# Patient Record
Sex: Male | Born: 1940 | Race: White | Hispanic: No | Marital: Married | Smoking: Never smoker
Health system: Southern US, Community
[De-identification: ages and names within clinical notes are randomized; demographics above are authoritative.]

## PROBLEM LIST (undated history)

## (undated) DIAGNOSIS — I10 Essential (primary) hypertension: Secondary | ICD-10-CM

## (undated) DIAGNOSIS — I4891 Unspecified atrial fibrillation: Secondary | ICD-10-CM

## (undated) DIAGNOSIS — G20C Parkinsonism, unspecified: Secondary | ICD-10-CM

## (undated) DIAGNOSIS — L130 Dermatitis herpetiformis: Secondary | ICD-10-CM

## (undated) DIAGNOSIS — R1310 Dysphagia, unspecified: Secondary | ICD-10-CM

## (undated) DIAGNOSIS — E538 Deficiency of other specified B group vitamins: Secondary | ICD-10-CM

## (undated) DIAGNOSIS — I2510 Atherosclerotic heart disease of native coronary artery without angina pectoris: Secondary | ICD-10-CM

## (undated) DIAGNOSIS — Z950 Presence of cardiac pacemaker: Secondary | ICD-10-CM

## (undated) DIAGNOSIS — G20 Parkinson's disease: Secondary | ICD-10-CM

## (undated) DIAGNOSIS — C61 Malignant neoplasm of prostate: Secondary | ICD-10-CM

## (undated) HISTORY — PX: PACEMAKER PLACEMENT: SHX43

## (undated) HISTORY — PX: CARDIAC CATHETERIZATION: SHX172

## (undated) HISTORY — PX: APPENDECTOMY: SHX54

## (undated) HISTORY — PX: COLONOSCOPY: SHX5424

---

## 2004-01-23 HISTORY — PX: PACEMAKER PLACEMENT: SHX43

## 2004-07-04 DIAGNOSIS — Z950 Presence of cardiac pacemaker: Secondary | ICD-10-CM | POA: Insufficient documentation

## 2004-09-12 ENCOUNTER — Ambulatory Visit: Payer: Self-pay | Admitting: Cardiology

## 2004-10-04 ENCOUNTER — Other Ambulatory Visit: Payer: Self-pay

## 2004-10-04 ENCOUNTER — Ambulatory Visit: Payer: Self-pay | Admitting: Cardiology

## 2006-04-25 ENCOUNTER — Ambulatory Visit: Payer: Self-pay | Admitting: Gastroenterology

## 2008-02-09 ENCOUNTER — Ambulatory Visit: Payer: Self-pay

## 2008-03-18 ENCOUNTER — Ambulatory Visit: Payer: Self-pay | Admitting: Unknown Physician Specialty

## 2008-04-02 ENCOUNTER — Ambulatory Visit: Payer: Self-pay | Admitting: Unknown Physician Specialty

## 2008-04-07 ENCOUNTER — Ambulatory Visit: Payer: Self-pay | Admitting: Unknown Physician Specialty

## 2010-01-03 ENCOUNTER — Ambulatory Visit: Payer: Self-pay | Admitting: General Surgery

## 2010-01-10 ENCOUNTER — Ambulatory Visit: Payer: Self-pay | Admitting: General Surgery

## 2011-08-08 ENCOUNTER — Ambulatory Visit: Payer: Self-pay | Admitting: Urology

## 2011-08-13 ENCOUNTER — Ambulatory Visit: Payer: Self-pay | Admitting: Urology

## 2012-01-23 DIAGNOSIS — C61 Malignant neoplasm of prostate: Secondary | ICD-10-CM

## 2012-01-23 HISTORY — DX: Malignant neoplasm of prostate: C61

## 2012-02-12 ENCOUNTER — Ambulatory Visit: Payer: Self-pay | Admitting: Gastroenterology

## 2012-02-12 LAB — PROTIME-INR: INR: 1

## 2012-02-14 ENCOUNTER — Ambulatory Visit: Payer: Self-pay | Admitting: Cardiology

## 2012-02-14 DIAGNOSIS — I4891 Unspecified atrial fibrillation: Secondary | ICD-10-CM

## 2012-02-14 LAB — CBC WITH DIFFERENTIAL/PLATELET
Basophil #: 0.1 10*3/uL (ref 0.0–0.1)
Basophil %: 0.9 %
Eosinophil #: 0.1 10*3/uL (ref 0.0–0.7)
Eosinophil %: 1.8 %
Lymphocyte #: 1.6 10*3/uL (ref 1.0–3.6)
Lymphocyte %: 22.6 %
MCH: 29.3 pg (ref 26.0–34.0)
MCHC: 33.9 g/dL (ref 32.0–36.0)
MCV: 86 fL (ref 80–100)
Monocyte #: 0.6 x10 3/mm (ref 0.2–1.0)
Neutrophil %: 65.9 %
RDW: 13.8 % (ref 11.5–14.5)
WBC: 6.9 10*3/uL (ref 3.8–10.6)

## 2012-02-14 LAB — APTT: Activated PTT: 31.1 secs (ref 23.6–35.9)

## 2012-02-14 LAB — BASIC METABOLIC PANEL
BUN: 16 mg/dL (ref 7–18)
Calcium, Total: 8.7 mg/dL (ref 8.5–10.1)
Chloride: 107 mmol/L (ref 98–107)
Glucose: 86 mg/dL (ref 65–99)
Osmolality: 280 (ref 275–301)
Sodium: 140 mmol/L (ref 136–145)

## 2012-02-14 LAB — PROTIME-INR
INR: 1.1
Prothrombin Time: 14.2 secs (ref 11.5–14.7)

## 2012-02-18 ENCOUNTER — Ambulatory Visit: Payer: Self-pay | Admitting: Cardiology

## 2012-09-10 ENCOUNTER — Encounter: Payer: Self-pay | Admitting: Neurology

## 2012-09-22 ENCOUNTER — Encounter: Payer: Self-pay | Admitting: Neurology

## 2012-10-22 ENCOUNTER — Encounter: Payer: Self-pay | Admitting: Neurology

## 2013-04-27 DIAGNOSIS — I4891 Unspecified atrial fibrillation: Secondary | ICD-10-CM | POA: Diagnosis present

## 2014-02-02 DIAGNOSIS — D400 Neoplasm of uncertain behavior of prostate: Secondary | ICD-10-CM | POA: Diagnosis not present

## 2014-02-02 DIAGNOSIS — C61 Malignant neoplasm of prostate: Secondary | ICD-10-CM | POA: Diagnosis not present

## 2014-02-03 DIAGNOSIS — C61 Malignant neoplasm of prostate: Secondary | ICD-10-CM | POA: Diagnosis not present

## 2014-02-10 DIAGNOSIS — E538 Deficiency of other specified B group vitamins: Secondary | ICD-10-CM | POA: Diagnosis not present

## 2014-03-15 DIAGNOSIS — E538 Deficiency of other specified B group vitamins: Secondary | ICD-10-CM | POA: Diagnosis not present

## 2014-04-06 DIAGNOSIS — I10 Essential (primary) hypertension: Secondary | ICD-10-CM | POA: Diagnosis not present

## 2014-04-06 DIAGNOSIS — H25813 Combined forms of age-related cataract, bilateral: Secondary | ICD-10-CM | POA: Diagnosis not present

## 2014-04-06 DIAGNOSIS — H4011X1 Primary open-angle glaucoma, mild stage: Secondary | ICD-10-CM | POA: Diagnosis not present

## 2014-04-13 DIAGNOSIS — E538 Deficiency of other specified B group vitamins: Secondary | ICD-10-CM | POA: Diagnosis not present

## 2014-05-03 DIAGNOSIS — Z0000 Encounter for general adult medical examination without abnormal findings: Secondary | ICD-10-CM | POA: Diagnosis not present

## 2014-05-03 DIAGNOSIS — E538 Deficiency of other specified B group vitamins: Secondary | ICD-10-CM | POA: Diagnosis not present

## 2014-05-03 DIAGNOSIS — I480 Paroxysmal atrial fibrillation: Secondary | ICD-10-CM | POA: Diagnosis not present

## 2014-05-03 DIAGNOSIS — I2510 Atherosclerotic heart disease of native coronary artery without angina pectoris: Secondary | ICD-10-CM | POA: Diagnosis not present

## 2014-05-04 DIAGNOSIS — I2510 Atherosclerotic heart disease of native coronary artery without angina pectoris: Secondary | ICD-10-CM | POA: Diagnosis not present

## 2014-05-04 DIAGNOSIS — I480 Paroxysmal atrial fibrillation: Secondary | ICD-10-CM | POA: Diagnosis not present

## 2014-05-04 DIAGNOSIS — E538 Deficiency of other specified B group vitamins: Secondary | ICD-10-CM | POA: Diagnosis not present

## 2014-05-04 DIAGNOSIS — Z0000 Encounter for general adult medical examination without abnormal findings: Secondary | ICD-10-CM | POA: Diagnosis not present

## 2014-05-11 NOTE — Op Note (Signed)
PATIENT NAME:  Daniel [name], Daniel [name] MR#:  [id number] DATE OF BIRTH:  09/30/1940  DATE OF PROCEDURE:  08/13/2011  PREOPERATIVE DIAGNOSIS: Benign prostatic hypertrophy with bladder outlet obstruction.   POSTOPERATIVE DIAGNOSIS: Benign prostatic hypertrophy with bladder outlet obstruction.   PROCEDURE PERFORMED: Photovaporization of the prostate with the GreenLight laser.   SURGEON: Otelia Limes. Yves Dill, MD  ANESTHETIST: [facility]  ANESTHESIA: General.   INDICATIONS: See the dictated history and physical. After informed consent patient requests above procedure.   OPERATIVE SUMMARY: After adequate general anesthesia had been obtained, patient was placed into dorsal lithotomy position and the perineum was prepped and draped in the usual fashion. The laser scope was coupled with the camera and then visually advanced into the bladder. Bladder was moderately trabeculated. No bladder tumors were identified. Patient was noted to have trilobar benign prostatic hypertrophy with visual obstruction. At this point the [facility] GreenLight laser fiber was introduced through the scope and vaporization was begun at a setting of 80 watts. The bladder neck tissue was fully vaporized. Power was then increased up to 120 watts and midportion of the prostatic tissue was vaporized to the level of the verumontanum. Finally the power was increased up to 180 watts and remaining obstructive tissue was vaporized. At this point scope was removed and a 20 [place] Foley catheter was inserted. Catheter was irrigated until clear. A B and O suppository was placed. Procedure was then terminated and the patient was transferred to the recovery room in stable condition.  ____________________________ Otelia Limes. Yves Dill, MD mrw:cms D: 08/13/2011 14:07:13 ET T: 08/13/2011 14:19:45 ET  JOB#: [id number] cc: Otelia Limes. Yves Dill, MD, <Dictator>  Royston Cowper MD ELECTRONICALLY SIGNED 08/13/2011 17:56

## 2014-05-11 NOTE — H&P (Signed)
PATIENT NAME:  Daniel [name], Daniel [name] MR#:  [id number] DATE OF BIRTH:  Apr 19, 1940  DATE OF ADMISSION:  08/13/2011  CHIEF COMPLAINT: Difficulty voiding.    HISTORY OF PRESENT ILLNESS: Daniel [name] is a 74 year old Caucasian male with a long history of benign prostatic hypertrophy and lower urinary tract symptoms which have worsened recently. AUA symptom score 16 with a quality of life score of 4. He was found to have an elevated PSA of 4.85 and subsequently underwent ultrasound-guided needle biopsy of the prostate June 20th and was found to have an 84 gram prostate with Gleason's grade 3 + 3 adenocarcinoma involving 2 out of 12 core biopsies. He comes in now for photovaporization of the prostate with the green light laser.   ALLERGIES: No drug allergies.   CURRENT MEDICATIONS:  1. Dapsone. 2. Metoprolol. 3. Aspirin.  4. Jalyn.   PAST SURGICAL HISTORY:  1. Appendectomy in 1961. 2. Repair of a deviated septum in 1977. 3. Pacemaker placement in 2006.  4. Left shoulder injury repair in 2009. 5. Bilateral inguinal herniorrhaphies in 2011.   SOCIAL HISTORY: He denied tobacco use. He consumes 1 to 4 alcoholic beverages per week.   FAMILY HISTORY: Noncontributory.   PAST AND CURRENT MEDICAL CONDITION.  1. Hypertension. 2. Dermatitis herpetiformis. 3. Atrial fibrillation status post pacemaker placement. 4. Coronary artery disease.   REVIEW OF SYSTEMS: The patient denies chest pain, shortness of breath, diabetes, or stroke.   PHYSICAL EXAMINATION:   GENERAL: Well nourished white male in no acute distress.   HEENT: Sclerae were clear. Pupils were equal, round, reactive to light and accommodation. Extraocular movements were intact.   NECK: Supple. No palpable adenopathy. No audible carotid bruits.   LUNGS: Clear to auscultation.   HEART: Regular rhythm and rate without audible murmurs.   ABDOMEN: Soft, nontender abdomen.   GENITOURINARY: Circumcised. Testes were atrophic.   RECTAL: Greater  than 75 gram smooth nontender prostate.   NEUROMUSCULAR: Nonfocal.   IMPRESSION:  1. Bladder outlet obstruction.  2. Focal prostate cancer.   PLAN: Photovaporization of prostate with green light laser.   ____________________________ Daniel [name]. Yves Dill, MD mrw:drc D: 08/08/2011 13:08:08 ET T: 08/08/2011 13:22:44 ET JOB#: [id number]  cc: Daniel [name]. Yves Dill, MD, <Dictator> Royston Cowper MD ELECTRONICALLY SIGNED 08/08/2011 16:58

## 2014-05-14 NOTE — Op Note (Signed)
PATIENT NAME:  TALIK, CASIQUE MR#:  [id number] DATE OF BIRTH:  Nov 03, 1940  DATE OF PROCEDURE:  02/18/2012  PRIMARY CARE PHYSICIAN: Maryland Pink, MD  PREPROCEDURE DIAGNOSES:  1. Sick sinus syndrome. 2. Elective replacement indication.  PROCEDURE: Dual-chamber pacemaker generator change-out.  POSTPROCEDURE DIAGNOSIS: Intermittent ventricular pacing.   INDICATION: The patient is a 74 year old gentleman status post dual-chamber pacemaker 10/04/2004 for sick sinus syndrome. Recent pacemaker interrogation showed pacemaker was elective replacement indication.   DESCRIPTION OF PROCEDURE: The risks, benefits and alternatives of pacemaker generator change-out were explained to the patient and informed written consent was obtained. He was brought to the operating room in a fasting state. The left pectoral region was prepped and draped in the usual sterile manner. Anesthesia was obtain with 1% Xylocaine locally. A 6 cm incision was performed over the left pectoral region. The old pacemaker generator was retrieved by electrocautery and blunt dissection. The leads were disconnected to the old pacemaker generator and interrogated. After proper thresholds were obtained, the leads were connected to a new dual-chamber rate-responsive pacemaker generator (Adapta [id number]). The pacemaker pocket was irrigated with gentamicin solution. The pacemaker pocket was closed with 2-0 and 4-0 Vicryl, respectively. Steri-Strips and pressure dressing were applied. ____________________________ Isaias Cowman, MD ap:sb D: 02/18/2012 12:58:06 ET T: 02/18/2012 14:00:05 ET JOB#: [id number]  cc: Isaias Cowman, MD, <Dictator> Isaias Cowman MD ELECTRONICALLY SIGNED 03/18/2012 14:57

## 2014-06-01 DIAGNOSIS — I471 Supraventricular tachycardia: Secondary | ICD-10-CM | POA: Diagnosis not present

## 2014-06-30 DIAGNOSIS — Z950 Presence of cardiac pacemaker: Secondary | ICD-10-CM | POA: Diagnosis not present

## 2014-06-30 DIAGNOSIS — I2510 Atherosclerotic heart disease of native coronary artery without angina pectoris: Secondary | ICD-10-CM | POA: Diagnosis not present

## 2014-06-30 DIAGNOSIS — Z45018 Encounter for adjustment and management of other part of cardiac pacemaker: Secondary | ICD-10-CM | POA: Diagnosis not present

## 2014-06-30 DIAGNOSIS — I480 Paroxysmal atrial fibrillation: Secondary | ICD-10-CM | POA: Diagnosis not present

## 2014-07-05 DIAGNOSIS — H25813 Combined forms of age-related cataract, bilateral: Secondary | ICD-10-CM | POA: Diagnosis not present

## 2014-07-05 DIAGNOSIS — H52223 Regular astigmatism, bilateral: Secondary | ICD-10-CM | POA: Diagnosis not present

## 2014-07-05 DIAGNOSIS — H4011X1 Primary open-angle glaucoma, mild stage: Secondary | ICD-10-CM | POA: Diagnosis not present

## 2014-07-05 DIAGNOSIS — H524 Presbyopia: Secondary | ICD-10-CM | POA: Diagnosis not present

## 2014-07-05 DIAGNOSIS — I10 Essential (primary) hypertension: Secondary | ICD-10-CM | POA: Diagnosis not present

## 2014-07-05 DIAGNOSIS — H5213 Myopia, bilateral: Secondary | ICD-10-CM | POA: Diagnosis not present

## 2014-08-10 DIAGNOSIS — R351 Nocturia: Secondary | ICD-10-CM | POA: Diagnosis not present

## 2014-08-10 DIAGNOSIS — C61 Malignant neoplasm of prostate: Secondary | ICD-10-CM | POA: Diagnosis not present

## 2014-08-10 DIAGNOSIS — D400 Neoplasm of uncertain behavior of prostate: Secondary | ICD-10-CM | POA: Diagnosis not present

## 2014-08-11 DIAGNOSIS — R17 Unspecified jaundice: Secondary | ICD-10-CM | POA: Diagnosis not present

## 2014-09-29 DIAGNOSIS — X32XXXA Exposure to sunlight, initial encounter: Secondary | ICD-10-CM | POA: Diagnosis not present

## 2014-09-29 DIAGNOSIS — R202 Paresthesia of skin: Secondary | ICD-10-CM | POA: Diagnosis not present

## 2014-09-29 DIAGNOSIS — D485 Neoplasm of uncertain behavior of skin: Secondary | ICD-10-CM | POA: Diagnosis not present

## 2014-09-29 DIAGNOSIS — L570 Actinic keratosis: Secondary | ICD-10-CM | POA: Diagnosis not present

## 2014-09-29 DIAGNOSIS — L130 Dermatitis herpetiformis: Secondary | ICD-10-CM | POA: Diagnosis not present

## 2014-09-29 DIAGNOSIS — C44319 Basal cell carcinoma of skin of other parts of face: Secondary | ICD-10-CM | POA: Diagnosis not present

## 2014-09-29 DIAGNOSIS — Z79899 Other long term (current) drug therapy: Secondary | ICD-10-CM | POA: Diagnosis not present

## 2014-10-05 DIAGNOSIS — H5213 Myopia, bilateral: Secondary | ICD-10-CM | POA: Diagnosis not present

## 2014-10-05 DIAGNOSIS — H52223 Regular astigmatism, bilateral: Secondary | ICD-10-CM | POA: Diagnosis not present

## 2014-10-05 DIAGNOSIS — H524 Presbyopia: Secondary | ICD-10-CM | POA: Diagnosis not present

## 2014-10-05 DIAGNOSIS — I10 Essential (primary) hypertension: Secondary | ICD-10-CM | POA: Diagnosis not present

## 2014-10-05 DIAGNOSIS — H4011X1 Primary open-angle glaucoma, mild stage: Secondary | ICD-10-CM | POA: Diagnosis not present

## 2014-10-05 DIAGNOSIS — H25813 Combined forms of age-related cataract, bilateral: Secondary | ICD-10-CM | POA: Diagnosis not present

## 2014-11-22 DIAGNOSIS — C44319 Basal cell carcinoma of skin of other parts of face: Secondary | ICD-10-CM | POA: Diagnosis not present

## 2014-11-30 DIAGNOSIS — I471 Supraventricular tachycardia: Secondary | ICD-10-CM | POA: Diagnosis not present

## 2014-12-29 DIAGNOSIS — Z9889 Other specified postprocedural states: Secondary | ICD-10-CM | POA: Diagnosis not present

## 2014-12-29 DIAGNOSIS — I480 Paroxysmal atrial fibrillation: Secondary | ICD-10-CM | POA: Diagnosis not present

## 2014-12-29 DIAGNOSIS — Z950 Presence of cardiac pacemaker: Secondary | ICD-10-CM | POA: Diagnosis not present

## 2014-12-29 DIAGNOSIS — Z45018 Encounter for adjustment and management of other part of cardiac pacemaker: Secondary | ICD-10-CM | POA: Diagnosis not present

## 2015-01-03 DIAGNOSIS — H52223 Regular astigmatism, bilateral: Secondary | ICD-10-CM | POA: Diagnosis not present

## 2015-01-03 DIAGNOSIS — I10 Essential (primary) hypertension: Secondary | ICD-10-CM | POA: Diagnosis not present

## 2015-01-03 DIAGNOSIS — H25813 Combined forms of age-related cataract, bilateral: Secondary | ICD-10-CM | POA: Diagnosis not present

## 2015-01-03 DIAGNOSIS — H401131 Primary open-angle glaucoma, bilateral, mild stage: Secondary | ICD-10-CM | POA: Diagnosis not present

## 2015-01-03 DIAGNOSIS — H5213 Myopia, bilateral: Secondary | ICD-10-CM | POA: Diagnosis not present

## 2015-01-03 DIAGNOSIS — H524 Presbyopia: Secondary | ICD-10-CM | POA: Diagnosis not present

## 2015-02-22 DIAGNOSIS — L300 Nummular dermatitis: Secondary | ICD-10-CM | POA: Diagnosis not present

## 2015-02-22 DIAGNOSIS — L130 Dermatitis herpetiformis: Secondary | ICD-10-CM | POA: Diagnosis not present

## 2015-02-22 DIAGNOSIS — L821 Other seborrheic keratosis: Secondary | ICD-10-CM | POA: Diagnosis not present

## 2015-04-04 DIAGNOSIS — H524 Presbyopia: Secondary | ICD-10-CM | POA: Diagnosis not present

## 2015-04-04 DIAGNOSIS — I10 Essential (primary) hypertension: Secondary | ICD-10-CM | POA: Diagnosis not present

## 2015-04-04 DIAGNOSIS — H5213 Myopia, bilateral: Secondary | ICD-10-CM | POA: Diagnosis not present

## 2015-04-04 DIAGNOSIS — H25813 Combined forms of age-related cataract, bilateral: Secondary | ICD-10-CM | POA: Diagnosis not present

## 2015-04-04 DIAGNOSIS — H401131 Primary open-angle glaucoma, bilateral, mild stage: Secondary | ICD-10-CM | POA: Diagnosis not present

## 2015-04-04 DIAGNOSIS — H52223 Regular astigmatism, bilateral: Secondary | ICD-10-CM | POA: Diagnosis not present

## 2015-05-31 DIAGNOSIS — I471 Supraventricular tachycardia: Secondary | ICD-10-CM | POA: Diagnosis not present

## 2015-06-14 ENCOUNTER — Other Ambulatory Visit: Payer: Self-pay | Admitting: Family Medicine

## 2015-06-14 DIAGNOSIS — E538 Deficiency of other specified B group vitamins: Secondary | ICD-10-CM | POA: Diagnosis not present

## 2015-06-14 DIAGNOSIS — Z0000 Encounter for general adult medical examination without abnormal findings: Secondary | ICD-10-CM | POA: Diagnosis not present

## 2015-06-14 DIAGNOSIS — R1310 Dysphagia, unspecified: Secondary | ICD-10-CM

## 2015-06-14 DIAGNOSIS — I4891 Unspecified atrial fibrillation: Secondary | ICD-10-CM | POA: Diagnosis not present

## 2015-06-15 DIAGNOSIS — Z0000 Encounter for general adult medical examination without abnormal findings: Secondary | ICD-10-CM | POA: Diagnosis not present

## 2015-06-15 DIAGNOSIS — E538 Deficiency of other specified B group vitamins: Secondary | ICD-10-CM | POA: Diagnosis not present

## 2015-06-15 DIAGNOSIS — I4891 Unspecified atrial fibrillation: Secondary | ICD-10-CM | POA: Diagnosis not present

## 2015-06-22 ENCOUNTER — Ambulatory Visit
Admission: RE | Admit: 2015-06-22 | Discharge: 2015-06-22 | Disposition: A | Discharge status: Home / Self Care | Payer: Commercial Managed Care - HMO | Source: Ambulatory Visit | Attending: Family Medicine | Admitting: Family Medicine

## 2015-06-22 DIAGNOSIS — R1310 Dysphagia, unspecified: Secondary | ICD-10-CM | POA: Insufficient documentation

## 2015-06-24 DIAGNOSIS — E538 Deficiency of other specified B group vitamins: Secondary | ICD-10-CM | POA: Diagnosis not present

## 2015-07-04 DIAGNOSIS — E538 Deficiency of other specified B group vitamins: Secondary | ICD-10-CM | POA: Diagnosis not present

## 2015-07-05 DIAGNOSIS — I480 Paroxysmal atrial fibrillation: Secondary | ICD-10-CM | POA: Diagnosis not present

## 2015-07-05 DIAGNOSIS — Z9889 Other specified postprocedural states: Secondary | ICD-10-CM | POA: Diagnosis not present

## 2015-07-05 DIAGNOSIS — Z950 Presence of cardiac pacemaker: Secondary | ICD-10-CM | POA: Diagnosis not present

## 2015-07-05 DIAGNOSIS — Z45018 Encounter for adjustment and management of other part of cardiac pacemaker: Secondary | ICD-10-CM | POA: Diagnosis not present

## 2015-07-06 DIAGNOSIS — R1310 Dysphagia, unspecified: Secondary | ICD-10-CM | POA: Diagnosis not present

## 2015-07-06 DIAGNOSIS — R490 Dysphonia: Secondary | ICD-10-CM | POA: Diagnosis not present

## 2015-07-11 DIAGNOSIS — I10 Essential (primary) hypertension: Secondary | ICD-10-CM | POA: Diagnosis not present

## 2015-07-11 DIAGNOSIS — E538 Deficiency of other specified B group vitamins: Secondary | ICD-10-CM | POA: Diagnosis not present

## 2015-07-11 DIAGNOSIS — H52223 Regular astigmatism, bilateral: Secondary | ICD-10-CM | POA: Diagnosis not present

## 2015-07-11 DIAGNOSIS — H524 Presbyopia: Secondary | ICD-10-CM | POA: Diagnosis not present

## 2015-07-11 DIAGNOSIS — H401131 Primary open-angle glaucoma, bilateral, mild stage: Secondary | ICD-10-CM | POA: Diagnosis not present

## 2015-07-11 DIAGNOSIS — H5213 Myopia, bilateral: Secondary | ICD-10-CM | POA: Diagnosis not present

## 2015-07-11 DIAGNOSIS — H25813 Combined forms of age-related cataract, bilateral: Secondary | ICD-10-CM | POA: Diagnosis not present

## 2015-07-12 ENCOUNTER — Other Ambulatory Visit: Payer: Self-pay | Admitting: Unknown Physician Specialty

## 2015-07-12 DIAGNOSIS — R1310 Dysphagia, unspecified: Secondary | ICD-10-CM

## 2015-07-12 DIAGNOSIS — R1313 Dysphagia, pharyngeal phase: Secondary | ICD-10-CM | POA: Diagnosis not present

## 2015-07-12 DIAGNOSIS — L130 Dermatitis herpetiformis: Secondary | ICD-10-CM | POA: Diagnosis not present

## 2015-07-18 DIAGNOSIS — E538 Deficiency of other specified B group vitamins: Secondary | ICD-10-CM | POA: Diagnosis not present

## 2015-07-22 ENCOUNTER — Ambulatory Visit
Admission: RE | Admit: 2015-07-22 | Discharge: 2015-07-22 | Disposition: A | Discharge status: Home / Self Care | Payer: Commercial Managed Care - HMO | Source: Ambulatory Visit | Attending: Unknown Physician Specialty | Admitting: Unknown Physician Specialty

## 2015-07-22 DIAGNOSIS — R1310 Dysphagia, unspecified: Secondary | ICD-10-CM

## 2015-07-22 DIAGNOSIS — R1313 Dysphagia, pharyngeal phase: Secondary | ICD-10-CM

## 2015-07-22 NOTE — Therapy (Signed)
[place], [place], [address] Phone: [phone]   Fax:     Modified Barium Swallow  Patient Details  Name: Daniel [name] MRN: [id number] Date of Birth: [birth year] No Data Recorded  Encounter Date: 07/22/2015      End of Session - 07/22/15 1418    Visit Number 1   Number of Visits 1   Date for SLP Re-Evaluation 07/22/15   SLP Start Time 19   SLP Stop Time  1400   SLP Time Calculation (min) 60 min   Activity Tolerance Patient tolerated treatment well      No past medical history on file.  No past surgical history on file.  There were no vitals filed for this visit.   Subjective: Patient behavior: (alertness, ability to follow instructions, etc.): Patient alert, verbal and able to follow directions  Chief complaint: recent barium swallow study positive for laryngeal penetration, report of occasional nasal regurgitation, report of bowed vocal cords, and no known neurological history   Objective:  Radiological Procedure: A videoflouroscopic evaluation of oral-preparatory, reflex initiation, and pharyngeal phases of the swallow was performed; as well as a screening of the upper esophageal phase.  I. POSTURE: Upright in MBS chair  II. VIEW: Lateral  III. COMPENSATORY STRATEGIES: dry swallow, liquid wash, head turn  IV. BOLUSES ADMINISTERED:   Thin Liquid: 2 small sips, 3 rapid consecutive sips   Nectar-thick Liquid: 1 moderate size bolus    Puree: 2 teaspoon presentations   Mechanical Soft: 1/4 graham cracker in applesauce  V. RESULTS OF EVALUATION: A. ORAL PREPARATORY PHASE: (The lips, tongue, and velum are observed for strength and coordination)       **Overall Severity Rating: Within normal limits  B. SWALLOW INITIATION/REFLEX: (The reflex is normal if "triggered" by the time the bolus reached the base of the tongue)  **Overall Severity Rating: Mild; triggers at the  valleculae  C. PHARYNGEAL PHASE: (Pharyngeal function is normal if the bolus shows rapid, smooth, and continuous transit through the pharynx and there is no pharyngeal residue after the swallow)  **Overall Severity Rating: Moderate; decreased anterior hyolaryngeal movement, incomplete epiglottic inversion, and moderate pharyngeal residue (primarily valleculae with minimal residue in the pyriform sinus).   D. LARYNGEAL PENETRATION: (Material entering into the laryngeal inlet/vestibule but not aspirated): None   E. ASPIRATION: None  F. ESOPHAGEAL PHASE: (Screening of the upper esophagus): patient had barium swallow study 06/22/2015  ASSESSMENT: This 75 year old man; with recent barium swallow study positive for laryngeal penetration, report of occasional nasal regurgitation, report of bowed vocal cords, and no known neurological history; is presenting with moderate pharyngeal dysphagia characterized decreased anterior hyolaryngeal movement, incomplete epiglottic inversion, and moderate pharyngeal residue (primarily valleculae with minimal residue in the pyriform sinus). Spontaneous dry swallows and liquid wash reduce but do not clear the residue. The patient demonstrated good velopharyngeal closure during every swallow with no bolus between the soft palate and pharyngeal wall.  There was no observed laryngeal penetration / aspiration during this study; the patient demonstrates excellent airway protection.  The patient does not appear to be at significant risk for prandial aspiration.  The patient would benefit from speech therapy for laryngeal/pharyngeal strengthening exercises as well as high effort/high intensity vocal exercises (which may improve oral tongue / tongue base function during the oral and pharyngeal phases of swallowing as well as improving vocal intensity and reducing hypernasality).    PLAN/RECOMMENDATIONS:  A. Diet: Regular  B. Swallowing Precautions: Standard, monitor for increased  symptoms of oropharyngeal dysphagia / aspiration   C. Recommended consultation to: follow up with Dr. Tami Ribas as scheduled   D. Therapy recommendations: speech therapy for laryngeal/pharyngeal strengthening exercises as well as high effort/high intensity vocal exercises (which may improve oral tongue / tongue base function during the oral and pharyngeal phases of swallowing as well as improving vocal intensity and reducing hypernasality).    E. Results and recommendations were discussed with the patient immediately following the study and final report routed to Dr. Tami Ribas and [place] November, [place].    Patient will benefit from skilled therapeutic intervention in order to improve the following deficits and impairments:   Dysphagia, pharyngeal phase  Dysphagia - Plan: DG OP Swallowing Func-Medicare/Speech Path, DG OP Swallowing Func-Medicare/Speech Path      G-Codes - 07/23/15 1419    Functional Assessment Tool Used MBS, clinical judgment   Functional Limitations Swallowing   Swallow Current Status [id number]) At least 40 percent but less than 60 percent impaired, limited or restricted   Swallow Goal Status [id number]) At least 40 percent but less than 60 percent impaired, limited or restricted   Swallow Discharge Status [id number]) At least 40 percent but less than 60 percent impaired, limited or restricted          Problem List There are no active problems to display for this patient.  Leroy Sea, MS/CCC- SLP  Lou Miner July 23, 2015, 2:20 PM  [facility] [place], [place], [address] Phone: [phone]   Fax:     Name: Daniel [name] MRN: [id number] Date of Birth: Jan 03, 1941

## 2015-07-27 DIAGNOSIS — R490 Dysphonia: Secondary | ICD-10-CM | POA: Diagnosis not present

## 2015-07-27 DIAGNOSIS — R1310 Dysphagia, unspecified: Secondary | ICD-10-CM | POA: Diagnosis not present

## 2015-08-01 DIAGNOSIS — E538 Deficiency of other specified B group vitamins: Secondary | ICD-10-CM | POA: Diagnosis not present

## 2015-08-09 DIAGNOSIS — D400 Neoplasm of uncertain behavior of prostate: Secondary | ICD-10-CM | POA: Diagnosis not present

## 2015-08-09 DIAGNOSIS — R350 Frequency of micturition: Secondary | ICD-10-CM | POA: Diagnosis not present

## 2015-08-09 DIAGNOSIS — C61 Malignant neoplasm of prostate: Secondary | ICD-10-CM | POA: Diagnosis not present

## 2015-08-09 DIAGNOSIS — R351 Nocturia: Secondary | ICD-10-CM | POA: Diagnosis not present

## 2015-08-09 DIAGNOSIS — R3914 Feeling of incomplete bladder emptying: Secondary | ICD-10-CM | POA: Diagnosis not present

## 2015-08-15 DIAGNOSIS — E538 Deficiency of other specified B group vitamins: Secondary | ICD-10-CM | POA: Diagnosis not present

## 2015-10-12 ENCOUNTER — Encounter: Payer: Self-pay | Admitting: *Deleted

## 2015-10-13 ENCOUNTER — Ambulatory Visit: Payer: Commercial Managed Care - HMO | Admitting: Anesthesiology

## 2015-10-13 ENCOUNTER — Encounter
Admission: RE | Disposition: A | Discharge status: Home / Self Care | Payer: Self-pay | Source: Ambulatory Visit | Attending: Gastroenterology

## 2015-10-13 ENCOUNTER — Encounter: Payer: Self-pay | Admitting: *Deleted

## 2015-10-13 ENCOUNTER — Ambulatory Visit
Admission: RE | Admit: 2015-10-13 | Discharge: 2015-10-13 | Disposition: A | Discharge status: Home / Self Care | Payer: Commercial Managed Care - HMO | Source: Ambulatory Visit | Attending: Gastroenterology | Admitting: Gastroenterology

## 2015-10-13 DIAGNOSIS — E538 Deficiency of other specified B group vitamins: Secondary | ICD-10-CM | POA: Insufficient documentation

## 2015-10-13 DIAGNOSIS — Z7982 Long term (current) use of aspirin: Secondary | ICD-10-CM | POA: Diagnosis not present

## 2015-10-13 DIAGNOSIS — Z79899 Other long term (current) drug therapy: Secondary | ICD-10-CM | POA: Insufficient documentation

## 2015-10-13 DIAGNOSIS — R1310 Dysphagia, unspecified: Secondary | ICD-10-CM | POA: Diagnosis not present

## 2015-10-13 DIAGNOSIS — K210 Gastro-esophageal reflux disease with esophagitis: Secondary | ICD-10-CM | POA: Diagnosis not present

## 2015-10-13 DIAGNOSIS — K2940 Chronic atrophic gastritis without bleeding: Secondary | ICD-10-CM | POA: Diagnosis not present

## 2015-10-13 DIAGNOSIS — K2980 Duodenitis without bleeding: Secondary | ICD-10-CM | POA: Diagnosis not present

## 2015-10-13 DIAGNOSIS — K228 Other specified diseases of esophagus: Secondary | ICD-10-CM | POA: Insufficient documentation

## 2015-10-13 DIAGNOSIS — L130 Dermatitis herpetiformis: Secondary | ICD-10-CM | POA: Diagnosis not present

## 2015-10-13 DIAGNOSIS — K3189 Other diseases of stomach and duodenum: Secondary | ICD-10-CM | POA: Diagnosis not present

## 2015-10-13 DIAGNOSIS — Z950 Presence of cardiac pacemaker: Secondary | ICD-10-CM | POA: Insufficient documentation

## 2015-10-13 DIAGNOSIS — I4891 Unspecified atrial fibrillation: Secondary | ICD-10-CM | POA: Diagnosis not present

## 2015-10-13 DIAGNOSIS — K2970 Gastritis, unspecified, without bleeding: Secondary | ICD-10-CM | POA: Diagnosis not present

## 2015-10-13 HISTORY — DX: Unspecified atrial fibrillation: I48.91

## 2015-10-13 HISTORY — PX: ESOPHAGOGASTRODUODENOSCOPY (EGD) WITH PROPOFOL: SHX5813

## 2015-10-13 HISTORY — DX: Deficiency of other specified B group vitamins: E53.8

## 2015-10-13 HISTORY — DX: Dermatitis herpetiformis: L13.0

## 2015-10-13 HISTORY — DX: Dysphagia, unspecified: R13.10

## 2015-10-13 SURGERY — ESOPHAGOGASTRODUODENOSCOPY (EGD) WITH PROPOFOL
Anesthesia: General

## 2015-10-13 MED ORDER — MIDAZOLAM HCL 5 MG/5ML IJ SOLN
INTRAMUSCULAR | Status: DC | PRN
  Administered 2015-10-13: 1 mg via INTRAVENOUS

## 2015-10-13 MED ORDER — LIDOCAINE 2% (20 MG/ML) 5 ML SYRINGE
INTRAMUSCULAR | Status: DC | PRN
  Administered 2015-10-13: 40 mg via INTRAVENOUS

## 2015-10-13 MED ORDER — FENTANYL CITRATE (PF) 100 MCG/2ML IJ SOLN
INTRAMUSCULAR | Status: DC | PRN
  Administered 2015-10-13: 50 ug via INTRAVENOUS

## 2015-10-13 MED ORDER — SODIUM CHLORIDE 0.9 % IV SOLN
INTRAVENOUS | Status: DC
  Administered 2015-10-13: 1000 mL via INTRAVENOUS
  Administered 2015-10-13: 07:00:00 via INTRAVENOUS

## 2015-10-13 MED ORDER — PROPOFOL 500 MG/50ML IV EMUL
INTRAVENOUS | Status: DC | PRN
  Administered 2015-10-13: 140 ug/kg/min via INTRAVENOUS

## 2015-10-13 MED ORDER — SODIUM CHLORIDE 0.9 % IV SOLN: INTRAVENOUS | Status: DC

## 2015-10-13 MED ORDER — PROPOFOL 10 MG/ML IV BOLUS
INTRAVENOUS | Status: DC | PRN
  Administered 2015-10-13: 80 mg via INTRAVENOUS

## 2015-10-13 MED ORDER — PHENYLEPHRINE HCL 10 MG/ML IJ SOLN
INTRAMUSCULAR | Status: DC | PRN
  Administered 2015-10-13: 100 ug via INTRAVENOUS

## 2015-10-13 NOTE — H&P (Signed)
Outpatient short stay form Pre-procedure 10/13/2015 7:54 AM [name] MD  Primary Physician: Dr. Maryland Pink  Reason for visit:  EGD  History of present illness:  Patient is a 75 year old male presenting today for further evaluation in regards to his positive celiac panel. He also has been having a issue with dysphagia for about 6 months. Evaluation of the distal patient so far shown oropharyngeal type dysphagia with incomplete closure of the epiglottis. There is some barium leakage. He has had both a standard barium swallow as well as a modified barium swallow this showing a normal cervical and thoracic esophagus. Barium tablet passes without hesitation. He has also however been shown to have a bowing of his vocal cords on ENT evaluation. There is some incoordination and the first aspects of swallowing. He denies any significant reflux. He does not regurgitate foods.    Current Facility-Administered Medications:  .  0.9 %  sodium chloride infusion, , Intravenous, Continuous, [name], MD, Last Rate: 20 mL/hr at 10/13/15 0727, 1,000 mL at 10/13/15 0727 .  0.9 %  sodium chloride infusion, , Intravenous, Continuous, [name], MD  Prescriptions Prior to Admission  Medication Sig Dispense Refill Last Dose  . aspirin 81 MG chewable tablet Chew 81 mg by mouth once.     . chlorhexidine (PERIDEX) 0.12 % solution Use as directed 15 mLs in the mouth or throat 2 (two) times daily.     . cyanocobalamin 1000 MCG tablet Take 1,000 mcg by mouth daily.     . dapsone 25 MG tablet Take 25 mg by mouth daily.     Marland Kitchen latanoprost (XALATAN) 0.005 % ophthalmic solution 1 drop at bedtime.     . metoprolol succinate (TOPROL-XL) 25 MG 24 hr tablet Take 25 mg by mouth daily.   10/12/2015 at 1700     Not on File   Past Medical History:  Diagnosis Date  . AF (atrial fibrillation) ([place])   . B12 deficiency   . DH (dermatitis herpetiformis)   . Dysphagia     Review of systems:       Physical Exam    Heart and lungs: Regular rate and rhythm without rub or gallop, lungs are bilaterally clear.    HEENT: Normocephalic atraumatic eyes are anicteric    Other:     Pertinant exam for procedure: Soft nontender nondistended bowel sounds positive normoactive.    Planned proceedures: EGD and indicated procedures. I have discussed the risks benefits and complications of procedures to include not limited to bleeding, infection, perforation and the risk of sedation and the patient wishes to proceed.    [name], MD Gastroenterology 10/13/2015  7:54 AM

## 2015-10-13 NOTE — Op Note (Signed)
[facility] Gastroenterology Patient Name: Daniel [name] Procedure Date: 10/13/2015 7:52 AM MRN: [id number] Account #: [id number] Date of Birth: [birth year] Admit Type: Outpatient Age: 75 Room: [facility] ENDO ROOM 4 Gender: Male Note Status: Finalized Procedure:            Upper GI endoscopy Indications:          Dermatitis herpetiformis, , abnormal celiac panel Providers:            [name], MD Referring MD:         Irven Easterly. Kary Kos, MD (Referring MD) Medicines:            Monitored Anesthesia Care Complications:        No immediate complications. Procedure:            Pre-Anesthesia Assessment:                       - ASA Grade Assessment: III - A patient with severe                        systemic disease.                       After obtaining informed consent, the endoscope was                        passed under direct vision. Throughout the procedure,                        the patient's blood pressure, pulse, and oxygen                        saturations were monitored continuously. The Endoscope                        was introduced through the mouth, and advanced to the                        third part of duodenum. The patient tolerated the                        procedure well. The upper GI endoscopy was accomplished                        without difficulty. Findings:      The lumen of the cricopharyngeus was mildly dilated.      The Z-line was regular. Biopsies were taken with a cold forceps for       histology.      Diffuse and patchy mild inflammation characterized by atrophic       appearance was found in the gastric body. Biopsies were taken with a       cold forceps for histology.      A single 7 mm mucosal papule (nodule) with no bleeding and no stigmata       of recent bleeding was found on the greater curvature of the gastric       antrum. Biopsies were taken with a cold forceps for histology.      Diffuse and patchy mild mucosal  variance characterized by smoothness and       altered texture was found in the entire duodenum. Biopsies were taken  with a cold forceps for histology.      The cardia and gastric fundus were normal on retroflexion.      pyloric atonia Impression:           - Dilation at the cricopharyngeus.                       - Z-line regular. Biopsied.                       - Atrophic gastritis. Biopsied.                       - A single mucosal papule (nodule) found in the                        stomach. Biopsied.                       - Mucosal variant in the duodenum. Biopsied. Recommendation:       - Await pathology results.                       - Refer to neurologist at appointment to be scheduled. 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 All rights reserved. The codes documented in this report are preliminary and upon coder review may  be revised to meet current compliance requirements. [name], MD 10/13/2015 8:23:09 AM This report has been signed electronically. Number of Addenda: 0 Note Initiated On: 10/13/2015 7:52 AM      [facility]

## 2015-10-13 NOTE — Anesthesia Postprocedure Evaluation (Signed)
Anesthesia Post Note  Patient: Daniel [name]  Procedure(s) Performed: Procedure(s) (LRB): ESOPHAGOGASTRODUODENOSCOPY (EGD) WITH PROPOFOL (N/A)  Patient location during evaluation: PACU Anesthesia Type: General Level of consciousness: awake Pain management: pain level controlled Vital Signs Assessment: post-procedure vital signs reviewed and stable Respiratory status: spontaneous breathing Cardiovascular status: stable Anesthetic complications: no    Last Vitals:  Vitals:   10/13/15 0820 10/13/15 0821  BP: 102/63 102/63  Pulse:  74  Resp:  11  Temp: (!) 35.7 C (!) 35.7 C    Last Pain:  Vitals:   10/13/15 0711  TempSrc: Oral                 VAN STAVEREN,Caroline Longie

## 2015-10-13 NOTE — Transfer of Care (Signed)
Immediate Anesthesia Transfer of Care Note  Patient: Daniel [name]  Procedure(s) Performed: Procedure(s): ESOPHAGOGASTRODUODENOSCOPY (EGD) WITH PROPOFOL (N/A)  Patient Location: PACU and Endoscopy Unit  Anesthesia Type:General  Level of Consciousness: sedated  Airway & Oxygen Therapy: Patient Spontanous Breathing and Patient connected to nasal cannula oxygen  Post-op Assessment: Report given to RN and Post -op Vital signs reviewed and stable  Post vital signs: Reviewed and stable  Last Vitals:  Vitals:   10/13/15 0711  BP: 124/78  Pulse: 69  Resp: 20  Temp: 36.7 C    Last Pain:  Vitals:   10/13/15 0711  TempSrc: Oral         Complications: No apparent anesthesia complications

## 2015-10-13 NOTE — Anesthesia Preprocedure Evaluation (Addendum)
Anesthesia Evaluation  Patient identified by MRN, date of birth, ID band Patient awake    Reviewed: Allergy & Precautions, NPO status , Patient's Chart, lab work & pertinent test results  History of Anesthesia Complications Negative for: history of anesthetic complications  Airway Mallampati: III       Dental  (+) Teeth Intact   Pulmonary neg pulmonary ROS,    breath sounds clear to auscultation       Cardiovascular Exercise Tolerance: Good METS+ dysrhythmias Atrial Fibrillation + pacemaker  Rhythm:Regular     Neuro/Psych negative neurological ROS     GI/Hepatic negative GI ROS, Neg liver ROS,   Endo/Other  negative endocrine ROS  Renal/GU negative Renal ROS     Musculoskeletal   Abdominal Normal abdominal exam  (+)   Peds  Hematology negative hematology ROS (+)   Anesthesia Other Findings   Reproductive/Obstetrics                            Anesthesia Physical Anesthesia Plan  ASA: III  Anesthesia Plan: General   Post-op Pain Management:    Induction: Intravenous  Airway Management Planned: Natural Airway and Nasal Cannula  Additional Equipment:   Intra-op Plan:   Post-operative Plan:   Informed Consent: I have reviewed the patients History and Physical, chart, labs and discussed the procedure including the risks, benefits and alternatives for the proposed anesthesia with the patient or authorized representative who has indicated his/her understanding and acceptance.     Plan Discussed with: CRNA  Anesthesia Plan Comments:         Anesthesia Quick Evaluation

## 2015-10-14 ENCOUNTER — Encounter: Payer: Self-pay | Admitting: Gastroenterology

## 2015-10-17 LAB — SURGICAL PATHOLOGY

## 2015-10-24 DIAGNOSIS — R490 Dysphonia: Secondary | ICD-10-CM | POA: Diagnosis not present

## 2015-10-24 DIAGNOSIS — R1310 Dysphagia, unspecified: Secondary | ICD-10-CM | POA: Diagnosis not present

## 2015-10-31 DIAGNOSIS — H401131 Primary open-angle glaucoma, bilateral, mild stage: Secondary | ICD-10-CM | POA: Diagnosis not present

## 2015-10-31 DIAGNOSIS — H52223 Regular astigmatism, bilateral: Secondary | ICD-10-CM | POA: Diagnosis not present

## 2015-10-31 DIAGNOSIS — I10 Essential (primary) hypertension: Secondary | ICD-10-CM | POA: Diagnosis not present

## 2015-10-31 DIAGNOSIS — H5213 Myopia, bilateral: Secondary | ICD-10-CM | POA: Diagnosis not present

## 2015-10-31 DIAGNOSIS — H25813 Combined forms of age-related cataract, bilateral: Secondary | ICD-10-CM | POA: Diagnosis not present

## 2015-10-31 DIAGNOSIS — H524 Presbyopia: Secondary | ICD-10-CM | POA: Diagnosis not present

## 2015-11-08 DIAGNOSIS — R1313 Dysphagia, pharyngeal phase: Secondary | ICD-10-CM | POA: Diagnosis not present

## 2015-11-08 DIAGNOSIS — R499 Unspecified voice and resonance disorder: Secondary | ICD-10-CM | POA: Diagnosis not present

## 2015-11-22 DIAGNOSIS — L570 Actinic keratosis: Secondary | ICD-10-CM | POA: Diagnosis not present

## 2015-11-22 DIAGNOSIS — R499 Unspecified voice and resonance disorder: Secondary | ICD-10-CM | POA: Diagnosis not present

## 2015-11-22 DIAGNOSIS — Z85828 Personal history of other malignant neoplasm of skin: Secondary | ICD-10-CM | POA: Diagnosis not present

## 2015-11-22 DIAGNOSIS — L130 Dermatitis herpetiformis: Secondary | ICD-10-CM | POA: Diagnosis not present

## 2015-11-22 DIAGNOSIS — R1313 Dysphagia, pharyngeal phase: Secondary | ICD-10-CM | POA: Diagnosis not present

## 2015-11-22 DIAGNOSIS — Z872 Personal history of diseases of the skin and subcutaneous tissue: Secondary | ICD-10-CM | POA: Diagnosis not present

## 2015-11-22 DIAGNOSIS — X32XXXA Exposure to sunlight, initial encounter: Secondary | ICD-10-CM | POA: Diagnosis not present

## 2015-11-22 DIAGNOSIS — Z09 Encounter for follow-up examination after completed treatment for conditions other than malignant neoplasm: Secondary | ICD-10-CM | POA: Diagnosis not present

## 2015-11-22 DIAGNOSIS — E538 Deficiency of other specified B group vitamins: Secondary | ICD-10-CM | POA: Diagnosis not present

## 2015-11-24 ENCOUNTER — Other Ambulatory Visit: Payer: Self-pay | Admitting: Neurology

## 2015-11-24 DIAGNOSIS — R1313 Dysphagia, pharyngeal phase: Secondary | ICD-10-CM

## 2015-11-29 DIAGNOSIS — I495 Sick sinus syndrome: Secondary | ICD-10-CM | POA: Diagnosis not present

## 2015-12-07 ENCOUNTER — Ambulatory Visit
Admission: RE | Admit: 2015-12-07 | Discharge: 2015-12-07 | Disposition: A | Discharge status: Home / Self Care | Payer: Commercial Managed Care - HMO | Source: Ambulatory Visit | Attending: Neurology | Admitting: Neurology

## 2015-12-07 DIAGNOSIS — R1310 Dysphagia, unspecified: Secondary | ICD-10-CM | POA: Diagnosis not present

## 2015-12-07 DIAGNOSIS — R499 Unspecified voice and resonance disorder: Secondary | ICD-10-CM | POA: Insufficient documentation

## 2015-12-07 DIAGNOSIS — R1313 Dysphagia, pharyngeal phase: Secondary | ICD-10-CM | POA: Diagnosis not present

## 2015-12-27 DIAGNOSIS — R499 Unspecified voice and resonance disorder: Secondary | ICD-10-CM | POA: Diagnosis not present

## 2015-12-27 DIAGNOSIS — R1313 Dysphagia, pharyngeal phase: Secondary | ICD-10-CM | POA: Diagnosis not present

## 2015-12-27 DIAGNOSIS — E538 Deficiency of other specified B group vitamins: Secondary | ICD-10-CM | POA: Diagnosis not present

## 2016-01-09 DIAGNOSIS — Z9889 Other specified postprocedural states: Secondary | ICD-10-CM | POA: Diagnosis not present

## 2016-01-09 DIAGNOSIS — I480 Paroxysmal atrial fibrillation: Secondary | ICD-10-CM | POA: Diagnosis not present

## 2016-01-13 DIAGNOSIS — R5383 Other fatigue: Secondary | ICD-10-CM | POA: Diagnosis not present

## 2016-01-13 DIAGNOSIS — R490 Dysphonia: Secondary | ICD-10-CM | POA: Diagnosis not present

## 2016-01-13 DIAGNOSIS — L130 Dermatitis herpetiformis: Secondary | ICD-10-CM | POA: Diagnosis not present

## 2016-01-13 DIAGNOSIS — R499 Unspecified voice and resonance disorder: Secondary | ICD-10-CM | POA: Diagnosis not present

## 2016-01-13 DIAGNOSIS — R1313 Dysphagia, pharyngeal phase: Secondary | ICD-10-CM | POA: Diagnosis not present

## 2016-01-26 DIAGNOSIS — R5383 Other fatigue: Secondary | ICD-10-CM | POA: Diagnosis not present

## 2016-01-26 DIAGNOSIS — R1313 Dysphagia, pharyngeal phase: Secondary | ICD-10-CM | POA: Diagnosis not present

## 2016-01-26 DIAGNOSIS — R499 Unspecified voice and resonance disorder: Secondary | ICD-10-CM | POA: Diagnosis not present

## 2016-01-26 DIAGNOSIS — L130 Dermatitis herpetiformis: Secondary | ICD-10-CM | POA: Diagnosis not present

## 2016-01-31 DIAGNOSIS — H401131 Primary open-angle glaucoma, bilateral, mild stage: Secondary | ICD-10-CM | POA: Diagnosis not present

## 2016-01-31 DIAGNOSIS — I10 Essential (primary) hypertension: Secondary | ICD-10-CM | POA: Diagnosis not present

## 2016-01-31 DIAGNOSIS — H25813 Combined forms of age-related cataract, bilateral: Secondary | ICD-10-CM | POA: Diagnosis not present

## 2016-01-31 DIAGNOSIS — H5213 Myopia, bilateral: Secondary | ICD-10-CM | POA: Diagnosis not present

## 2016-01-31 DIAGNOSIS — H524 Presbyopia: Secondary | ICD-10-CM | POA: Diagnosis not present

## 2016-01-31 DIAGNOSIS — H52223 Regular astigmatism, bilateral: Secondary | ICD-10-CM | POA: Diagnosis not present

## 2016-05-18 DIAGNOSIS — H25813 Combined forms of age-related cataract, bilateral: Secondary | ICD-10-CM | POA: Diagnosis not present

## 2016-05-18 DIAGNOSIS — H5213 Myopia, bilateral: Secondary | ICD-10-CM | POA: Diagnosis not present

## 2016-05-18 DIAGNOSIS — H52223 Regular astigmatism, bilateral: Secondary | ICD-10-CM | POA: Diagnosis not present

## 2016-05-18 DIAGNOSIS — I10 Essential (primary) hypertension: Secondary | ICD-10-CM | POA: Diagnosis not present

## 2016-05-18 DIAGNOSIS — H401131 Primary open-angle glaucoma, bilateral, mild stage: Secondary | ICD-10-CM | POA: Diagnosis not present

## 2016-05-18 DIAGNOSIS — H524 Presbyopia: Secondary | ICD-10-CM | POA: Diagnosis not present

## 2016-05-29 DIAGNOSIS — I495 Sick sinus syndrome: Secondary | ICD-10-CM | POA: Diagnosis not present

## 2016-07-09 DIAGNOSIS — I480 Paroxysmal atrial fibrillation: Secondary | ICD-10-CM | POA: Diagnosis not present

## 2016-07-09 DIAGNOSIS — Z45018 Encounter for adjustment and management of other part of cardiac pacemaker: Secondary | ICD-10-CM | POA: Diagnosis not present

## 2016-07-09 DIAGNOSIS — Z9889 Other specified postprocedural states: Secondary | ICD-10-CM | POA: Diagnosis not present

## 2016-07-09 DIAGNOSIS — Z950 Presence of cardiac pacemaker: Secondary | ICD-10-CM | POA: Diagnosis not present

## 2016-08-13 DIAGNOSIS — C61 Malignant neoplasm of prostate: Secondary | ICD-10-CM | POA: Diagnosis not present

## 2016-08-13 DIAGNOSIS — R351 Nocturia: Secondary | ICD-10-CM | POA: Diagnosis not present

## 2016-08-13 DIAGNOSIS — R3915 Urgency of urination: Secondary | ICD-10-CM | POA: Diagnosis not present

## 2016-08-13 DIAGNOSIS — R3916 Straining to void: Secondary | ICD-10-CM | POA: Diagnosis not present

## 2016-08-13 DIAGNOSIS — R350 Frequency of micturition: Secondary | ICD-10-CM | POA: Diagnosis not present

## 2016-08-13 DIAGNOSIS — D400 Neoplasm of uncertain behavior of prostate: Secondary | ICD-10-CM | POA: Diagnosis not present

## 2016-08-13 DIAGNOSIS — R3914 Feeling of incomplete bladder emptying: Secondary | ICD-10-CM | POA: Diagnosis not present

## 2016-10-16 DIAGNOSIS — R3915 Urgency of urination: Secondary | ICD-10-CM | POA: Diagnosis not present

## 2016-10-16 DIAGNOSIS — R310 Gross hematuria: Secondary | ICD-10-CM | POA: Diagnosis not present

## 2016-10-16 DIAGNOSIS — R351 Nocturia: Secondary | ICD-10-CM | POA: Diagnosis not present

## 2016-10-18 ENCOUNTER — Other Ambulatory Visit: Payer: Self-pay | Admitting: Urology

## 2016-10-18 DIAGNOSIS — R310 Gross hematuria: Secondary | ICD-10-CM

## 2016-10-22 DIAGNOSIS — H401131 Primary open-angle glaucoma, bilateral, mild stage: Secondary | ICD-10-CM | POA: Diagnosis not present

## 2016-10-22 DIAGNOSIS — I10 Essential (primary) hypertension: Secondary | ICD-10-CM | POA: Diagnosis not present

## 2016-10-22 DIAGNOSIS — H25813 Combined forms of age-related cataract, bilateral: Secondary | ICD-10-CM | POA: Diagnosis not present

## 2016-10-22 DIAGNOSIS — H524 Presbyopia: Secondary | ICD-10-CM | POA: Diagnosis not present

## 2016-10-22 DIAGNOSIS — H52223 Regular astigmatism, bilateral: Secondary | ICD-10-CM | POA: Diagnosis not present

## 2016-10-22 DIAGNOSIS — H5213 Myopia, bilateral: Secondary | ICD-10-CM | POA: Diagnosis not present

## 2016-10-26 ENCOUNTER — Ambulatory Visit: Payer: Medicare HMO

## 2016-11-05 ENCOUNTER — Ambulatory Visit
Admission: RE | Admit: 2016-11-05 | Discharge: 2016-11-05 | Disposition: A | Discharge status: Home / Self Care | Payer: Medicare HMO | Source: Ambulatory Visit | Attending: Urology | Admitting: Urology

## 2016-11-05 DIAGNOSIS — R310 Gross hematuria: Secondary | ICD-10-CM | POA: Diagnosis not present

## 2016-11-05 DIAGNOSIS — I700 Atherosclerosis of aorta: Secondary | ICD-10-CM | POA: Diagnosis not present

## 2016-11-05 DIAGNOSIS — R3915 Urgency of urination: Secondary | ICD-10-CM | POA: Diagnosis not present

## 2016-11-05 DIAGNOSIS — N400 Enlarged prostate without lower urinary tract symptoms: Secondary | ICD-10-CM | POA: Diagnosis not present

## 2016-11-05 DIAGNOSIS — R351 Nocturia: Secondary | ICD-10-CM | POA: Diagnosis not present

## 2016-11-05 DIAGNOSIS — N323 Diverticulum of bladder: Secondary | ICD-10-CM | POA: Diagnosis not present

## 2016-11-05 HISTORY — DX: Essential (primary) hypertension: I10

## 2016-11-05 HISTORY — DX: Malignant neoplasm of prostate: C61

## 2016-11-05 MED ORDER — IOPAMIDOL (ISOVUE-300) INJECTION 61%
125.0000 mL | Freq: Once | INTRAVENOUS | Status: AC | PRN
  Administered 2016-11-05: 125 mL via INTRAVENOUS

## 2016-11-07 DIAGNOSIS — R310 Gross hematuria: Secondary | ICD-10-CM | POA: Diagnosis not present

## 2016-11-07 DIAGNOSIS — C61 Malignant neoplasm of prostate: Secondary | ICD-10-CM | POA: Diagnosis not present

## 2016-11-07 DIAGNOSIS — N323 Diverticulum of bladder: Secondary | ICD-10-CM | POA: Diagnosis not present

## 2016-11-28 DIAGNOSIS — E538 Deficiency of other specified B group vitamins: Secondary | ICD-10-CM | POA: Diagnosis not present

## 2016-11-28 DIAGNOSIS — Z0000 Encounter for general adult medical examination without abnormal findings: Secondary | ICD-10-CM | POA: Diagnosis not present

## 2016-11-28 DIAGNOSIS — Z125 Encounter for screening for malignant neoplasm of prostate: Secondary | ICD-10-CM | POA: Diagnosis not present

## 2016-11-28 DIAGNOSIS — K900 Celiac disease: Secondary | ICD-10-CM | POA: Diagnosis not present

## 2016-11-28 DIAGNOSIS — R319 Hematuria, unspecified: Secondary | ICD-10-CM | POA: Diagnosis not present

## 2016-11-28 DIAGNOSIS — R499 Unspecified voice and resonance disorder: Secondary | ICD-10-CM | POA: Diagnosis not present

## 2016-11-28 DIAGNOSIS — I480 Paroxysmal atrial fibrillation: Secondary | ICD-10-CM | POA: Diagnosis not present

## 2016-12-20 DIAGNOSIS — E538 Deficiency of other specified B group vitamins: Secondary | ICD-10-CM | POA: Diagnosis not present

## 2016-12-26 DIAGNOSIS — E538 Deficiency of other specified B group vitamins: Secondary | ICD-10-CM | POA: Diagnosis not present

## 2017-01-04 DIAGNOSIS — E538 Deficiency of other specified B group vitamins: Secondary | ICD-10-CM | POA: Diagnosis not present

## 2017-01-09 DIAGNOSIS — E538 Deficiency of other specified B group vitamins: Secondary | ICD-10-CM | POA: Diagnosis not present

## 2017-01-23 DIAGNOSIS — Z9889 Other specified postprocedural states: Secondary | ICD-10-CM | POA: Diagnosis not present

## 2017-01-23 DIAGNOSIS — I480 Paroxysmal atrial fibrillation: Secondary | ICD-10-CM | POA: Diagnosis not present

## 2017-01-23 DIAGNOSIS — Z950 Presence of cardiac pacemaker: Secondary | ICD-10-CM | POA: Diagnosis not present

## 2017-01-28 DIAGNOSIS — H401131 Primary open-angle glaucoma, bilateral, mild stage: Secondary | ICD-10-CM | POA: Diagnosis not present

## 2017-01-28 DIAGNOSIS — H52223 Regular astigmatism, bilateral: Secondary | ICD-10-CM | POA: Diagnosis not present

## 2017-01-28 DIAGNOSIS — H25813 Combined forms of age-related cataract, bilateral: Secondary | ICD-10-CM | POA: Diagnosis not present

## 2017-01-28 DIAGNOSIS — H5213 Myopia, bilateral: Secondary | ICD-10-CM | POA: Diagnosis not present

## 2017-01-28 DIAGNOSIS — I10 Essential (primary) hypertension: Secondary | ICD-10-CM | POA: Diagnosis not present

## 2017-02-04 DIAGNOSIS — D2272 Melanocytic nevi of left lower limb, including hip: Secondary | ICD-10-CM | POA: Diagnosis not present

## 2017-02-04 DIAGNOSIS — D400 Neoplasm of uncertain behavior of prostate: Secondary | ICD-10-CM | POA: Diagnosis not present

## 2017-02-04 DIAGNOSIS — C61 Malignant neoplasm of prostate: Secondary | ICD-10-CM | POA: Diagnosis not present

## 2017-02-04 DIAGNOSIS — D225 Melanocytic nevi of trunk: Secondary | ICD-10-CM | POA: Diagnosis not present

## 2017-02-04 DIAGNOSIS — D2261 Melanocytic nevi of right upper limb, including shoulder: Secondary | ICD-10-CM | POA: Diagnosis not present

## 2017-02-04 DIAGNOSIS — Z85828 Personal history of other malignant neoplasm of skin: Secondary | ICD-10-CM | POA: Diagnosis not present

## 2017-02-04 DIAGNOSIS — L570 Actinic keratosis: Secondary | ICD-10-CM | POA: Diagnosis not present

## 2017-02-04 DIAGNOSIS — X32XXXA Exposure to sunlight, initial encounter: Secondary | ICD-10-CM | POA: Diagnosis not present

## 2017-02-13 DIAGNOSIS — N4231 Prostatic intraepithelial neoplasia: Secondary | ICD-10-CM | POA: Diagnosis not present

## 2017-02-13 DIAGNOSIS — C61 Malignant neoplasm of prostate: Secondary | ICD-10-CM | POA: Diagnosis not present

## 2017-02-13 DIAGNOSIS — N4232 Atypical small acinar proliferation of prostate: Secondary | ICD-10-CM | POA: Diagnosis not present

## 2017-02-13 DIAGNOSIS — E538 Deficiency of other specified B group vitamins: Secondary | ICD-10-CM | POA: Diagnosis not present

## 2017-02-13 DIAGNOSIS — R972 Elevated prostate specific antigen [PSA]: Secondary | ICD-10-CM | POA: Diagnosis not present

## 2017-02-13 DIAGNOSIS — D400 Neoplasm of uncertain behavior of prostate: Secondary | ICD-10-CM | POA: Diagnosis not present

## 2017-02-27 DIAGNOSIS — C61 Malignant neoplasm of prostate: Secondary | ICD-10-CM | POA: Diagnosis not present

## 2017-02-27 DIAGNOSIS — R972 Elevated prostate specific antigen [PSA]: Secondary | ICD-10-CM | POA: Diagnosis not present

## 2017-02-27 DIAGNOSIS — D400 Neoplasm of uncertain behavior of prostate: Secondary | ICD-10-CM | POA: Diagnosis not present

## 2017-03-15 DIAGNOSIS — R972 Elevated prostate specific antigen [PSA]: Secondary | ICD-10-CM | POA: Diagnosis not present

## 2017-03-15 DIAGNOSIS — R748 Abnormal levels of other serum enzymes: Secondary | ICD-10-CM | POA: Diagnosis not present

## 2017-03-22 DIAGNOSIS — E538 Deficiency of other specified B group vitamins: Secondary | ICD-10-CM | POA: Diagnosis not present

## 2017-04-09 ENCOUNTER — Ambulatory Visit: Payer: Medicare HMO | Attending: Family Medicine | Admitting: Speech Pathology

## 2017-04-09 ENCOUNTER — Encounter: Payer: Self-pay | Admitting: Speech Pathology

## 2017-04-09 ENCOUNTER — Other Ambulatory Visit: Payer: Self-pay

## 2017-04-09 DIAGNOSIS — R490 Dysphonia: Secondary | ICD-10-CM | POA: Insufficient documentation

## 2017-04-09 NOTE — Therapy (Signed)
[place] MAIN [facility] SERVICES [address] [place], [place], [address] Phone: [phone]   Fax:  [phone]  Speech Language Pathology Evaluation  Patient Details  Name: Daniel [name] MRN: [id number] Date of Birth: March 28, 1940 Referring Provider: Maryland Pink    Encounter Date: 04/09/2017  End of Session - 04/09/17 1613    Visit Number  1    Number of Visits  17    Date for SLP Re-Evaluation  06/09/17    SLP Start Time  1000    SLP Stop Time   1054    SLP Time Calculation (min)  54 min    Activity Tolerance  Patient tolerated treatment well       Past Medical History:  Diagnosis Date   AF (atrial fibrillation) ([place])    B12 deficiency    DH (dermatitis herpetiformis)    Dysphagia    Hypertension    Prostate cancer ([place]) 2014    Past Surgical History:  Procedure Laterality Date   APPENDECTOMY     CARDIAC CATHETERIZATION     COLONOSCOPY     ESOPHAGOGASTRODUODENOSCOPY (EGD) WITH PROPOFOL N/A 10/13/2015   Procedure: ESOPHAGOGASTRODUODENOSCOPY (EGD) WITH PROPOFOL;  Surgeon: [name], MD;  Location: [facility] ENDOSCOPY;  Service: Endoscopy;  Laterality: N/A;   PACEMAKER PLACEMENT      There were no vitals filed for this visit.      SLP Evaluation [facility] - 04/09/17 0001      SLP Visit Information   Referring Provider  Maryland Pink     Onset Date  03/19/2017    Medical Diagnosis  Chronic hoarseness      Subjective   Subjective   "I'm ready to work on my voice now"    Patient/Family Stated Goal  Stronger voice and able to sing      General Information   HPI  77 year old man, with chronic hoarseness, referred by Dr. Kary Kos for voice therapy.  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 Tami Ribas.      Prior Functional Status   Cognitive/Linguistic Baseline  Within functional limits      Oral Motor/Sensory Function   Overall Oral Motor/Sensory Function  Appears within functional limits for  tasks assessed      Motor Speech   Overall Motor Speech  Impaired    Respiration  Impaired    Level of Impairment  Conversation    Phonation  Breathy;Low vocal intensity    Resonance  Hypernasality    Articulation  Within functional limitis    Intelligibility  Intelligible    Motor Planning  Witnin functional limits    Phonation  Impaired    Vocal Abuses  Habitual Cough/Throat Clear;Habitual Hyperphonia;Vocal Fold Dehydration;Prolonged Vocal Use    Tension Present  Jaw;Neck;Shoulder    Volume  Soft    Pitch  High      Standardized Assessments   Standardized Assessments   Other Assessment Perceptual Voice Evaluation       Perceptual Voice Evaluation Voice checklist:  Health risks: allergies   Characteristic voice use: 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  Environmental risks: no significant environmental risks  Misuse: excessive talking/singing  Abuse: coughing/throat clearing  Vocal characteristics: breathy, habitual high pitch, limited voice range, poor vocal projection, excessive pharyngeal resonance Patient quality of life survey: VHI-10: 17 (A score of 10 or higher indicate voice handicap) Maximum phonation time for sustained ah: 15 seconds Average fundamental  frequency during sustained ah: 180 Hz (1.5 STD above average for age and gender) Highest dynamic pitch when altering pitch from a low note to a high note: 245 Hz Lowest dynamic pitch when altering from a high note to a low note: 172 Hz Highest dynamic pitch in conversational speech: 192 Hz Lowest dynamic pitch in conversational speech: 162 Hz Average time patient was able to sustain /s/: 8 seconds Average time patient was able to sustain /z/: could not do s/z ratio : N/A Visi-Pitch: Multi-Dimensional Voice Program (MDVP)  MDVP extracts objective quantitative values (Relative Average Perturbation, Shimmer, Voice Turbulence Index, and Noise to Harmonic Ratio) on sustained  phonation, which are displayed graphically and numerically in comparison to a built-in normative database.  The patient exhibited values outside the norm for Relative Average Perturbation and Shimmer.  Average fundamental frequency was 105 STD above average for age and gender. He was able to improve all parameters with cues to be louder.  Education: Patient instructed in extrinsic laryngeal muscle stretches and breath support exercises  SLP Education - 04/09/17 1613    Education provided  Yes    Education Details  POC    Person(s) Educated  Patient    Methods  Explanation    Comprehension  Verbalized understanding         SLP Long Term Goals - 04/09/17 1616      SLP LONG TERM GOAL #1   Title  The patient will demonstrate independent understanding of vocal hygiene concepts and extrinsic laryngeal muscle stretches.      Time  8    Period  Weeks    Status  New    Target Date  06/09/17      SLP LONG TERM GOAL #2   Title  The patient will be independent for abdominal breathing and breath support exercises.    Time  8    Period  Weeks    Status  New    Target Date  06/09/17      SLP LONG TERM GOAL #3   Title  The patient will maximize voice quality and loudness using breath support/oral resonance for sustained vowel production, pitch glides, and hierarchal speech drill.    Time  8    Period  Weeks    Status  New    Target Date  06/09/17      SLP LONG TERM GOAL #4   Title  The patient will maximize voice quality and loudness using breath support/oral resonance for paragraph length recitation with 80% accuracy.    Time  8    Period  Weeks    Status  New    Target Date  06/09/17       Plan - 04/09/17 1614    Clinical Impression Statement  This 77 year man, with bowed vocal cords, is presenting with moderate-severe dysphonia characterized by breathy vocal quality, habitual high pitch, hypernasality, limited voice range, poor vocal projection, vocal fatigue, excessive pharyngeal  resonance.  The patient will benefit from voice therapy for education, to improve breath control for speech, reduce laryngeal tension, and promote relaxed phonation / oral resonance.      Speech Therapy Frequency  2x / week    Duration  Other (comment) 8 weeks    Treatment/Interventions  SLP instruction and feedback;Patient/family education Voice therapy    Potential to Achieve Goals  Good    Potential Considerations  Ability to learn/carryover information;Pain level;Family/community support;Co-morbidities;Previous level of function;Cooperation/participation level;Severity of impairments;Medical prognosis  SLP Home Exercise Plan  neck/tongue/throat stretches; breath support exercises    Consulted and Agree with Plan of Care  Patient       Patient will benefit from skilled therapeutic intervention in order to improve the following deficits and impairments:   Dysphonia - Plan: SLP plan of care cert/re-cert    Problem List There are no active problems to display for this patient.  Leroy Sea, [place], Susie 04/09/2017, 4:20 PM  [place] MAIN [facility] SERVICES [address] [place], [place], [address] Phone: [phone]   Fax:  [phone]  Name: JAVIAN NUDD MRN: [id number] Date of Birth: [birth year]

## 2017-04-11 ENCOUNTER — Encounter: Payer: Self-pay | Admitting: Speech Pathology

## 2017-04-11 ENCOUNTER — Ambulatory Visit: Payer: Medicare HMO | Admitting: Speech Pathology

## 2017-04-11 DIAGNOSIS — R490 Dysphonia: Secondary | ICD-10-CM | POA: Diagnosis not present

## 2017-04-11 NOTE — Therapy (Signed)
[place] MAIN [facility] SERVICES [address] [place], [place], [address] Phone: [phone]   Fax:  [phone]  Speech Language Pathology Treatment  Patient Details  Name: Daniel [name] MRN: [id number] Date of Birth: [birth year] Referring Provider: Maryland Pink    Encounter Date: 04/11/2017  End of Session - 04/11/17 1533    Visit Number  2    Number of Visits  17    Date for SLP Re-Evaluation  06/09/17    SLP Start Time  1000    SLP Stop Time   1053    SLP Time Calculation (min)  53 min    Activity Tolerance  Patient tolerated treatment well       Past Medical History:  Diagnosis Date  . AF (atrial fibrillation) ([place])   . B12 deficiency   . DH (dermatitis herpetiformis)   . Dysphagia   . Hypertension   . Prostate cancer ([place]) 2014    Past Surgical History:  Procedure Laterality Date  . APPENDECTOMY    . CARDIAC CATHETERIZATION    . COLONOSCOPY    . ESOPHAGOGASTRODUODENOSCOPY (EGD) WITH PROPOFOL N/A 10/13/2015   Procedure: ESOPHAGOGASTRODUODENOSCOPY (EGD) WITH PROPOFOL;  Surgeon: [name], MD;  Location: [facility] ENDOSCOPY;  Service: Endoscopy;  Laterality: N/A;  . PACEMAKER PLACEMENT      There were no vitals filed for this visit.  Subjective Assessment - 04/11/17 1532    Subjective  "I'm ready to work on my voice"            ADULT SLP TREATMENT - 04/11/17 0001      General Information   Behavior/Cognition  Alert;Cooperative;Pleasant mood    HPI   77 year old man, with chronic hoarseness, referred by Dr. Kary Kos for voice therapy.  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 Tami Ribas.       Treatment Provided   Treatment provided  Cognitive-Linquistic      Pain Assessment   Pain Assessment  No/denies pain      Cognitive-Linquistic Treatment   Treatment focused on  Voice    Skilled Treatment  The patient was provided with written and verbal teaching regarding neck, shoulder, tongue, and  throat stretches exercises to promote relaxed phonation. The patient was provided with written and verbal teaching regarding breath support exercises.  Patient instructed in relaxed phonation / oral resonance. The patient responded well to resonant voice therapy, specifically the hum.  He can achieve clear vocal quality with hum, clear vocal quality with hummed loudness variation, hummed pitch glides, /m/ syllables, initial /m/ words, and sentences.         Assessment / Recommendations / Plan   Plan  Continue with current plan of care      Progression Toward Goals   Progression toward goals  Progressing toward goals       SLP Education - 04/11/17 1533    Education provided  Yes    Education Details  resonant voice    Person(s) Educated  Patient    Methods  Explanation    Comprehension  Need further instruction         SLP Long Term Goals - 04/09/17 1616      SLP LONG TERM GOAL #1   Title  The patient will demonstrate independent understanding of vocal hygiene concepts and extrinsic laryngeal muscle stretches.      Time  8    Period  Weeks    Status  New  Target Date  06/09/17      SLP LONG TERM GOAL #2   Title  The patient will be independent for abdominal breathing and breath support exercises.    Time  8    Period  Weeks    Status  New    Target Date  06/09/17      SLP LONG TERM GOAL #3   Title  The patient will maximize voice quality and loudness using breath support/oral resonance for sustained vowel production, pitch glides, and hierarchal speech drill.    Time  8    Period  Weeks    Status  New    Target Date  06/09/17      SLP LONG TERM GOAL #4   Title  The patient will maximize voice quality and loudness using breath support/oral resonance for paragraph length recitation with 80% accuracy.    Time  8    Period  Weeks    Status  New    Target Date  06/09/17       Plan - 04/11/17 1534    Clinical Impression Statement   Patient able to improve vocal  quality with nasality to improve oral resonance and loudness to decrease laryngeal strain.    Speech Therapy Frequency  2x / week    Duration  Other (comment)    Treatment/Interventions  SLP instruction and feedback;Patient/family education;Other (comment) Voice therapy    Potential to Achieve Goals  Good    Potential Considerations  Ability to learn/carryover information;Pain level;Co-morbidities;Previous level of function;Cooperation/participation level;Severity of impairments;Medical prognosis    SLP Home Exercise Plan  neck/tongue/throat stretches; breath support exercises; resonant voice    Consulted and Agree with Plan of Care  Patient       Patient will benefit from skilled therapeutic intervention in order to improve the following deficits and impairments:   Dysphonia    Problem List There are no active problems to display for this patient.  Leroy Sea, [place], Susie 04/11/2017, 3:36 PM  [place] MAIN [facility] SERVICES [address] [place], [place], [address] Phone: [phone]   Fax:  [phone]   Name: Daniel [name] MRN: [id number] Date of Birth: August 25, 1940

## 2017-04-16 ENCOUNTER — Ambulatory Visit: Payer: Medicare HMO | Admitting: Speech Pathology

## 2017-04-16 ENCOUNTER — Encounter: Payer: Self-pay | Admitting: Speech Pathology

## 2017-04-16 DIAGNOSIS — R490 Dysphonia: Secondary | ICD-10-CM | POA: Diagnosis not present

## 2017-04-16 NOTE — Therapy (Signed)
[place] MAIN [facility] SERVICES [address] [place], [place], [address] Phone: [phone]   Fax:  [phone]  Speech Language Pathology Treatment  Patient Details  Name: Daniel [name] MRN: [id number] Date of Birth: Oct 22, 1940 Referring Provider: [place] Pink    Encounter Date: 04/16/2017  End of Session - 04/16/17 1050    Visit Number  3    Number of Visits  17    Date for SLP Re-Evaluation  06/09/17    SLP Start Time  1000    SLP Stop Time   1045    SLP Time Calculation (min)  45 min    Activity Tolerance  Patient tolerated treatment well       Past Medical History:  Diagnosis Date  . AF (atrial fibrillation) ([place])   . B12 deficiency   . DH (dermatitis herpetiformis)   . Dysphagia   . Hypertension   . Prostate cancer ([place]) 2014    Past Surgical History:  Procedure Laterality Date  . APPENDECTOMY    . CARDIAC CATHETERIZATION    . COLONOSCOPY    . ESOPHAGOGASTRODUODENOSCOPY (EGD) WITH PROPOFOL N/A 10/13/2015   Procedure: ESOPHAGOGASTRODUODENOSCOPY (EGD) WITH PROPOFOL;  Surgeon: [name], MD;  Location: [facility] ENDOSCOPY;  Service: Endoscopy;  Laterality: N/A;  . PACEMAKER PLACEMENT      There were no vitals filed for this visit.  Subjective Assessment - 04/16/17 1049    Subjective  Patient reports that people have remarked on his improve voice            ADULT SLP TREATMENT - 04/16/17 0001      General Information   Behavior/Cognition  Alert;Cooperative;Pleasant mood    HPI   77 year old man, with chronic hoarseness, referred by Dr. Kary Kos for voice therapy.  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 Tami Ribas.       Treatment Provided   Treatment provided  Cognitive-Linquistic      Pain Assessment   Pain Assessment  No/denies pain      Cognitive-Linquistic Treatment   Treatment focused on  Voice    Skilled Treatment  The patient was provided with written and verbal teaching regarding  neck, shoulder, tongue, and throat stretches exercises to promote relaxed phonation. Patient reports improving range of motion.  The patient was provided with written and verbal teaching regarding breath support exercises.  Patient instructed in relaxed phonation / oral resonance. The patient responded well to resonant voice therapy, specifically the hum.  He can achieve clear vocal quality with hum, clear vocal quality with hummed loudness variation, hummed pitch glides, /m/ syllables, initial /m/ words, and sentences.  He remains hypernasal, so the hum was changed to the buzz.  Maintains clear vocal quality with buzz, buzzed loudness change, buzzed pitch glides, and /z/ laden words/phrases/sentences. Patient able to generate a loud, good quality "ah" (76 dB, 10 seconds).      Assessment / Recommendations / Plan   Plan  Continue with current plan of care      Progression Toward Goals   Progression toward goals  Progressing toward goals       SLP Education - 04/16/17 1050    Education provided  Yes    Education Details  resonant voice    Person(s) Educated  Patient    Methods  Explanation    Comprehension  Verbalized understanding;Returned demonstration         SLP Long Term Goals - 04/09/17 1616  SLP LONG TERM GOAL #1   Title  The patient will demonstrate independent understanding of vocal hygiene concepts and extrinsic laryngeal muscle stretches.      Time  8    Period  Weeks    Status  New    Target Date  06/09/17      SLP LONG TERM GOAL #2   Title  The patient will be independent for abdominal breathing and breath support exercises.    Time  8    Period  Weeks    Status  New    Target Date  06/09/17      SLP LONG TERM GOAL #3   Title  The patient will maximize voice quality and loudness using breath support/oral resonance for sustained vowel production, pitch glides, and hierarchal speech drill.    Time  8    Period  Weeks    Status  New    Target Date  06/09/17       SLP LONG TERM GOAL #4   Title  The patient will maximize voice quality and loudness using breath support/oral resonance for paragraph length recitation with 80% accuracy.    Time  8    Period  Weeks    Status  New    Target Date  06/09/17       Plan - 04/16/17 1050    Clinical Impression Statement   Patient able to improve vocal quality with resonant voice (buzz) to improve oral resonance and loudness to decrease laryngeal strain.  Patient able to reduce perceived hypernasality with vocal loudness.    Speech Therapy Frequency  2x / week    Duration  Other (comment)    Treatment/Interventions  SLP instruction and feedback;Patient/family education;Other (comment) Voice therapy    Potential to Achieve Goals  Good    Potential Considerations  Ability to learn/carryover information;Pain level;Co-morbidities;Previous level of function;Cooperation/participation level;Severity of impairments;Medical prognosis    SLP Home Exercise Plan  neck/tongue/throat stretches; breath support exercises; resonant voice    Consulted and Agree with Plan of Care  Patient       Patient will benefit from skilled therapeutic intervention in order to improve the following deficits and impairments:   Dysphonia    Problem List There are no active problems to display for this patient.  Leroy Sea, MS/CCC- SLP  Lou Miner 04/16/2017, 10:51 AM  [place] MAIN [facility] SERVICES [address] [place], [place], [address] Phone: [phone]   Fax:  [phone]   Name: Daniel [name] MRN: [id number] Date of Birth: 1940-06-13

## 2017-04-19 ENCOUNTER — Ambulatory Visit: Payer: Medicare HMO | Admitting: Speech Pathology

## 2017-04-23 ENCOUNTER — Ambulatory Visit: Payer: Medicare HMO | Attending: Family Medicine | Admitting: Speech Pathology

## 2017-04-23 ENCOUNTER — Encounter: Payer: Self-pay | Admitting: Speech Pathology

## 2017-04-23 DIAGNOSIS — R490 Dysphonia: Secondary | ICD-10-CM | POA: Diagnosis not present

## 2017-04-23 NOTE — Therapy (Signed)
[place] MAIN [facility] SERVICES [address] [place], [place], [address] Phone: [phone]   Fax:  [phone]  Speech Language Pathology Treatment  Patient Details  Name: Daniel [name] MRN: [id number] Date of Birth: 11-25-1940 Referring Provider: [place] Pink    Encounter Date: 04/23/2017  End of Session - 04/23/17 1118    Visit Number  4    Number of Visits  17    Date for SLP Re-Evaluation  06/09/17    SLP Start Time  1000    SLP Stop Time   1055    SLP Time Calculation (min)  55 min    Activity Tolerance  Patient tolerated treatment well       Past Medical History:  Diagnosis Date  . AF (atrial fibrillation) ([place])   . B12 deficiency   . DH (dermatitis herpetiformis)   . Dysphagia   . Hypertension   . Prostate cancer ([place]) 2014    Past Surgical History:  Procedure Laterality Date  . APPENDECTOMY    . CARDIAC CATHETERIZATION    . COLONOSCOPY    . ESOPHAGOGASTRODUODENOSCOPY (EGD) WITH PROPOFOL N/A 10/13/2015   Procedure: ESOPHAGOGASTRODUODENOSCOPY (EGD) WITH PROPOFOL;  Surgeon: [name], MD;  Location: [facility] ENDOSCOPY;  Service: Endoscopy;  Laterality: N/A;  . PACEMAKER PLACEMENT      There were no vitals filed for this visit.         ADULT SLP TREATMENT - 04/23/17 0001      General Information   Behavior/Cognition  Alert;Cooperative;Pleasant mood    HPI   77 year old man, with chronic hoarseness, referred by Dr. Kary Kos for voice therapy.  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 Tami Ribas.       Treatment Provided   Treatment provided  Cognitive-Linquistic      Pain Assessment   Pain Assessment  No/denies pain      Cognitive-Linquistic Treatment   Treatment focused on  Voice    Skilled Treatment  stretches exercises to promote relaxed phonation. Patient reports improving range of motion.  The patient was provided with written and verbal teaching regarding breath support exercises.   Patient instructed in relaxed phonation / oral resonance. The patient responded well to resonant voice therapy, specifically the hum.  He can achieve clear vocal quality with hum, clear vocal quality with hummed loudness variation, hummed pitch glides, /m/ syllables, initial /m/ words, and sentences.  He remains hypernasal, so the hum was changed to the buzz.  Maintains clear vocal quality with buzz, buzzed loudness change, buzzed pitch glides, and /z/ laden words/phrases/sentences.  Patient reports that he is able to perceive the pharyngeal residue (shown during the [facility] 07/22/2015).  He was given the Shaker exercise.  In addition, we added loud "ah" and high effort pitch glides (effective for both voice and swallowing).  Patient able to generate a loud, good quality "ah" (81 dB, 10 seconds).  Patient able to successfully lower pitch while maintaining loud, good quality voice.      Assessment / Recommendations / Plan   Plan  Continue with current plan of care      Progression Toward Goals   Progression toward goals  Progressing toward goals       SLP Education - 04/23/17 1117    Education provided  Yes    Education Details  Shaker swallowing exercise, loud "ah", high effort pitch glide, lower pitch    Person(s) Educated  Patient    Methods  Explanation;Handout  Comprehension  Verbalized understanding;Returned demonstration         SLP Long Term Goals - 04/09/17 1616      SLP LONG TERM GOAL #1   Title  The patient will demonstrate independent understanding of vocal hygiene concepts and extrinsic laryngeal muscle stretches.      Time  8    Period  Weeks    Status  New    Target Date  06/09/17      SLP LONG TERM GOAL #2   Title  The patient will be independent for abdominal breathing and breath support exercises.    Time  8    Period  Weeks    Status  New    Target Date  06/09/17      SLP LONG TERM GOAL #3   Title  The patient will maximize voice quality and loudness using breath  support/oral resonance for sustained vowel production, pitch glides, and hierarchal speech drill.    Time  8    Period  Weeks    Status  New    Target Date  06/09/17      SLP LONG TERM GOAL #4   Title  The patient will maximize voice quality and loudness using breath support/oral resonance for paragraph length recitation with 80% accuracy.    Time  8    Period  Weeks    Status  New    Target Date  06/09/17       Plan - 04/23/17 1119    Clinical Impression Statement   Patient able to improve vocal quality with resonant voice (buzz) to improve oral resonance and loudness to decrease laryngeal strain.  Patient able to reduce perceived hypernasality with vocal loudness.  Patient able to lower pitch (habitually high for gender).    Speech Therapy Frequency  2x / week    Duration  Other (comment)    Treatment/Interventions  SLP instruction and feedback;Patient/family education;Other (comment) Voice therapy    Potential to Achieve Goals  Good    Potential Considerations  Ability to learn/carryover information;Pain level;Co-morbidities;Previous level of function;Cooperation/participation level;Severity of impairments;Medical prognosis    SLP Home Exercise Plan  neck/tongue/throat stretches; breath support exercises; resonant voice; swallowing/voice exercises    Consulted and Agree with Plan of Care  Patient       Patient will benefit from skilled therapeutic intervention in order to improve the following deficits and impairments:   Dysphonia    Problem List There are no active problems to display for this patient.  Leroy Sea, MS/CCC- SLP  Lou Miner 04/23/2017, 11:20 AM  [place] MAIN [facility] SERVICES [address] [place], [place], [address] Phone: [phone]   Fax:  [phone]   Name: Daniel [name] MRN: [id number] Date of Birth: April 08, 1940

## 2017-04-24 ENCOUNTER — Ambulatory Visit: Payer: Medicare HMO | Admitting: Speech Pathology

## 2017-04-24 DIAGNOSIS — H52223 Regular astigmatism, bilateral: Secondary | ICD-10-CM | POA: Diagnosis not present

## 2017-04-24 DIAGNOSIS — I10 Essential (primary) hypertension: Secondary | ICD-10-CM | POA: Diagnosis not present

## 2017-04-24 DIAGNOSIS — H25813 Combined forms of age-related cataract, bilateral: Secondary | ICD-10-CM | POA: Diagnosis not present

## 2017-04-24 DIAGNOSIS — H401131 Primary open-angle glaucoma, bilateral, mild stage: Secondary | ICD-10-CM | POA: Diagnosis not present

## 2017-04-24 DIAGNOSIS — H5213 Myopia, bilateral: Secondary | ICD-10-CM | POA: Diagnosis not present

## 2017-04-26 ENCOUNTER — Encounter: Payer: Self-pay | Admitting: Speech Pathology

## 2017-04-26 ENCOUNTER — Ambulatory Visit: Payer: Medicare HMO | Admitting: Speech Pathology

## 2017-04-26 DIAGNOSIS — R490 Dysphonia: Secondary | ICD-10-CM | POA: Diagnosis not present

## 2017-04-26 DIAGNOSIS — E538 Deficiency of other specified B group vitamins: Secondary | ICD-10-CM | POA: Diagnosis not present

## 2017-04-26 NOTE — Therapy (Signed)
[place] MAIN [facility] SERVICES [address] [place], [place], [address] Phone: [phone]   Fax:  [phone]  Speech Language Pathology Treatment  Patient Details  Name: Daniel [name] MRN: [id number] Date of Birth: [birth year] Referring Provider: [place] Pink    Encounter Date: 04/26/2017  End of Session - 04/26/17 1129    Visit Number  5    Number of Visits  17    Date for SLP Re-Evaluation  06/09/17    SLP Start Time  1000    SLP Stop Time   1050    SLP Time Calculation (min)  50 min    Activity Tolerance  Patient tolerated treatment well       Past Medical History:  Diagnosis Date  . AF (atrial fibrillation) ([place])   . B12 deficiency   . DH (dermatitis herpetiformis)   . Dysphagia   . Hypertension   . Prostate cancer ([place]) 2014    Past Surgical History:  Procedure Laterality Date  . APPENDECTOMY    . CARDIAC CATHETERIZATION    . COLONOSCOPY    . ESOPHAGOGASTRODUODENOSCOPY (EGD) WITH PROPOFOL N/A 10/13/2015   Procedure: ESOPHAGOGASTRODUODENOSCOPY (EGD) WITH PROPOFOL;  Surgeon: [name], MD;  Location: [facility] ENDOSCOPY;  Service: Endoscopy;  Laterality: N/A;  . PACEMAKER PLACEMENT      There were no vitals filed for this visit.  Subjective Assessment - 04/26/17 1128    Subjective  tient reports that he is singing better in choir            ADULT SLP TREATMENT - 04/26/17 0001      General Information   Behavior/Cognition  Alert;Cooperative;Pleasant mood    HPI   77 year old man, with chronic hoarseness, referred by Dr. Kary Kos for voice therapy.  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 Tami Ribas.       Treatment Provided   Treatment provided  Cognitive-Linquistic      Pain Assessment   Pain Assessment  No/denies pain      Cognitive-Linquistic Treatment   Treatment focused on  Voice    Skilled Treatment  The patient was provided with written and verbal teaching regarding neck, shoulder,  tongue, and throat stretches exercises to promote relaxed phonation. Patient reports improving range of motion.  The patient was provided with written and verbal teaching regarding breath support exercises.  Patient instructed in relaxed phonation / oral resonance. Patient reports that he is able to perceive the pharyngeal residue (shown during the [facility] 07/22/2015).  He was given the Shaker exercise- patient states he is doing the exercises.  In addition, we added loud "ah" and high effort pitch glides (effective for both voice and swallowing).  Patient able to generate a loud, good quality "ah" (76 dB - 88 dB).  Patient able to successfully lower pitch while maintaining loud, good quality voice.  Maintain loud, good quality voice for reading aloud, answering questions, and conversation.      Assessment / Recommendations / Plan   Plan  Continue with current plan of care      Progression Toward Goals   Progression toward goals  Progressing toward goals       SLP Education - 04/26/17 1128    Education Details  vocal loudness    Person(s) Educated  Patient    Methods  Explanation    Comprehension  Verbalized understanding         SLP Long Term Goals - 04/09/17 1616  SLP LONG TERM GOAL #1   Title  The patient will demonstrate independent understanding of vocal hygiene concepts and extrinsic laryngeal muscle stretches.      Time  8    Period  Weeks    Status  New    Target Date  06/09/17      SLP LONG TERM GOAL #2   Title  The patient will be independent for abdominal breathing and breath support exercises.    Time  8    Period  Weeks    Status  New    Target Date  06/09/17      SLP LONG TERM GOAL #3   Title  The patient will maximize voice quality and loudness using breath support/oral resonance for sustained vowel production, pitch glides, and hierarchal speech drill.    Time  8    Period  Weeks    Status  New    Target Date  06/09/17      SLP LONG TERM GOAL #4   Title  The  patient will maximize voice quality and loudness using breath support/oral resonance for paragraph length recitation with 80% accuracy.    Time  8    Period  Weeks    Status  New    Target Date  06/09/17       Plan - 04/26/17 1129    Clinical Impression Statement   Patient able to improve vocal quality with resonant voice (buzz) to improve oral resonance and loudness to decrease laryngeal strain.  Patient able to reduce perceived hypernasality with vocal loudness.  Patient able to lower pitch (habitually high for gender).    Speech Therapy Frequency  2x / week    Duration  Other (comment)    Treatment/Interventions  SLP instruction and feedback;Patient/family education;Other (comment) Voice therapy    Potential to Achieve Goals  Good    Potential Considerations  Ability to learn/carryover information;Pain level;Co-morbidities;Previous level of function;Cooperation/participation level;Severity of impairments;Medical prognosis    SLP Home Exercise Plan  neck/tongue/throat stretches; breath support exercises; resonant voice; swallowing/voice exercises    Consulted and Agree with Plan of Care  Patient       Patient will benefit from skilled therapeutic intervention in order to improve the following deficits and impairments:   Dysphonia    Problem List There are no active problems to display for this patient.  Leroy Sea, [place], [place]ie 04/26/2017, 11:30 AM  [place] MAIN [facility] SERVICES [address] [place], [place], [address] Phone: [phone]   Fax:  [phone]   Name: WILLYS SALVINO MRN: [id number] Date of Birth: November 17, 1940

## 2017-04-30 ENCOUNTER — Ambulatory Visit: Payer: Medicare HMO | Admitting: Speech Pathology

## 2017-05-01 ENCOUNTER — Encounter: Payer: Self-pay | Admitting: Speech Pathology

## 2017-05-01 ENCOUNTER — Ambulatory Visit: Payer: Medicare HMO | Admitting: Speech Pathology

## 2017-05-01 DIAGNOSIS — R490 Dysphonia: Secondary | ICD-10-CM

## 2017-05-01 NOTE — Therapy (Signed)
[place] MAIN [facility] SERVICES [address] [place], [place], [address] Phone: [phone]   Fax:  [phone]  Speech Language Pathology Treatment  Patient Details  Name: Daniel [name] MRN: [id number] Date of Birth: June 30, 1940 Referring Provider: Maryland Pink    Encounter Date: 05/01/2017  End of Session - 05/01/17 1202    Visit Number  6    Number of Visits  17    Date for SLP Re-Evaluation  06/09/17    SLP Start Time  1100    SLP Stop Time   1150    SLP Time Calculation (min)  50 min    Activity Tolerance  Patient tolerated treatment well       Past Medical History:  Diagnosis Date  . AF (atrial fibrillation) ([place])   . B12 deficiency   . DH (dermatitis herpetiformis)   . Dysphagia   . Hypertension   . Prostate cancer ([place]) 2014    Past Surgical History:  Procedure Laterality Date  . APPENDECTOMY    . CARDIAC CATHETERIZATION    . COLONOSCOPY    . ESOPHAGOGASTRODUODENOSCOPY (EGD) WITH PROPOFOL N/A 10/13/2015   Procedure: ESOPHAGOGASTRODUODENOSCOPY (EGD) WITH PROPOFOL;  Surgeon: [name], MD;  Location: [facility] ENDOSCOPY;  Service: Endoscopy;  Laterality: N/A;  . PACEMAKER PLACEMENT      There were no vitals filed for this visit.  Subjective Assessment - 05/01/17 1201    Subjective  Patient reports that his choir leader said his voice sounds better            ADULT SLP TREATMENT - 05/01/17 0001      General Information   Behavior/Cognition  Alert;Cooperative;Pleasant mood    HPI   77 year old man, with chronic hoarseness, referred by Dr. Kary Kos for voice therapy.  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 Tami Ribas.       Treatment Provided   Treatment provided  Cognitive-Linquistic      Pain Assessment   Pain Assessment  No/denies pain      Cognitive-Linquistic Treatment   Treatment focused on  Voice    Skilled Treatment  The patient was provided with written and verbal teaching  regarding neck, shoulder, tongue, and throat stretches exercises to promote relaxed phonation. Patient reports improving range of motion.  The patient was provided with written and verbal teaching regarding breath support exercises.  Patient instructed in relaxed phonation / oral resonance. Patient reports that he is able to perceive the pharyngeal residue (shown during the [facility] 07/22/2015).  He was given the Shaker exercise- patient states he is doing the exercises.  In addition, we added loud "ah" and high effort pitch glides (effective for both voice and swallowing).  Patient able to generate a loud, good quality "ah" (76 dB - 88 dB).  Patient able to successfully lower pitch while maintaining loud, good quality voice.  Maintain loud, good quality voice for reading aloud, answering questions, and conversation. Visi-Pitch Habitual pitch protocol: 191 Hz at beginning and 175 Hz at end of session.      Assessment / Recommendations / Plan   Plan  Continue with current plan of care      Progression Toward Goals   Progression toward goals  Progressing toward goals       SLP Education - 05/01/17 1201    Education provided  Yes    Education Details  pitch glides    Person(s) Educated  Patient    Methods  Explanation  Comprehension  Verbalized understanding         SLP Long Term Goals - 04/09/17 1616      SLP LONG TERM GOAL #1   Title  The patient will demonstrate independent understanding of vocal hygiene concepts and extrinsic laryngeal muscle stretches.      Time  8    Period  Weeks    Status  New    Target Date  06/09/17      SLP LONG TERM GOAL #2   Title  The patient will be independent for abdominal breathing and breath support exercises.    Time  8    Period  Weeks    Status  New    Target Date  06/09/17      SLP LONG TERM GOAL #3   Title  The patient will maximize voice quality and loudness using breath support/oral resonance for sustained vowel production, pitch glides, and  hierarchal speech drill.    Time  8    Period  Weeks    Status  New    Target Date  06/09/17      SLP LONG TERM GOAL #4   Title  The patient will maximize voice quality and loudness using breath support/oral resonance for paragraph length recitation with 80% accuracy.    Time  8    Period  Weeks    Status  New    Target Date  06/09/17       Plan - 05/01/17 1202    Clinical Impression Statement   Patient able to improve vocal quality with resonant voice (buzz) to improve oral resonance and loudness to decrease laryngeal strain.  Patient able to reduce perceived hypernasality with vocal loudness.  Patient is gaining control of pitch change.    Speech Therapy Frequency  2x / week    Duration  Other (comment)    Treatment/Interventions  SLP instruction and feedback;Patient/family education;Other (comment) Voice therapy    Potential to Achieve Goals  Good    Potential Considerations  Ability to learn/carryover information;Pain level;Co-morbidities;Previous level of function;Cooperation/participation level;Severity of impairments;Medical prognosis    SLP Home Exercise Plan  neck/tongue/throat stretches; breath support exercises; resonant voice; swallowing/voice exercises; pitch glides    Consulted and Agree with Plan of Care  Patient       Patient will benefit from skilled therapeutic intervention in order to improve the following deficits and impairments:   Dysphonia    Problem List There are no active problems to display for this patient.  Leroy Sea, MS/CCC- SLP  Lou Miner 05/01/2017, 12:03 PM  [place] MAIN [facility] SERVICES [address] [place], [place], [address] Phone: [phone]   Fax:  [phone]   Name: Daniel [name] MRN: [id number] Date of Birth: 04-Aug-1940

## 2017-05-03 ENCOUNTER — Ambulatory Visit: Payer: Medicare HMO | Admitting: Speech Pathology

## 2017-05-07 ENCOUNTER — Encounter: Payer: Self-pay | Admitting: Speech Pathology

## 2017-05-07 ENCOUNTER — Ambulatory Visit: Payer: Medicare HMO | Admitting: Speech Pathology

## 2017-05-07 DIAGNOSIS — R490 Dysphonia: Secondary | ICD-10-CM

## 2017-05-07 NOTE — Therapy (Signed)
[place] MAIN [facility] SERVICES [address] [place], [place], [address] Phone: [phone]   Fax:  [phone]  Speech Language Pathology Treatment  Patient Details  Name: Daniel [name] MRN: [id number] Date of Birth: [birth year] Referring Provider: [place] Pink    Encounter Date: 05/07/2017  End of Session - 05/07/17 1052    Visit Number  7    Number of Visits  17    Date for SLP Re-Evaluation  06/09/17    SLP Start Time  1000    SLP Stop Time   1046    SLP Time Calculation (min)  46 min    Activity Tolerance  Patient tolerated treatment well       Past Medical History:  Diagnosis Date  . AF (atrial fibrillation) ([place])   . B12 deficiency   . DH (dermatitis herpetiformis)   . Dysphagia   . Hypertension   . Prostate cancer ([place]) 2014    Past Surgical History:  Procedure Laterality Date  . APPENDECTOMY    . CARDIAC CATHETERIZATION    . COLONOSCOPY    . ESOPHAGOGASTRODUODENOSCOPY (EGD) WITH PROPOFOL N/A 10/13/2015   Procedure: ESOPHAGOGASTRODUODENOSCOPY (EGD) WITH PROPOFOL;  Surgeon: [name], MD;  Location: [facility] ENDOSCOPY;  Service: Endoscopy;  Laterality: N/A;  . PACEMAKER PLACEMENT      There were no vitals filed for this visit.  Subjective Assessment - 05/07/17 1051    Subjective  Patient reports that his choir leader said his voice sounds better            ADULT SLP TREATMENT - 05/07/17 0001      General Information   Behavior/Cognition  Alert;Cooperative;Pleasant mood    HPI   77 year old man, with chronic hoarseness, referred by Dr. Kary Kos for voice therapy.  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 Tami Ribas.       Treatment Provided   Treatment provided  Cognitive-Linquistic      Pain Assessment   Pain Assessment  No/denies pain      Cognitive-Linquistic Treatment   Treatment focused on  Voice    Skilled Treatment  stretches exercises to promote relaxed phonation. Patient reports  improving range of motion.  The patient was provided with written and verbal teaching regarding breath support exercises.  Patient instructed in relaxed phonation / oral resonance. Patient reports that he is able to perceive the pharyngeal residue (shown during the [facility] 07/22/2015).  He was given the Shaker exercise- patient states he is doing the exercises.  In addition, we added loud "ah" and high effort pitch glides (effective for both voice and swallowing).  Patient able to generate a loud, good quality "ah" (76 dB - 88 dB).  Patient able to successfully lower pitch while maintaining loud, good quality voice.  Maintain loud, good quality voice for reading aloud, answering questions, and conversation. Visi-Pitch Habitual pitch protocol: 186 Hz at beginning and 180 Hz at end of session.      Assessment / Recommendations / Plan   Plan  Continue with current plan of care      Progression Toward Goals   Progression toward goals  Progressing toward goals       SLP Education - 05/07/17 1051    Education provided  Yes    Education Details  use singing to improve pitch control    Person(s) Educated  Patient    Methods  Explanation    Comprehension  Verbalized understanding  SLP Long Term Goals - 04/09/17 1616      SLP LONG TERM GOAL #1   Title  The patient will demonstrate independent understanding of vocal hygiene concepts and extrinsic laryngeal muscle stretches.      Time  8    Period  Weeks    Status  New    Target Date  06/09/17      SLP LONG TERM GOAL #2   Title  The patient will be independent for abdominal breathing and breath support exercises.    Time  8    Period  Weeks    Status  New    Target Date  06/09/17      SLP LONG TERM GOAL #3   Title  The patient will maximize voice quality and loudness using breath support/oral resonance for sustained vowel production, pitch glides, and hierarchal speech drill.    Time  8    Period  Weeks    Status  New    Target Date   06/09/17      SLP LONG TERM GOAL #4   Title  The patient will maximize voice quality and loudness using breath support/oral resonance for paragraph length recitation with 80% accuracy.    Time  8    Period  Weeks    Status  New    Target Date  06/09/17       Plan - 05/07/17 1053    Clinical Impression Statement   Patient able to improve vocal quality with resonant voice (buzz) to improve oral resonance and loudness to decrease laryngeal strain.  Patient able to reduce perceived hypernasality with vocal loudness.  Patient continues to gain control of pitch change.    Speech Therapy Frequency  2x / week    Duration  Other (comment)    Treatment/Interventions  SLP instruction and feedback;Patient/family education;Other (comment) Voice therapy    Potential to Achieve Goals  Good    Potential Considerations  Ability to learn/carryover information;Pain level;Co-morbidities;Previous level of function;Cooperation/participation level;Severity of impairments;Medical prognosis    SLP Home Exercise Plan  neck/tongue/throat stretches; breath support exercises; resonant voice; swallowing/voice exercises; pitch glides    Consulted and Agree with Plan of Care  Patient       Patient will benefit from skilled therapeutic intervention in order to improve the following deficits and impairments:   Dysphonia    Problem List There are no active problems to display for this patient.  Leroy Sea, MS/CCC- SLP  Lou Miner 05/07/2017, 10:54 AM  [place] MAIN [facility] SERVICES [address] [place], [place], [address] Phone: [phone]   Fax:  [phone]   Name: Daniel [name] MRN: [id number] Date of Birth: 02/12/1940

## 2017-05-10 ENCOUNTER — Ambulatory Visit: Payer: Medicare HMO | Admitting: Speech Pathology

## 2017-05-10 ENCOUNTER — Encounter: Payer: Self-pay | Admitting: Speech Pathology

## 2017-05-10 DIAGNOSIS — R490 Dysphonia: Secondary | ICD-10-CM

## 2017-05-10 NOTE — Therapy (Signed)
[place] MAIN [facility] SERVICES [address] [place], [place], [address] Phone: [phone]   Fax:  [phone]  Speech Language Pathology Treatment  Patient Details  Name: Daniel [name] MRN: [id number] Date of Birth: 08-20-1940 Referring Provider: [place] Pink    Encounter Date: 05/10/2017  End of Session - 05/10/17 1310    Visit Number  8    Number of Visits  17    Date for SLP Re-Evaluation  06/09/17    SLP Start Time  1000    SLP Stop Time   1051    SLP Time Calculation (min)  51 min    Activity Tolerance  Patient tolerated treatment well       Past Medical History:  Diagnosis Date  . AF (atrial fibrillation) ([place])   . B12 deficiency   . DH (dermatitis herpetiformis)   . Dysphagia   . Hypertension   . Prostate cancer ([place]) 2014    Past Surgical History:  Procedure Laterality Date  . APPENDECTOMY    . CARDIAC CATHETERIZATION    . COLONOSCOPY    . ESOPHAGOGASTRODUODENOSCOPY (EGD) WITH PROPOFOL N/A 10/13/2015   Procedure: ESOPHAGOGASTRODUODENOSCOPY (EGD) WITH PROPOFOL;  Surgeon: [name], MD;  Location: [facility] ENDOSCOPY;  Service: Endoscopy;  Laterality: N/A;  . PACEMAKER PLACEMENT      There were no vitals filed for this visit.         ADULT SLP TREATMENT - 05/10/17 0001      General Information   Behavior/Cognition  Alert;Cooperative;Pleasant mood    HPI   77 year old man, with chronic hoarseness, referred by Dr. Kary Kos for voice therapy.  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 Tami Ribas.       Treatment Provided   Treatment provided  Cognitive-Linquistic      Pain Assessment   Pain Assessment  No/denies pain      Cognitive-Linquistic Treatment   Treatment focused on  Voice    Skilled Treatment  The patient was provided with written and verbal teaching regarding neck, shoulder, tongue, and throat stretches exercises to promote relaxed phonation. Patient reports improving range of  motion.  The patient was provided with written and verbal teaching regarding breath support exercises.  Patient instructed in relaxed phonation / oral resonance. Patient reports that he is able to perceive the pharyngeal residue (shown during the [facility] 07/22/2015).  He was given the Shaker exercise- patient states he is doing the exercises.  In addition, we added loud "ah" and high effort pitch glides (effective for both voice and swallowing).  Patient able to generate a loud, good quality "ah" (76 dB - 88 dB).  Patient able to successfully lower pitch while maintaining loud, good quality voice.  Maintain loud, good quality voice for reading aloud, answering questions, and conversation. Patient able to reduce perceived nasality with loudness.  Able to read loud words with no nasal sounds with oral resonance with 100% accuracy; generate phrases with oral resonance with 80% accuracy.  Patient is less accurate with sentences containing mixed oral and nasal sounds.      Assessment / Recommendations / Plan   Plan  Continue with current plan of care      Progression Toward Goals   Progression toward goals  Progressing toward goals       SLP Education - 05/10/17 1309    Education provided  Yes    Education Details  Reducing perceived nasality, swallowing education    Person(s) Educated  Patient    Methods  Explanation    Comprehension  Verbalized understanding         SLP Long Term Goals - 04/09/17 1616      SLP LONG TERM GOAL #1   Title  The patient will demonstrate independent understanding of vocal hygiene concepts and extrinsic laryngeal muscle stretches.      Time  8    Period  Weeks    Status  New    Target Date  06/09/17      SLP LONG TERM GOAL #2   Title  The patient will be independent for abdominal breathing and breath support exercises.    Time  8    Period  Weeks    Status  New    Target Date  06/09/17      SLP LONG TERM GOAL #3   Title  The patient will maximize voice quality  and loudness using breath support/oral resonance for sustained vowel production, pitch glides, and hierarchal speech drill.    Time  8    Period  Weeks    Status  New    Target Date  06/09/17      SLP LONG TERM GOAL #4   Title  The patient will maximize voice quality and loudness using breath support/oral resonance for paragraph length recitation with 80% accuracy.    Time  8    Period  Weeks    Status  New    Target Date  06/09/17       Plan - 05/10/17 1311    Clinical Impression Statement   Patient able to improve vocal quality with resonant voice (buzz) to improve oral resonance and loudness to decrease laryngeal strain.  Patient able to reduce perceived hypernasality with vocal loudness.  Patient continues to gain control of pitch change and perceived nasality.    Speech Therapy Frequency  2x / week    Duration  Other (comment)    Treatment/Interventions  SLP instruction and feedback;Patient/family education;Other (comment) Voice therapy    Potential to Achieve Goals  Good    Potential Considerations  Ability to learn/carryover information;Pain level;Co-morbidities;Previous level of function;Cooperation/participation level;Severity of impairments;Medical prognosis    SLP Home Exercise Plan  neck/tongue/throat stretches; breath support exercises; resonant voice; swallowing/voice exercises; pitch glides    Consulted and Agree with Plan of Care  Patient       Patient will benefit from skilled therapeutic intervention in order to improve the following deficits and impairments:   Dysphonia    Problem List There are no active problems to display for this patient.  Leroy Sea, MS/CCC- SLP  Lou Miner 05/10/2017, 1:13 PM  [place] MAIN [facility] SERVICES [address] [place], [place], [address] Phone: [phone]   Fax:  [phone]   Name: MINA CARLISI MRN: [id number] Date of Birth: [birth year]

## 2017-05-14 ENCOUNTER — Ambulatory Visit: Payer: Medicare HMO | Admitting: Speech Pathology

## 2017-05-14 ENCOUNTER — Encounter: Payer: Self-pay | Admitting: Speech Pathology

## 2017-05-14 DIAGNOSIS — R490 Dysphonia: Secondary | ICD-10-CM | POA: Diagnosis not present

## 2017-05-14 NOTE — Therapy (Addendum)
[facility] [address] [place], [place], [address] Phone: [phone]   Fax:  [phone]  Speech Language Pathology Treatment/Progress Note  Speech Therapy Progress Note   Dates of reporting period  04/09/2017   to   05/14/2017   Patient Details  Name: Daniel [name] MRN: [id number] Date of Birth: 11/14/1940 Referring Provider: HEDRICK, JAMES    Encounter Date: 05/14/2017  End of Session - 05/14/17 1242    Visit Number  9    Number of Visits  17    Date for SLP Re-Evaluation  06/09/17    SLP Start Time  0900    SLP Stop Time   0945    SLP Time Calculation (min)  45 min    Activity Tolerance  Patient tolerated treatment well       Past Medical History:  Diagnosis Date  . AF (atrial fibrillation) (HCC)   . B12 deficiency   . DH (dermatitis herpetiformis)   . Dysphagia   . Hypertension   . Prostate cancer (HCC) 2014    Past Surgical History:  Procedure Laterality Date  . APPENDECTOMY    . CARDIAC CATHETERIZATION    . COLONOSCOPY    . ESOPHAGOGASTRODUODENOSCOPY (EGD) WITH PROPOFOL N/A 10/13/2015   Procedure: ESOPHAGOGASTRODUODENOSCOPY (EGD) WITH PROPOFOL;  Surgeon: Martin U Skulskie, MD;  Location: [facility] ENDOSCOPY;  Service: Endoscopy;  Laterality: N/A;  . PACEMAKER PLACEMENT      There were no vitals filed for this visit.  Subjective Assessment - 05/14/17 1241    Subjective  Patient reports that many people are remarking on his improved voice.            ADULT SLP TREATMENT - 05/14/17 0001      General Information   Behavior/Cognition  Alert;Cooperative;Pleasant mood    HPI   77 year old man, with chronic hoarseness, referred by Dr. Hedrick for voice therapy.  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 McQueen.       Treatment Provided   Treatment provided  Cognitive-Linquistic      Pain Assessment   Pain Assessment  No/denies pain      Cognitive-Linquistic Treatment   Treatment focused on  Voice    Skilled Treatment  The patient was provided with written and verbal teaching regarding neck, shoulder, tongue, and throat stretches exercises to promote relaxed phonation. Patient reports improving range of motion.  The patient was provided with written and verbal teaching regarding breath support exercises.  Patient instructed in relaxed phonation / oral resonance. Patient reports that he is able to perceive the pharyngeal residue (shown during the [facility] 07/22/2015).  He was given the Shaker exercise- patient states he is doing the exercises.  In addition, we added loud "ah" and high effort pitch glides (effective for both voice and swallowing).  Patient able to generate a loud, good quality "ah" (76 dB - 88 dB).  Patient able to successfully lower pitch while maintaining loud, good quality voice.  Maintain loud, good quality voice for reading aloud, answering questions, and conversation. Patient able to reduce perceived nasality with loudness.  Able to read loud words with no nasal sounds with oral resonance with 100% accuracy; generate phrases with oral resonance with 80% accuracy.  Patient is less accurate with sentences containing mixed oral and nasal sounds.      Assessment / Recommendations / Plan   Plan  Continue with current plan of care      Progression   Toward Goals   Progression toward goals  Progressing toward goals       SLP Education - 05/14/17 1242    Education provided  Yes    Education Details  Reducing perceived nasality    Person(s) Educated  Patient    Methods  Explanation    Comprehension  Verbalized understanding         SLP Long Term Goals - 05/14/17 1244      SLP LONG TERM GOAL #1   Title  The patient will demonstrate independent understanding of vocal hygiene concepts and extrinsic laryngeal muscle stretches.      Status  Achieved      SLP LONG TERM GOAL #2   Title  The patient will be independent for abdominal breathing and breath  support exercises.    Status  Achieved      SLP LONG TERM GOAL #3   Title  The patient will maximize voice quality and loudness using breath support/oral resonance for sustained vowel production, pitch glides, and hierarchal speech drill.    Status  Achieved      SLP LONG TERM GOAL #4   Title  The patient will maximize voice quality and loudness using breath support/oral resonance for paragraph length recitation with 80% accuracy.    Status  Partially Met    Target Date  06/09/17      SLP LONG TERM GOAL #5   Title  The patient will reduce perceived nasality, using breath support/oral resonance, for paragraph length recitation with 80% accuracy.    Time  4    Period  Weeks    Status  New    Target Date  06/09/17       Plan - 05/14/17 1242    Clinical Impression Statement  The patient has made great gains in voice therapy.  He is able to improve vocal quality with resonant voice to improve oral resonance and loudness to decrease laryngeal strain.  Patient able to lower pitch to within one standard deviation of average for age and gender.    He is able to maintain lower pitch and loud, good quality voice for reading aloud, answering questions, and conversation. Patient able to reduce perceived hypernasality with vocal loudness.  Patient continues to gain control of perceived nasality.  Will continue ST with focus on reducing hypernasality.    Speech Therapy Frequency  2x / week    Duration  Other (comment)    Treatment/Interventions  SLP instruction and feedback;Patient/family education;Other (comment) Voice therapy    Potential Considerations  Ability to learn/carryover information;Pain level;Co-morbidities;Previous level of function;Cooperation/participation level;Severity of impairments;Medical prognosis    SLP Home Exercise Plan  neck/tongue/throat stretches; breath support exercises; resonant voice; swallowing/voice exercises; pitch glides    Consulted and Agree with Plan of Care   Patient       Patient will benefit from skilled therapeutic intervention in order to improve the following deficits and impairments:   Dysphonia    Problem List There are no active problems to display for this patient.  Susan G Abernathy, MS/CCC- SLP  Abernathy, Susie 05/14/2017, 12:46 PM  [facility] [address] [place], [place], [address] Phone: [phone]   Fax:  [phone]   Name: Hyland D Kohlenberg MRN: [id number] Date of Birth: 03/23/1940 

## 2017-05-17 ENCOUNTER — Ambulatory Visit: Payer: Medicare HMO | Admitting: Speech Pathology

## 2017-05-21 ENCOUNTER — Ambulatory Visit: Payer: Medicare HMO | Admitting: Speech Pathology

## 2017-05-31 DIAGNOSIS — E538 Deficiency of other specified B group vitamins: Secondary | ICD-10-CM | POA: Diagnosis not present

## 2017-06-05 ENCOUNTER — Encounter: Payer: Self-pay | Admitting: Speech Pathology

## 2017-06-05 ENCOUNTER — Ambulatory Visit: Payer: Medicare HMO | Attending: Family Medicine | Admitting: Speech Pathology

## 2017-06-05 DIAGNOSIS — R490 Dysphonia: Secondary | ICD-10-CM | POA: Diagnosis not present

## 2017-06-05 NOTE — Therapy (Signed)
[place] MAIN [facility] SERVICES [address] [place], [place], [address] Phone: [phone]   Fax:  [phone]  Speech Language Pathology Treatment/Discharge Summary   Speech Therapy Progress Note   Dates of reporting period  04/09/2017   to   06/05/2017   Patient Details  Name: CODEE TUTSON MRN: [id number] Date of Birth: 1940-02-28 Referring Provider: [place] Pink    Encounter Date: 06/05/2017  End of Session - 06/05/17 1321    Visit Number  10    Number of Visits  17    Date for SLP Re-Evaluation  06/09/17    SLP Start Time  1000    SLP Stop Time   1046    SLP Time Calculation (min)  46 min    Activity Tolerance  Patient tolerated treatment well       Past Medical History:  Diagnosis Date  . AF (atrial fibrillation) ([place])   . B12 deficiency   . DH (dermatitis herpetiformis)   . Dysphagia   . Hypertension   . Prostate cancer ([place]) 2014    Past Surgical History:  Procedure Laterality Date  . APPENDECTOMY    . CARDIAC CATHETERIZATION    . COLONOSCOPY    . ESOPHAGOGASTRODUODENOSCOPY (EGD) WITH PROPOFOL N/A 10/13/2015   Procedure: ESOPHAGOGASTRODUODENOSCOPY (EGD) WITH PROPOFOL;  Surgeon: [name], MD;  Location: [facility] ENDOSCOPY;  Service: Endoscopy;  Laterality: N/A;  . PACEMAKER PLACEMENT      There were no vitals filed for this visit.  Subjective Assessment - 06/05/17 1246    Subjective  Patient reports that many people are remarking on his improved voice.  (Pended)             ADULT SLP TREATMENT - 06/05/17 0001      General Information   Behavior/Cognition  Alert;Cooperative;Pleasant mood    HPI   77 year old man, with chronic hoarseness, referred by Dr. Kary Kos for voice therapy.  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 Tami Ribas.       Treatment Provided   Treatment provided  Cognitive-Linquistic      Pain Assessment   Pain Assessment  No/denies pain      Cognitive-Linquistic  Treatment   Treatment focused on  Voice    Skilled Treatment  The patient was provided with written and verbal teaching regarding neck, shoulder, tongue, and throat stretches exercises to promote relaxed phonation. Patient reports improving range of motion.  The patient was provided with written and verbal teaching regarding breath support exercises.  Patient instructed in relaxed phonation / oral resonance. Patient reports that he is able to perceive the pharyngeal residue (shown during the [facility] 07/22/2015).  He was given the Shaker exercise- patient states he is doing the exercises.  In addition, we added loud "ah" and high effort pitch glides (effective for both voice and swallowing).  Patient able to generate a loud, good quality "ah" (76 dB - 88 dB).  Habitual pitch is within 1 STD for his age and gender.  Maintain lower pitch and loud, good quality voice for reading aloud, answering questions, and conversation. Patient able to reduce perceived nasality with loudness and focus on oral resonance.  Able to read loud words, phrases, and sentences with no nasal sounds with oral resonance with 100% accuracy.  Patient continues to have difficulty with words/phrases containing mixed oral and nasal sounds.  The patient reports that he is speaking better at work, at home, in social situations, and singing  with the church choir.      Assessment / Recommendations / Plan   Plan  Discharge SLP treatment due to (comment)      Progression Toward Goals   Progression toward goals  Goals met, education completed, patient discharged from [place] Education - 06/05/17 1320    Education provided  Yes    Education Details  HEP    Person(s) Educated  Patient    Methods  Explanation;Handout    Comprehension  Verbalized understanding;Returned demonstration         SLP Long Term Goals - 06/05/17 1325      SLP LONG TERM GOAL #4   Title  The patient will maximize voice quality and loudness using breath  support/oral resonance for paragraph length recitation with 80% accuracy.    Status  Achieved      SLP LONG TERM GOAL #5   Title  The patient will reduce perceived nasality, using breath support/oral resonance, for paragraph length recitation with 80% accuracy.    Status  Achieved       Plan - 06/05/17 1324    Clinical Impression Statement  The patient has met his goals.  He is able to maintain louder, better quality voice and lowered (appropriate) pitch across multiple settings.  He has variable success with decreasing perceived hypernasality, but is much improved and has practice materials to continue working on this.    Speech Therapy Frequency  Other (comment) Discharge    Treatment/Interventions  SLP instruction and feedback;Patient/family education;Other (comment) Voice therapy    Potential to Achieve Goals  Good    Potential Considerations  Ability to learn/carryover information;Pain level;Co-morbidities;Previous level of function;Cooperation/participation level;Severity of impairments;Medical prognosis    SLP Home Exercise Plan  neck/tongue/throat stretches; breath support exercises; resonant voice; swallowing/voice exercises; pitch glides; nasality reduction    Consulted and Agree with Plan of Care  Patient       Patient will benefit from skilled therapeutic intervention in order to improve the following deficits and impairments:   Dysphonia    Problem List There are no active problems to display for this patient.  Leroy Sea, MS/CCC- SLP  Lou Miner 06/05/2017, 1:26 PM  [place] MAIN [facility] SERVICES [address] [place], [place], [address] Phone: [phone]   Fax:  [phone]   Name: LYRICK LAGRAND MRN: [id number] Date of Birth: [birth year]

## 2017-07-02 DIAGNOSIS — I495 Sick sinus syndrome: Secondary | ICD-10-CM | POA: Diagnosis not present

## 2017-07-05 DIAGNOSIS — E538 Deficiency of other specified B group vitamins: Secondary | ICD-10-CM | POA: Diagnosis not present

## 2017-07-24 DIAGNOSIS — I480 Paroxysmal atrial fibrillation: Secondary | ICD-10-CM | POA: Diagnosis not present

## 2017-07-24 DIAGNOSIS — Z9889 Other specified postprocedural states: Secondary | ICD-10-CM | POA: Diagnosis not present

## 2017-07-24 DIAGNOSIS — Z950 Presence of cardiac pacemaker: Secondary | ICD-10-CM | POA: Diagnosis not present

## 2017-08-09 DIAGNOSIS — E538 Deficiency of other specified B group vitamins: Secondary | ICD-10-CM | POA: Diagnosis not present

## 2017-09-13 DIAGNOSIS — H2513 Age-related nuclear cataract, bilateral: Secondary | ICD-10-CM | POA: Diagnosis not present

## 2017-09-13 DIAGNOSIS — H401122 Primary open-angle glaucoma, left eye, moderate stage: Secondary | ICD-10-CM | POA: Diagnosis not present

## 2017-09-13 DIAGNOSIS — H401111 Primary open-angle glaucoma, right eye, mild stage: Secondary | ICD-10-CM | POA: Diagnosis not present

## 2017-09-20 DIAGNOSIS — E538 Deficiency of other specified B group vitamins: Secondary | ICD-10-CM | POA: Diagnosis not present

## 2017-10-25 DIAGNOSIS — E538 Deficiency of other specified B group vitamins: Secondary | ICD-10-CM | POA: Diagnosis not present

## 2017-11-25 DIAGNOSIS — E538 Deficiency of other specified B group vitamins: Secondary | ICD-10-CM | POA: Diagnosis not present

## 2017-12-02 DIAGNOSIS — Z0000 Encounter for general adult medical examination without abnormal findings: Secondary | ICD-10-CM | POA: Diagnosis not present

## 2017-12-02 DIAGNOSIS — E538 Deficiency of other specified B group vitamins: Secondary | ICD-10-CM | POA: Diagnosis not present

## 2017-12-02 DIAGNOSIS — I480 Paroxysmal atrial fibrillation: Secondary | ICD-10-CM | POA: Diagnosis not present

## 2017-12-02 DIAGNOSIS — R490 Dysphonia: Secondary | ICD-10-CM | POA: Diagnosis not present

## 2017-12-02 DIAGNOSIS — R634 Abnormal weight loss: Secondary | ICD-10-CM | POA: Diagnosis not present

## 2017-12-02 DIAGNOSIS — Z1211 Encounter for screening for malignant neoplasm of colon: Secondary | ICD-10-CM | POA: Diagnosis not present

## 2017-12-02 DIAGNOSIS — R531 Weakness: Secondary | ICD-10-CM | POA: Diagnosis not present

## 2017-12-02 DIAGNOSIS — D649 Anemia, unspecified: Secondary | ICD-10-CM | POA: Diagnosis not present

## 2017-12-02 DIAGNOSIS — R1310 Dysphagia, unspecified: Secondary | ICD-10-CM | POA: Diagnosis not present

## 2017-12-03 DIAGNOSIS — I495 Sick sinus syndrome: Secondary | ICD-10-CM | POA: Diagnosis not present

## 2017-12-25 DIAGNOSIS — E538 Deficiency of other specified B group vitamins: Secondary | ICD-10-CM | POA: Diagnosis not present

## 2018-01-01 DIAGNOSIS — D649 Anemia, unspecified: Secondary | ICD-10-CM | POA: Diagnosis not present

## 2018-01-07 ENCOUNTER — Other Ambulatory Visit: Payer: Self-pay | Admitting: Gastroenterology

## 2018-01-07 DIAGNOSIS — R0602 Shortness of breath: Secondary | ICD-10-CM

## 2018-01-07 DIAGNOSIS — C61 Malignant neoplasm of prostate: Secondary | ICD-10-CM

## 2018-01-07 DIAGNOSIS — Z86010 Personal history of colonic polyps: Secondary | ICD-10-CM | POA: Diagnosis not present

## 2018-01-07 DIAGNOSIS — R634 Abnormal weight loss: Secondary | ICD-10-CM

## 2018-01-20 ENCOUNTER — Ambulatory Visit
Admission: RE | Admit: 2018-01-20 | Discharge: 2018-01-20 | Disposition: A | Discharge status: Home / Self Care | Payer: Medicare HMO | Source: Ambulatory Visit | Attending: Gastroenterology | Admitting: Gastroenterology

## 2018-01-20 DIAGNOSIS — R0602 Shortness of breath: Secondary | ICD-10-CM | POA: Insufficient documentation

## 2018-01-20 DIAGNOSIS — C61 Malignant neoplasm of prostate: Secondary | ICD-10-CM | POA: Diagnosis not present

## 2018-01-20 DIAGNOSIS — N323 Diverticulum of bladder: Secondary | ICD-10-CM | POA: Diagnosis not present

## 2018-01-20 DIAGNOSIS — R911 Solitary pulmonary nodule: Secondary | ICD-10-CM | POA: Diagnosis not present

## 2018-01-20 DIAGNOSIS — R634 Abnormal weight loss: Secondary | ICD-10-CM | POA: Diagnosis not present

## 2018-01-20 MED ORDER — IOHEXOL 300 MG/ML  SOLN
100.0000 mL | Freq: Once | INTRAMUSCULAR | Status: AC | PRN
  Administered 2018-01-20: 100 mL via INTRAVENOUS

## 2018-01-31 DIAGNOSIS — E538 Deficiency of other specified B group vitamins: Secondary | ICD-10-CM | POA: Diagnosis not present

## 2018-02-03 DIAGNOSIS — Z872 Personal history of diseases of the skin and subcutaneous tissue: Secondary | ICD-10-CM | POA: Diagnosis not present

## 2018-02-03 DIAGNOSIS — X32XXXA Exposure to sunlight, initial encounter: Secondary | ICD-10-CM | POA: Diagnosis not present

## 2018-02-03 DIAGNOSIS — Z08 Encounter for follow-up examination after completed treatment for malignant neoplasm: Secondary | ICD-10-CM | POA: Diagnosis not present

## 2018-02-03 DIAGNOSIS — D225 Melanocytic nevi of trunk: Secondary | ICD-10-CM | POA: Diagnosis not present

## 2018-02-03 DIAGNOSIS — L570 Actinic keratosis: Secondary | ICD-10-CM | POA: Diagnosis not present

## 2018-02-03 DIAGNOSIS — Z85828 Personal history of other malignant neoplasm of skin: Secondary | ICD-10-CM | POA: Diagnosis not present

## 2018-02-03 DIAGNOSIS — D2262 Melanocytic nevi of left upper limb, including shoulder: Secondary | ICD-10-CM | POA: Diagnosis not present

## 2018-02-03 DIAGNOSIS — D2261 Melanocytic nevi of right upper limb, including shoulder: Secondary | ICD-10-CM | POA: Diagnosis not present

## 2018-02-24 DIAGNOSIS — H2513 Age-related nuclear cataract, bilateral: Secondary | ICD-10-CM | POA: Diagnosis not present

## 2018-02-24 DIAGNOSIS — H401122 Primary open-angle glaucoma, left eye, moderate stage: Secondary | ICD-10-CM | POA: Diagnosis not present

## 2018-02-24 DIAGNOSIS — H401111 Primary open-angle glaucoma, right eye, mild stage: Secondary | ICD-10-CM | POA: Diagnosis not present

## 2018-02-26 DIAGNOSIS — R972 Elevated prostate specific antigen [PSA]: Secondary | ICD-10-CM | POA: Diagnosis not present

## 2018-02-26 DIAGNOSIS — Z79899 Other long term (current) drug therapy: Secondary | ICD-10-CM | POA: Diagnosis not present

## 2018-02-26 DIAGNOSIS — D400 Neoplasm of uncertain behavior of prostate: Secondary | ICD-10-CM | POA: Diagnosis not present

## 2018-02-26 DIAGNOSIS — R3914 Feeling of incomplete bladder emptying: Secondary | ICD-10-CM | POA: Diagnosis not present

## 2018-02-26 DIAGNOSIS — C61 Malignant neoplasm of prostate: Secondary | ICD-10-CM | POA: Diagnosis not present

## 2018-03-03 DIAGNOSIS — E538 Deficiency of other specified B group vitamins: Secondary | ICD-10-CM | POA: Diagnosis not present

## 2018-03-08 ENCOUNTER — Encounter: Payer: Self-pay | Admitting: Emergency Medicine

## 2018-03-08 ENCOUNTER — Emergency Department: Payer: Medicare HMO

## 2018-03-08 ENCOUNTER — Other Ambulatory Visit: Payer: Self-pay

## 2018-03-08 ENCOUNTER — Emergency Department
Admission: EM | Admit: 2018-03-08 | Discharge: 2018-03-08 | Disposition: A | Discharge status: Home / Self Care | Payer: Medicare HMO | Attending: Emergency Medicine | Admitting: Emergency Medicine

## 2018-03-08 DIAGNOSIS — W010XXA Fall on same level from slipping, tripping and stumbling without subsequent striking against object, initial encounter: Secondary | ICD-10-CM | POA: Insufficient documentation

## 2018-03-08 DIAGNOSIS — S0990XA Unspecified injury of head, initial encounter: Secondary | ICD-10-CM | POA: Diagnosis not present

## 2018-03-08 DIAGNOSIS — Z7982 Long term (current) use of aspirin: Secondary | ICD-10-CM | POA: Diagnosis not present

## 2018-03-08 DIAGNOSIS — M25562 Pain in left knee: Secondary | ICD-10-CM | POA: Diagnosis not present

## 2018-03-08 DIAGNOSIS — I4891 Unspecified atrial fibrillation: Secondary | ICD-10-CM | POA: Diagnosis not present

## 2018-03-08 DIAGNOSIS — Z79899 Other long term (current) drug therapy: Secondary | ICD-10-CM | POA: Diagnosis not present

## 2018-03-08 DIAGNOSIS — S0083XA Contusion of other part of head, initial encounter: Secondary | ICD-10-CM

## 2018-03-08 DIAGNOSIS — M25462 Effusion, left knee: Secondary | ICD-10-CM | POA: Diagnosis not present

## 2018-03-08 DIAGNOSIS — S61511A Laceration without foreign body of right wrist, initial encounter: Secondary | ICD-10-CM | POA: Diagnosis not present

## 2018-03-08 DIAGNOSIS — S8992XA Unspecified injury of left lower leg, initial encounter: Secondary | ICD-10-CM | POA: Diagnosis not present

## 2018-03-08 DIAGNOSIS — R52 Pain, unspecified: Secondary | ICD-10-CM | POA: Diagnosis not present

## 2018-03-08 DIAGNOSIS — S80919A Unspecified superficial injury of unspecified knee, initial encounter: Secondary | ICD-10-CM | POA: Diagnosis not present

## 2018-03-08 DIAGNOSIS — Y9301 Activity, walking, marching and hiking: Secondary | ICD-10-CM | POA: Diagnosis not present

## 2018-03-08 DIAGNOSIS — Y92009 Unspecified place in unspecified non-institutional (private) residence as the place of occurrence of the external cause: Secondary | ICD-10-CM | POA: Insufficient documentation

## 2018-03-08 DIAGNOSIS — M79605 Pain in left leg: Secondary | ICD-10-CM | POA: Diagnosis not present

## 2018-03-08 DIAGNOSIS — S0993XA Unspecified injury of face, initial encounter: Secondary | ICD-10-CM | POA: Diagnosis not present

## 2018-03-08 DIAGNOSIS — R58 Hemorrhage, not elsewhere classified: Secondary | ICD-10-CM | POA: Diagnosis not present

## 2018-03-08 DIAGNOSIS — S0031XA Abrasion of nose, initial encounter: Secondary | ICD-10-CM | POA: Diagnosis not present

## 2018-03-08 DIAGNOSIS — Y998 Other external cause status: Secondary | ICD-10-CM | POA: Insufficient documentation

## 2018-03-08 MED ORDER — METOPROLOL TARTRATE 25 MG PO TABS
12.5000 mg | ORAL_TABLET | Freq: Once | ORAL | Status: AC
  Administered 2018-03-08: 12.5 mg via ORAL
  Filled 2018-03-08: qty 1

## 2018-03-08 NOTE — Discharge Instructions (Signed)
Please follow-up closely with Dr. Sabra Heck, also please let your primary care doctor know that the radiologist feels at some point you may want an ultrasound of that knee to further look at the arteries.  That does not need to happen today.  Your heart rate was somewhat elevated here, but you had just fallen and had not taken your blood pressure medication yet, we are giving her blood pressure medication to your here.  Please continue to take it as directed but do not take the dose tonight, if you have chest pain shortness of breath or feel lightheaded or have other concerns return to the ER.

## 2018-03-08 NOTE — ED Notes (Signed)
Pt's wound cleansed and covered with dry sterile dressings.

## 2018-03-08 NOTE — ED Provider Notes (Addendum)
[facility] Emergency Department Provider Note  ____________________________________________   I have reviewed the triage vital signs and the nursing notes. Where available I have reviewed prior notes and, if possible and indicated, outside hospital notes.    HISTORY  Chief Complaint Fall    HPI Daniel [name] is a 78 y.o. male who presents today complaining of a non-syncopal fall.  He states he was carrying a salad and tripped on a chair and bumped his face.  He did not pass out.  He complains of some minimal pain to the left knee, no hip pain, he states he hit his head however and he has an abrasion to the nose and abrasion to the right wrist.  He has no wrist pain he is full painless range of motion of the wrist, he has no nausea or vomiting change in vision or hearing no neck pain no back pain he states he feels "okay".  He states that his left knee is somewhat tender as well.    Past Medical History:  Diagnosis Date  . AF (atrial fibrillation) ([place])   . B12 deficiency   . DH (dermatitis herpetiformis)   . Dysphagia   . Hypertension   . Prostate cancer ([place]) 2014    There are no active problems to display for this patient.   Past Surgical History:  Procedure Laterality Date  . APPENDECTOMY    . CARDIAC CATHETERIZATION    . COLONOSCOPY    . ESOPHAGOGASTRODUODENOSCOPY (EGD) WITH PROPOFOL N/A 10/13/2015   Procedure: ESOPHAGOGASTRODUODENOSCOPY (EGD) WITH PROPOFOL;  Surgeon: [name], MD;  Location: [facility] ENDOSCOPY;  Service: Endoscopy;  Laterality: N/A;  . PACEMAKER PLACEMENT      Prior to Admission medications   Medication Sig Start Date End Date Taking? Authorizing Provider  aspirin 81 MG chewable tablet Chew 81 mg by mouth once.    [provider]  chlorhexidine (PERIDEX) 0.12 % solution Use as directed 15 mLs in the mouth or throat 2 (two) times daily.    [provider]  cyanocobalamin 1000 MCG tablet Take 1,000 mcg by  mouth daily.    [provider]  dapsone 25 MG tablet Take 25 mg by mouth daily.    [provider]  latanoprost (XALATAN) 0.005 % ophthalmic solution 1 drop at bedtime.    [provider]  metoprolol succinate (TOPROL-XL) 25 MG 24 hr tablet Take 25 mg by mouth daily.    [provider]    Allergies Patient has no known allergies.  History reviewed. No pertinent family history.  Social History Social History   Tobacco Use  . Smoking status: Never Smoker  . Smokeless tobacco: Never Used  Substance Use Topics  . Alcohol use: No  . Drug use: No    Review of Systems Constitutional: No fever/chills Eyes: No visual changes. ENT: No sore throat. No stiff neck no neck pain Cardiovascular: Denies chest pain. Respiratory: Denies shortness of breath. Gastrointestinal:   no vomiting.  No diarrhea.  No constipation. Genitourinary: Negative for dysuria. Musculoskeletal: Negative lower extremity swelling Skin: Negative for rash. Neurological: Negative for severe headaches, focal weakness or numbness.   ____________________________________________   PHYSICAL EXAM:  VITAL SIGNS: ED Triage Vitals  Enc Vitals Group     BP 03/08/18 1923 121/74     Pulse Rate 03/08/18 1923 (!) 137     Resp 03/08/18 1923 18     Temp 03/08/18 1923 99.7 F (37.6 C)     Temp  Source 03/08/18 1923 Oral     SpO2 03/08/18 1923 98 %     Weight 03/08/18 1924 155 lb (70.3 kg)     Height 03/08/18 1924 6\' 2"  (1.88 m)     Head Circumference --      Peak Flow --      Pain Score 03/08/18 1924 4     Pain Loc --      Pain Edu? --      Excl. in [place]? --     Constitutional: Alert and oriented. Well appearing and in no acute distress. Eyes: Conjunctivae are normal Head: There is superficial abrasion to the bridge of the nose as well as above the eyes on the eyebrows likely from his glasses. HEENT: No congestion/rhinnorhea. Mucous membranes are moist.  Oropharynx  non-erythematous Neck:   Nontender with no meningismus, no masses, no stridor Cardiovascular: irreg irreg mild tachycardia noted. Grossly normal heart sounds.  Good peripheral circulation. Respiratory: Normal respiratory effort.  No retractions. Lungs CTAB. Abdominal: Soft and nontender. No distention. No guarding no rebound Back:  There is no focal tenderness or step off.  there is no midline tenderness there are no lesions noted. there is no CVA tenderness Musculoskeletal: Some mild left knee pain no hip pain full range of motion of all extremities, low suspicion for fracture no upper extremity tenderness. No joint effusions, no DVT signs strong distal pulses no edema Neurologic:  Normal speech and language. No gross focal neurologic deficits are appreciated.  Skin:  Skin is warm, dry and intact.  Above there is also a bit of an abrasion superficial to the right wrist. Psychiatric: Mood and affect are little anxious.[place] Kitchen Speech and behavior are normal.  ____________________________________________   LABS (all labs ordered are listed, but only abnormal results are displayed)  Labs Reviewed - No data to display  Pertinent labs  results that were available during my care of the patient were reviewed by me and considered in my medical decision making (see chart for details). ____________________________________________  EKG  I personally interpreted any EKGs ordered by me or triage Atrial fibrillation 120 no acute ST elevation impression nonspecific ST changes ____________________________________________  RADIOLOGY  Pertinent labs & imaging results that were available during my care of the patient were reviewed by me and considered in my medical decision making (see chart for details). If possible, patient and/or family made aware of any abnormal findings.  No results found. ____________________________________________    PROCEDURES  Procedure(s) performed:  None  Procedures  Critical Care performed: None  ____________________________________________   INITIAL IMPRESSION / ASSESSMENT AND PLAN / ED COURSE  Pertinent labs & imaging results that were available during my care of the patient were reviewed by me and considered in my medical decision making (see chart for details).  Here after a non-syncopal fall, has obvious head trauma but is neurologically intact, not known to be on blood thinners but from baby aspirin, we will get CT head and face, he has no neck pain.  He has no wrist pain, we will also get an x-ray of his left knee.  He had no cardiogenic component to this fall and he tripped on a chair.  ----------------------------------------- 8:36 PM on 03/08/2018 -----------------------------------------  Patient heart rate is about 1 15-1 25, he did not take his metoprolol today, he was going to take it when he got back from church, states that this is not unusual for his heart rate to go up like this no evidence of ischemia,  this was a non-syncopal event, His pressure is holding, I will give him his home medications we will watch his pressure while we await the results.  ----------------------------------------- 8:58 PM on 03/08/2018 -----------------------------------------  Signed out ot dr. Archie Balboa at the end of my shift.  States that he is always in atrial fibrillation, Tdap is up-to-date, I did make him and family aware of the radiographic findings and we will ensure that he can ambulate prior to discharge   ____________________________________________   FINAL CLINICAL IMPRESSION(S) / ED DIAGNOSES  Final diagnoses:  None      This chart was dictated using voice recognition software.  Despite best efforts to proofread,  errors can occur which can change meaning.      Schuyler Amor, MD 03/08/18 Patrecia Pour    Schuyler Amor, MD 03/08/18 2028    Schuyler Amor, MD 03/08/18 2037    Schuyler Amor,  MD 03/08/18 [id number]    Schuyler Amor, MD 03/08/18 2101

## 2018-03-08 NOTE — ED Notes (Signed)
Report received, assumed care of patient.

## 2018-03-08 NOTE — ED Triage Notes (Signed)
Pt was carrying a salad to the table at church, tripped over chair and hit his face on the floor. Has small abrasions to his forehead. Bandage on nose, but no wound under the bandaid. Also c/o left knee pain when walking but not with palpation.

## 2018-03-10 ENCOUNTER — Other Ambulatory Visit: Payer: Self-pay | Admitting: Student

## 2018-03-10 DIAGNOSIS — M7042 Prepatellar bursitis, left knee: Secondary | ICD-10-CM | POA: Diagnosis not present

## 2018-03-10 DIAGNOSIS — M25562 Pain in left knee: Secondary | ICD-10-CM

## 2018-03-10 DIAGNOSIS — Z9181 History of falling: Secondary | ICD-10-CM | POA: Diagnosis not present

## 2018-03-11 ENCOUNTER — Encounter: Admission: RE | Payer: Self-pay | Source: Home / Self Care

## 2018-03-11 ENCOUNTER — Ambulatory Visit: Admission: RE | Admit: 2018-03-11 | Payer: Medicare HMO | Source: Home / Self Care | Admitting: Gastroenterology

## 2018-03-11 SURGERY — ESOPHAGOGASTRODUODENOSCOPY (EGD) WITH PROPOFOL
Anesthesia: General

## 2018-03-13 ENCOUNTER — Ambulatory Visit
Admission: RE | Admit: 2018-03-13 | Discharge: 2018-03-13 | Disposition: A | Discharge status: Home / Self Care | Payer: Medicare HMO | Source: Ambulatory Visit | Attending: Student | Admitting: Student

## 2018-03-13 DIAGNOSIS — M25562 Pain in left knee: Secondary | ICD-10-CM

## 2018-03-13 DIAGNOSIS — I724 Aneurysm of artery of lower extremity: Secondary | ICD-10-CM | POA: Diagnosis not present

## 2018-04-04 DIAGNOSIS — E538 Deficiency of other specified B group vitamins: Secondary | ICD-10-CM | POA: Diagnosis not present

## 2018-05-27 ENCOUNTER — Ambulatory Visit: Admit: 2018-05-27 | Payer: Medicare HMO | Admitting: Gastroenterology

## 2018-05-27 SURGERY — ESOPHAGOGASTRODUODENOSCOPY (EGD) WITH PROPOFOL
Anesthesia: General

## 2018-06-09 DIAGNOSIS — H2513 Age-related nuclear cataract, bilateral: Secondary | ICD-10-CM | POA: Diagnosis not present

## 2018-06-09 DIAGNOSIS — H401111 Primary open-angle glaucoma, right eye, mild stage: Secondary | ICD-10-CM | POA: Diagnosis not present

## 2018-06-09 DIAGNOSIS — H401122 Primary open-angle glaucoma, left eye, moderate stage: Secondary | ICD-10-CM | POA: Diagnosis not present

## 2018-06-09 DIAGNOSIS — H10233 Serous conjunctivitis, except viral, bilateral: Secondary | ICD-10-CM | POA: Diagnosis not present

## 2018-06-19 DIAGNOSIS — I495 Sick sinus syndrome: Secondary | ICD-10-CM | POA: Diagnosis not present

## 2018-08-26 DIAGNOSIS — I480 Paroxysmal atrial fibrillation: Secondary | ICD-10-CM | POA: Diagnosis not present

## 2018-08-26 DIAGNOSIS — E538 Deficiency of other specified B group vitamins: Secondary | ICD-10-CM | POA: Diagnosis not present

## 2018-08-26 DIAGNOSIS — R5383 Other fatigue: Secondary | ICD-10-CM | POA: Diagnosis not present

## 2018-08-26 DIAGNOSIS — R490 Dysphonia: Secondary | ICD-10-CM | POA: Diagnosis not present

## 2018-08-26 DIAGNOSIS — R2681 Unsteadiness on feet: Secondary | ICD-10-CM | POA: Diagnosis not present

## 2018-09-03 DIAGNOSIS — R000 Tachycardia, unspecified: Secondary | ICD-10-CM | POA: Diagnosis not present

## 2018-09-03 DIAGNOSIS — R531 Weakness: Secondary | ICD-10-CM | POA: Diagnosis not present

## 2018-09-03 DIAGNOSIS — I482 Chronic atrial fibrillation, unspecified: Secondary | ICD-10-CM | POA: Diagnosis not present

## 2018-09-03 DIAGNOSIS — Z950 Presence of cardiac pacemaker: Secondary | ICD-10-CM | POA: Diagnosis not present

## 2018-09-03 DIAGNOSIS — R0602 Shortness of breath: Secondary | ICD-10-CM | POA: Diagnosis not present

## 2018-09-03 DIAGNOSIS — Z9889 Other specified postprocedural states: Secondary | ICD-10-CM | POA: Diagnosis not present

## 2018-09-03 DIAGNOSIS — R5383 Other fatigue: Secondary | ICD-10-CM | POA: Diagnosis not present

## 2018-09-03 DIAGNOSIS — E538 Deficiency of other specified B group vitamins: Secondary | ICD-10-CM | POA: Diagnosis not present

## 2018-09-12 DIAGNOSIS — R0602 Shortness of breath: Secondary | ICD-10-CM | POA: Diagnosis not present

## 2018-09-15 DIAGNOSIS — I482 Chronic atrial fibrillation, unspecified: Secondary | ICD-10-CM | POA: Diagnosis not present

## 2018-09-15 DIAGNOSIS — Z950 Presence of cardiac pacemaker: Secondary | ICD-10-CM | POA: Diagnosis not present

## 2018-09-15 DIAGNOSIS — Z45018 Encounter for adjustment and management of other part of cardiac pacemaker: Secondary | ICD-10-CM | POA: Diagnosis not present

## 2018-09-15 DIAGNOSIS — R1313 Dysphagia, pharyngeal phase: Secondary | ICD-10-CM | POA: Diagnosis not present

## 2018-09-15 DIAGNOSIS — Z9889 Other specified postprocedural states: Secondary | ICD-10-CM | POA: Diagnosis not present

## 2018-09-15 DIAGNOSIS — R0602 Shortness of breath: Secondary | ICD-10-CM | POA: Diagnosis not present

## 2018-09-22 ENCOUNTER — Other Ambulatory Visit: Payer: Self-pay

## 2018-09-22 ENCOUNTER — Ambulatory Visit: Payer: Medicare HMO | Attending: Student | Admitting: Physical Therapy

## 2018-09-22 DIAGNOSIS — R2689 Other abnormalities of gait and mobility: Secondary | ICD-10-CM | POA: Diagnosis not present

## 2018-09-22 DIAGNOSIS — R269 Unspecified abnormalities of gait and mobility: Secondary | ICD-10-CM

## 2018-09-22 DIAGNOSIS — M6281 Muscle weakness (generalized): Secondary | ICD-10-CM | POA: Diagnosis not present

## 2018-09-22 DIAGNOSIS — M2560 Stiffness of unspecified joint, not elsewhere classified: Secondary | ICD-10-CM | POA: Diagnosis not present

## 2018-09-22 NOTE — Patient Instructions (Signed)
Access Code: [email or web address]  URL: [email or web address]  Date: 09/22/2018  Prepared by: Dorcas Carrow   Exercises  Standing Hip Abduction with Counter Support - 20 reps - 2 sets - 1x daily - 4x weekly  Standing Hip Extension with Counter Support - 20 reps - 2 sets - 1x daily - 4x weekly  Standing March with Unilateral Counter Support - 10 reps - 2 sets - 1x daily - 4x weekly  Seated Hamstring Set - 2 reps - 1 sets - 30 hold - 1x daily - 7x weekly  Modified Thomas Stretch - 2 reps - 1 sets - 30 hold - 1x daily - 7x weekly  Standing Hip Flexor Stretch - 2 reps - 1 sets - 30 hold - 1x daily - 7x weekly

## 2018-09-22 NOTE — Therapy (Addendum)
[place] [facility] [address] [place], [place], [address] Phone: [phone]   Fax:  [phone]  Physical Therapy Evaluation  Patient Details  Name: Daniel [name] MRN: [id number] Date of Birth: 1940-03-24 Referring Provider (PT): [name], [place]   Encounter Date: 09/22/2018  PT End of Session - 09/22/18 1347    Visit Number  1    Number of Visits  8    Date for PT Re-Evaluation  10/20/18    Authorization - Visit Number  1    Authorization - Number of Visits  10    PT Start Time  1109    PT Stop Time  1205    PT Time Calculation (min)  56 min    Activity Tolerance  Patient tolerated treatment well    Behavior During Therapy  [facility] for tasks assessed/performed       Past Medical History:  Diagnosis Date  . AF (atrial fibrillation) ([place])   . B12 deficiency   . DH (dermatitis herpetiformis)   . Dysphagia   . Hypertension   . Prostate cancer ([place]) 2014    Past Surgical History:  Procedure Laterality Date  . APPENDECTOMY    . CARDIAC CATHETERIZATION    . COLONOSCOPY    . ESOPHAGOGASTRODUODENOSCOPY (EGD) WITH PROPOFOL N/A 10/13/2015   Procedure: ESOPHAGOGASTRODUODENOSCOPY (EGD) WITH PROPOFOL;  Surgeon: [name], MD;  Location: [facility] ENDOSCOPY;  Service: Endoscopy;  Laterality: N/A;  . PACEMAKER PLACEMENT      There were no vitals filed for this visit.   Subjective Assessment - 09/23/18 0902    Subjective  Pt states he started "Losing his voice and losing weight" in 2018. Pt reports he saw ENT and gastroenterologist and was diagnosed with celiac disease. Pt states he then became "weaker and weaker" and reports he has trouble with the following activities: getting out of bed, performing sit<>stands. Pt reports he was evaluated to see if he has Parkinson's. Pt reports 2 falls in last 6 months and states first fall occurred tripping over a chair in church and that the second fall occurred when he stepped sideways to  avoid a car in a parking lot. Pt states goals for therapy: "I'd like to regain strength in my arms and legs." Pt denies pain, dizziness, and N/T. Pt reports he is active and takes approx. 10,000 steps a day.    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 [name] to get B12 shot.  Pt states he has had physical therapy in the past for L shoulder after a "minor surgery."    Limitations  Standing;House hold activities    Patient Stated Goals  Pt reports he would like to increase strength in LEs and UEs.    Currently in Pain?  No/denies        MMT: Grossly 5/5 in LEs except  4+/5 B hip flexors 4/5 B hip abductors  4+/5 B dorsiflexors   Sensation: Intact in B LES  Proprioception intact in B LEs  ROM/Hamstring length: L 155 deg (lacking 60 deg)  R 145 deg (lacking 55 deg)  Posture/gait: Pt with increased hip flexion with stance and gait. Pt WNL gait speed but decreased step length B appears to have increased cadence.  SPECIAL TESTS: 5xsts - 17.81 sec   -- requires use of UA assist. Poor eccentric control w UE assist. Multiple attempts 18mwt -  1.2 m/s Berg- 45/56 -  Difficulty SLB, multiple steps for 360 turns, unable to maintain tandem, difficulty with sit<>stand (must use ue assist/multiple atempts) and difficulty with eccentric component   Therapeutic Exercise: See HEP        PT Education - 09/23/18 0835    Education Details  Pt educated on HEP    Person(s) Educated  Patient    Methods  Explanation;Demonstration;Handout    Comprehension  Verbalized understanding;Returned demonstration          PT Long Term Goals - 09/23/18 0909      PT LONG TERM GOAL #1   Title  Pt will demonstrate improved functional mobility by scoring at least 62 on FOTO    Baseline  PT FOTO score 37 (age norm 35) 09/22/2018    Time  4    Period  Weeks    Status  New    Target Date  10/20/18      PT LONG TERM GOAL #2   Title  Pt will demonstrate  increased LE strength by improving B hip flexors, B hip abductors, and B dorsiflexors by 1/2 MMT grade    Baseline  4+/5 hip flexors 4/5 hip abductors 4+/5 dorsiflexors 09/22/2018    Time  4    Period  Weeks    Status  New    Target Date  10/20/18      PT LONG TERM GOAL #3   Title  Pt will perform 5xSTS in 12 seconds or less to demonstrate improved B LE power to improve ease with standing up from a car seat.    Baseline  5xsts: 17.81 sec with use of UE assist and multiple attempts to perform stand 09/22/2018    Time  4    Period  Weeks    Status  New    Target Date  10/20/18      PT LONG TERM GOAL #4   Title  Pt will score at least a 52/56 on the Berg in order to demonstrate decreased risk for falls    Baseline  Berg: 45/56 09/22/2018    Time  4    Period  Weeks    Status  New    Target Date  10/20/18         Plan - 09/23/18 0904    Clinical Impression Statement  Pt is a pleasant 78 y/o male presenting to therapy with c/o LE weakness, decreased functional mobility, and impaired balance. Pt FOTO score is 78 (age norm is 37), indicating decreased functional mobility. Pt MMT reveals weakness in the following muscles: B hip flexors 4+/5, B hip abductors 4/5, and B dorsiflexors 4+/5, with rest of LE muscles grossly 5/5. Pt sensation and proprioception is intact in B LEs. Pt with decreased hamstring length B: 150 deg L LE (lacking 60 deg.), and 145 deg R LE (lacking 55 deg.). Pt with increased LE muscle tremors when SPT performed PROM on pt for hamstring length measurements.  Pt also with difficulty with performing supine->sidelying->sit, demonstrating rigidity and decreased truncal dissociation, suggestive of increased rigidity. Pt with inconsistent performance of sit<>stands and reported and demonstrated greater difficulty with sit<>stands, with multiple unsuccessful attempts to stand, when pt states he "thought about it too much," and reported/demonstrated increased ease with sit<>stands when  he states he "didn't think about it." Pt 5xSTS 17.81 seconds using UE assist and with difficulty initiating stands, requiring multiple attempts with some reps. Pt 5xSTS score indicates decreased B LE power. 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 Pt Berg score 45/56, indicating pt is at increased fall risk and is appropriate for using a SPC. Throughout exam pt with delayed response time to commands and difficulty with speech. Pt will benefit from further skilled therapy to increase LE strength, ROM, and to decrease risk of falls.    Personal Factors and Comorbidities  Age;Comorbidity 1    Comorbidities  neurological disorder    Examination-Activity Limitations  Bed Mobility;Transfers    Examination-Participation Restrictions  Community Activity    Stability/Clinical Decision Making  Evolving/Moderate complexity    Clinical Decision Making  Moderate    Rehab Potential  Fair    PT Frequency  2x / week    PT Duration  4 weeks    PT Treatment/Interventions  ADLs/Self Care Home Management;Cryotherapy;Electrical Stimulation;Moist Heat;Traction;DME Instruction;Gait training;Stair training;Functional mobility training;Therapeutic activities;Therapeutic exercise;Balance training;Neuromuscular re-education;Patient/family education;Orthotic Fit/Training;Manual techniques;Passive range of motion;Dry needling;Energy conservation;Joint Manipulations    PT Next Visit Plan  LE ROM exercises and static balance exercises    Consulted and Agree with Plan of Care  Patient       Patient will benefit from skilled therapeutic intervention in order to improve the following deficits and impairments:  Abnormal gait, Improper body mechanics, Decreased coordination, Decreased mobility, Postural dysfunction, Decreased activity tolerance, Decreased endurance, Decreased range of motion, Decreased strength, Hypomobility, Decreased balance, Difficulty walking, Impaired  flexibility, Impaired tone  Visit Diagnosis: Muscle weakness (generalized)  Joint stiffness  Gait difficulty  Balance disorder     Problem List There are no active problems to display for this patient.  Pura Spice, PT, DPT # [id number] Ricard Dillon, SPT 09/23/2018, 9:56 AM  [place] [facility] [address] [place], [place], [address] Phone: [phone]   Fax:  [phone]  Name: Daniel [name] MRN: [id number] Date of Birth: 1940/12/23

## 2018-09-23 ENCOUNTER — Encounter: Payer: Self-pay | Admitting: Physical Therapy

## 2018-09-23 NOTE — Addendum Note (Signed)
Addended by: Dorcas Carrow C on: 09/23/2018 10:00 AM   Modules accepted: Orders

## 2018-09-26 ENCOUNTER — Other Ambulatory Visit: Payer: Self-pay

## 2018-09-26 ENCOUNTER — Encounter: Payer: Self-pay | Admitting: Physical Therapy

## 2018-09-26 ENCOUNTER — Ambulatory Visit: Payer: Medicare HMO | Attending: Student | Admitting: Physical Therapy

## 2018-09-26 DIAGNOSIS — M2560 Stiffness of unspecified joint, not elsewhere classified: Secondary | ICD-10-CM | POA: Diagnosis not present

## 2018-09-26 DIAGNOSIS — R269 Unspecified abnormalities of gait and mobility: Secondary | ICD-10-CM | POA: Diagnosis not present

## 2018-09-26 DIAGNOSIS — R2689 Other abnormalities of gait and mobility: Secondary | ICD-10-CM | POA: Insufficient documentation

## 2018-09-26 DIAGNOSIS — M6281 Muscle weakness (generalized): Secondary | ICD-10-CM | POA: Diagnosis not present

## 2018-09-26 NOTE — Therapy (Signed)
[place] [facility] [address] [place], [place], [address] Phone: [phone]   Fax:  [phone]  Physical Therapy Treatment  Patient Details  Name: Daniel [name] MRN: [id number] Date of Birth: 05-25-1940 Referring Provider (PT): [name], [place]   Encounter Date: 09/26/2018  PT End of Session - 09/26/18 1118    Visit Number  2    Number of Visits  8    Date for PT Re-Evaluation  10/20/18    Authorization - Visit Number  2    Authorization - Number of Visits  10    PT Start Time  0901    PT Stop Time  0948    PT Time Calculation (min)  47 min    Equipment Utilized During Treatment  Gait belt    Activity Tolerance  Patient tolerated treatment well    Behavior During Therapy  [facility] for tasks assessed/performed       Past Medical History:  Diagnosis Date  . AF (atrial fibrillation) ([place])   . B12 deficiency   . DH (dermatitis herpetiformis)   . Dysphagia   . Hypertension   . Prostate cancer ([place]) 2014    Past Surgical History:  Procedure Laterality Date  . APPENDECTOMY    . CARDIAC CATHETERIZATION    . COLONOSCOPY    . ESOPHAGOGASTRODUODENOSCOPY (EGD) WITH PROPOFOL N/A 10/13/2015   Procedure: ESOPHAGOGASTRODUODENOSCOPY (EGD) WITH PROPOFOL;  Surgeon: [name], MD;  Location: [facility] ENDOSCOPY;  Service: Endoscopy;  Laterality: N/A;  . PACEMAKER PLACEMENT      There were no vitals filed for this visit.  Subjective Assessment - 09/26/18 1117    Subjective  Pt without reports of pain presenting to therapy today. Pt states he has been performing HEP "every day" since previous session.    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 [name] to get B12 shot.  Pt states he has had physical therapy in the past for L shoulder after a "minor surgery."    Limitations  Standing;House hold activities    Patient Stated Goals  Pt reports he would like to increase strength in LEs  and UEs.    Currently in Pain?  No/denies        Therapeutic Exercise: Nustep, L3, 10 min Supine>sidelying>sit - max cuing and min assist provided. Modified sit<>stands from increased height 2x10. Frequent verbal and tactile cuing for technique. Pt with difficulty initiating movement. Seated lunges with emphasis on increased trunk rotation and UE extension and flexion - 2x10. Max verbal and tactile cuing required. Seated marches - 2x20. Cuing provided for technique.  Manual: Pt supine hamstring, piriformis, and PF stretching x multiple repetitions each for 30 sec bouts Pt supine trunk rotations - x multiple reps. Pt with pain of L hip with rotation to R that resolved out of position.     PT Education - 09/26/18 1118    Education Details  Pt educated on sit<>stand technique    Person(s) Educated  Patient    Methods  Explanation;Demonstration;Verbal cues;Tactile cues    Comprehension  Verbalized understanding;Returned demonstration          PT Long Term Goals - 09/23/18 0909      PT LONG TERM GOAL #1   Title  Pt will demonstrate improved functional mobility by scoring at least 62 on FOTO    Baseline  PT FOTO score 75 (age norm 74) 09/22/2018  Time  4    Period  Weeks    Status  New    Target Date  10/20/18      PT LONG TERM GOAL #2   Title  Pt will demonstrate increased LE strength by improving B hip flexors, B hip abductors, and B dorsiflexors by 1/2 MMT grade    Baseline  4+/5 hip flexors 4/5 hip abductors 4+/5 dorsiflexors 09/22/2018    Time  4    Period  Weeks    Status  New    Target Date  10/20/18      PT LONG TERM GOAL #3   Title  Pt will perform 5xSTS in 12 seconds or less to demonstrate improved B LE power to improve ease with standing up from a car seat.    Baseline  5xsts: 17.81 sec with use of UE assist and multiple attempts to perform stand 09/22/2018    Time  4    Period  Weeks    Status  New    Target Date  10/20/18      PT LONG TERM GOAL #4    Title  Pt will score at least a 52/56 on the Berg in order to demonstrate decreased risk for falls    Baseline  Berg: 45/56 09/22/2018    Time  4    Period  Weeks    Status  New    Target Date  10/20/18            Plan - 09/26/18 1128    Clinical Impression Statement  Pt presents with increased rigidity with all stretching today, but particularly of B hamstrings and PFs. Pt also with slight L hip pain with supine trunk rotations to R that resolved once out of position. Pt required min assist to perform supine>sidelying>sit with difficulty initiating technique to use UE to push form side lying to sitting. Pt continues to show poor truncal dissociation necessary for rolling from supine to side lying. Pt also with difficulty initiating sit<>stands, and had to perform with intermittent min assist and UE assist to complete stand. Pt with improved sit<>stand technique when using external cues and verbal cues to exaggerate stand technique. Pt also required max verbal and tactile cues for seated lunges due to rigidity. Pt will continue to benefit from further skilled therapy to improve LE ROM, strength, and ease with initiating transfers to improve QOL.    Personal Factors and Comorbidities  Age;Comorbidity 1    Comorbidities  neurological disorder    Examination-Activity Limitations  Bed Mobility;Transfers    Examination-Participation Restrictions  Community Activity    Stability/Clinical Decision Making  Evolving/Moderate complexity    Clinical Decision Making  Moderate    Rehab Potential  Fair    PT Frequency  2x / week    PT Duration  4 weeks    PT Treatment/Interventions  ADLs/Self Care Home Management;Cryotherapy;Electrical Stimulation;Moist Heat;Traction;DME Instruction;Gait training;Stair training;Functional mobility training;Therapeutic activities;Therapeutic exercise;Balance training;Neuromuscular re-education;Patient/family education;Orthotic Fit/Training;Manual techniques;Passive range of  motion;Dry needling;Energy conservation;Joint Manipulations    PT Next Visit Plan  LE ROM exercises and static balance exercises; progress sit<>stands and bed mobility    Consulted and Agree with Plan of Care  Patient       Patient will benefit from skilled therapeutic intervention in order to improve the following deficits and impairments:  Abnormal gait, Improper body mechanics, Decreased coordination, Decreased mobility, Postural dysfunction, Decreased activity tolerance, Decreased endurance, Decreased range of motion, Decreased strength, Hypomobility, Decreased balance, Difficulty walking, Impaired flexibility,  Impaired tone  Visit Diagnosis: Muscle weakness (generalized)  Joint stiffness  Gait difficulty  Balance disorder     Problem List There are no active problems to display for this patient.  Pura Spice, PT, DPT # [id number] Ricard Dillon, SPT 09/27/2018, 3:12 PM  [place] [facility] [address] [place], [place], [address] Phone: [phone]   Fax:  [phone]  Name: DOUA CONDELLO MRN: [id number] Date of Birth: 1940/04/06

## 2018-09-30 ENCOUNTER — Encounter: Payer: Self-pay | Admitting: Physical Therapy

## 2018-09-30 ENCOUNTER — Other Ambulatory Visit: Payer: Self-pay

## 2018-09-30 ENCOUNTER — Ambulatory Visit: Payer: Medicare HMO | Admitting: Physical Therapy

## 2018-09-30 DIAGNOSIS — R2689 Other abnormalities of gait and mobility: Secondary | ICD-10-CM

## 2018-09-30 DIAGNOSIS — R269 Unspecified abnormalities of gait and mobility: Secondary | ICD-10-CM | POA: Diagnosis not present

## 2018-09-30 DIAGNOSIS — M2560 Stiffness of unspecified joint, not elsewhere classified: Secondary | ICD-10-CM | POA: Diagnosis not present

## 2018-09-30 DIAGNOSIS — M6281 Muscle weakness (generalized): Secondary | ICD-10-CM | POA: Diagnosis not present

## 2018-09-30 NOTE — Therapy (Signed)
[facility] [address] [facility] [address] [place], [place], [address] Phone: [phone]   Fax:  [phone]  Physical Therapy Treatment  Patient Details  Name: Daniel [name] MRN: [id number] Date of Birth: [birth year] Referring Provider (PT): [name], [place]   Encounter Date: 09/30/2018  PT End of Session - 09/30/18 1354    Visit Number  3    Number of Visits  8    Date for PT Re-Evaluation  10/20/18    Authorization - Visit Number  3    Authorization - Number of Visits  10    PT Start Time  [id number]    PT Stop Time  [id number]    PT Time Calculation (min)  52 min    Equipment Utilized During Treatment  Gait belt    Activity Tolerance  Patient tolerated treatment well    Behavior During Therapy  [facility] for tasks assessed/performed       Past Medical History:  Diagnosis Date  . AF (atrial fibrillation) ([place])   . B12 deficiency   . DH (dermatitis herpetiformis)   . Dysphagia   . Hypertension   . Prostate cancer ([place]) 2014    Past Surgical History:  Procedure Laterality Date  . APPENDECTOMY    . CARDIAC CATHETERIZATION    . COLONOSCOPY    . ESOPHAGOGASTRODUODENOSCOPY (EGD) WITH PROPOFOL N/A 10/13/2015   Procedure: ESOPHAGOGASTRODUODENOSCOPY (EGD) WITH PROPOFOL;  Surgeon: [name], MD;  Location: [facility] ENDOSCOPY;  Service: Endoscopy;  Laterality: N/A;  . PACEMAKER PLACEMENT      There were no vitals filed for this visit.  Subjective Assessment - 09/30/18 1353    Subjective  Pt reports 0/10. Pt states his legs "feel weak" today. Pt reports doing HEP.    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 [name] to get B12 shot.  Pt states he has had physical therapy in the past for L shoulder after a "minor surgery."    Limitations  Standing;House hold activities    Patient Stated Goals  Pt reports he would like to increase strength in LEs and UEs.    Currently in Pain?  No/denies        Neuro: Max cuing for technique with all exercises  Modified sit<>stand with emphasis on large amplitude movement - 2x10 Seated, large amplitude UE reaches: down, up and out - 1x8 with 10 second hold Seated large amplitude lunge B directions - 1x8 with 10 second hold Standing large amplitude step with UE abduction - 1x15 B LEs Standing large amplitude lunges with UE flexion/extension - 1x15 B LEs Seated large amplitude stepping over 6" step 20x B LEs Gait through clinic and to parking lot with emphasis on large amplitude movements, B arm swing - 4x Car transfer assessed after tx. session    PT Education - 09/30/18 1354    Education Details  Pt educated pt on gait mechanics/B arms swing    Person(s) Educated  Patient    Methods  Explanation;Demonstration;Verbal cues;Tactile cues    Comprehension  Verbalized understanding;Returned demonstration          PT Long Term Goals - 09/23/18 0909      PT LONG TERM GOAL #1   Title  Pt will demonstrate improved functional mobility by scoring at least 62 on FOTO    Baseline  PT FOTO score 76 (age norm 21) 09/22/2018    Time  4  Period  Weeks    Status  New    Target Date  10/20/18      PT LONG TERM GOAL #2   Title  Pt will demonstrate increased LE strength by improving B hip flexors, B hip abductors, and B dorsiflexors by 1/2 MMT grade    Baseline  4+/5 hip flexors 4/5 hip abductors 4+/5 dorsiflexors 09/22/2018    Time  4    Period  Weeks    Status  New    Target Date  10/20/18      PT LONG TERM GOAL #3   Title  Pt will perform 5xSTS in 12 seconds or less to demonstrate improved B LE power to improve ease with standing up from a car seat.    Baseline  5xsts: 17.81 sec with use of UE assist and multiple attempts to perform stand 09/22/2018    Time  4    Period  Weeks    Status  New    Target Date  10/20/18      PT LONG TERM GOAL #4   Title  Pt will score at least a 52/56 on the Berg in order to demonstrate decreased risk for  falls    Baseline  Berg: 45/56 09/22/2018    Time  4    Period  Weeks    Status  New    Target Date  10/20/18         Plan - 09/30/18 1355    Clinical Impression Statement  Pt demonstrates increased cadence with gait and decreased B armswing. Pt able to correct infrequently for armswing with max verbal and tactile cuing. Pt also demonstrates freezing with transfers but able to improve  with external and tactile cuing. Pt also with difficulty increase step length with forward and lateral stepping exercises, but showed minor improvement with external and tactile cuing. Pt will continue to benefit from further skilled therapy to improve ease with ADLs, gait mechanics, and B LE strength.    Personal Factors and Comorbidities  Age;Comorbidity 1    Comorbidities  neurological disorder    Examination-Activity Limitations  Bed Mobility;Transfers    Examination-Participation Restrictions  Community Activity    Stability/Clinical Decision Making  Evolving/Moderate complexity    Clinical Decision Making  Moderate    Rehab Potential  Fair    PT Frequency  2x / week    PT Duration  4 weeks    PT Treatment/Interventions  ADLs/Self Care Home Management;Cryotherapy;Electrical Stimulation;Moist Heat;Traction;DME Instruction;Gait training;Stair training;Functional mobility training;Therapeutic activities;Therapeutic exercise;Balance training;Neuromuscular re-education;Patient/family education;Orthotic Fit/Training;Manual techniques;Passive range of motion;Dry needling;Energy conservation;Joint Manipulations    PT Next Visit Plan  progress sit<>stand and functional exercises (getting out of a car, bed mobility)    Consulted and Agree with Plan of Care  Patient       Patient will benefit from skilled therapeutic intervention in order to improve the following deficits and impairments:  Abnormal gait, Improper body mechanics, Decreased coordination, Decreased mobility, Postural dysfunction, Decreased activity  tolerance, Decreased endurance, Decreased range of motion, Decreased strength, Hypomobility, Decreased balance, Difficulty walking, Impaired flexibility, Impaired tone  Visit Diagnosis: Muscle weakness (generalized)  Joint stiffness  Gait difficulty  Balance disorder     Problem List There are no active problems to display for this patient.  Pura Spice, PT, DPT # [id number] Ricard Dillon, SPT 09/30/2018, 2:05 PM  [place] [facility] [address] [place], [place], [address] Phone: [phone]   Fax:  [phone]  Name: Daniel [name] MRN: [id number] Date of Birth: [birth year]

## 2018-10-03 ENCOUNTER — Other Ambulatory Visit: Payer: Self-pay

## 2018-10-03 ENCOUNTER — Ambulatory Visit: Payer: Medicare HMO | Admitting: Physical Therapy

## 2018-10-03 ENCOUNTER — Encounter: Payer: Self-pay | Admitting: Physical Therapy

## 2018-10-03 DIAGNOSIS — R269 Unspecified abnormalities of gait and mobility: Secondary | ICD-10-CM

## 2018-10-03 DIAGNOSIS — R2689 Other abnormalities of gait and mobility: Secondary | ICD-10-CM | POA: Diagnosis not present

## 2018-10-03 DIAGNOSIS — M2560 Stiffness of unspecified joint, not elsewhere classified: Secondary | ICD-10-CM | POA: Diagnosis not present

## 2018-10-03 DIAGNOSIS — M6281 Muscle weakness (generalized): Secondary | ICD-10-CM

## 2018-10-03 NOTE — Therapy (Signed)
[facility] [address] [place], [place], [address] Phone: [phone]   Fax:  [phone]  Physical Therapy Treatment  Patient Details  Name: Daniel [name] MRN: [id number] Date of Birth: Dec 21, 1940 Referring Provider (PT): [name], [place]   Encounter Date: 10/03/2018  PT End of Session - 10/03/18 1228    Visit Number  4    Number of Visits  8    Date for PT Re-Evaluation  10/20/18    Authorization - Visit Number  4    Authorization - Number of Visits  10    PT Start Time  1022    PT Stop Time  1125    PT Time Calculation (min)  63 min    Equipment Utilized During Treatment  Gait belt    Activity Tolerance  Patient tolerated treatment well    Behavior During Therapy  [facility] for tasks assessed/performed       Past Medical History:  Diagnosis Date  . AF (atrial fibrillation) ([place])   . B12 deficiency   . DH (dermatitis herpetiformis)   . Dysphagia   . Hypertension   . Prostate cancer ([place]) 2014    Past Surgical History:  Procedure Laterality Date  . APPENDECTOMY    . CARDIAC CATHETERIZATION    . COLONOSCOPY    . ESOPHAGOGASTRODUODENOSCOPY (EGD) WITH PROPOFOL N/A 10/13/2015   Procedure: ESOPHAGOGASTRODUODENOSCOPY (EGD) WITH PROPOFOL;  Surgeon: [name], MD;  Location: [facility] ENDOSCOPY;  Service: Endoscopy;  Laterality: N/A;  . PACEMAKER PLACEMENT      There were no vitals filed for this visit.  Subjective Assessment - 10/03/18 1157    Subjective  Pt reports performing HEP at home; pt reports some balance difficulty with standing large amplitude forward lunge with UE abduction.  Pt reports 0/10 pain.    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 [name] to get B12 shot.  Pt states he has had physical therapy in the past for L shoulder after a "minor surgery."    Limitations  Standing;House hold activities    Patient Stated Goals  Pt reports he would like to  increase strength in LEs and UEs.    Currently in Pain?  No/denies         Neuro:   Max cuing for technique with all exercises Modified sit<>stand with emphasis on large amplitude movement and UE abduction in standing - 2x10 with 10 second hold Seated, large amplitude UE reaches: down, up and out - 2x8 with 10 second hold Seated large amplitude lunge B directions - 2x8 with 10 second hold Standing large amplitude step with UE abduction - 2x10 B LEs Scifit level 5 x14min Seated large amplitude forward lean with UE extension - 2x10 with 10 second hold Gait through clinic with emphasis on large amplitude movements, B arm swing - 4x Marching through clinic with emphasis on large amplitude movements, B arm swing - 2x  There.ex.:  Scifit L5 10 min. B UE/LE (requires assist for consistent cadence)   PT Education - 10/03/18 1159    Education Details  Pt educated on large amplitude movements with exercises.  Pt educated on gait mechanics/B arm swing.    Person(s) Educated  Patient    Methods  Demonstration;Explanation;Tactile cues;Verbal cues    Comprehension  Verbalized understanding;Returned demonstration;Verbal cues required;Tactile cues required          PT Long Term Goals - 09/23/18  0909      PT LONG TERM GOAL #1   Title  Pt will demonstrate improved functional mobility by scoring at least 62 on FOTO    Baseline  PT FOTO score 59 (age norm 29) 09/22/2018    Time  4    Period  Weeks    Status  New    Target Date  10/20/18      PT LONG TERM GOAL #2   Title  Pt will demonstrate increased LE strength by improving B hip flexors, B hip abductors, and B dorsiflexors by 1/2 MMT grade    Baseline  4+/5 hip flexors 4/5 hip abductors 4+/5 dorsiflexors 09/22/2018    Time  4    Period  Weeks    Status  New    Target Date  10/20/18      PT LONG TERM GOAL #3   Title  Pt will perform 5xSTS in 12 seconds or less to demonstrate improved B LE power to improve ease with standing up from a  car seat.    Baseline  5xsts: 17.81 sec with use of UE assist and multiple attempts to perform stand 09/22/2018    Time  4    Period  Weeks    Status  New    Target Date  10/20/18      PT LONG TERM GOAL #4   Title  Pt will score at least a 52/56 on the Berg in order to demonstrate decreased risk for falls    Baseline  Berg: 45/56 09/22/2018    Time  4    Period  Weeks    Status  New    Target Date  10/20/18           Plan - 10/03/18 1207    Clinical Impression Statement  Pt still demonstrates increased cadence with gait and decreased B arm swing; pt able to correct infrequently with max verbal and tactile cueing.  Pt requires max verbal and tactile cueing for technique with all exercises but showed minor improvement with large amplitude movements and increased stability on L>R during forward stepping with UE abduction.  Pt will continue to benefit from further skilled therapy to improve gait mechanics, B LE strength, B UE AROM, and ease with ADLs.    Personal Factors and Comorbidities  Age;Comorbidity 1    Comorbidities  neurological disorder    Examination-Activity Limitations  Bed Mobility;Transfers    Examination-Participation Restrictions  Community Activity    Stability/Clinical Decision Making  Evolving/Moderate complexity    Clinical Decision Making  Moderate    Rehab Potential  Fair    PT Frequency  2x / week    PT Duration  4 weeks    PT Treatment/Interventions  ADLs/Self Care Home Management;Cryotherapy;Electrical Stimulation;Moist Heat;Traction;DME Instruction;Gait training;Stair training;Functional mobility training;Therapeutic activities;Therapeutic exercise;Balance training;Neuromuscular re-education;Patient/family education;Orthotic Fit/Training;Manual techniques;Passive range of motion;Dry needling;Energy conservation;Joint Manipulations    PT Next Visit Plan  progress sit<>stand and functional exercises (getting out of a car, bed mobility)    Consulted and Agree with  Plan of Care  Patient       Patient will benefit from skilled therapeutic intervention in order to improve the following deficits and impairments:  Abnormal gait, Improper body mechanics, Decreased coordination, Decreased mobility, Postural dysfunction, Decreased activity tolerance, Decreased endurance, Decreased range of motion, Decreased strength, Hypomobility, Decreased balance, Difficulty walking, Impaired flexibility, Impaired tone  Visit Diagnosis: Muscle weakness (generalized)  Joint stiffness  Gait difficulty  Balance disorder  Problem List There are no active problems to display for this patient.  Pura Spice, PT, DPT # [id number] Chinita Greenland, SPT 10/03/2018, 1:00 PM  [facility] [address] [place], [place], [address] Phone: [phone]   Fax:  [phone]  Name: Daniel [name] MRN: [id number] Date of Birth: 31-Jan-1940

## 2018-10-06 ENCOUNTER — Encounter: Payer: Self-pay | Admitting: Physical Therapy

## 2018-10-06 ENCOUNTER — Ambulatory Visit: Payer: Medicare HMO | Admitting: Physical Therapy

## 2018-10-06 ENCOUNTER — Other Ambulatory Visit: Payer: Self-pay

## 2018-10-06 DIAGNOSIS — R2689 Other abnormalities of gait and mobility: Secondary | ICD-10-CM

## 2018-10-06 DIAGNOSIS — M2560 Stiffness of unspecified joint, not elsewhere classified: Secondary | ICD-10-CM | POA: Diagnosis not present

## 2018-10-06 DIAGNOSIS — M6281 Muscle weakness (generalized): Secondary | ICD-10-CM | POA: Diagnosis not present

## 2018-10-06 DIAGNOSIS — R269 Unspecified abnormalities of gait and mobility: Secondary | ICD-10-CM | POA: Diagnosis not present

## 2018-10-06 NOTE — Therapy (Signed)
[facility] [address] [place], [place], [address] Phone: [phone]   Fax:  [phone]  Physical Therapy Treatment  Patient Details  Name: Daniel [name] MRN: [id number] Date of Birth: 1940-06-26 Referring Provider (PT): [name], [place]   Encounter Date: 10/06/2018  PT End of Session - 10/06/18 1253    Visit Number  5    Number of Visits  8    Date for PT Re-Evaluation  10/20/18    Authorization - Visit Number  5    Authorization - Number of Visits  10    PT Start Time  0950    PT Stop Time  1052    PT Time Calculation (min)  62 min    Equipment Utilized During Treatment  Gait belt    Activity Tolerance  Patient tolerated treatment well    Behavior During Therapy  [facility] for tasks assessed/performed       Past Medical History:  Diagnosis Date  . AF (atrial fibrillation) ([place])   . B12 deficiency   . DH (dermatitis herpetiformis)   . Dysphagia   . Hypertension   . Prostate cancer ([place]) 2014    Past Surgical History:  Procedure Laterality Date  . APPENDECTOMY    . CARDIAC CATHETERIZATION    . COLONOSCOPY    . ESOPHAGOGASTRODUODENOSCOPY (EGD) WITH PROPOFOL N/A 10/13/2015   Procedure: ESOPHAGOGASTRODUODENOSCOPY (EGD) WITH PROPOFOL;  Surgeon: [name], MD;  Location: [facility] ENDOSCOPY;  Service: Endoscopy;  Laterality: N/A;  . PACEMAKER PLACEMENT      There were no vitals filed for this visit.  Subjective Assessment - 10/06/18 1247    Subjective  Pt reports performing HEP at home. Pt reports 0/10 pain.  Pt. planning on seeing Louie Casa after PT today for SLP screen.    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 [name] to get B12 shot.  Pt states he has had physical therapy in the past for L shoulder after a "minor surgery."    Limitations  Standing;House hold activities    Patient Stated Goals  Pt reports he would like to increase strength in LEs and UEs.    Currently in Pain?  No/denies        Neuro:   Max cuing for technique with all exercises Modified sit<>stand with emphasis on large amplitude movement and UE abduction in standing - 1x8 with 10 second hold Sit<>stand with emphasis on large amplitude movement and UE abduction in standing - 1x8 with 10 second hold Seated, large amplitude UE reaches: down, up and out - 2x8 with 10 second hold Seated large amplitude lunge B directions - 2x8 with 10 second hold Standing large amplitude step with UE abduction - 2x8 B LEs Standing large amplitude lateral step with UE abduction - 2x8 B LEs Seated large amplitude forward lean with UE extension - 2x8 with 10 second hold Forward reaching with cones - 2x8 B UE Lateral reaching with cones - 2x8 B UE Seated with hip flexion over 6" plinth to simulate car transfer - x20   There.ex.:  Scifit L4 x10 min. B UE/LE (requires assist for consistent cadence)     PT Education - 10/06/18 1249    Education Details  Pt educated on large amplitude movements with exercises.  Pt educated on forward/side lunge technique.    Person(s) Educated  Patient    Methods  Explanation;Demonstration;Tactile cues;Verbal cues  Comprehension  Verbalized understanding;Returned demonstration;Verbal cues required;Tactile cues required          PT Long Term Goals - 09/23/18 0909      PT LONG TERM GOAL #1   Title  Pt will demonstrate improved functional mobility by scoring at least 62 on FOTO    Baseline  PT FOTO score 37 (age norm 88) 09/22/2018    Time  4    Period  Weeks    Status  New    Target Date  10/20/18      PT LONG TERM GOAL #2   Title  Pt will demonstrate increased LE strength by improving B hip flexors, B hip abductors, and B dorsiflexors by 1/2 MMT grade    Baseline  4+/5 hip flexors 4/5 hip abductors 4+/5 dorsiflexors 09/22/2018    Time  4    Period  Weeks    Status  New    Target Date  10/20/18      PT LONG TERM GOAL #3   Title  Pt will perform  5xSTS in 12 seconds or less to demonstrate improved B LE power to improve ease with standing up from a car seat.    Baseline  5xsts: 17.81 sec with use of UE assist and multiple attempts to perform stand 09/22/2018    Time  4    Period  Weeks    Status  New    Target Date  10/20/18      PT LONG TERM GOAL #4   Title  Pt will score at least a 52/56 on the Berg in order to demonstrate decreased risk for falls    Baseline  Berg: 45/56 09/22/2018    Time  4    Period  Weeks    Status  New    Target Date  10/20/18            Plan - 10/06/18 1258    Clinical Impression Statement  Pt had difficulty initiating sit<>stand without modification (Airex pad).  Pt. unable to stand from standard chair without assist due to limited technique/ strength.   Pt required max verbal and tactile cues for technique with large amplitude movements. Pt demonstrated increased stability on L>R with stepping and reaching exercises.  Pt will continue to benefit from further skilled therapy to improve B LE strength, B UE ROM, and ease with ADLs.    Personal Factors and Comorbidities  Age;Comorbidity 1    Comorbidities  neurological disorder    Examination-Activity Limitations  Bed Mobility;Transfers    Examination-Participation Restrictions  Community Activity    Stability/Clinical Decision Making  Evolving/Moderate complexity    Clinical Decision Making  Moderate    Rehab Potential  Fair    PT Frequency  2x / week    PT Duration  4 weeks    PT Treatment/Interventions  ADLs/Self Care Home Management;Cryotherapy;Electrical Stimulation;Moist Heat;Traction;DME Instruction;Gait training;Stair training;Functional mobility training;Therapeutic activities;Therapeutic exercise;Balance training;Neuromuscular re-education;Patient/family education;Orthotic Fit/Training;Manual techniques;Passive range of motion;Dry needling;Energy conservation;Joint Manipulations    PT Next Visit Plan  Progress sit<>stand and functional  exercises (getting out of a car, bed mobility).  Discuss SLP screen.    Consulted and Agree with Plan of Care  Patient       Patient will benefit from skilled therapeutic intervention in order to improve the following deficits and impairments:  Abnormal gait, Improper body mechanics, Decreased coordination, Decreased mobility, Postural dysfunction, Decreased activity tolerance, Decreased endurance, Decreased range of motion, Decreased strength, Hypomobility, Decreased balance, Difficulty walking, Impaired  flexibility, Impaired tone  Visit Diagnosis: Muscle weakness (generalized)  Joint stiffness  Gait difficulty  Balance disorder     Problem List There are no active problems to display for this patient.  Pura Spice, PT, DPT # [id number] Chinita Greenland, SPT 10/06/2018, 3:13 PM  [facility] [address] [place], [place], [address] Phone: [phone]   Fax:  [phone]  Name: Daniel [name] MRN: [id number] Date of Birth: [birth year]

## 2018-10-10 ENCOUNTER — Ambulatory Visit: Payer: Medicare HMO | Admitting: Physical Therapy

## 2018-10-10 ENCOUNTER — Other Ambulatory Visit: Payer: Self-pay

## 2018-10-10 DIAGNOSIS — R2689 Other abnormalities of gait and mobility: Secondary | ICD-10-CM

## 2018-10-10 DIAGNOSIS — R269 Unspecified abnormalities of gait and mobility: Secondary | ICD-10-CM

## 2018-10-10 DIAGNOSIS — M2560 Stiffness of unspecified joint, not elsewhere classified: Secondary | ICD-10-CM

## 2018-10-10 DIAGNOSIS — M6281 Muscle weakness (generalized): Secondary | ICD-10-CM

## 2018-10-10 NOTE — Therapy (Addendum)
[facility] [address] [place], [place], [address] Phone: [phone]   Fax:  [phone]  Physical Therapy Treatment  Patient Details  Name: Daniel [name] MRN: [id number] Date of Birth: Feb 12, 1940 Referring Provider (PT): [name], [place]   Encounter Date: 10/10/2018  PT End of Session - 10/13/18 0738    Visit Number  6    Number of Visits  8    Date for PT Re-Evaluation  10/20/18    Authorization - Visit Number  6    Authorization - Number of Visits  10    PT Start Time  1106    PT Stop Time  1207    PT Time Calculation (min)  61 min    Equipment Utilized During Treatment  Gait belt    Activity Tolerance  Patient tolerated treatment well    Behavior During Therapy  [facility] for tasks assessed/performed       Past Medical History:  Diagnosis Date  . AF (atrial fibrillation) ([place])   . B12 deficiency   . DH (dermatitis herpetiformis)   . Dysphagia   . Hypertension   . Prostate cancer ([place]) 2014    Past Surgical History:  Procedure Laterality Date  . APPENDECTOMY    . CARDIAC CATHETERIZATION    . COLONOSCOPY    . ESOPHAGOGASTRODUODENOSCOPY (EGD) WITH PROPOFOL N/A 10/13/2015   Procedure: ESOPHAGOGASTRODUODENOSCOPY (EGD) WITH PROPOFOL;  Surgeon: [name], MD;  Location: [facility] ENDOSCOPY;  Service: Endoscopy;  Laterality: N/A;  . PACEMAKER PLACEMENT      There were no vitals filed for this visit.  Subjective Assessment - 10/13/18 0734    Subjective  Pt. entered PT with no new complaints.  Pts. wife discussed the possibility of taking Dyami to see another MD at [facility] to assist with a diagnoses.    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 [name] to get B12 shot.  Pt states he has had physical therapy in the past for L shoulder after a "minor surgery."    Limitations  Standing;House hold activities    Patient Stated Goals  Pt reports he would like to increase  strength in LEs and UEs.    Currently in Pain?  No/denies        Neuro:   Hallway walking: high marching/ alt. UE and LE touches/ head turning/ working on midline walking with improved upright posture and head position. Step ups/ overs in //-bars (6" plinth).   Turning CW/ CCW in //-bars with cuing to increase step pattern Functional reaching to cones L/R Modified sit<>stand with emphasis on large amplitude movement and UE abduction in standing - 1x8 with 10 second hold Sit<>stand with emphasis on large amplitude movement and UE abduction in standing - 1x8 with 10 second hold Seated L/R distal hamstring stretches with PT assist (3x30 sec. Each).   Lateral reaching with cones - 2x8 B UE Walking outside on sideways/ down curbs with cuing for increase BOS/ arm swing for balance (no LOB).    There.ex.:  Scifit L5.5 x10 min. B UE/LE (requires assist for consistent cadence)- benefits from foot straps. Standing hip abduction/ marching/ heel raises 20x.   L hamstring length/ knee extension and hip marching greater on L as compared to R.       PT Long Term Goals - 09/23/18 0909      PT LONG TERM GOAL #1   Title  Pt  will demonstrate improved functional mobility by scoring at least 62 on FOTO    Baseline  PT FOTO score 2 (age norm 55) 09/22/2018    Time  4    Period  Weeks    Status  New    Target Date  10/20/18      PT LONG TERM GOAL #2   Title  Pt will demonstrate increased LE strength by improving B hip flexors, B hip abductors, and B dorsiflexors by 1/2 MMT grade    Baseline  4+/5 hip flexors 4/5 hip abductors 4+/5 dorsiflexors 09/22/2018    Time  4    Period  Weeks    Status  New    Target Date  10/20/18      PT LONG TERM GOAL #3   Title  Pt will perform 5xSTS in 12 seconds or less to demonstrate improved B LE power to improve ease with standing up from a car seat.    Baseline  5xsts: 17.81 sec with use of UE assist and multiple attempts to perform stand 09/22/2018    Time   4    Period  Weeks    Status  New    Target Date  10/20/18      PT LONG TERM GOAL #4   Title  Pt will score at least a 52/56 on the Berg in order to demonstrate decreased risk for falls    Baseline  Berg: 45/56 09/22/2018    Time  4    Period  Weeks    Status  New    Target Date  10/20/18         Plan - 10/13/18 0739    Clinical Impression Statement  Pt. required numerous attempts to stand from green chair without use of UE assist.  Pt. benefits from a >2" lift on seat and verbal cuing to maintain a forward head position to stand without assist.  No LOB during tx. session but moderate cuing to slow down/ increase arm swing/ BOS.  B LE muscle weakness/ significant B hamstring tightness (R hamstring tigher than L).  Difficulty with walking head turns/ dynamic balance tasks and benefits from min. to moderate assistance.  No change to HEP.    Personal Factors and Comorbidities  Age;Comorbidity 1    Comorbidities  neurological disorder    Examination-Activity Limitations  Bed Mobility;Transfers    Examination-Participation Restrictions  Community Activity    Stability/Clinical Decision Making  Evolving/Moderate complexity    Rehab Potential  Fair    PT Frequency  2x / week    PT Duration  4 weeks    PT Treatment/Interventions  ADLs/Self Care Home Management;Cryotherapy;Electrical Stimulation;Moist Heat;Traction;DME Instruction;Gait training;Stair training;Functional mobility training;Therapeutic activities;Therapeutic exercise;Balance training;Neuromuscular re-education;Patient/family education;Orthotic Fit/Training;Manual techniques;Passive range of motion;Dry needling;Energy conservation;Joint Manipulations    PT Next Visit Plan  Progress sit<>stand and functional exercises (getting out of a car, bed mobility).  Discuss pts. thought about seeing a MD at [facility].    Consulted and Agree with Plan of Care  Patient       Patient will benefit from skilled therapeutic intervention in order to  improve the following deficits and impairments:  Abnormal gait, Improper body mechanics, Decreased coordination, Decreased mobility, Postural dysfunction, Decreased activity tolerance, Decreased endurance, Decreased range of motion, Decreased strength, Hypomobility, Decreased balance, Difficulty walking, Impaired flexibility, Impaired tone  Visit Diagnosis: Muscle weakness (generalized)  Joint stiffness  Gait difficulty  Balance disorder     Problem List There are no active problems to  display for this patient.  Pura Spice, PT, DPT # [phone] 10/13/2018, 7:50 AM  [place] [address] [place], [place], [address] Phone: [phone]   Fax:  [phone]  Name: Daniel [name] MRN: [id number] Date of Birth: 05/26/1940

## 2018-10-13 ENCOUNTER — Ambulatory Visit: Payer: Medicare HMO | Admitting: Physical Therapy

## 2018-10-13 ENCOUNTER — Encounter: Payer: Self-pay | Admitting: Physical Therapy

## 2018-10-13 ENCOUNTER — Other Ambulatory Visit: Payer: Self-pay

## 2018-10-13 DIAGNOSIS — R269 Unspecified abnormalities of gait and mobility: Secondary | ICD-10-CM | POA: Diagnosis not present

## 2018-10-13 DIAGNOSIS — M2560 Stiffness of unspecified joint, not elsewhere classified: Secondary | ICD-10-CM

## 2018-10-13 DIAGNOSIS — R2689 Other abnormalities of gait and mobility: Secondary | ICD-10-CM

## 2018-10-13 DIAGNOSIS — M6281 Muscle weakness (generalized): Secondary | ICD-10-CM | POA: Diagnosis not present

## 2018-10-13 NOTE — Therapy (Signed)
[facility] [address] [place], [place], [address] Phone: [phone]   Fax:  [phone]  Physical Therapy Treatment  Patient Details  Name: Daniel [name] MRN: [id number] Date of Birth: December 10, 1940 Referring Provider (PT): [name], [place]   Encounter Date: 10/13/2018  PT End of Session - 10/13/18 1254    Visit Number  7    Number of Visits  8    Date for PT Re-Evaluation  10/20/18    Authorization - Visit Number  7    Authorization - Number of Visits  10    PT Start Time  1027    PT Stop Time  1131    PT Time Calculation (min)  64 min    Equipment Utilized During Treatment  Gait belt    Activity Tolerance  Patient tolerated treatment well    Behavior During Therapy  [facility] for tasks assessed/performed       Past Medical History:  Diagnosis Date  . AF (atrial fibrillation) ([place])   . B12 deficiency   . DH (dermatitis herpetiformis)   . Dysphagia   . Hypertension   . Prostate cancer ([place]) 2014    Past Surgical History:  Procedure Laterality Date  . APPENDECTOMY    . CARDIAC CATHETERIZATION    . COLONOSCOPY    . ESOPHAGOGASTRODUODENOSCOPY (EGD) WITH PROPOFOL N/A 10/13/2015   Procedure: ESOPHAGOGASTRODUODENOSCOPY (EGD) WITH PROPOFOL;  Surgeon: [name], MD;  Location: [facility] ENDOSCOPY;  Service: Endoscopy;  Laterality: N/A;  . PACEMAKER PLACEMENT      There were no vitals filed for this visit.  Subjective Assessment - 10/13/18 1256    Subjective  Pt reports no pain today. Pt states he has performed some of his HEP "everyday." Pt reports increased difficulty with initiating sit<>stands yesterday.    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 [name] to get B12 shot.  Pt states he has had physical therapy in the past for L shoulder after a "minor surgery."    Limitations  Standing;House hold activities    Patient Stated Goals  Pt reports he would like to  increase strength in LEs and UEs.    Currently in Pain?  No/denies        Scifit L4 4 min. B UE/LE (difficulty maintaining feet on foot plates).    Neuro:    Modified sit<>stand with emphasis on large amplitude movement and UE abduction in standing, 1/2# weights on UEs - 2x8. Cuing for technique Sit<>stand with emphasis on large amplitude movement and UE abduction in standing, 1# weights on UE - 1x8 pt with increased freezing, required UE assist and min assist to complete set Seated, large amplitude UE reaches, 1# weights on UEs: down, up and out - 2x15 with 10 second hold. Cuing provided Seated large amplitude lunge B directions - 1x8 with 10 second hold B directions. Cuing provided. Standing large amplitude step with UE abduction, 1# weight on B UEs - 1x8 B LEs Standing large amplitude lateral step with UE abduction, 1# weight on B UEs - 1x8 B LEs Seated large amplitude forward lean with UE extension - 2x8 with 10 second hold Seated with hip flexion over 6" step to simulate car transfer, 3# ankle weights - 20x. Cuing provided. Gait in clinic, emphasis on large steps, B arm swing - 4x. Cuing provided Gait in clinic with dual task, reaching and trunk rotation- 2x each Cuing  provided     PT Education - 10/13/18 1255    Education Details  Pt educated on gait technique: increasing step-length and arm swing    Person(s) Educated  Patient    Methods  Explanation;Demonstration;Tactile cues;Verbal cues    Comprehension  Verbalized understanding;Returned demonstration          PT Long Term Goals - 09/23/18 0909      PT LONG TERM GOAL #1   Title  Pt will demonstrate improved functional mobility by scoring at least 62 on FOTO    Baseline  PT FOTO score 37 (age norm 60) 09/22/2018    Time  4    Period  Weeks    Status  New    Target Date  10/20/18      PT LONG TERM GOAL #2   Title  Pt will demonstrate increased LE strength by improving B hip flexors, B hip abductors, and B  dorsiflexors by 1/2 MMT grade    Baseline  4+/5 hip flexors 4/5 hip abductors 4+/5 dorsiflexors 09/22/2018    Time  4    Period  Weeks    Status  New    Target Date  10/20/18      PT LONG TERM GOAL #3   Title  Pt will perform 5xSTS in 12 seconds or less to demonstrate improved B LE power to improve ease with standing up from a car seat.    Baseline  5xsts: 17.81 sec with use of UE assist and multiple attempts to perform stand 09/22/2018    Time  4    Period  Weeks    Status  New    Target Date  10/20/18      PT LONG TERM GOAL #4   Title  Pt will score at least a 52/56 on the Berg in order to demonstrate decreased risk for falls    Baseline  Berg: 45/56 09/22/2018    Time  4    Period  Weeks    Status  New    Target Date  10/20/18            Plan - 10/13/18 1248    Clinical Impression Statement  Pt initially showed progress in today's session by performing majority of modified sit<>stands without requiring multiple attempts to initiate movement. Pt further progressed with performing initial set of regular sit<>stands from a standard chair height. However, as pt LEs fatigued with second set of regular sit<>stands, pt exhibited increased freezing episodes and was unable to complete second set without use of B UE assist and min assist from SPT. Pt also required max tactile and verbal cuing for all standing exercises. During standing, large amplitude lateral lunges pt exhibited decreased static balance with L LE as stance leg and required multiple small backward steps and min-assist to regain balance. Pt also continues to require max verbal and tactile cuing for B arm swing with gait and is inconsistently able to maintain arm swing (L UE requiring more tactile cuing than R). When pt asked to perform dual task with gait, pt step length decreased B. Pt will continue to benefit from further skilled therapy to improve LE strength and gait mechanics to in order to increase ease with ADLs.     Personal Factors and Comorbidities  Age;Comorbidity 1    Comorbidities  neurological disorder    Examination-Activity Limitations  Bed Mobility;Transfers    Examination-Participation Restrictions  Community Activity    Stability/Clinical Decision Making  Evolving/Moderate complexity  Clinical Decision Making  Moderate    Rehab Potential  Fair    PT Frequency  2x / week    PT Duration  4 weeks    PT Treatment/Interventions  ADLs/Self Care Home Management;Cryotherapy;Electrical Stimulation;Moist Heat;Traction;DME Instruction;Gait training;Stair training;Functional mobility training;Therapeutic activities;Therapeutic exercise;Balance training;Neuromuscular re-education;Patient/family education;Orthotic Fit/Training;Manual techniques;Passive range of motion;Dry needling;Energy conservation;Joint Manipulations    PT Next Visit Plan  Progress sit<>stand and functional exercises (getting out of a car, bed mobility).  Discuss pts. thought about seeing a MD at [facility]; bed moblity exercise/technique    Consulted and Agree with Plan of Care  Patient       Patient will benefit from skilled therapeutic intervention in order to improve the following deficits and impairments:  Abnormal gait, Improper body mechanics, Decreased coordination, Decreased mobility, Postural dysfunction, Decreased activity tolerance, Decreased endurance, Decreased range of motion, Decreased strength, Hypomobility, Decreased balance, Difficulty walking, Impaired flexibility, Impaired tone  Visit Diagnosis: Muscle weakness (generalized)  Joint stiffness  Gait difficulty  Balance disorder     Problem List There are no active problems to display for this patient.  Pura Spice, PT, DPT # [id number] Ricard Dillon, SPT 10/13/2018, 12:57 PM  [facility] [address] [place], [place], [address] Phone: [phone]   Fax:  [phone]  Name: Daniel [name] MRN: [id number] Date of  Birth: 1940-05-07

## 2018-10-15 ENCOUNTER — Ambulatory Visit: Payer: Medicare HMO | Admitting: Physical Therapy

## 2018-10-15 ENCOUNTER — Other Ambulatory Visit: Payer: Self-pay

## 2018-10-15 ENCOUNTER — Encounter: Payer: Self-pay | Admitting: Physical Therapy

## 2018-10-15 DIAGNOSIS — R269 Unspecified abnormalities of gait and mobility: Secondary | ICD-10-CM | POA: Diagnosis not present

## 2018-10-15 DIAGNOSIS — R2689 Other abnormalities of gait and mobility: Secondary | ICD-10-CM | POA: Diagnosis not present

## 2018-10-15 DIAGNOSIS — M2560 Stiffness of unspecified joint, not elsewhere classified: Secondary | ICD-10-CM

## 2018-10-15 DIAGNOSIS — M6281 Muscle weakness (generalized): Secondary | ICD-10-CM | POA: Diagnosis not present

## 2018-10-15 NOTE — Therapy (Signed)
[facility] [address] [place], [place], [address] Phone: [phone]   Fax:  [phone]  Physical Therapy Treatment  Patient Details  Name: Daniel [name] MRN: [id number] Date of Birth: August 06, 1940 Referring Provider (PT): [name], [place]   Encounter Date: 10/15/2018  PT End of Session - 10/15/18 1610    Visit Number  8    Number of Visits  8    Date for PT Re-Evaluation  10/20/18    Authorization - Visit Number  8    Authorization - Number of Visits  10    PT Start Time  [id number]    PT Stop Time  1558    PT Time Calculation (min)  59 min    Equipment Utilized During Treatment  Gait belt    Activity Tolerance  Patient tolerated treatment well    Behavior During Therapy  [facility] for tasks assessed/performed       Past Medical History:  Diagnosis Date  . AF (atrial fibrillation) ([place])   . B12 deficiency   . DH (dermatitis herpetiformis)   . Dysphagia   . Hypertension   . Prostate cancer ([place]) 2014    Past Surgical History:  Procedure Laterality Date  . APPENDECTOMY    . CARDIAC CATHETERIZATION    . COLONOSCOPY    . ESOPHAGOGASTRODUODENOSCOPY (EGD) WITH PROPOFOL N/A 10/13/2015   Procedure: ESOPHAGOGASTRODUODENOSCOPY (EGD) WITH PROPOFOL;  Surgeon: [name], MD;  Location: [facility] ENDOSCOPY;  Service: Endoscopy;  Laterality: N/A;  . PACEMAKER PLACEMENT      There were no vitals filed for this visit.  Subjective Assessment - 10/15/18 1456    Subjective  Pt states he "feels stiff" today. Pt reports no pain. Pt reports he slipped yesterday wearing socks on hardwood floor and hit L elbow but "caught himself." Pt state "I had a hard time standing yesterday." Pt reports wife has been helping him get out of bed. Pt reports he has follow up with Dr. Kary Kos Monday.    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 [name] to get B12 shot.  Pt states he has had physical  therapy in the past for L shoulder after a "minor surgery."    Limitations  Standing;House hold activities    Patient Stated Goals  Pt reports he would like to increase strength in LEs and UEs.    Currently in Pain?  No/denies        Therapeutic Exercise: Nustep, L2, 10 min      Neuro:   Modified sit<>stand with emphasis on large amplitude movement and UE abduction in standing 1# weights on UEs - 2x8. Cuing for technique Seated, large amplitude UE reaches, 1# weights on UEs: down, up and out - 2x15 with 10 second hold. Cuing provided Standing large amplitude step with UE abduction, 1# weight on B UEs -1x20 B LEs Standing large amplitude posterior step with UE extension, 1# weight on B UEs -1x20 B LEs Gait in clinic, emphasis on large steps, B arm swing - 6x. Cuing provided Bed mobility with min-mod assist, supine->L sidelying->sitting: 2x Max cuing for technique      PT Long Term Goals - 09/23/18 0909      PT LONG TERM GOAL #1   Title  Pt will demonstrate improved functional mobility by scoring at least 62 on FOTO    Baseline  PT FOTO score 39 (age norm 56) 09/22/2018  Time  4    Period  Weeks    Status  New    Target Date  10/20/18      PT LONG TERM GOAL #2   Title  Pt will demonstrate increased LE strength by improving B hip flexors, B hip abductors, and B dorsiflexors by 1/2 MMT grade    Baseline  4+/5 hip flexors 4/5 hip abductors 4+/5 dorsiflexors 09/22/2018    Time  4    Period  Weeks    Status  New    Target Date  10/20/18      PT LONG TERM GOAL #3   Title  Pt will perform 5xSTS in 12 seconds or less to demonstrate improved B LE power to improve ease with standing up from a car seat.    Baseline  5xsts: 17.81 sec with use of UE assist and multiple attempts to perform stand 09/22/2018    Time  4    Period  Weeks    Status  New    Target Date  10/20/18      PT LONG TERM GOAL #4   Title  Pt will score at least a 52/56 on the Berg in order to demonstrate  decreased risk for falls    Baseline  Berg: 45/56 09/22/2018    Time  4    Period  Weeks    Status  New    Target Date  10/20/18            Plan - 10/15/18 1605    Clinical Impression Statement  Pt shifts weight primarily onto heels with modified sit<>stands, requiring min assist occasionally to prevent posterior LOB. Pt still requires max tactile cuing for technique with all exercises. However, pt exhibited fewer freezing episodes with sit<>stands compared to previous session. Pt requires max cuing for arm swing with gait and exhibits decreased coordination with arm swing (will swing L UE but not R UE). Pt had significant difficulty with bed mobility technique for side lying to sitting, demonstrating rigidity throughout trunk and B LES and was unable to generate momentum for rolling with UEs. PT will benefit from further skilled therapy to improve gait mechanics and functional mobility.    Personal Factors and Comorbidities  Age;Comorbidity 1    Comorbidities  neurological disorder    Examination-Activity Limitations  Bed Mobility;Transfers    Examination-Participation Restrictions  Community Activity    Stability/Clinical Decision Making  Evolving/Moderate complexity    Clinical Decision Making  Moderate    Rehab Potential  Fair    PT Frequency  2x / week    PT Duration  4 weeks    PT Treatment/Interventions  ADLs/Self Care Home Management;Cryotherapy;Electrical Stimulation;Moist Heat;Traction;DME Instruction;Gait training;Stair training;Functional mobility training;Therapeutic activities;Therapeutic exercise;Balance training;Neuromuscular re-education;Patient/family education;Orthotic Fit/Training;Manual techniques;Passive range of motion;Dry needling;Energy conservation;Joint Manipulations    PT Next Visit Plan  Bed mobility (teach wife bed mobidility supine->side lying -> sitting technique)    Consulted and Agree with Plan of Care  Patient       Patient will benefit from skilled  therapeutic intervention in order to improve the following deficits and impairments:  Abnormal gait, Improper body mechanics, Decreased coordination, Decreased mobility, Postural dysfunction, Decreased activity tolerance, Decreased endurance, Decreased range of motion, Decreased strength, Hypomobility, Decreased balance, Difficulty walking, Impaired flexibility, Impaired tone  Visit Diagnosis: Muscle weakness (generalized)  Joint stiffness  Gait difficulty  Balance disorder     Problem List There are no active problems to display for this patient.  Rebeca Alert  Clementeen Hoof, PT, DPT # [id number], SPT 10/16/2018, 7:30 AM  [facility] [address] [place], [place], [address] Phone: [phone]   Fax:  [phone]  Name: Daniel [name] MRN: [id number] Date of Birth: 11/18/1940

## 2018-10-20 ENCOUNTER — Ambulatory Visit: Payer: Medicare HMO | Admitting: Physical Therapy

## 2018-10-20 ENCOUNTER — Encounter: Payer: Self-pay | Admitting: Physical Therapy

## 2018-10-20 ENCOUNTER — Other Ambulatory Visit: Payer: Self-pay

## 2018-10-20 DIAGNOSIS — R2689 Other abnormalities of gait and mobility: Secondary | ICD-10-CM | POA: Diagnosis not present

## 2018-10-20 DIAGNOSIS — R636 Underweight: Secondary | ICD-10-CM | POA: Diagnosis not present

## 2018-10-20 DIAGNOSIS — R269 Unspecified abnormalities of gait and mobility: Secondary | ICD-10-CM | POA: Diagnosis not present

## 2018-10-20 DIAGNOSIS — M6281 Muscle weakness (generalized): Secondary | ICD-10-CM

## 2018-10-20 DIAGNOSIS — M2560 Stiffness of unspecified joint, not elsewhere classified: Secondary | ICD-10-CM

## 2018-10-20 DIAGNOSIS — Z8546 Personal history of malignant neoplasm of prostate: Secondary | ICD-10-CM | POA: Diagnosis not present

## 2018-10-20 DIAGNOSIS — Z681 Body mass index (BMI) 19 or less, adult: Secondary | ICD-10-CM | POA: Diagnosis not present

## 2018-10-20 DIAGNOSIS — R29898 Other symptoms and signs involving the musculoskeletal system: Secondary | ICD-10-CM | POA: Diagnosis not present

## 2018-10-20 DIAGNOSIS — R2681 Unsteadiness on feet: Secondary | ICD-10-CM | POA: Diagnosis not present

## 2018-10-20 DIAGNOSIS — R499 Unspecified voice and resonance disorder: Secondary | ICD-10-CM | POA: Diagnosis not present

## 2018-10-20 DIAGNOSIS — R1310 Dysphagia, unspecified: Secondary | ICD-10-CM | POA: Diagnosis not present

## 2018-10-20 NOTE — Therapy (Signed)
[facility] [address] [place], [place], [address] Phone: [phone]   Fax:  [phone]  Physical Therapy Treatment  Patient Details  Name: Daniel [name] MRN: [id number] Date of Birth: Aug 13, 1940 Referring Provider (PT): [name], [place]   Encounter Date: 10/20/2018  PT End of Session - 10/20/18 1244    Visit Number  9    Number of Visits  17    Date for PT Re-Evaluation  11/17/18    Authorization - Visit Number  1    Authorization - Number of Visits  10    PT Start Time  7619    PT Stop Time  1116    PT Time Calculation (min)  47 min    Equipment Utilized During Treatment  Gait belt    Activity Tolerance  Patient tolerated treatment well    Behavior During Therapy  [facility] for tasks assessed/performed       Past Medical History:  Diagnosis Date  . AF (atrial fibrillation) ([place])   . B12 deficiency   . DH (dermatitis herpetiformis)   . Dysphagia   . Hypertension   . Prostate cancer ([place]) 2014    Past Surgical History:  Procedure Laterality Date  . APPENDECTOMY    . CARDIAC CATHETERIZATION    . COLONOSCOPY    . ESOPHAGOGASTRODUODENOSCOPY (EGD) WITH PROPOFOL N/A 10/13/2015   Procedure: ESOPHAGOGASTRODUODENOSCOPY (EGD) WITH PROPOFOL;  Surgeon: [name], MD;  Location: [facility] ENDOSCOPY;  Service: Endoscopy;  Laterality: N/A;  . PACEMAKER PLACEMENT      There were no vitals filed for this visit.  Subjective Assessment - 10/20/18 1242    Subjective  Pt reports 0/10 pain today.  Pt is going to the doctor later today with hopes to get a referral for a second opinion dx.    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 [name] to get B12 shot.  Pt states he has had physical therapy in the past for L shoulder after a "minor surgery."    Limitations  Standing;House hold activities    Patient Stated Goals  Pt reports he would like to increase strength in LEs and UEs.     Currently in Pain?  No/denies       Therapeutic Exercise: Marching in // bars forward/ backward with large amplitude steps - 4# ankle weights, x4 Lateral stepping in // bars - 4# ankle weights, x4 Seated marching - 4# ankle weights, 2x20 Seated LAQ - 4# ankle weights, 2x20 Nustep L3 x10 min  Neuro: Modified sit<>stand with emphasis on large amplitude movement and UE abduction in standing - 1x10 with 10 second hold Seated, large amplitude UE reaches: down, up and out - 1# weights on B UE, 1x10 with 10 second hold Seated large amplitude lunge B directions - 1# weights on B UE, 1x10 B with 10 second hold Gait training with emphasis on large amplitude arm swing - x6 laps    PT Long Term Goals - 10/20/18 1722      PT LONG TERM GOAL #1   Title  Pt will demonstrate improved functional mobility by scoring at least 62 on FOTO    Baseline  PT FOTO score 37 (age norm 50) 09/22/2018    Time  4    Period  Weeks    Status  On-going    Target Date  11/17/18      PT LONG TERM GOAL #  2   Title  Pt will demonstrate increased LE strength by improving B hip flexors, B hip abductors, and B dorsiflexors by 1/2 MMT grade    Baseline  4+/5 hip flexors 4/5 hip abductors 4+/5 dorsiflexors    Time  4    Period  Weeks    Status  Not Met    Target Date  11/17/18      PT LONG TERM GOAL #3   Title  Pt will perform 5xSTS in 12 seconds or less to demonstrate improved B LE power to improve ease with standing up from a car seat.    Baseline  5xsts: 17.81 sec with use of UE assist and multiple attempts to perform stand 09/22/2018    Time  4    Period  Weeks    Status  On-going    Target Date  11/17/18      PT LONG TERM GOAL #4   Title  Pt will score at least a 52/56 on the Berg in order to demonstrate decreased risk for falls    Baseline  Berg: 45/56 09/22/2018    Time  4    Period  Weeks    Status  On-going    Target Date  11/17/18            Plan - 10/20/18 1246    Clinical Impression  Statement  Pt reports/ demonstrates increased weakness in R>L LE and increased stiffness/ rigidity on L>R UE.  Pt able to perform modified sit<>stand with fewer freezing episodes and cueing for large amplitude forward reach.  Pt requires frequent verbal cueing for large amplitude UE movements during large amplitude exercises and arm swing during gait.  Pt will benefit from further skilled therapy to increase B LE strength, improve gait mechanics, and increase functional mobility.  See updated goals.    Personal Factors and Comorbidities  Age;Comorbidity 1    Comorbidities  neurological disorder    Examination-Activity Limitations  Bed Mobility;Transfers    Examination-Participation Restrictions  Community Activity    Stability/Clinical Decision Making  Evolving/Moderate complexity    Clinical Decision Making  Moderate    Rehab Potential  Fair    PT Frequency  2x / week    PT Duration  4 weeks    PT Treatment/Interventions  ADLs/Self Care Home Management;Cryotherapy;Electrical Stimulation;Moist Heat;Traction;DME Instruction;Gait training;Stair training;Functional mobility training;Therapeutic activities;Therapeutic exercise;Balance training;Neuromuscular re-education;Patient/family education;Orthotic Fit/Training;Manual techniques;Passive range of motion;Dry needling;Energy conservation;Joint Manipulations    PT Next Visit Plan  Bed mobility (teach wife bed mobility & large amp movement cuing), continue LE strength/ balance, review HEP    Consulted and Agree with Plan of Care  Patient       Patient will benefit from skilled therapeutic intervention in order to improve the following deficits and impairments:  Abnormal gait, Improper body mechanics, Decreased coordination, Decreased mobility, Postural dysfunction, Decreased activity tolerance, Decreased endurance, Decreased range of motion, Decreased strength, Hypomobility, Decreased balance, Difficulty walking, Impaired flexibility, Impaired  tone  Visit Diagnosis: Muscle weakness (generalized)  Joint stiffness  Gait difficulty  Balance disorder     Problem List There are no active problems to display for this patient.  Pura Spice, PT, DPT # [id number] Chinita [place], SPT 10/20/2018, 5:24 PM  [facility] [address] [place], [place], [address] Phone: [phone]   Fax:  [phone]  Name: Daniel [name] MRN: [id number] Date of Birth: [birth year]

## 2018-10-22 ENCOUNTER — Ambulatory Visit: Payer: Medicare HMO | Admitting: Physical Therapy

## 2018-10-22 ENCOUNTER — Other Ambulatory Visit: Payer: Self-pay

## 2018-10-22 ENCOUNTER — Encounter: Payer: Self-pay | Admitting: Physical Therapy

## 2018-10-22 DIAGNOSIS — M6281 Muscle weakness (generalized): Secondary | ICD-10-CM | POA: Diagnosis not present

## 2018-10-22 DIAGNOSIS — M2560 Stiffness of unspecified joint, not elsewhere classified: Secondary | ICD-10-CM

## 2018-10-22 DIAGNOSIS — R269 Unspecified abnormalities of gait and mobility: Secondary | ICD-10-CM

## 2018-10-22 DIAGNOSIS — R2689 Other abnormalities of gait and mobility: Secondary | ICD-10-CM

## 2018-10-22 NOTE — Therapy (Signed)
[facility] [address] [place], [place], [address] Phone: [phone]   Fax:  [phone]  Physical Therapy Treatment  Patient Details  Name: Daniel [name] MRN: [id number] Date of Birth: February 23, 1940 Referring Provider (PT): [name], [place]   Encounter Date: 10/22/2018  PT End of Session - 10/22/18 1513    Visit Number  10    Number of Visits  17    Date for PT Re-Evaluation  11/17/18    Authorization - Visit Number  2    Authorization - Number of Visits  10    PT Start Time  1502    PT Stop Time  1601    PT Time Calculation (min)  59 min    Equipment Utilized During Treatment  Gait belt    Activity Tolerance  Patient tolerated treatment well    Behavior During Therapy  [facility] for tasks assessed/performed       Past Medical History:  Diagnosis Date  . AF (atrial fibrillation) ([place])   . B12 deficiency   . DH (dermatitis herpetiformis)   . Dysphagia   . Hypertension   . Prostate cancer ([place]) 2014    Past Surgical History:  Procedure Laterality Date  . APPENDECTOMY    . CARDIAC CATHETERIZATION    . COLONOSCOPY    . ESOPHAGOGASTRODUODENOSCOPY (EGD) WITH PROPOFOL N/A 10/13/2015   Procedure: ESOPHAGOGASTRODUODENOSCOPY (EGD) WITH PROPOFOL;  Surgeon: [name], MD;  Location: [facility] ENDOSCOPY;  Service: Endoscopy;  Laterality: N/A;  . PACEMAKER PLACEMENT      There were no vitals filed for this visit.  Subjective Assessment - 10/22/18 1512    Subjective  Pt. states his R hip is sore today.  Pt. reports he may have slept on the R hip wrong.  No c/o pain at this time.  Pt. has been referred to Dr. Manuella Ghazi for evaluation in upcoming weeks.    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 [name] to get B12 shot.  Pt states he has had physical therapy in the past for L shoulder after a "minor surgery."    Limitations  Standing;House hold activities    Patient Stated Goals   Pt reports he would like to increase strength in LEs and UEs.    Currently in Pain?  No/denies          Therapeutic Exercise:  Nustep L4 x10 min. B UE/LE (using foot straps).  Seated marching - 4# ankle weights, 2x20 Seated LAQ - 4# ankle weights, 2x20 Marching in // bars forward/ backward with large amplitude steps - 4# ankle weights, x4 Lateral stepping in // bars - 4# ankle weights, x4 Stair tapping - 4# ankle weights Lateral step over 3.5" hurdle - 4# ankle weights, 2x10 B Step over 3.5" hurdle - 4# ankle weights 2x10 B   Neuro:  Modified sit<>stand with emphasis on large amplitude movement and UE abduction in standing -1x10 with 10 second hold Seated, large amplitude UE reaches: down, up and out - 1x10 with 10 second hold Gait training with emphasis on large amplitude arm swing - x6 laps Gait training outdoors on uneven surfaces, curbs, and picking up objects - x5 laps    PT Long Term Goals - 10/20/18 1722      PT LONG TERM GOAL #1   Title  Pt will demonstrate improved functional mobility by scoring at least 62 on FOTO  Baseline  PT FOTO score 56 (age norm 47) 09/22/2018    Time  4    Period  Weeks    Status  On-going    Target Date  11/17/18      PT LONG TERM GOAL #2   Title  Pt will demonstrate increased LE strength by improving B hip flexors, B hip abductors, and B dorsiflexors by 1/2 MMT grade    Baseline  4+/5 hip flexors 4/5 hip abductors 4+/5 dorsiflexors    Time  4    Period  Weeks    Status  Not Met    Target Date  11/17/18      PT LONG TERM GOAL #3   Title  Pt will perform 5xSTS in 12 seconds or less to demonstrate improved B LE power to improve ease with standing up from a car seat.    Baseline  5xsts: 17.81 sec with use of UE assist and multiple attempts to perform stand 09/22/2018    Time  4    Period  Weeks    Status  On-going    Target Date  11/17/18      PT LONG TERM GOAL #4   Title  Pt will score at least a 52/56 on the Berg in order  to demonstrate decreased risk for falls    Baseline  Berg: 45/56 09/22/2018    Time  4    Period  Weeks    Status  On-going    Target Date  11/17/18            Plan - 10/22/18 1513    Clinical Impression Statement  Pt reports increased stiffness today with large amplitude movements.  Pt requires tactile cueing to increase hip flexion with marching exercises.  Pt also requires tactile cueing to increase step length with lateral stepping in // bars and lateral step overs.  Pt reports R leg feels weaker than L.  Pt able to ambulate in clinic and outdoors with consistent heel strike.  Pt able to reach down to pick up objects in grass slowly but safely with CGA.  Pt will benefit from further skilled therapy to increase LE strength, reduce risk of falls, and increase ROM for ease with ADLs.    Personal Factors and Comorbidities  Age;Comorbidity 1    Comorbidities  neurological disorder    Examination-Activity Limitations  Bed Mobility;Transfers    Examination-Participation Restrictions  Community Activity    Stability/Clinical Decision Making  Evolving/Moderate complexity    Clinical Decision Making  Moderate    Rehab Potential  Fair    PT Frequency  2x / week    PT Duration  4 weeks    PT Treatment/Interventions  ADLs/Self Care Home Management;Cryotherapy;Electrical Stimulation;Moist Heat;Traction;DME Instruction;Gait training;Stair training;Functional mobility training;Therapeutic activities;Therapeutic exercise;Balance training;Neuromuscular re-education;Patient/family education;Orthotic Fit/Training;Manual techniques;Passive range of motion;Dry needling;Energy conservation;Joint Manipulations    PT Next Visit Plan  Bed mobility (teach wife bed mobility & large amp movement cuing), continue LE strength/ balance    Consulted and Agree with Plan of Care  Patient       Patient will benefit from skilled therapeutic intervention in order to improve the following deficits and impairments:   Abnormal gait, Improper body mechanics, Decreased coordination, Decreased mobility, Postural dysfunction, Decreased activity tolerance, Decreased endurance, Decreased range of motion, Decreased strength, Hypomobility, Decreased balance, Difficulty walking, Impaired flexibility, Impaired tone  Visit Diagnosis: Joint stiffness  Muscle weakness (generalized)  Gait difficulty  Balance disorder     Problem List There  are no active problems to display for this patient.  Pura Spice, PT, DPT # [phone] 10/22/2018, 5:24 PM  [facility] [address] [place], [place], [address] Phone: [phone]   Fax:  [phone]  Name: ANIL HAVARD MRN: [id number] Date of Birth: 1940/04/04

## 2018-10-27 ENCOUNTER — Other Ambulatory Visit: Payer: Self-pay

## 2018-10-27 ENCOUNTER — Ambulatory Visit: Payer: Medicare HMO | Attending: Student | Admitting: Physical Therapy

## 2018-10-27 ENCOUNTER — Encounter: Payer: Self-pay | Admitting: Physical Therapy

## 2018-10-27 DIAGNOSIS — R2689 Other abnormalities of gait and mobility: Secondary | ICD-10-CM

## 2018-10-27 DIAGNOSIS — R269 Unspecified abnormalities of gait and mobility: Secondary | ICD-10-CM

## 2018-10-27 DIAGNOSIS — M2560 Stiffness of unspecified joint, not elsewhere classified: Secondary | ICD-10-CM | POA: Diagnosis not present

## 2018-10-27 DIAGNOSIS — M6281 Muscle weakness (generalized): Secondary | ICD-10-CM | POA: Diagnosis not present

## 2018-10-27 NOTE — Therapy (Signed)
[facility] [address] [place], [place], [address] Phone: [phone]   Fax:  [phone]  Physical Therapy Treatment  Patient Details  Name: Daniel [name] MRN: [id number] Date of Birth: 05/24/1940 Referring Provider (PT): [name], [place]   Encounter Date: 10/27/2018  PT End of Session - 10/27/18 1120    Visit Number  11    Number of Visits  17    Date for PT Re-Evaluation  11/17/18    Authorization - Visit Number  3    Authorization - Number of Visits  10    PT Start Time  0959    PT Stop Time  1109    PT Time Calculation (min)  70 min    Equipment Utilized During Treatment  Gait belt    Activity Tolerance  Patient tolerated treatment well    Behavior During Therapy  [facility] for tasks assessed/performed       Past Medical History:  Diagnosis Date  . AF (atrial fibrillation) ([place])   . B12 deficiency   . DH (dermatitis herpetiformis)   . Dysphagia   . Hypertension   . Prostate cancer ([place]) 2014    Past Surgical History:  Procedure Laterality Date  . APPENDECTOMY    . CARDIAC CATHETERIZATION    . COLONOSCOPY    . ESOPHAGOGASTRODUODENOSCOPY (EGD) WITH PROPOFOL N/A 10/13/2015   Procedure: ESOPHAGOGASTRODUODENOSCOPY (EGD) WITH PROPOFOL;  Surgeon: [name], MD;  Location: [facility] ENDOSCOPY;  Service: Endoscopy;  Laterality: N/A;  . PACEMAKER PLACEMENT      There were no vitals filed for this visit.  Subjective Assessment - 10/27/18 1118    Subjective  Pt reports 0/10 pain today.  Pt reports difficulty with sit<>stand in recliner at home due to inability to scoot forward in chair.  Pt reports performing his HEP daily.    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 [name] to get B12 shot.  Pt states he has had physical therapy in the past for L shoulder after a "minor surgery."    Limitations  Standing;House hold activities    Patient Stated Goals  Pt reports  he would like to increase strength in LEs and UEs.    Currently in Pain?  No/denies       Neuro: Gait in gym - x6 laps SLB tapping in star - x5 B (increased postural sway and stepping strategy for posterior tapping) Reaching for cones forward/lateral - 3x10 each direction R lateral reaching for cones on foam - 1x10 ceased due to excessive supination of R ankle Sit<>stand at bariatric table with various seat heights - 3x8 (cueing for B UE forward reaching) Forward lunge with large amplitude movement - x10 B (cueing for large steps and bending the front knee to promote weight shift to toes)  Therapeutic Activity: Bed mobility: scooting, rolling, side-lying to sitting, sitting to supine (cueing for pushing through R heel to bring R hip off table and using R arm momentum to roll onto side)  Therapeutic Exercise: B heel raises in // bars - 2x10 (decreased ROM) Nustep L4 x10 min Reviewed HEP    PT Education - 10/27/18 1119    Education Details  Pt and spouse educated on bed mobility    Person(s) Educated  Patient;Spouse    Methods  Explanation;Demonstration;Tactile cues;Verbal cues    Comprehension  Verbalized understanding;Returned demonstration;Verbal cues required;Need further instruction  PT Long Term Goals - 10/20/18 1722      PT LONG TERM GOAL #1   Title  Pt will demonstrate improved functional mobility by scoring at least 62 on FOTO    Baseline  PT FOTO score 33 (age norm 61) 09/22/2018    Time  4    Period  Weeks    Status  On-going    Target Date  11/17/18      PT LONG TERM GOAL #2   Title  Pt will demonstrate increased LE strength by improving B hip flexors, B hip abductors, and B dorsiflexors by 1/2 MMT grade    Baseline  4+/5 hip flexors 4/5 hip abductors 4+/5 dorsiflexors    Time  4    Period  Weeks    Status  Not Met    Target Date  11/17/18      PT LONG TERM GOAL #3   Title  Pt will perform 5xSTS in 12 seconds or less to demonstrate improved B LE  power to improve ease with standing up from a car seat.    Baseline  5xsts: 17.81 sec with use of UE assist and multiple attempts to perform stand 09/22/2018    Time  4    Period  Weeks    Status  On-going    Target Date  11/17/18      PT LONG TERM GOAL #4   Title  Pt will score at least a 52/56 on the Berg in order to demonstrate decreased risk for falls    Baseline  Berg: 45/56 09/22/2018    Time  4    Period  Weeks    Status  On-going    Target Date  11/17/18         Plan - 10/27/18 1123    Clinical Impression Statement  Pt requires frequent verbal cueing to maintain armswing with gait through clinic.  Pt has decreased SLB with posterior tapping in star with increased postural sway and stepping strategies for LOB.  Pt unable to perform lateral reaching for cones on foam due to excessive supination of R ankle; demonstrated sufficient ankle control with lateral reaching on stable surface with intermittent UE support for balance.  Pt has difficulty maintaining balance with large amplitude forward lunges; requires consistent cueing for large steps and knee flexion of the anterior LE.  Pt requires min assist for rolling and side-lying to sitting bed mobility with verbal and tactile cueing for technique.  Pt and spouse educated on bed mobility at home to promote safety and independence.  Pt will benefit from further skilled therapy to increase LE strength, reduce risk of falls, and increase independence with ADLs.    Personal Factors and Comorbidities  Age;Comorbidity 1    Comorbidities  neurological disorder    Examination-Activity Limitations  Bed Mobility;Transfers    Examination-Participation Restrictions  Community Activity    Stability/Clinical Decision Making  Evolving/Moderate complexity    Clinical Decision Making  Moderate    Rehab Potential  Fair    PT Frequency  2x / week    PT Duration  4 weeks    PT Treatment/Interventions  ADLs/Self Care Home Management;Cryotherapy;Electrical  Stimulation;Moist Heat;Traction;DME Instruction;Gait training;Stair training;Functional mobility training;Therapeutic activities;Therapeutic exercise;Balance training;Neuromuscular re-education;Patient/family education;Orthotic Fit/Training;Manual techniques;Passive range of motion;Dry needling;Energy conservation;Joint Manipulations    PT Next Visit Plan  Bed mobility, continue LE strength/ balance, sit<>stand, car transfers    Consulted and Agree with Plan of Care  Patient  Patient will benefit from skilled therapeutic intervention in order to improve the following deficits and impairments:  Abnormal gait, Improper body mechanics, Decreased coordination, Decreased mobility, Postural dysfunction, Decreased activity tolerance, Decreased endurance, Decreased range of motion, Decreased strength, Hypomobility, Decreased balance, Difficulty walking, Impaired flexibility, Impaired tone  Visit Diagnosis: Muscle weakness (generalized)  Joint stiffness  Gait difficulty  Balance disorder     Problem List There are no active problems to display for this patient.  Pura Spice, PT, DPT # [id number] Chinita Greenland, SPT 10/27/2018, 11:51 AM  [facility] [address] [place], [place], [address] Phone: [phone]   Fax:  [phone]  Name: Daniel [name] MRN: [id number] Date of Birth: [birth year]

## 2018-10-29 ENCOUNTER — Ambulatory Visit: Payer: Medicare HMO | Admitting: Physical Therapy

## 2018-10-29 ENCOUNTER — Other Ambulatory Visit: Payer: Self-pay

## 2018-10-29 ENCOUNTER — Encounter: Payer: Self-pay | Admitting: Physical Therapy

## 2018-10-29 DIAGNOSIS — M2560 Stiffness of unspecified joint, not elsewhere classified: Secondary | ICD-10-CM | POA: Diagnosis not present

## 2018-10-29 DIAGNOSIS — M6281 Muscle weakness (generalized): Secondary | ICD-10-CM | POA: Diagnosis not present

## 2018-10-29 DIAGNOSIS — R269 Unspecified abnormalities of gait and mobility: Secondary | ICD-10-CM

## 2018-10-29 DIAGNOSIS — R2689 Other abnormalities of gait and mobility: Secondary | ICD-10-CM | POA: Diagnosis not present

## 2018-10-29 NOTE — Therapy (Signed)
[place] [facility] [address] [place], [place], [address] Phone: [phone]   Fax:  [phone]  Physical Therapy Treatment  Patient Details  Name: Daniel [name] MRN: [id number] Date of Birth: February 08, 1940 Referring Provider (PT): [name], [place]   Encounter Date: 10/29/2018  PT End of Session - 10/29/18 1035    Visit Number  12    Number of Visits  17    Date for PT Re-Evaluation  11/17/18    Authorization - Visit Number  4    Authorization - Number of Visits  10    PT Start Time  [phone]    PT Stop Time  1037    PT Time Calculation (min)  55 min    Equipment Utilized During Treatment  Gait belt    Activity Tolerance  Patient tolerated treatment well    Behavior During Therapy  [facility] for tasks assessed/performed       Past Medical History:  Diagnosis Date  . AF (atrial fibrillation) ([place])   . B12 deficiency   . DH (dermatitis herpetiformis)   . Dysphagia   . Hypertension   . Prostate cancer ([place]) 2014    Past Surgical History:  Procedure Laterality Date  . APPENDECTOMY    . CARDIAC CATHETERIZATION    . COLONOSCOPY    . ESOPHAGOGASTRODUODENOSCOPY (EGD) WITH PROPOFOL N/A 10/13/2015   Procedure: ESOPHAGOGASTRODUODENOSCOPY (EGD) WITH PROPOFOL;  Surgeon: [name], MD;  Location: [facility] ENDOSCOPY;  Service: Endoscopy;  Laterality: N/A;  . PACEMAKER PLACEMENT      There were no vitals filed for this visit.  Subjective Assessment - 10/29/18 1031    Subjective  Pt reports 0/10 pain.  Pt reports some soreness and stiffness in L hip after last visit, 0/10 pain currently.  Pt reports performing HEP daily.    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 [name] to get B12 shot.  Pt states he has had physical therapy in the past for L shoulder after a "minor surgery."    Limitations  Standing;House hold activities    Patient Stated Goals  Pt reports he would like to increase  strength in LEs and UEs.    Currently in Pain?  No/denies       Therapeutic Exercise: Seated marching 5# ankle weights - 2x20 Seated LAQ 5# ankle weights - 2x20 Lateral stepping in // bars 5# ankle weights - x4 Nustep L4 x10 min   Neuro: Ladder walking: forward/ lateral every other square Stepping over 3.5" hurdles and 6" plinth - x4 Lateral stepping over 3.5" hurdles and 6" plinth - x4 difficulty stepping far enough to leave room for following LE Forward reach for cones - 2x10 Forward reach for cones on foam - 2x10 with intermittent unilateral UE support Modified sit<>stand (standard chair with foam) with large amplitude forward reach and abduction stretch in standing - 1x10 with 10 second hold    PT Education - 10/29/18 1034    Education Details  Pt educated on arm swing gait mechanics    Person(s) Educated  Patient    Methods  Explanation;Demonstration;Tactile cues;Verbal cues    Comprehension  Verbalized understanding;Returned demonstration;Verbal cues required;Tactile cues required          PT Long Term Goals - 10/20/18 1722      PT LONG TERM GOAL #1   Title  Pt will demonstrate improved functional mobility by scoring at least 62  on FOTO    Baseline  PT FOTO score 20 (age norm 45) 09/22/2018    Time  4    Period  Weeks    Status  On-going    Target Date  11/17/18      PT LONG TERM GOAL #2   Title  Pt will demonstrate increased LE strength by improving B hip flexors, B hip abductors, and B dorsiflexors by 1/2 MMT grade    Baseline  4+/5 hip flexors 4/5 hip abductors 4+/5 dorsiflexors    Time  4    Period  Weeks    Status  Not Met    Target Date  11/17/18      PT LONG TERM GOAL #3   Title  Pt will perform 5xSTS in 12 seconds or less to demonstrate improved B LE power to improve ease with standing up from a car seat.    Baseline  5xsts: 17.81 sec with use of UE assist and multiple attempts to perform stand 09/22/2018    Time  4    Period  Weeks    Status   On-going    Target Date  11/17/18      PT LONG TERM GOAL #4   Title  Pt will score at least a 52/56 on the Berg in order to demonstrate decreased risk for falls    Baseline  Berg: 45/56 09/22/2018    Time  4    Period  Weeks    Status  On-going    Target Date  11/17/18            Plan - 10/29/18 1036    Clinical Impression Statement  Pt requires frequent verbal cueing for armswing during gait.  Pt able to maintain midline gait in hallway with coordinated marching with alternateing UE knee tap.  Pt demonstrated ipsilateral gait deviations with head turning during gait in hallway; pt states feeling "unsteady."  Pt requires foam pad for sit<>stands with verbal cueing weight shift from heels to toes and large amplitude forward reach.  Pt has difficulty with large amplitude lateral stepping; difficulty with stepping laterally over objects, unable to step far enough to leave room for the following LE.  Pt will benefit from further skilled therapy to increase LE strength, increase UE/ LE ROM for ease with ADLs, and improve balance/ gait technique.    Personal Factors and Comorbidities  Age;Comorbidity 1    Comorbidities  neurological disorder    Examination-Activity Limitations  Bed Mobility;Transfers    Examination-Participation Restrictions  Community Activity    Stability/Clinical Decision Making  Evolving/Moderate complexity    Clinical Decision Making  Moderate    Rehab Potential  Fair    PT Frequency  2x / week    PT Duration  4 weeks    PT Treatment/Interventions  ADLs/Self Care Home Management;Cryotherapy;Electrical Stimulation;Moist Heat;Traction;DME Instruction;Gait training;Stair training;Functional mobility training;Therapeutic activities;Therapeutic exercise;Balance training;Neuromuscular re-education;Patient/family education;Orthotic Fit/Training;Manual techniques;Passive range of motion;Dry needling;Energy conservation;Joint Manipulations    PT Next Visit Plan  Bed mobility,  continue LE strength/ balance, sit<>stand, car transfers    Consulted and Agree with Plan of Care  Patient       Patient will benefit from skilled therapeutic intervention in order to improve the following deficits and impairments:  Abnormal gait, Improper body mechanics, Decreased coordination, Decreased mobility, Postural dysfunction, Decreased activity tolerance, Decreased endurance, Decreased range of motion, Decreased strength, Hypomobility, Decreased balance, Difficulty walking, Impaired flexibility, Impaired tone  Visit Diagnosis: Muscle weakness (generalized)  Joint stiffness  Gait difficulty  Balance disorder     Problem List There are no active problems to display for this patient.  Pura Spice, PT, DPT # [id number] Chinita Greenland, SPT 10/29/2018, 1:23 PM  [facility] [address] [place], [place], [address] Phone: [phone]   Fax:  [phone]  Name: Daniel [name] MRN: [id number] Date of Birth: August 09, 1940

## 2018-11-03 ENCOUNTER — Encounter: Payer: Self-pay | Admitting: Physical Therapy

## 2018-11-03 ENCOUNTER — Ambulatory Visit: Payer: Medicare HMO | Admitting: Physical Therapy

## 2018-11-03 ENCOUNTER — Other Ambulatory Visit: Payer: Self-pay

## 2018-11-03 DIAGNOSIS — M2560 Stiffness of unspecified joint, not elsewhere classified: Secondary | ICD-10-CM | POA: Diagnosis not present

## 2018-11-03 DIAGNOSIS — R2689 Other abnormalities of gait and mobility: Secondary | ICD-10-CM | POA: Diagnosis not present

## 2018-11-03 DIAGNOSIS — R269 Unspecified abnormalities of gait and mobility: Secondary | ICD-10-CM | POA: Diagnosis not present

## 2018-11-03 DIAGNOSIS — M6281 Muscle weakness (generalized): Secondary | ICD-10-CM

## 2018-11-03 NOTE — Therapy (Signed)
[facility] [address] [place], [place], [address] Phone: [phone]   Fax:  [phone]  Physical Therapy Treatment  Patient Details  Name: Daniel [name] MRN: [id number] Date of Birth: 1940-08-07 Referring Provider (PT): [name], [place]   Encounter Date: 11/03/2018  PT End of Session - 11/03/18 1539    Visit Number  13    Number of Visits  17    Date for PT Re-Evaluation  11/17/18    Authorization - Visit Number  5    Authorization - Number of Visits  10    PT Start Time  0944    PT Stop Time  1046    PT Time Calculation (min)  62 min    Equipment Utilized During Treatment  Gait belt    Activity Tolerance  Patient tolerated treatment well    Behavior During Therapy  [facility] for tasks assessed/performed       Past Medical History:  Diagnosis Date  . AF (atrial fibrillation) ([place])   . B12 deficiency   . DH (dermatitis herpetiformis)   . Dysphagia   . Hypertension   . Prostate cancer ([place]) 2014    Past Surgical History:  Procedure Laterality Date  . APPENDECTOMY    . CARDIAC CATHETERIZATION    . COLONOSCOPY    . ESOPHAGOGASTRODUODENOSCOPY (EGD) WITH PROPOFOL N/A 10/13/2015   Procedure: ESOPHAGOGASTRODUODENOSCOPY (EGD) WITH PROPOFOL;  Surgeon: [name], MD;  Location: [facility] ENDOSCOPY;  Service: Endoscopy;  Laterality: N/A;  . PACEMAKER PLACEMENT      There were no vitals filed for this visit.  Subjective Assessment - 11/03/18 1538    Subjective  Pt reports no pain today. Pt reports having some difficulty at home getting out of recliner; states that he got a "good workout" yesterday from attemping to do so.  Pt reports performing HEP daily.    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 [name] to get B12 shot.  Pt states he has had physical therapy in the past for L shoulder after a "minor surgery."    Limitations  Standing;House hold activities    Patient Stated Goals  Pt reports he would like to increase strength in LEs and UEs.    Currently in Pain?  No/denies        Therapeutic Exercise: Nustep L5 x10 min Supine bridge - x10 with 5 sec hold  Neuro: Modified sit<>stand (standard chair with foam) with large amplitude forward reach and abduction stretch in standing - 2x8 with 10 second hold Sit<>stand on bariatric table with 4 pillows to mimic home recliner - 2x8 with cueing for forward lean/ nose over toes and use of B UE to push off cushioned seat surface Gait around clinic - x5 with cuing for armswing   There. Act.: Scooting - x10 with cueing for foot placement before bridge/ hip movement Rolling, side-lying to sitting, sitting to supine - x3 with cueing for pushing through R heel to bring R hip off table and using R arm momentum to roll onto side  Pt reports fear of falling; educated on bed mobility and sit<>stand safety.     PT Education - 11/03/18 1538    Education Details  Pt educated on sit to stand Programmer, systems) Educated  Patient    Methods  Explanation;Demonstration;Tactile cues;Verbal cues    Comprehension  Verbalized understanding;Returned demonstration;Need further instruction  PT Long Term Goals - 10/20/18 1722      PT LONG TERM GOAL #1   Title  Pt will demonstrate improved functional mobility by scoring at least 62 on FOTO    Baseline  PT FOTO score 35 (age norm 86) 09/22/2018    Time  4    Period  Weeks    Status  On-going    Target Date  11/17/18      PT LONG TERM GOAL #2   Title  Pt will demonstrate increased LE strength by improving B hip flexors, B hip abductors, and B dorsiflexors by 1/2 MMT grade    Baseline  4+/5 hip flexors 4/5 hip abductors 4+/5 dorsiflexors    Time  4    Period  Weeks    Status  Not Met    Target Date  11/17/18      PT LONG TERM GOAL #3   Title  Pt will perform 5xSTS in 12 seconds or less to demonstrate improved B LE power to improve ease with  standing up from a car seat.    Baseline  5xsts: 17.81 sec with use of UE assist and multiple attempts to perform stand 09/22/2018    Time  4    Period  Weeks    Status  On-going    Target Date  11/17/18      PT LONG TERM GOAL #4   Title  Pt will score at least a 52/56 on the Berg in order to demonstrate decreased risk for falls    Baseline  Berg: 45/56 09/22/2018    Time  4    Period  Weeks    Status  On-going    Target Date  11/17/18            Plan - 11/03/18 1540    Clinical Impression Statement  Pt demonstrates difficulty with sit<>stand due to freezing and insufficient forward weight shift 2/2 fear of falling.  Pt further educated on sit<>stand mechanics to increase use of momentum.  Pt demonstrates adequate mechanics with use of B UE and forward leaning to successfully perform sit<>stand from cushioned, low height seat surface (to mimic home recliner).  Pt demonstrates independence with bed mobility with verbal cueing.  Pt will benefit from futher skilled therapy to increase independence with ADLs and improve LE strength/ balance.    Personal Factors and Comorbidities  Age;Comorbidity 1    Comorbidities  neurological disorder    Examination-Activity Limitations  Bed Mobility;Transfers    Examination-Participation Restrictions  Community Activity    Stability/Clinical Decision Making  Evolving/Moderate complexity    Clinical Decision Making  Moderate    Rehab Potential  Fair    PT Frequency  2x / week    PT Duration  4 weeks    PT Treatment/Interventions  ADLs/Self Care Home Management;Cryotherapy;Electrical Stimulation;Moist Heat;Traction;DME Instruction;Gait training;Stair training;Functional mobility training;Therapeutic activities;Therapeutic exercise;Balance training;Neuromuscular re-education;Patient/family education;Orthotic Fit/Training;Manual techniques;Passive range of motion;Dry needling;Energy conservation;Joint Manipulations    PT Next Visit Plan  Bed mobility  (without verbal cueing), continue LE strength/ balance, sit<>stand (recliner), car transfers    Consulted and Agree with Plan of Care  Patient       Patient will benefit from skilled therapeutic intervention in order to improve the following deficits and impairments:  Abnormal gait, Improper body mechanics, Decreased coordination, Decreased mobility, Postural dysfunction, Decreased activity tolerance, Decreased endurance, Decreased range of motion, Decreased strength, Hypomobility, Decreased balance, Difficulty walking, Impaired flexibility, Impaired tone  Visit Diagnosis: Muscle weakness (generalized)  Joint stiffness  Gait difficulty  Balance disorder     Problem List There are no active problems to display for this patient.  Pura Spice, PT, DPT # [id number] Chinita Greenland, SPT 11/03/2018, 3:55 PM  [facility] [address] [place], [place], [address] Phone: [phone]   Fax:  [phone]  Name: Daniel [name] MRN: [id number] Date of Birth: [birth year]

## 2018-11-05 ENCOUNTER — Encounter: Payer: Self-pay | Admitting: Physical Therapy

## 2018-11-05 ENCOUNTER — Ambulatory Visit: Payer: Medicare HMO | Admitting: Physical Therapy

## 2018-11-05 ENCOUNTER — Other Ambulatory Visit: Payer: Self-pay

## 2018-11-05 DIAGNOSIS — M6281 Muscle weakness (generalized): Secondary | ICD-10-CM | POA: Diagnosis not present

## 2018-11-05 DIAGNOSIS — M2560 Stiffness of unspecified joint, not elsewhere classified: Secondary | ICD-10-CM | POA: Diagnosis not present

## 2018-11-05 DIAGNOSIS — R269 Unspecified abnormalities of gait and mobility: Secondary | ICD-10-CM

## 2018-11-05 DIAGNOSIS — R2689 Other abnormalities of gait and mobility: Secondary | ICD-10-CM

## 2018-11-05 NOTE — Therapy (Signed)
[facility] [address] [place], [place], [address] Phone: [phone]   Fax:  [phone]  Physical Therapy Treatment  Patient Details  Name: Daniel [name] MRN: [id number] Date of Birth: [birth year] Referring Provider (PT): [name], [place]   Encounter Date: 11/05/2018  PT End of Session - 11/05/18 1504    Visit Number  14    Number of Visits  17    Date for PT Re-Evaluation  11/17/18    Authorization - Visit Number  6    Authorization - Number of Visits  10    PT Start Time  1502    PT Stop Time  1550    PT Time Calculation (min)  48 min    Equipment Utilized During Treatment  Gait belt    Activity Tolerance  Patient tolerated treatment well    Behavior During Therapy  [facility] for tasks assessed/performed       Past Medical History:  Diagnosis Date  . AF (atrial fibrillation) ([place])   . B12 deficiency   . DH (dermatitis herpetiformis)   . Dysphagia   . Hypertension   . Prostate cancer ([place]) 2014    Past Surgical History:  Procedure Laterality Date  . APPENDECTOMY    . CARDIAC CATHETERIZATION    . COLONOSCOPY    . ESOPHAGOGASTRODUODENOSCOPY (EGD) WITH PROPOFOL N/A 10/13/2015   Procedure: ESOPHAGOGASTRODUODENOSCOPY (EGD) WITH PROPOFOL;  Surgeon: [name], MD;  Location: [facility] ENDOSCOPY;  Service: Endoscopy;  Laterality: N/A;  . PACEMAKER PLACEMENT      There were no vitals filed for this visit.  Subjective Assessment - 11/05/18 1503    Subjective  Pt reports no pain today.  Pt reports bed mobility and transfers from recliner to stand at home have been easier.  Pt reports doing HEP every day.    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 [name] to get B12 shot.  Pt states he has had physical therapy in the past for L shoulder after a "minor surgery."    Limitations  Standing;House hold activities    Patient  Stated Goals  Pt reports he would like to increase strength in LEs and UEs.    Currently in Pain?  No/denies       Therapeutic Exercise: Nustep L5 x10 Sit<>stand with various seat heights with UE abduction in standing (emphasis on large amplitude movements) - 2x8 high/ standard height each with 10 sec abd holds; pt with difficulty with forward reach/ weight shift for appropriate momentum with standard seat height  Neuro: Gait in hallway - x4 with cueing for arm swing Marching in hallway with alternate UE/ LE touches - x4 Bed mobility: sitting<>sidelying, rolling, scooting - x10 each; pt independent with bed mobility with technique education and verbal cueing    PT Long Term Goals - 10/20/18 1722      PT LONG TERM GOAL #1   Title  Pt will demonstrate improved functional mobility by scoring at least 62 on FOTO    Baseline  PT FOTO score 4 (age norm 8) 09/22/2018    Time  4    Period  Weeks    Status  On-going    Target Date  11/17/18      PT LONG TERM GOAL #2   Title  Pt will demonstrate increased LE strength by improving B hip flexors,  B hip abductors, and B dorsiflexors by 1/2 MMT grade    Baseline  4+/5 hip flexors 4/5 hip abductors 4+/5 dorsiflexors    Time  4    Period  Weeks    Status  Not Met    Target Date  11/17/18      PT LONG TERM GOAL #3   Title  Pt will perform 5xSTS in 12 seconds or less to demonstrate improved B LE power to improve ease with standing up from a car seat.    Baseline  5xsts: 17.81 sec with use of UE assist and multiple attempts to perform stand 09/22/2018    Time  4    Period  Weeks    Status  On-going    Target Date  11/17/18      PT LONG TERM GOAL #4   Title  Pt will score at least a 52/56 on the Berg in order to demonstrate decreased risk for falls    Baseline  Berg: 45/56 09/22/2018    Time  4    Period  Weeks    Status  On-going    Target Date  11/17/18            Plan - 11/05/18 1617    Clinical Impression Statement  Pt  demonstrates difficulty with sit<>stand at standard chair height 2/2 insufficient forward weight shift/ momentum; pt also at disadvantage due to leg length.  Pt requires frequent cueing for forward reach and weight shift to increase momentum with sit<>stands at all seat heights.  Pt requires mod assist for bed mobility with attempts to go directly from sitting EOB to supine; with education, pt able to independently perform bed mobility with verbal cueing using sit > side-lying > rolling > supine technique.  Pt also demonstrates independence with similar technique to get out of bed.  Pt will benefit from further skilled therapy to increase independence with bed mobility/ ADLs and improve LE strength/ balance.    Personal Factors and Comorbidities  Age;Comorbidity 1    Comorbidities  neurological disorder    Examination-Activity Limitations  Bed Mobility;Transfers    Examination-Participation Restrictions  Community Activity    Stability/Clinical Decision Making  Evolving/Moderate complexity    Clinical Decision Making  Moderate    Rehab Potential  Fair    PT Frequency  2x / week    PT Duration  4 weeks    PT Treatment/Interventions  ADLs/Self Care Home Management;Cryotherapy;Electrical Stimulation;Moist Heat;Traction;DME Instruction;Gait training;Stair training;Functional mobility training;Therapeutic activities;Therapeutic exercise;Balance training;Neuromuscular re-education;Patient/family education;Orthotic Fit/Training;Manual techniques;Passive range of motion;Dry needling;Energy conservation;Joint Manipulations    PT Next Visit Plan  Bed mobility, sit<>stand (various seat heights; recliner), LE strength/ balance    Consulted and Agree with Plan of Care  Patient       Patient will benefit from skilled therapeutic intervention in order to improve the following deficits and impairments:  Abnormal gait, Improper body mechanics, Decreased coordination, Decreased mobility, Postural dysfunction,  Decreased activity tolerance, Decreased endurance, Decreased range of motion, Decreased strength, Hypomobility, Decreased balance, Difficulty walking, Impaired flexibility, Impaired tone  Visit Diagnosis: Muscle weakness (generalized)  Joint stiffness  Gait difficulty  Balance disorder     Problem List There are no active problems to display for this patient.  Pura Spice, PT, DPT # [id number] Chinita Greenland, SPT 11/06/2018, 6:06 PM  [facility] [address] [place], [place], [address] Phone: [phone]   Fax:  [phone]  Name: KYRIE FLUDD MRN: [id number]  Date of Birth: [birth year]

## 2018-11-10 ENCOUNTER — Encounter: Payer: Self-pay | Admitting: Physical Therapy

## 2018-11-10 ENCOUNTER — Ambulatory Visit: Payer: Medicare HMO | Admitting: Physical Therapy

## 2018-11-10 ENCOUNTER — Other Ambulatory Visit: Payer: Self-pay

## 2018-11-10 DIAGNOSIS — R269 Unspecified abnormalities of gait and mobility: Secondary | ICD-10-CM

## 2018-11-10 DIAGNOSIS — R2689 Other abnormalities of gait and mobility: Secondary | ICD-10-CM | POA: Diagnosis not present

## 2018-11-10 DIAGNOSIS — M2560 Stiffness of unspecified joint, not elsewhere classified: Secondary | ICD-10-CM | POA: Diagnosis not present

## 2018-11-10 DIAGNOSIS — M6281 Muscle weakness (generalized): Secondary | ICD-10-CM

## 2018-11-10 NOTE — Therapy (Signed)
[facility] [address] [place], [place], [address] Phone: [phone]   Fax:  [phone]  Physical Therapy Treatment  Patient Details  Name: Daniel [name] MRN: [id number] Date of Birth: 23-Feb-1940 Referring Provider (PT): [name], [place]   Encounter Date: 11/10/2018  PT End of Session - 11/10/18 1237    Visit Number  15    Number of Visits  17    Date for PT Re-Evaluation  11/17/18    Authorization - Visit Number  7    Authorization - Number of Visits  10    PT Start Time  [phone]    PT Stop Time  1040    PT Time Calculation (min)  58 min    Equipment Utilized During Treatment  Gait belt    Activity Tolerance  Patient tolerated treatment well    Behavior During Therapy  [facility] for tasks assessed/performed       Past Medical History:  Diagnosis Date  . AF (atrial fibrillation) ([place])   . B12 deficiency   . DH (dermatitis herpetiformis)   . Dysphagia   . Hypertension   . Prostate cancer ([place]) 2014    Past Surgical History:  Procedure Laterality Date  . APPENDECTOMY    . CARDIAC CATHETERIZATION    . COLONOSCOPY    . ESOPHAGOGASTRODUODENOSCOPY (EGD) WITH PROPOFOL N/A 10/13/2015   Procedure: ESOPHAGOGASTRODUODENOSCOPY (EGD) WITH PROPOFOL;  Surgeon: [name], MD;  Location: [facility] ENDOSCOPY;  Service: Endoscopy;  Laterality: N/A;  . PACEMAKER PLACEMENT      There were no vitals filed for this visit.  Subjective Assessment - 11/10/18 1234    Subjective  Pt reports no pain today.  Pt states bed mobility and sit<>stand transfers from recliner at home have improved; pt says he was able to perform bed mobility transfers 3 times independently.  Pt reports performing HEP daily.    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 [name] to get B12 shot.  Pt states he has had physical therapy in the past for L shoulder after a  "minor surgery."    Limitations  Standing;House hold activities    Patient Stated Goals  Pt reports he would like to increase strength in LEs and UEs.    Currently in Pain?  No/denies        Therapeutic Exercise: Nustep L5 x10  Neuro: Modified sit<>stand with emphasis on large amplitude movement and UE abduction in standing -2x10 with 10 second hold Seated, large amplitude UE reaches: down, up and out - 1# weights on B UE, 1x10 with 10 second hold Seated large amplitude lunge B directions - 1# weights on B UE, 1x10 B with 10 second hold Gait training with emphasis on large amplitude arm swing - x6 laps Marching in hallway with large amplitude arm swing - x4    PT Education - 11/10/18 1237    Education Details  Pt educated on weight transfer with sit<>stand    Person(s) Educated  Patient    Methods  Explanation;Demonstration;Tactile cues;Verbal cues    Comprehension  Verbalized understanding;Returned demonstration;Need further instruction          PT Long Term Goals - 10/20/18 1722      PT LONG TERM GOAL #1   Title  Pt will demonstrate improved functional mobility by scoring at least 62 on FOTO  Baseline  PT FOTO score 64 (age norm 69) 09/22/2018    Time  4    Period  Weeks    Status  On-going    Target Date  11/17/18      PT LONG TERM GOAL #2   Title  Pt will demonstrate increased LE strength by improving B hip flexors, B hip abductors, and B dorsiflexors by 1/2 MMT grade    Baseline  4+/5 hip flexors 4/5 hip abductors 4+/5 dorsiflexors    Time  4    Period  Weeks    Status  Not Met    Target Date  11/17/18      PT LONG TERM GOAL #3   Title  Pt will perform 5xSTS in 12 seconds or less to demonstrate improved B LE power to improve ease with standing up from a car seat.    Baseline  5xsts: 17.81 sec with use of UE assist and multiple attempts to perform stand 09/22/2018    Time  4    Period  Weeks    Status  On-going    Target Date  11/17/18      PT LONG TERM  GOAL #4   Title  Pt will score at least a 52/56 on the Berg in order to demonstrate decreased risk for falls    Baseline  Berg: 45/56 09/22/2018    Time  4    Period  Weeks    Status  On-going    Target Date  11/17/18            Plan - 11/10/18 1238    Clinical Impression Statement  Pt demonstrates improved sit<>stand mechanics with large amplitude movements and B UE forward reach; 3 episodes of premature posterior weight shift requiring mod assist from PT to correct.  Pt demonstrates increased gross tightness on L trunk/ UE > R with abduction and rotational reaches.  Pt requires consistent verbal cueing for increased arm swing during gait training activities.  Pt will benefit from further skilled therapy to increase independence with ADLs, improve UE ROM, and improve LE strength/ balance.    Personal Factors and Comorbidities  Age;Comorbidity 1    Comorbidities  neurological disorder    Examination-Activity Limitations  Bed Mobility;Transfers    Examination-Participation Restrictions  Community Activity    Stability/Clinical Decision Making  Evolving/Moderate complexity    Clinical Decision Making  Moderate    Rehab Potential  Fair    PT Frequency  2x / week    PT Duration  4 weeks    PT Treatment/Interventions  ADLs/Self Care Home Management;Cryotherapy;Electrical Stimulation;Moist Heat;Traction;DME Instruction;Gait training;Stair training;Functional mobility training;Therapeutic activities;Therapeutic exercise;Balance training;Neuromuscular re-education;Patient/family education;Orthotic Fit/Training;Manual techniques;Passive range of motion;Dry needling;Energy conservation;Joint Manipulations    PT Next Visit Plan  Bed mobility, sit<>stand (various seat heights; recliner), LE strength/ balance    Consulted and Agree with Plan of Care  Patient       Patient will benefit from skilled therapeutic intervention in order to improve the following deficits and impairments:  Abnormal gait,  Improper body mechanics, Decreased coordination, Decreased mobility, Postural dysfunction, Decreased activity tolerance, Decreased endurance, Decreased range of motion, Decreased strength, Hypomobility, Decreased balance, Difficulty walking, Impaired flexibility, Impaired tone  Visit Diagnosis: Muscle weakness (generalized)  Joint stiffness  Gait difficulty  Balance disorder     Problem List There are no active problems to display for this patient.  Pura Spice, PT, DPT # [id number] Chinita Greenland, SPT 11/10/2018, 12:47 PM  [facility] [address] [place], [place], [address] Phone: [phone]   Fax:  [phone]  Name: Daniel [name] MRN: [id number] Date of Birth: [birth year]

## 2018-11-12 ENCOUNTER — Other Ambulatory Visit: Payer: Self-pay

## 2018-11-12 ENCOUNTER — Ambulatory Visit: Payer: Medicare HMO | Admitting: Physical Therapy

## 2018-11-12 DIAGNOSIS — R269 Unspecified abnormalities of gait and mobility: Secondary | ICD-10-CM

## 2018-11-12 DIAGNOSIS — M2560 Stiffness of unspecified joint, not elsewhere classified: Secondary | ICD-10-CM | POA: Diagnosis not present

## 2018-11-12 DIAGNOSIS — R2689 Other abnormalities of gait and mobility: Secondary | ICD-10-CM

## 2018-11-12 DIAGNOSIS — M6281 Muscle weakness (generalized): Secondary | ICD-10-CM | POA: Diagnosis not present

## 2018-11-13 ENCOUNTER — Encounter: Payer: Self-pay | Admitting: Physical Therapy

## 2018-11-13 NOTE — Therapy (Signed)
[facility] [address] [place], [place], [address] Phone: [phone]   Fax:  [phone]  Physical Therapy Treatment  Patient Details  Name: Daniel [name] MRN: [id number] Date of Birth: 12-31-1940 Referring Provider (PT): [name], [place]   Encounter Date: 11/12/2018  PT End of Session - 11/13/18 1813    Visit Number  16    Number of Visits  17    Date for PT Re-Evaluation  11/17/18    Authorization - Visit Number  8    Authorization - Number of Visits  10    PT Start Time  1509    PT Stop Time  1559    PT Time Calculation (min)  50 min    Equipment Utilized During Treatment  Gait belt    Activity Tolerance  Patient tolerated treatment well    Behavior During Therapy  [facility] for tasks assessed/performed       Past Medical History:  Diagnosis Date  . AF (atrial fibrillation) ([place])   . B12 deficiency   . DH (dermatitis herpetiformis)   . Dysphagia   . Hypertension   . Prostate cancer ([place]) 2014    Past Surgical History:  Procedure Laterality Date  . APPENDECTOMY    . CARDIAC CATHETERIZATION    . COLONOSCOPY    . ESOPHAGOGASTRODUODENOSCOPY (EGD) WITH PROPOFOL N/A 10/13/2015   Procedure: ESOPHAGOGASTRODUODENOSCOPY (EGD) WITH PROPOFOL;  Surgeon: [name], MD;  Location: [facility] ENDOSCOPY;  Service: Endoscopy;  Laterality: N/A;  . PACEMAKER PLACEMENT      There were no vitals filed for this visit.  Subjective Assessment - 11/13/18 1809    Subjective  Pt reports pain in L ankle/ foot today 2/2 to pitting edema that pt states has been present for past 2 weeks.  Pt reports performing HEP daily and that sit<>stand transfers and bed mobility are improving; pt reports having episodes of freezing that prevent him from performing these activities, but is otherwise able to perform independently.    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 [name] to get B12 shot.  Pt states he has had physical therapy in the past for L shoulder after a "minor surgery."    Limitations  Standing;House hold activities    Patient Stated Goals  Pt reports he would like to increase strength in LEs and UEs.    Currently in Pain?  Yes    Pain Location  Ankle    Pain Orientation  Left        Therapeutic Exercise: Nustep L6 x10 Marching in // bars forward/ backward - x4 Lateral walking in // bars - x4 Gait in clinic - x6 laps Gait outdoors: grass, curbs, inclines - x3 small laps in front  Neuro: Modified sit<>stand (sitting on foam) with large amplitude forward reach - 2x10 with 10 sec abduction stretch in standing (cueing for forward weight shift) Star balance tapping - x4 B Forward lunge with large amplitude emphasis - x10 B      PT Education - 11/13/18 1812    Education Details  Pt educated on lunge technique    Person(s) Educated  Patient    Methods  Explanation;Demonstration    Comprehension  Verbalized understanding;Returned demonstration;Need further instruction          PT Long Term Goals - 10/20/18 1722  PT LONG TERM GOAL #1   Title  Pt will demonstrate improved functional mobility by scoring at least 62 on FOTO    Baseline  PT FOTO score 22 (age norm 84) 09/22/2018    Time  4    Period  Weeks    Status  On-going    Target Date  11/17/18      PT LONG TERM GOAL #2   Title  Pt will demonstrate increased LE strength by improving B hip flexors, B hip abductors, and B dorsiflexors by 1/2 MMT grade    Baseline  4+/5 hip flexors 4/5 hip abductors 4+/5 dorsiflexors    Time  4    Period  Weeks    Status  Not Met    Target Date  11/17/18      PT LONG TERM GOAL #3   Title  Pt will perform 5xSTS in 12 seconds or less to demonstrate improved B LE power to improve ease with standing up from a car seat.    Baseline  5xsts: 17.81 sec with use of UE assist and multiple attempts to perform stand 09/22/2018     Time  4    Period  Weeks    Status  On-going    Target Date  11/17/18      PT LONG TERM GOAL #4   Title  Pt will score at least a 52/56 on the Berg in order to demonstrate decreased risk for falls    Baseline  Berg: 45/56 09/22/2018    Time  4    Period  Weeks    Status  On-going    Target Date  11/17/18            Plan - 11/13/18 1813    Clinical Impression Statement  Pt has painful pitting edema of L ankle/ foot (grade 3); tx focused on balance and gait exercises.  Pt experiences freezing episodes with various exercises during session that prevent him from being able to complete the task (ie. R forward lunge return, R balance star, sit<>stand).  Freezing episodes in R LE during balance star resulted in R lateral leaning and LOB 3 times that pt was unable to step to correct, PT max assist to return.  Changing verbal cues and external cueing are more effective for instructing patient.  Pt will benefit from further skilled therapy to increase independence with ADLs, improve gross ROM, and improve LE strength/ balance.    Personal Factors and Comorbidities  Age;Comorbidity 1    Comorbidities  neurological disorder    Examination-Activity Limitations  Bed Mobility;Transfers    Examination-Participation Restrictions  Community Activity    Stability/Clinical Decision Making  Evolving/Moderate complexity    Clinical Decision Making  Moderate    Rehab Potential  Fair    PT Frequency  2x / week    PT Duration  4 weeks    PT Treatment/Interventions  ADLs/Self Care Home Management;Cryotherapy;Electrical Stimulation;Moist Heat;Traction;DME Instruction;Gait training;Stair training;Functional mobility training;Therapeutic activities;Therapeutic exercise;Balance training;Neuromuscular re-education;Patient/family education;Orthotic Fit/Training;Manual techniques;Passive range of motion;Dry needling;Energy conservation;Joint Manipulations    PT Next Visit Plan  Bed mobility, sit<>stand, LE strength/  balance; walking outdoors    Consulted and Agree with Plan of Care  Patient       Patient will benefit from skilled therapeutic intervention in order to improve the following deficits and impairments:  Abnormal gait, Improper body mechanics, Decreased coordination, Decreased mobility, Postural dysfunction, Decreased activity tolerance, Decreased endurance, Decreased range of motion, Decreased strength, Hypomobility, Decreased balance, Difficulty  walking, Impaired flexibility, Impaired tone  Visit Diagnosis: Muscle weakness (generalized)  Joint stiffness  Gait difficulty  Balance disorder     Problem List There are no active problems to display for this patient.  Pura Spice, PT, DPT # [id number] Chinita Greenland, SPT 11/13/2018, 6:23 PM  [place] [facility] [address] [place], [place], [address] Phone: [phone]   Fax:  [phone]  Name: LEN AZEEZ MRN: [id number] Date of Birth: Apr 15, 1940

## 2018-11-17 ENCOUNTER — Other Ambulatory Visit: Payer: Self-pay

## 2018-11-17 ENCOUNTER — Ambulatory Visit: Payer: Medicare HMO

## 2018-11-17 ENCOUNTER — Encounter: Payer: Self-pay | Admitting: Physical Therapy

## 2018-11-17 DIAGNOSIS — M6281 Muscle weakness (generalized): Secondary | ICD-10-CM

## 2018-11-17 DIAGNOSIS — R2689 Other abnormalities of gait and mobility: Secondary | ICD-10-CM | POA: Diagnosis not present

## 2018-11-17 DIAGNOSIS — R269 Unspecified abnormalities of gait and mobility: Secondary | ICD-10-CM

## 2018-11-17 DIAGNOSIS — M2560 Stiffness of unspecified joint, not elsewhere classified: Secondary | ICD-10-CM | POA: Diagnosis not present

## 2018-11-17 NOTE — Therapy (Addendum)
[facility] [address] [place], [place], [address] Phone: [phone]   Fax:  [phone]  Physical Therapy Treatment  Patient Details  Name: Daniel [name] MRN: [id number] Date of Birth: 1940-05-08 Referring Provider (PT): [name], [place]   Encounter Date: 11/17/2018  PT End of Session - 11/17/18 1139    Visit Number  17    Number of Visits  17    Date for PT Re-Evaluation  12/15/18    Authorization - Visit Number  9    Authorization - Number of Visits  10    PT Start Time  4742    PT Stop Time  1117    PT Time Calculation (min)  45 min    Equipment Utilized During Treatment  Gait belt    Activity Tolerance  Patient tolerated treatment well    Behavior During Therapy  [facility] for tasks assessed/performed       Past Medical History:  Diagnosis Date  . AF (atrial fibrillation) ([place])   . B12 deficiency   . DH (dermatitis herpetiformis)   . Dysphagia   . Hypertension   . Prostate cancer ([place]) 2014    Past Surgical History:  Procedure Laterality Date  . APPENDECTOMY    . CARDIAC CATHETERIZATION    . COLONOSCOPY    . ESOPHAGOGASTRODUODENOSCOPY (EGD) WITH PROPOFOL N/A 10/13/2015   Procedure: ESOPHAGOGASTRODUODENOSCOPY (EGD) WITH PROPOFOL;  Surgeon: [name], MD;  Location: [facility] ENDOSCOPY;  Service: Endoscopy;  Laterality: N/A;  . PACEMAKER PLACEMENT      There were no vitals filed for this visit.  Subjective Assessment - 11/17/18 1035    Subjective  Patient reported that he is still having L foot/ankle difficulty due to gout (2/10 current pain). No complaints of pain, complaints, or falls at home.    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 [name] to get B12 shot.  Pt states he has had physical therapy in the past for L shoulder after a "minor surgery."    Limitations  Standing;House hold activities     Patient Stated Goals  Pt reports he would like to increase strength in LEs and UEs.    Currently in Pain?  Yes    Pain Score  2     Pain Location  Ankle    Pain Orientation  Left    Pain Descriptors / Indicators  Aching         [facility] PT Assessment - 11/17/18 0001      Standardized Balance Assessment   Standardized Balance Assessment  Berg Balance Test      Berg Balance Test   Sit to Stand  Able to stand  independently using hands    Standing Unsupported  Able to stand safely 2 minutes    Sitting with Back Unsupported but Feet Supported on Floor or Stool  Able to sit safely and securely 2 minutes    Stand to Sit  Controls descent by using hands    Transfers  Able to transfer safely, definite need of hands    Standing Unsupported with Eyes Closed  Able to stand 10 seconds with supervision    Standing Unsupported with Feet Together  Able to place feet together independently and stand 1 minute safely    From Standing, Reach Forward with Outstretched Arm  Can reach forward >12 cm safely (  5")    From Standing Position, Pick up Object from [place] to pick up shoe safely and easily    From Standing Position, Turn to Look Behind Over each Shoulder  Looks behind one side only/other side shows less weight shift    Turn 360 Degrees  Able to turn 360 degrees safely one side only in 4 seconds or less    Standing Unsupported, Alternately Place Feet on Step/Stool  Able to stand independently and safely and complete 8 steps in 20 seconds    Standing Unsupported, One Foot in Front  Able to take small step independently and hold 30 seconds    Standing on One Leg  Able to lift leg independently and hold equal to or more than 3 seconds    Total Score  45        Therapeutic Exercise: Nustep L6 x10, cues for SPM >50  Goals assessed:  FOTO: 5 time sit to stand performed three times. Pt had difficulty with initiating movement, second attempt with improved speed; 35 seconds, difficulty with weight  shift for last rep. Use of BUE throughout.  Also performed with foam on chair, 2x5 reps (29secs & and 22secs) BUE support, educated on forward weight shift intermittently See BERG scores above; 45/56  Strength R/L 4+/5 Hip flexion 5/5 Hip abduction seated 5/5 Hip adduction seated 5/5 Knee extension 5/5 Knee flexion 5/5 Ankle Dorsiflexion 5/5 Ankle Plantarflexion  *indicates pain   pt response/clinical impression: The patient goals were re-assessed this session. The patient demonstrated a regression in 5 times sit to stand goal, challenged by initiation of motion, and BERG score remained unchanged. Pt demonstrated mild improvement in LE strength, and a significant change in his FOTO score indicating and increased ease with functional activities. Subjectively the patient reported that he thinks that PT is helping; things are getting better at home such as getting up out of a chair, his balance, and his balance especially after transfers to transition to ambulation. He stated he is able to walk wherever he wants in his home, and that feels stronger. Overall the patient still exhibits limitations in balance, strength, and functional abilities and would benefit from further skilled PT intervention.      PT Education - 11/17/18 1125    Education Details  PT role, POC, goals    Person(s) Educated  Patient    Methods  Explanation;Demonstration    Comprehension  Verbalized understanding;Returned demonstration;Verbal cues required;Tactile cues required;Need further instruction          PT Long Term Goals - 11/17/18 1051      PT LONG TERM GOAL #1   Title  Pt will demonstrate improved functional mobility by scoring at least 62 on FOTO    Baseline  PT FOTO score 37 (age norm 36) 09/22/2018; FOTO 93 10/26    Time  4    Period  Weeks    Status  Achieved    Target Date  12/15/18      PT LONG TERM GOAL #2   Title  Pt will demonstrate increased LE strength by improving B hip flexors, B hip  abductors, and B dorsiflexors by 1/2 MMT grade    Baseline  4+/5 hip flexors 4/5 hip abductors 4+/5 dorsiflexors ; 10/26 pt scored 5/5 except for R hip flexion    Time  4    Period  Weeks    Status  Partially Met    Target Date  12/15/18      PT  LONG TERM GOAL #3   Title  Pt will perform 5xSTS in 12 seconds or less to demonstrate improved B LE power to improve ease with standing up from a car seat.    Baseline  5xsts: 17.81 sec with use of UE assist and multiple attempts to perform stand 09/22/2018; 1026 35 secs standard chair BUE, 22 seconds from elevated surface with BUE    Time  4    Period  Weeks    Status  On-going    Target Date  12/01/18      PT LONG TERM GOAL #4   Title  Pt will score at least a 52/56 on the Berg in order to demonstrate decreased risk for falls    Baseline  Berg: 45/56 09/22/2018; BERG 45/56    Time  4    Period  Weeks    Status  On-going    Target Date  12/15/18            Plan - 11/17/18 1129    Clinical Impression Statement  The patient goals were re-assessed this session. The patient demonstrated a regression in 5 times sit to stand goal, challenged by initiation of motion, and BERG score remained unchanged. Pt demonstrated mild improvement in LE strength, and a significant change in his FOTO score indicating and increased ease with functional activities. Subjectively the patient reported that he thinks that PT is helping; things are getting better at home such as getting up out of a chair, his balance, and his balance especially after transfers to transition to ambulation. He stated he is able to walk wherever he wants in his home, and that feels stronger. Overall the patient still exhibits limitations in balance, strength, and functional abilities and would benefit from further skilled PT intervention.    Personal Factors and Comorbidities  Age;Comorbidity 1    Comorbidities  neurological disorder    Examination-Activity Limitations  Bed  Mobility;Transfers    Examination-Participation Restrictions  Community Activity    Rehab Potential  Fair    PT Frequency  2x / week    PT Duration  4 weeks    PT Treatment/Interventions  ADLs/Self Care Home Management;Cryotherapy;Electrical Stimulation;Moist Heat;Traction;DME Instruction;Gait training;Stair training;Functional mobility training;Therapeutic activities;Therapeutic exercise;Balance training;Neuromuscular re-education;Patient/family education;Orthotic Fit/Training;Manual techniques;Passive range of motion;Dry needling;Energy conservation;Joint Manipulations    PT Next Visit Plan  Bed mobility, sit<>stand, LE strength/ balance; walking outdoors    Consulted and Agree with Plan of Care  Patient       Patient will benefit from skilled therapeutic intervention in order to improve the following deficits and impairments:  Abnormal gait, Improper body mechanics, Decreased coordination, Decreased mobility, Postural dysfunction, Decreased activity tolerance, Decreased endurance, Decreased range of motion, Decreased strength, Hypomobility, Decreased balance, Difficulty walking, Impaired flexibility, Impaired tone  Visit Diagnosis: Muscle weakness (generalized) - Plan: PT plan of care cert/re-cert  Joint stiffness - Plan: PT plan of care cert/re-cert  Gait difficulty - Plan: PT plan of care cert/re-cert  Balance disorder - Plan: PT plan of care cert/re-cert     Problem List There are no active problems to display for this patient.  Lieutenant Diego PT, DPT 11:43 AM,11/17/18 [phone]  [facility] [address] [place], [place], [address] Phone: [phone]   Fax:  [phone]  Name: Daniel [name] MRN: [id number] Date of Birth: 07-09-1940

## 2018-11-19 ENCOUNTER — Encounter: Payer: Self-pay | Admitting: Physical Therapy

## 2018-11-19 ENCOUNTER — Other Ambulatory Visit: Payer: Self-pay

## 2018-11-19 ENCOUNTER — Ambulatory Visit: Payer: Medicare HMO

## 2018-11-19 ENCOUNTER — Ambulatory Visit
Admission: EM | Admit: 2018-11-19 | Discharge: 2018-11-19 | Disposition: A | Discharge status: Home / Self Care | Payer: Medicare HMO | Attending: Family Medicine | Admitting: Family Medicine

## 2018-11-19 ENCOUNTER — Ambulatory Visit (INDEPENDENT_AMBULATORY_CARE_PROVIDER_SITE_OTHER): Payer: Medicare HMO

## 2018-11-19 DIAGNOSIS — S91302A Unspecified open wound, left foot, initial encounter: Secondary | ICD-10-CM | POA: Diagnosis not present

## 2018-11-19 DIAGNOSIS — L089 Local infection of the skin and subcutaneous tissue, unspecified: Secondary | ICD-10-CM

## 2018-11-19 DIAGNOSIS — M2560 Stiffness of unspecified joint, not elsewhere classified: Secondary | ICD-10-CM

## 2018-11-19 DIAGNOSIS — M7989 Other specified soft tissue disorders: Secondary | ICD-10-CM | POA: Diagnosis not present

## 2018-11-19 DIAGNOSIS — M6281 Muscle weakness (generalized): Secondary | ICD-10-CM

## 2018-11-19 DIAGNOSIS — R269 Unspecified abnormalities of gait and mobility: Secondary | ICD-10-CM

## 2018-11-19 DIAGNOSIS — R2689 Other abnormalities of gait and mobility: Secondary | ICD-10-CM | POA: Diagnosis not present

## 2018-11-19 DIAGNOSIS — M79672 Pain in left foot: Secondary | ICD-10-CM | POA: Diagnosis not present

## 2018-11-19 LAB — CBC WITH DIFFERENTIAL/PLATELET
Abs Immature Granulocytes: 0.04 10*3/uL (ref 0.00–0.07)
Basophils Absolute: 0.1 10*3/uL (ref 0.0–0.1)
Basophils Relative: 1 %
Eosinophils Absolute: 0.1 10*3/uL (ref 0.0–0.5)
Eosinophils Relative: 1 %
HCT: 38.3 % — ABNORMAL LOW (ref 39.0–52.0)
Hemoglobin: 12.7 g/dL — ABNORMAL LOW (ref 13.0–17.0)
Immature Granulocytes: 1 %
Lymphocytes Relative: 13 %
Lymphs Abs: 1.1 10*3/uL (ref 0.7–4.0)
MCH: 28.7 pg (ref 26.0–34.0)
MCHC: 33.2 g/dL (ref 30.0–36.0)
MCV: 86.7 fL (ref 80.0–100.0)
Monocytes Absolute: 0.9 10*3/uL (ref 0.1–1.0)
Monocytes Relative: 10 %
Neutro Abs: 6.4 10*3/uL (ref 1.7–7.7)
Neutrophils Relative %: 74 %
Platelets: 238 10*3/uL (ref 150–400)
RBC: 4.42 MIL/uL (ref 4.22–5.81)
RDW: 13.7 % (ref 11.5–15.5)
WBC: 8.5 10*3/uL (ref 4.0–10.5)
nRBC: 0 % (ref 0.0–0.2)

## 2018-11-19 LAB — BASIC METABOLIC PANEL
Anion gap: 7 (ref 5–15)
BUN: 25 mg/dL — ABNORMAL HIGH (ref 8–23)
CO2: 28 mmol/L (ref 22–32)
Calcium: 9.1 mg/dL (ref 8.9–10.3)
Chloride: 104 mmol/L (ref 98–111)
Creatinine, Ser: 0.83 mg/dL (ref 0.61–1.24)
GFR calc Af Amer: >60 mL/min (ref >60)
GFR calc non Af Amer: >60 mL/min (ref >60)
Glucose, Bld: 94 mg/dL (ref 70–99)
Potassium: 3.8 mmol/L (ref 3.5–5.1)
Sodium: 139 mmol/L (ref 135–145)

## 2018-11-19 MED ORDER — CLINDAMYCIN HCL 150 MG PO CAPS: 450.0000 mg | ORAL_CAPSULE | Freq: Three times a day (TID) | ORAL | 0 refills | Status: AC

## 2018-11-19 NOTE — Discharge Instructions (Signed)
Medications as prescribed.  Appt with Dr. Vickki Muff Tomorrow @ 245.  If you develop fever or worsening symptoms, go to the ER.  Take care  Dr. Lacinda Axon

## 2018-11-19 NOTE — ED Provider Notes (Addendum)
[facility]    CSN: [place]:[id number] Arrival date & time: 11/19/18  1402  History   Chief Complaint Chief Complaint  Patient presents with  . Foot Pain    left   HPI  78 year old male presents with the above complaint.  Patient states that his left foot has been bothering him for the past 2 weeks.  He states that he has developed pain over the past 3 days and associated swelling on the lateral aspect of his foot around the fifth toe.  He reports severe pain, 8/10 in severity.  Also reports swelling and warmth.  It is very tender to palpation.  Denies fever.  Patient is not diabetic.  He is not aware that he has an apparent wound on the plantar aspect of his foot.  No medications or interventions tried.  No other reported symptoms.  No other complaints.  PMH, Surgical Hx, Family Hx, Social History reviewed and updated as below.  Past Medical History:  Diagnosis Date  . AF (atrial fibrillation) ([place])   . B12 deficiency   . DH (dermatitis herpetiformis)   . Dysphagia   . Hypertension   . Prostate cancer ([place]) 2014  Gastritis  Past Surgical History:  Procedure Laterality Date  . APPENDECTOMY    . CARDIAC CATHETERIZATION    . COLONOSCOPY    . ESOPHAGOGASTRODUODENOSCOPY (EGD) WITH PROPOFOL N/A 10/13/2015   Procedure: ESOPHAGOGASTRODUODENOSCOPY (EGD) WITH PROPOFOL;  Surgeon: [name], MD;  [facility] ENDOSCOPY;  Service: Endoscopy;  Laterality: N/A;  . PACEMAKER PLACEMENT     Home Medications    Prior to Admission medications   Medication Sig Start Date End Date Taking? Authorizing Provider  aspirin 81 MG chewable tablet Chew 81 mg by mouth once.   Yes [provider]  chlorhexidine (PERIDEX) 0.12 % solution Use as directed 15 mLs in the mouth or throat 2 (two) times daily.   Yes [provider]  cyanocobalamin 1000 MCG tablet Take 1,000 mcg by mouth daily.   Yes [provider]  latanoprost (XALATAN) 0.005 % ophthalmic solution 1  drop at bedtime.   Yes [provider]  metoprolol succinate (TOPROL-XL) 25 MG 24 hr tablet Take 25 mg by mouth daily.   Yes [provider]  clindamycin (CLEOCIN) 150 MG capsule Take 3 capsules (450 mg total) by mouth 3 (three) times daily for 7 days. 11/19/18 11/26/18  Coral Spikes, DO    Family History Myocardial Infarction (Heart attack) Brother    Myocardial Infarction (Heart attack) Father    Myocardial Infarction (Heart attack) Mother     Social History Social History   Tobacco Use  . Smoking status: Never Smoker  . Smokeless tobacco: Never Used  Substance Use Topics  . Alcohol use: No  . Drug use: No    Allergies   Patient has no known allergies.   Review of Systems Review of Systems  Constitutional: Negative.   Musculoskeletal:       Left foot pain, swelling, redness.   Physical Exam Triage Vital Signs ED Triage Vitals  Enc Vitals Group     BP 11/19/18 1428 130/74     Pulse Rate 11/19/18 1428 87     Resp 11/19/18 1428 16     Temp 11/19/18 1428 97.7 F (36.5 C)     Temp Source 11/19/18 1428 Oral     SpO2 11/19/18 1428 100 %     Weight 11/19/18 1426 155 lb (70.3 kg)     Height  11/19/18 1426 6\' 2"  (1.88 m)     Head Circumference --      Peak Flow --      Pain Score 11/19/18 1426 8     Pain Loc --      Pain Edu? --      Excl. in [place]? --    Updated Vital Signs BP 130/74 (BP Location: Right Arm)   Pulse 87   Temp 97.7 F (36.5 C) (Oral)   Resp 16   Ht 6\' 2"  (1.88 m)   Wt 70.3 kg   SpO2 100%   BMI 19.90 kg/m   Visual Acuity Right Eye Distance:   Left Eye Distance:   Bilateral Distance:    Right Eye Near:   Left Eye Near:    Bilateral Near:     Physical Exam Vitals signs and nursing note reviewed.  Constitutional:      Comments: Frail, elderly male in NAD.  HENT:     Head: Normocephalic and atraumatic.  Eyes:     General:        Right eye: No discharge.        Left eye: No discharge.     Conjunctiva/sclera:  Conjunctivae normal.  Cardiovascular:     Rate and Rhythm: Rhythm irregularly irregular.  Pulmonary:     Effort: Pulmonary effort is normal. No respiratory distress.     Breath sounds: Normal breath sounds.  Feet:     Comments: Left 5th MTP (plantar aspect) - small open wound (see picture). 5th toe with surrounding swelling, warmth and erythema. Neurological:     Mental Status: He is alert.  Psychiatric:        Mood and Affect: Mood normal.        Behavior: Behavior normal.        UC Treatments / Results  Labs (all labs ordered are listed, but only abnormal results are displayed) Labs Reviewed  CBC WITH DIFFERENTIAL/PLATELET - Abnormal; Notable for the following components:      Result Value   Hemoglobin 12.7 (*)    HCT 38.3 (*)    All other components within normal limits  BASIC METABOLIC PANEL - Abnormal; Notable for the following components:   BUN 25 (*)    All other components within normal limits   EKG  Radiology Dg Foot Complete Left  Result Date: 11/19/2018 CLINICAL DATA:  Open wound of left foot. EXAM: LEFT FOOT - COMPLETE 3+ VIEW COMPARISON:  None. FINDINGS: There is significant soft tissue swelling of the left lateral foot overlying the fifth toe and metacarpal with focal soft tissue gas present. No evidence of bony destruction to suggest obvious osteomyelitis. No fracture or dislocation. No soft tissue radiopaque foreign body visualized. No significant arthropathy. IMPRESSION: Soft tissue swelling with focal soft tissue gas in the lateral left foot. No radiographic evidence of osteomyelitis. No soft tissue radiopaque foreign body. Electronically Signed   By: Aletta Edouard M.D.   On: 11/19/2018 15:28   Procedures Procedures (including critical care time)  Medications Ordered in UC Medications - No data to display  Initial Impression / Assessment and Plan / UC Course  I have reviewed the triage vital signs and the nursing notes.  Pertinent labs & imaging  results that were available during my care of the patient were reviewed by me and considered in my medical decision making (see chart for details).    78 year old male presents with an open wound and subsequent infection. Xray negative for osteomyelitis.  CBC with normal WBC. Placing on Clindamycin. Appt arranged with [facility] podiatry tomorrow.  Final Clinical Impressions(s) / UC Diagnoses   Final diagnoses:  Open wound of left foot, initial encounter  Foot infection     Discharge Instructions     Medications as prescribed.  Appt with Dr. Vickki Muff Tomorrow @ 245.  If you develop fever or worsening symptoms, go to the ER.  Take care  Dr. Lacinda Axon     ED Prescriptions    Medication Sig Dispense Auth. Provider   clindamycin (CLEOCIN) 150 MG capsule Take 3 capsules (450 mg total) by mouth 3 (three) times daily for 7 days. 63 capsule Thersa Salt G, DO     PDMP not reviewed this encounter.   Coral Spikes, DO 11/19/18 [place], [place], DO 11/19/18 1608

## 2018-11-19 NOTE — ED Triage Notes (Signed)
Patient complains of left foot swelling without injury. Patient states that this has been an issue for 2 weeks.

## 2018-11-19 NOTE — Therapy (Signed)
[facility] [address] [place], [place], [address] Phone: [phone]   Fax:  [phone]  Physical Therapy Treatment  Patient Details  Name: Daniel [name] MRN: [id number] Date of Birth: 01-22-1941 Referring Provider (PT): Daniel [name], [place]   Encounter Date: 11/19/2018    Past Medical History:  Diagnosis Date  . AF (atrial fibrillation) ([place])   . B12 deficiency   . DH (dermatitis herpetiformis)   . Dysphagia   . Hypertension   . Prostate cancer ([place]) 2014    Past Surgical History:  Procedure Laterality Date  . APPENDECTOMY    . CARDIAC CATHETERIZATION    . COLONOSCOPY    . ESOPHAGOGASTRODUODENOSCOPY (EGD) WITH PROPOFOL N/A 10/13/2015   Procedure: ESOPHAGOGASTRODUODENOSCOPY (EGD) WITH PROPOFOL;  Surgeon: Daniel Sails, MD;  Location: [facility] ENDOSCOPY;  Service: Endoscopy;  Laterality: N/A;  . PACEMAKER PLACEMENT      There were no vitals filed for this visit.  Subjective Assessment - 11/19/18 1252    Subjective  Pt reports 8/10 pain in L foot/ toes with grade 2+ pitting edema; pt reports soaking L foot/ ankle in hot water and salt every night which makes it feel better.  Pt says that swelling is better in the morning until he begins WB and that he had little to no swelling this morning when he got up.  No pain otherwise noted.  Pt reports performing HEP several times a day.  No reported falls or near falls.    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 Chrystine [name] to get B12 shot.  Pt states he has had physical therapy in the past for L shoulder after a "minor surgery."    Limitations  Standing;House hold activities    Patient Stated Goals  Pt reports he would like to increase strength in LEs and UEs.    Currently in Pain?  Yes    Pain Score  8     Pain Location  Ankle    Pain Orientation  Left       Therapeutic  Exercise: Nustep L6 x10 min Modified sit<>stand with B UE use on chair arms - x10  Neuro: (frequent verbal and tactile cueing throughout) Seated, large amplitude UE reaches: down, up and out -1x10with 10 second abduction hold with PT assisted stretch  Seated large amplitude lunge B directions with unilateral UE reach - 1x10 Bwith 10 second hold Modified sit<>stand with emphasis on large amplitude movement and UE abduction in standing -2x10with 10 second hold Forward lunge with emphasis on large amplitude step and abduction stretch with lunge - 1x10 B with 10 second hold   Clinical Impression: Pt with painful pitting edema in distal L LE (ankle and foot), grade 2+ with light redness and palpable warmth.  No calf tenderness; pain in dorsum of foot with dorsiflexion.  Pt suggested to call primary care physician and educated on risk of DVT and importance of ruling out with physician.  Pt tolerated treatment well with only 2 occurences of premature posterior weight shift with sit<>stand.  Pt educated to try seated large amplitude stretching at home prior to attempting to stand up from the recliner as a warm up.  Pt will see neurologist on November 16 for a second opinion diagnosis; may not return to physical therapy until after 2/2 cost.  Pt will benefit from further skilled therapy  to increase gross ROM/ strength, improve gait, and maximize function/ independence.    PT Long Term Goals - 11/17/18 1051      PT LONG TERM GOAL #1   Title  Pt will demonstrate improved functional mobility by scoring at least 62 on FOTO    Baseline  PT FOTO score 37 (age norm 17) 09/22/2018; FOTO 93 10/26    Time  4    Period  Weeks    Status  Achieved    Target Date  12/15/18      PT LONG TERM GOAL #2   Title  Pt will demonstrate increased LE strength by improving B hip flexors, B hip abductors, and B dorsiflexors by 1/2 MMT grade    Baseline  4+/5 hip flexors 4/5 hip abductors 4+/5 dorsiflexors ; 10/26 pt  scored 5/5 except for R hip flexion    Time  4    Period  Weeks    Status  Partially Met    Target Date  12/15/18      PT LONG TERM GOAL #3   Title  Pt will perform 5xSTS in 12 seconds or less to demonstrate improved B LE power to improve ease with standing up from a car seat.    Baseline  5xsts: 17.81 sec with use of UE assist and multiple attempts to perform stand 09/22/2018; 1026 35 secs standard chair BUE, 22 seconds from elevated surface with BUE    Time  4    Period  Weeks    Status  On-going    Target Date  12/01/18      PT LONG TERM GOAL #4   Title  Pt will score at least a 52/56 on the Berg in order to demonstrate decreased risk for falls    Baseline  Berg: 45/56 09/22/2018; BERG 45/56    Time  4    Period  Weeks    Status  On-going    Target Date  12/15/18            Plan - 11/19/18 1529    Clinical Impression Statement  Pt with painful pitting edema in distal L LE (ankle and foot), grade 2+ with light redness and palpable warmth.  No calf tenderness; pain in dorsum of foot with dorsiflexion.  Pt suggested to call primary care physician and educated on risk of DVT and importance of ruling out with physician.  Pt tolerated treatment well with only 2 occurences of premature posterior weight shift with sit<>stand.  Pt educated to try seated large amplitude stretching at home prior to attempting to stand up from the recliner as a warm up.  Pt will see neurologist on November 16 for a second opinion diagnosis; may not return to physical therapy until after doctor visit 2/2 cost.  Pt will benefit from further skilled therapy to increase gross ROM/ strength, improve gait, and maximize function/ independence.    Personal Factors and Comorbidities  Age;Comorbidity 1    Comorbidities  neurological disorder    Examination-Activity Limitations  Bed Mobility;Transfers    Examination-Participation Restrictions  Community Activity    Clinical Decision Making  Moderate    Rehab Potential   Fair    PT Frequency  2x / week    PT Duration  4 weeks    PT Treatment/Interventions  ADLs/Self Care Home Management;Cryotherapy;Electrical Stimulation;Moist Heat;Traction;DME Instruction;Gait training;Stair training;Functional mobility training;Therapeutic activities;Therapeutic exercise;Balance training;Neuromuscular re-education;Patient/family education;Orthotic Fit/Training;Manual techniques;Passive range of motion;Dry needling;Energy conservation;Joint Manipulations    PT Next Visit Plan  Sit<>stand, large amplitude movements, balance/ gait    Consulted and Agree with Plan of Care  Patient       Patient will benefit from skilled therapeutic intervention in order to improve the following deficits and impairments:  Abnormal gait, Improper body mechanics, Decreased coordination, Decreased mobility, Postural dysfunction, Decreased activity tolerance, Decreased endurance, Decreased range of motion, Decreased strength, Hypomobility, Decreased balance, Difficulty walking, Impaired flexibility, Impaired tone  Visit Diagnosis: Muscle weakness (generalized)  Joint stiffness  Gait difficulty  Balance disorder     Problem List There are no active problems to display for this patient.   Chinita Greenland, SPT 11/19/2018, 3:33 PM  [facility] [address] [place], [place], [address] Phone: [phone]   Fax:  [phone]  Name: FABIEN TRAVELSTEAD MRN: [id number] Date of Birth: [birth year]

## 2018-11-20 DIAGNOSIS — L02612 Cutaneous abscess of left foot: Secondary | ICD-10-CM | POA: Diagnosis not present

## 2018-11-20 DIAGNOSIS — L03032 Cellulitis of left toe: Secondary | ICD-10-CM | POA: Diagnosis not present

## 2018-11-20 DIAGNOSIS — L97523 Non-pressure chronic ulcer of other part of left foot with necrosis of muscle: Secondary | ICD-10-CM | POA: Diagnosis not present

## 2018-11-25 DIAGNOSIS — L03032 Cellulitis of left toe: Secondary | ICD-10-CM | POA: Diagnosis not present

## 2018-11-25 DIAGNOSIS — L97523 Non-pressure chronic ulcer of other part of left foot with necrosis of muscle: Secondary | ICD-10-CM | POA: Diagnosis not present

## 2018-11-25 DIAGNOSIS — L02612 Cutaneous abscess of left foot: Secondary | ICD-10-CM | POA: Diagnosis not present

## 2018-12-02 DIAGNOSIS — L02612 Cutaneous abscess of left foot: Secondary | ICD-10-CM | POA: Diagnosis not present

## 2018-12-02 DIAGNOSIS — L97523 Non-pressure chronic ulcer of other part of left foot with necrosis of muscle: Secondary | ICD-10-CM | POA: Diagnosis not present

## 2018-12-02 DIAGNOSIS — L03032 Cellulitis of left toe: Secondary | ICD-10-CM | POA: Diagnosis not present

## 2018-12-08 DIAGNOSIS — E519 Thiamine deficiency, unspecified: Secondary | ICD-10-CM | POA: Diagnosis not present

## 2018-12-08 DIAGNOSIS — G20 Parkinson's disease: Secondary | ICD-10-CM | POA: Diagnosis not present

## 2018-12-08 DIAGNOSIS — E559 Vitamin D deficiency, unspecified: Secondary | ICD-10-CM | POA: Diagnosis not present

## 2018-12-08 DIAGNOSIS — R1313 Dysphagia, pharyngeal phase: Secondary | ICD-10-CM | POA: Diagnosis not present

## 2018-12-09 DIAGNOSIS — L02612 Cutaneous abscess of left foot: Secondary | ICD-10-CM | POA: Diagnosis not present

## 2018-12-09 DIAGNOSIS — Z0000 Encounter for general adult medical examination without abnormal findings: Secondary | ICD-10-CM | POA: Diagnosis not present

## 2018-12-09 DIAGNOSIS — E538 Deficiency of other specified B group vitamins: Secondary | ICD-10-CM | POA: Diagnosis not present

## 2018-12-09 DIAGNOSIS — Z125 Encounter for screening for malignant neoplasm of prostate: Secondary | ICD-10-CM | POA: Diagnosis not present

## 2018-12-09 DIAGNOSIS — L97523 Non-pressure chronic ulcer of other part of left foot with necrosis of muscle: Secondary | ICD-10-CM | POA: Diagnosis not present

## 2018-12-09 DIAGNOSIS — L03032 Cellulitis of left toe: Secondary | ICD-10-CM | POA: Diagnosis not present

## 2018-12-09 DIAGNOSIS — I482 Chronic atrial fibrillation, unspecified: Secondary | ICD-10-CM | POA: Diagnosis not present

## 2018-12-23 DIAGNOSIS — L03032 Cellulitis of left toe: Secondary | ICD-10-CM | POA: Diagnosis not present

## 2018-12-23 DIAGNOSIS — L97523 Non-pressure chronic ulcer of other part of left foot with necrosis of muscle: Secondary | ICD-10-CM | POA: Diagnosis not present

## 2018-12-23 DIAGNOSIS — L02612 Cutaneous abscess of left foot: Secondary | ICD-10-CM | POA: Diagnosis not present

## 2019-01-13 DIAGNOSIS — L02612 Cutaneous abscess of left foot: Secondary | ICD-10-CM | POA: Diagnosis not present

## 2019-01-13 DIAGNOSIS — L03032 Cellulitis of left toe: Secondary | ICD-10-CM | POA: Diagnosis not present

## 2019-01-13 DIAGNOSIS — L97523 Non-pressure chronic ulcer of other part of left foot with necrosis of muscle: Secondary | ICD-10-CM | POA: Diagnosis not present

## 2019-01-20 DIAGNOSIS — L02612 Cutaneous abscess of left foot: Secondary | ICD-10-CM | POA: Diagnosis not present

## 2019-01-20 DIAGNOSIS — L03032 Cellulitis of left toe: Secondary | ICD-10-CM | POA: Diagnosis not present

## 2019-01-20 DIAGNOSIS — L97523 Non-pressure chronic ulcer of other part of left foot with necrosis of muscle: Secondary | ICD-10-CM | POA: Diagnosis not present

## 2019-01-26 ENCOUNTER — Other Ambulatory Visit: Payer: Self-pay | Admitting: Podiatry

## 2019-01-27 ENCOUNTER — Other Ambulatory Visit: Payer: Self-pay

## 2019-01-27 ENCOUNTER — Encounter
Admission: RE | Admit: 2019-01-27 | Discharge: 2019-01-27 | Disposition: A | Discharge status: Home / Self Care | Payer: Medicare HMO | Source: Ambulatory Visit | Attending: Podiatry | Admitting: Podiatry

## 2019-01-27 HISTORY — DX: Parkinsonism, unspecified: G20.C

## 2019-01-27 HISTORY — DX: Atherosclerotic heart disease of native coronary artery without angina pectoris: I25.10

## 2019-01-27 HISTORY — DX: Presence of cardiac pacemaker: Z95.0

## 2019-01-27 HISTORY — DX: Parkinson's disease: G20

## 2019-01-27 NOTE — Patient Instructions (Addendum)
Your procedure is scheduled on: Friday, January 30, 2019 Report to Day Surgery on the 2nd floor of the [facility]. To find out your arrival time, please call [phone] between 1PM - 3PM on: Thursday, January 7  REMEMBER: Instructions that are not followed completely may result in serious medical risk, up to and including death; or upon the discretion of your surgeon and anesthesiologist your surgery may need to be rescheduled.  Do not eat food after midnight the night before surgery.  No gum chewing, lozengers or hard candies.  You may however, drink CLEAR liquids up to 2 hours before you are scheduled to arrive for your surgery. Do not drink anything within 2 hours of the start of your surgery.  Clear liquids include: - water  - apple juice without pulp - gatorade - black coffee or tea (Do NOT add milk or creamers to the coffee or tea) Do NOT drink anything that is not on this list.  ENSURE PRE-SURGERY CARBOHYDRATE DRINK:  Complete drinking 3 hours prior to surgery.  No Alcohol for 24 hours before or after surgery.  No Smoking including e-cigarettes for 24 hours prior to surgery.  No chewable tobacco products for at least 6 hours prior to surgery.  No nicotine patches on the day of surgery.  On the morning of surgery brush your teeth with toothpaste and water, you may rinse your mouth with mouthwash if you wish. Do not swallow any toothpaste or mouthwash.  Notify your doctor if there is any change in your medical condition (cold, fever, infection).  Do not wear jewelry, make-up, hairpins, clips or nail polish.  Do not wear lotions, powders, or perfumes.   Do not shave 48 hours prior to surgery.   Contacts and dentures may not be worn into surgery.  Do not bring valuables to the hospital, including drivers license, insurance or credit cards.  [facility] is not responsible for any belongings or valuables.   TAKE THESE MEDICATIONS THE MORNING OF SURGERY:  1.   Carbidopa-levodopa 2.  Cephalexin  Use CHG Soap or wipes as directed on instruction sheet.  Follow recommendations from Cardiologist, Pulmonologist or PCP regarding stopping Aspirin.  Now!  Stop Anti-inflammatories (NSAIDS) such as Advil, Aleve, Ibuprofen, Motrin, Naproxen, Naprosyn and Aspirin based products such as Excedrin, Goodys Powder, BC Powder. (May take Tylenol or Acetaminophen if needed.)  Now!  Stop ANY OVER THE COUNTER supplements until after surgery. (May continue Vitamin D, Vitamin B, and multivitamin.)  Wear comfortable clothing (specific to your surgery type) to the hospital.  If you are being discharged the day of surgery, you will not be allowed to drive home. You will need a responsible adult to drive you home and stay with you that night.   If you are taking public transportation, you will need to have a responsible adult with you. Please confirm with your physician that it is acceptable to use public transportation.   Please call [phone] if you have any questions about these instructions.

## 2019-01-28 ENCOUNTER — Encounter
Admission: RE | Admit: 2019-01-28 | Discharge: 2019-01-28 | Disposition: A | Discharge status: Home / Self Care | Payer: Medicare HMO | Source: Ambulatory Visit | Attending: Podiatry | Admitting: Podiatry

## 2019-01-28 ENCOUNTER — Other Ambulatory Visit: Admission: RE | Admit: 2019-01-28 | Payer: Medicare HMO | Source: Ambulatory Visit

## 2019-01-28 DIAGNOSIS — Z01818 Encounter for other preprocedural examination: Secondary | ICD-10-CM | POA: Insufficient documentation

## 2019-01-28 DIAGNOSIS — Z20822 Contact with and (suspected) exposure to covid-19: Secondary | ICD-10-CM | POA: Diagnosis not present

## 2019-01-28 DIAGNOSIS — I482 Chronic atrial fibrillation, unspecified: Secondary | ICD-10-CM | POA: Diagnosis not present

## 2019-01-28 DIAGNOSIS — I4891 Unspecified atrial fibrillation: Secondary | ICD-10-CM | POA: Diagnosis not present

## 2019-01-28 LAB — SARS CORONAVIRUS 2 (TAT 6-24 HRS): SARS Coronavirus 2: NEGATIVE

## 2019-01-30 ENCOUNTER — Ambulatory Visit: Payer: Medicare HMO | Admitting: Anesthesiology

## 2019-01-30 ENCOUNTER — Encounter
Admission: RE | Disposition: A | Discharge status: Home / Self Care | Payer: Self-pay | Source: Home / Self Care | Attending: Podiatry

## 2019-01-30 ENCOUNTER — Encounter: Payer: Self-pay | Admitting: Podiatry

## 2019-01-30 ENCOUNTER — Other Ambulatory Visit: Payer: Self-pay

## 2019-01-30 ENCOUNTER — Ambulatory Visit
Admission: RE | Admit: 2019-01-30 | Discharge: 2019-01-30 | Disposition: A | Discharge status: Home / Self Care | Payer: Medicare HMO | Attending: Podiatry | Admitting: Podiatry

## 2019-01-30 DIAGNOSIS — R1310 Dysphagia, unspecified: Secondary | ICD-10-CM | POA: Diagnosis not present

## 2019-01-30 DIAGNOSIS — I2510 Atherosclerotic heart disease of native coronary artery without angina pectoris: Secondary | ICD-10-CM | POA: Insufficient documentation

## 2019-01-30 DIAGNOSIS — G20 Parkinson's disease: Secondary | ICD-10-CM | POA: Diagnosis not present

## 2019-01-30 DIAGNOSIS — I10 Essential (primary) hypertension: Secondary | ICD-10-CM | POA: Diagnosis not present

## 2019-01-30 DIAGNOSIS — M71072 Abscess of bursa, left ankle and foot: Secondary | ICD-10-CM | POA: Insufficient documentation

## 2019-01-30 DIAGNOSIS — E559 Vitamin D deficiency, unspecified: Secondary | ICD-10-CM | POA: Insufficient documentation

## 2019-01-30 DIAGNOSIS — L97523 Non-pressure chronic ulcer of other part of left foot with necrosis of muscle: Secondary | ICD-10-CM | POA: Diagnosis not present

## 2019-01-30 DIAGNOSIS — Z79899 Other long term (current) drug therapy: Secondary | ICD-10-CM | POA: Insufficient documentation

## 2019-01-30 DIAGNOSIS — Z950 Presence of cardiac pacemaker: Secondary | ICD-10-CM | POA: Insufficient documentation

## 2019-01-30 DIAGNOSIS — Z8546 Personal history of malignant neoplasm of prostate: Secondary | ICD-10-CM | POA: Insufficient documentation

## 2019-01-30 DIAGNOSIS — Z7982 Long term (current) use of aspirin: Secondary | ICD-10-CM | POA: Diagnosis not present

## 2019-01-30 DIAGNOSIS — I4891 Unspecified atrial fibrillation: Secondary | ICD-10-CM | POA: Diagnosis not present

## 2019-01-30 HISTORY — PX: FASCIOTOMY: SHX132

## 2019-01-30 SURGERY — FASCIOTOMY, UPPER EXTREMITY
Anesthesia: General | Site: Foot | Laterality: Left

## 2019-01-30 MED ORDER — LACTATED RINGERS IV SOLN: INTRAVENOUS | Status: DC

## 2019-01-30 MED ORDER — BUPIVACAINE HCL (PF) 0.5 % IJ SOLN
INTRAMUSCULAR | Status: AC
  Filled 2019-01-30: qty 30

## 2019-01-30 MED ORDER — ONDANSETRON HCL 4 MG/2ML IJ SOLN: 4.0000 mg | Freq: Four times a day (QID) | INTRAMUSCULAR | Status: DC | PRN

## 2019-01-30 MED ORDER — LIDOCAINE-EPINEPHRINE 1 %-1:100000 IJ SOLN
INTRAMUSCULAR | Status: AC
  Filled 2019-01-30: qty 1

## 2019-01-30 MED ORDER — PHENYLEPHRINE HCL (PRESSORS) 10 MG/ML IV SOLN
INTRAVENOUS | Status: DC | PRN
  Administered 2019-01-30 (×2): 100 ug via INTRAVENOUS
  Administered 2019-01-30: 50 ug via INTRAVENOUS
  Administered 2019-01-30: 100 ug via INTRAVENOUS

## 2019-01-30 MED ORDER — CEPHALEXIN 500 MG PO CAPS: 500.0000 mg | ORAL_CAPSULE | Freq: Three times a day (TID) | ORAL | 0 refills | Status: AC

## 2019-01-30 MED ORDER — FENTANYL CITRATE (PF) 100 MCG/2ML IJ SOLN
INTRAMUSCULAR | Status: DC | PRN
  Administered 2019-01-30 (×4): 25 ug via INTRAVENOUS

## 2019-01-30 MED ORDER — BUPIVACAINE HCL (PF) 0.5 % IJ SOLN
INTRAMUSCULAR | Status: DC | PRN
  Administered 2019-01-30: 5 mL

## 2019-01-30 MED ORDER — CEFAZOLIN SODIUM-DEXTROSE 2-4 GM/100ML-% IV SOLN: 2.0000 g | INTRAVENOUS | Status: DC

## 2019-01-30 MED ORDER — ONDANSETRON HCL 4 MG/2ML IJ SOLN: 4.0000 mg | Freq: Once | INTRAMUSCULAR | Status: DC | PRN

## 2019-01-30 MED ORDER — ONDANSETRON HCL 4 MG/2ML IJ SOLN
INTRAMUSCULAR | Status: DC | PRN
  Administered 2019-01-30: 4 mg via INTRAVENOUS

## 2019-01-30 MED ORDER — FAMOTIDINE 20 MG PO TABS
20.0000 mg | ORAL_TABLET | Freq: Once | ORAL | Status: AC
  Administered 2019-01-30: 20 mg via ORAL

## 2019-01-30 MED ORDER — LIDOCAINE-EPINEPHRINE 1 %-1:100000 IJ SOLN
INTRAMUSCULAR | Status: DC | PRN
  Administered 2019-01-30: 5 mL

## 2019-01-30 MED ORDER — FAMOTIDINE 20 MG PO TABS
ORAL_TABLET | ORAL | Status: AC
  Filled 2019-01-30: qty 1

## 2019-01-30 MED ORDER — PROPOFOL 10 MG/ML IV BOLUS
INTRAVENOUS | Status: DC | PRN
  Administered 2019-01-30 (×2): 20 mg via INTRAVENOUS

## 2019-01-30 MED ORDER — POVIDONE-IODINE 7.5 % EX SOLN
Freq: Once | CUTANEOUS | Status: DC
  Filled 2019-01-30: qty 118

## 2019-01-30 MED ORDER — CEFAZOLIN SODIUM-DEXTROSE 2-4 GM/100ML-% IV SOLN
INTRAVENOUS | Status: AC
  Filled 2019-01-30: qty 100

## 2019-01-30 MED ORDER — ONDANSETRON HCL 4 MG PO TABS: 4.0000 mg | ORAL_TABLET | Freq: Four times a day (QID) | ORAL | Status: DC | PRN

## 2019-01-30 MED ORDER — LIDOCAINE HCL (PF) 1 % IJ SOLN
INTRAMUSCULAR | Status: AC
  Filled 2019-01-30: qty 30

## 2019-01-30 MED ORDER — FENTANYL CITRATE (PF) 100 MCG/2ML IJ SOLN: 25.0000 ug | INTRAMUSCULAR | Status: DC | PRN

## 2019-01-30 MED ORDER — HYDROCODONE-ACETAMINOPHEN 5-325 MG PO TABS: ORAL_TABLET | ORAL | 0 refills | Status: DC

## 2019-01-30 MED ORDER — PROPOFOL 500 MG/50ML IV EMUL
INTRAVENOUS | Status: DC | PRN
  Administered 2019-01-30: 75 ug/kg/min via INTRAVENOUS

## 2019-01-30 MED ORDER — FENTANYL CITRATE (PF) 100 MCG/2ML IJ SOLN
INTRAMUSCULAR | Status: AC
  Filled 2019-01-30: qty 2

## 2019-01-30 SURGICAL SUPPLY — 61 items
BLADE OSC/SAGITTAL MD 5.5X18 (BLADE) IMPLANT
BLADE OSCILLATING/SAGITTAL (BLADE)
BLADE SURG 15 STRL LF DISP TIS (BLADE) ×2 IMPLANT
BLADE SURG 15 STRL SS (BLADE) ×2
BLADE SW THK.38XMED LNG THN (BLADE) IMPLANT
BNDG COHESIVE 4X5 TAN STRL (GAUZE/BANDAGES/DRESSINGS) ×2 IMPLANT
BNDG COHESIVE 6X5 TAN STRL LF (GAUZE/BANDAGES/DRESSINGS) ×2 IMPLANT
BNDG CONFORM 2 STRL LF (GAUZE/BANDAGES/DRESSINGS) ×2 IMPLANT
BNDG CONFORM 3 STRL LF (GAUZE/BANDAGES/DRESSINGS) ×2 IMPLANT
BNDG ELASTIC 4X5.8 VLCR STR LF (GAUZE/BANDAGES/DRESSINGS) ×2 IMPLANT
BNDG ESMARK 4X12 TAN STRL LF (GAUZE/BANDAGES/DRESSINGS) ×2 IMPLANT
BNDG GAUZE 4.5X4.1 6PLY STRL (MISCELLANEOUS) ×2 IMPLANT
CANISTER SUCT 1200ML W/VALVE (MISCELLANEOUS) ×2 IMPLANT
CANISTER SUCT 3000ML PPV (MISCELLANEOUS) ×2 IMPLANT
COVER WAND RF STERILE (DRAPES) ×2 IMPLANT
CUFF TOURN SGL QUICK 12 (TOURNIQUET CUFF) IMPLANT
CUFF TOURN SGL QUICK 18X4 (TOURNIQUET CUFF) ×2 IMPLANT
DRAPE FLUOR MINI C-ARM 54X84 (DRAPES) IMPLANT
DURAPREP 26ML APPLICATOR (WOUND CARE) ×2 IMPLANT
ELECT REM PT RETURN 9FT ADLT (ELECTROSURGICAL) ×2
ELECTRODE REM PT RTRN 9FT ADLT (ELECTROSURGICAL) ×1 IMPLANT
GAUZE PACKING 1/4 X5 YD (GAUZE/BANDAGES/DRESSINGS) ×2 IMPLANT
GAUZE PACKING IODOFORM 1/2 (PACKING) ×2 IMPLANT
GAUZE PACKING IODOFORM 1X5 (MISCELLANEOUS) IMPLANT
GAUZE SPONGE 4X4 12PLY STRL (GAUZE/BANDAGES/DRESSINGS) ×2 IMPLANT
GAUZE XEROFORM 1X8 LF (GAUZE/BANDAGES/DRESSINGS) ×2 IMPLANT
GLOVE BIO SURGEON STRL SZ7.5 (GLOVE) ×2 IMPLANT
GLOVE INDICATOR 8.0 STRL GRN (GLOVE) ×2 IMPLANT
GOWN STRL REUS W/ TWL LRG LVL3 (GOWN DISPOSABLE) ×2 IMPLANT
GOWN STRL REUS W/TWL LRG LVL3 (GOWN DISPOSABLE) ×2
HANDPIECE VERSAJET DEBRIDEMENT (MISCELLANEOUS) IMPLANT
IV NS 1000ML (IV SOLUTION) ×1
IV NS 1000ML BAXH (IV SOLUTION) ×1 IMPLANT
LABEL OR SOLS (LABEL) ×2 IMPLANT
NEEDLE FILTER BLUNT 18X 1/2SAF (NEEDLE) ×1
NEEDLE FILTER BLUNT 18X1 1/2 (NEEDLE) ×1 IMPLANT
NEEDLE HYPO 25X1 1.5 SAFETY (NEEDLE) ×6 IMPLANT
NS IRRIG 500ML POUR BTL (IV SOLUTION) ×2 IMPLANT
PACK EXTREMITY ARMC (MISCELLANEOUS) ×2 IMPLANT
PAD ABD DERMACEA PRESS 5X9 (GAUZE/BANDAGES/DRESSINGS) ×2 IMPLANT
PENCIL ELECTRO HAND CTR (MISCELLANEOUS) ×2 IMPLANT
PULSAVAC PLUS IRRIG FAN TIP (DISPOSABLE)
RASP SM TEAR CROSS CUT (RASP) IMPLANT
SHIELD FULL FACE ANTIFOG 7M (MISCELLANEOUS) ×2 IMPLANT
SOL .9 NS 3000ML IRR  AL (IV SOLUTION)
SOL .9 NS 3000ML IRR UROMATIC (IV SOLUTION) IMPLANT
STOCKINETTE IMPERVIOUS 9X36 MD (GAUZE/BANDAGES/DRESSINGS) ×2 IMPLANT
STRAP SAFETY 5IN WIDE (MISCELLANEOUS) ×2 IMPLANT
SUT ETHILON 2 0 FS 18 (SUTURE) ×2 IMPLANT
SUT ETHILON 3-0 FS-10 30 BLK (SUTURE) ×2
SUT ETHILON 4-0 (SUTURE) ×1
SUT ETHILON 4-0 FS2 18XMFL BLK (SUTURE) ×1
SUT VIC AB 3-0 SH 27 (SUTURE) ×1
SUT VIC AB 3-0 SH 27X BRD (SUTURE) ×1 IMPLANT
SUT VIC AB 4-0 FS2 27 (SUTURE) ×2 IMPLANT
SUTURE EHLN 3-0 FS-10 30 BLK (SUTURE) ×1 IMPLANT
SUTURE ETHLN 4-0 FS2 18XMF BLK (SUTURE) ×1 IMPLANT
SWAB CULTURE AMIES ANAERIB BLU (MISCELLANEOUS) ×2 IMPLANT
SYR 10ML LL (SYRINGE) ×4 IMPLANT
SYR 3ML LL SCALE MARK (SYRINGE) ×2 IMPLANT
TIP FAN IRRIG PULSAVAC PLUS (DISPOSABLE) IMPLANT

## 2019-01-30 NOTE — Discharge Instructions (Addendum)
[facility]  POST OPERATIVE INSTRUCTIONS FOR DR. TROXLER, DR. FOWLER, AND DR. BAK[facility] PODIATRY DEPARTMENT   1. Take your medication as prescribed.  Pain medication should be taken only as needed.  2. Keep the dressing clean, dry and intact.  3. Keep your foot elevated above the heart level for the first 48 hours.  4. Walking to the bathroom and brief periods of walking are acceptable, unless we have instructed you to be non-weight bearing.  5. Always wear your post-op shoe when walking.  Always use your crutches if you are to be non-weight bearing.  6. Do not take a shower. Baths are permissible as long as the foot is kept out of the water.   7. Every hour you are awake:  - Bend your knee 15 times. - Flex foot 15 times - Massage calf 15 times  8. Call [facility] ([phone]) if any of the following problems occur: - You develop a temperature or fever. - The bandage becomes saturated with blood. - Medication does not stop your pain. - Injury of the foot occurs. - Any symptoms of infection including redness, odor, or red streaks running from wound.  AMBULATORY SURGERY  DISCHARGE INSTRUCTIONS  1) The drugs that you were given will stay in your system until tomorrow so for the next 24 hours you should not: A) Drive an automobile B) Make any legal decisions C) Drink any alcoholic beverage  2) You may resume regular meals tomorrow.  Today it is better to start with liquids and gradually work up to solid foods. You may eat anything you prefer, but it is better to start with liquids, then soup and crackers, and gradually work up to solid foods.  3) Please notify your doctor immediately if you have any unusual bleeding, trouble breathing, redness and pain at the surgery site, drainage, fever, or pain not relieved by medication.  4) Additional Instructions:   Please contact your physician with any problems or Same  Day Surgery at [phone], Monday through Friday 6 am to 4 pm, or [place] [place] Main number at [phone]. 

## 2019-01-30 NOTE — Transfer of Care (Signed)
Immediate Anesthesia Transfer of Care Note  Patient: Daniel [name]  Procedure(s) Performed: I&D BELOW FASCIA FOOT SINGLE LEFT (Left Foot)  Patient Location: PACU  Anesthesia Type:General  Level of Consciousness: sedated  Airway & Oxygen Therapy: Patient Spontanous Breathing and Patient connected to face mask  Post-op Assessment: Report given to RN and Post -op Vital signs reviewed and stable  Post vital signs: Reviewed and stable  Last Vitals:  Vitals Value Taken Time  BP 95/57 01/30/19 1017  Temp    Pulse 73 01/30/19 1020  Resp 12 01/30/19 1020  SpO2 100 % 01/30/19 1020  Vitals shown include unvalidated device data.  Last Pain:  Vitals:   01/30/19 1017  TempSrc:   PainSc: (P) Asleep      Patients Stated Pain Goal: 0 ([id number])  Complications: No apparent anesthesia complications

## 2019-01-30 NOTE — Anesthesia Preprocedure Evaluation (Addendum)
Anesthesia Evaluation  Patient identified by MRN, date of birth, ID band Patient awake    Reviewed: Allergy & Precautions, NPO status , Patient's Chart, lab work & pertinent test results  History of Anesthesia Complications Negative for: history of anesthetic complications  Airway Mallampati: III       Dental  (+) Teeth Intact   Pulmonary neg pulmonary ROS,    breath sounds clear to auscultation       Cardiovascular Exercise Tolerance: Good METShypertension, + CAD  + dysrhythmias Atrial Fibrillation + pacemaker  Rhythm:Regular     Neuro/Psych negative neurological ROS  negative psych ROS   GI/Hepatic Neg liver ROS, dysphagia   Endo/Other  negative endocrine ROS  Renal/GU negative Renal ROS     Musculoskeletal   Abdominal Normal abdominal exam  (+)   Peds negative pediatric ROS (+)  Hematology negative hematology ROS (+)   Anesthesia Other Findings Past Medical History: No date: AF (atrial fibrillation) ([facility]) No date: B12 deficiency No date: Coronary artery disease 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) No date: Dysphagia No date: Hypertension No date: Presence of permanent cardiac pacemaker     Comment:  2006 No date: Primary Parkinsonism ([place]) 2014: Prostate cancer ([place]) EF 50%  Reproductive/Obstetrics                            Anesthesia Physical  Anesthesia Plan  ASA: III  Anesthesia Plan: General   Post-op Pain Management:    Induction: Intravenous  PONV Risk Score and Plan: TIVA  Airway Management Planned: Nasal Cannula  Additional Equipment:   Intra-op Plan:   Post-operative Plan:   Informed Consent: I have reviewed the patients History and Physical, chart, labs and discussed the procedure including the risks, benefits and alternatives for the proposed anesthesia with the patient or authorized representative who has indicated his/her understanding and  acceptance.       Plan Discussed with: CRNA  Anesthesia Plan Comments:         Anesthesia Quick Evaluation

## 2019-01-30 NOTE — Op Note (Signed)
Operative note   Surgeon:Karina Lenderman Lawyer: Jettie Booze PA student    Preop diagnosis:1. Left fifth MTPJ infected bursa  2.  Full-thickness ulcer plantar left fifth MTPJ to capsule    Postop diagnosis: Same    Procedure:1. I&D left fifth MTPJ bursa and abscess  2.  Excisional debridement to joint and capsule left fifth MTPJ.    EBL: Minimal    Anesthesia:local and IV sedation.  Local consisted of a one-to-one mixture of 0.5% bupivacaine and 1% lidocaine with epinephrine.    Hemostasis: Lidocaine with epinephrine infiltrated along the incision site    Specimen: Deep wound culture left fifth MTPJ    Complications: None    Operative indications:Daniel [name] is an 79 y.o. that presents today for surgical intervention.  The risks/benefits/alternatives/complications have been discussed and consent has been given.    Procedure:  Patient was brought into the OR and placed on the operating table in thesupine position. After anesthesia was obtained theleft lower extremity was prepped and draped in usual sterile fashion.  Attention was directed to the lateral aspect of the fifth MTPJ where longitudinal incision was performed.  Sharp and blunt dissection carried down through the subcutaneous tissue to the deeper tissue.  At this time the plantar ulceration was probed and an incision was made to connect the ulceration plantarly to the capsular tissue laterally.  A deep wound culture was performed at this time.  No obvious purulent drainage was noted.  No severe necrotic tissue was noted.  The wound was flushed with copious amounts of irrigation.  Next the plantar fifth MTPJ ulceration was excisionally debrided with a 15 blade through the epidermis dermal layer to subcutaneous tissue to the level of the capsule.  Pre and post debridement measurements were approximately 5 mm in diameter.  After final evaluation and further flushing of the wound closure was performed with a 3-0 nylon for  the lateral incision.  The plantar ulceration was packed with half-inch iodoform impregnated gauze packing strip.  A bulky sterile dressing was applied.    Patient tolerated the procedure and anesthesia well.  Was transported from the OR to the PACU with all vital signs stable and vascular status intact. To be discharged per routine protocol.  Will follow up in approximately 1 week in the outpatient clinic.

## 2019-01-31 NOTE — Anesthesia Postprocedure Evaluation (Signed)
Anesthesia Post Note  Patient: Daniel [name]  Procedure(s) Performed: I&D BELOW FASCIA FOOT SINGLE LEFT (Left Foot)  Patient location during evaluation: PACU Anesthesia Type: General Level of consciousness: awake and alert and oriented Pain management: pain level controlled Vital Signs Assessment: post-procedure vital signs reviewed and stable Respiratory status: spontaneous breathing Cardiovascular status: blood pressure returned to baseline Anesthetic complications: no     Last Vitals:  Vitals:   01/30/19 1132 01/30/19 1214  BP: 101/62 (!) 124/91  Pulse: 78 82  Resp: 14 16  Temp: (!) 36.1 C 36.7 C  SpO2: 100% 100%    Last Pain:  Vitals:   01/30/19 1214  TempSrc: Temporal  PainSc: 0-No pain                 Zana Biancardi

## 2019-02-04 LAB — AEROBIC/ANAEROBIC CULTURE W GRAM STAIN (SURGICAL/DEEP WOUND)

## 2019-02-05 DIAGNOSIS — L97523 Non-pressure chronic ulcer of other part of left foot with necrosis of muscle: Secondary | ICD-10-CM | POA: Diagnosis not present

## 2019-02-05 DIAGNOSIS — L02612 Cutaneous abscess of left foot: Secondary | ICD-10-CM | POA: Diagnosis not present

## 2019-02-05 DIAGNOSIS — L03032 Cellulitis of left toe: Secondary | ICD-10-CM | POA: Diagnosis not present

## 2019-02-05 NOTE — Progress Notes (Signed)
Pharmacy - Antimicrobial Stewardship Brief Note  Podiatry performed debrided of L 5th MTPJ on 1/8.  Wound cultures growing and achromobacter denitrificans and Enterococcus faecalis.  Patient was discharged on cephalexin  Plan: - discussed with Dr Vickki Muff,  Antibiotics were changed to amoxicillin/clavulonate already.  Suggested adding bactrim to treat achromobacter if wound does not improve   Doreene Eland, PharmD, BCPS.   Work Cell: [phone] 02/05/2019 5:05 PM

## 2019-03-03 DIAGNOSIS — L97523 Non-pressure chronic ulcer of other part of left foot with necrosis of muscle: Secondary | ICD-10-CM | POA: Diagnosis not present

## 2019-03-24 DIAGNOSIS — L03032 Cellulitis of left toe: Secondary | ICD-10-CM | POA: Diagnosis not present

## 2019-03-24 DIAGNOSIS — L02612 Cutaneous abscess of left foot: Secondary | ICD-10-CM | POA: Diagnosis not present

## 2019-03-24 DIAGNOSIS — L97523 Non-pressure chronic ulcer of other part of left foot with necrosis of muscle: Secondary | ICD-10-CM | POA: Diagnosis not present

## 2019-04-10 DIAGNOSIS — G20 Parkinson's disease: Secondary | ICD-10-CM | POA: Diagnosis not present

## 2019-05-05 DIAGNOSIS — L97523 Non-pressure chronic ulcer of other part of left foot with necrosis of muscle: Secondary | ICD-10-CM | POA: Diagnosis not present

## 2019-06-16 DIAGNOSIS — L02612 Cutaneous abscess of left foot: Secondary | ICD-10-CM | POA: Diagnosis not present

## 2019-06-16 DIAGNOSIS — L03032 Cellulitis of left toe: Secondary | ICD-10-CM | POA: Diagnosis not present

## 2019-06-16 DIAGNOSIS — L97523 Non-pressure chronic ulcer of other part of left foot with necrosis of muscle: Secondary | ICD-10-CM | POA: Diagnosis not present

## 2019-08-03 DIAGNOSIS — R972 Elevated prostate specific antigen [PSA]: Secondary | ICD-10-CM | POA: Diagnosis not present

## 2019-08-03 DIAGNOSIS — N401 Enlarged prostate with lower urinary tract symptoms: Secondary | ICD-10-CM | POA: Diagnosis not present

## 2019-08-03 DIAGNOSIS — D400 Neoplasm of uncertain behavior of prostate: Secondary | ICD-10-CM | POA: Diagnosis not present

## 2019-08-03 DIAGNOSIS — C61 Malignant neoplasm of prostate: Secondary | ICD-10-CM | POA: Diagnosis not present

## 2019-08-11 DIAGNOSIS — H2513 Age-related nuclear cataract, bilateral: Secondary | ICD-10-CM | POA: Diagnosis not present

## 2019-08-11 DIAGNOSIS — H401111 Primary open-angle glaucoma, right eye, mild stage: Secondary | ICD-10-CM | POA: Diagnosis not present

## 2019-08-11 DIAGNOSIS — H401122 Primary open-angle glaucoma, left eye, moderate stage: Secondary | ICD-10-CM | POA: Diagnosis not present

## 2019-08-12 DIAGNOSIS — N401 Enlarged prostate with lower urinary tract symptoms: Secondary | ICD-10-CM | POA: Diagnosis not present

## 2019-08-14 DIAGNOSIS — C61 Malignant neoplasm of prostate: Secondary | ICD-10-CM | POA: Diagnosis not present

## 2019-08-14 DIAGNOSIS — D400 Neoplasm of uncertain behavior of prostate: Secondary | ICD-10-CM | POA: Diagnosis not present

## 2019-08-14 DIAGNOSIS — N401 Enlarged prostate with lower urinary tract symptoms: Secondary | ICD-10-CM | POA: Diagnosis not present

## 2019-08-18 DIAGNOSIS — M79675 Pain in left toe(s): Secondary | ICD-10-CM | POA: Diagnosis not present

## 2019-08-18 DIAGNOSIS — G20 Parkinson's disease: Secondary | ICD-10-CM | POA: Diagnosis not present

## 2019-08-18 DIAGNOSIS — R1313 Dysphagia, pharyngeal phase: Secondary | ICD-10-CM | POA: Diagnosis not present

## 2019-08-18 DIAGNOSIS — B351 Tinea unguium: Secondary | ICD-10-CM | POA: Diagnosis not present

## 2019-08-18 DIAGNOSIS — L97523 Non-pressure chronic ulcer of other part of left foot with necrosis of muscle: Secondary | ICD-10-CM | POA: Diagnosis not present

## 2019-08-18 DIAGNOSIS — E559 Vitamin D deficiency, unspecified: Secondary | ICD-10-CM | POA: Diagnosis not present

## 2019-08-18 DIAGNOSIS — M79674 Pain in right toe(s): Secondary | ICD-10-CM | POA: Diagnosis not present

## 2019-08-21 DIAGNOSIS — N401 Enlarged prostate with lower urinary tract symptoms: Secondary | ICD-10-CM | POA: Diagnosis not present

## 2019-08-21 DIAGNOSIS — N210 Calculus in bladder: Secondary | ICD-10-CM | POA: Diagnosis not present

## 2019-09-02 NOTE — H&P (Signed)
NAME: Daniel [name], Daniel [name] MEDICAL RECORD YO:[id number] ACCOUNT [id number] DATE OF BIRTH:March 15, 1940 FACILITY: [facility] LOCATION:  PHYSICIAN:Ailyn Gladd Farrel Conners, MD  HISTORY AND PHYSICAL  DATE OF ADMISSION:  09/17/2019  CHIEF COMPLAINT:  Difficulty voiding and bladder stones.  HISTORY OF PRESENT ILLNESS:  The patient is a 79 year old white male with significant lower urinary tract symptoms.  International Prostate Symptom Score was 18 with a quality of life score of 3.  He also has a history of grade group 1.  Prostate cancer initially diagnosed in 2013 and has been on active surveillance since then.  Most recent PSA was 5.3 ng/mL in July of this year.  Evaluation in the office included an Uroflow study that indicated a peak flow rate of 2 mL per second  with an average flow rate of 1.5 mL per second and a postvoid residual of 264 mL based upon a voided volume of 50 mL.  Bladder wall density was 33 g.  Prostate ultrasound indicated a 67.9 mL prostate with trilobar BPH and intravesical growth of median  lobe.  Cystoscopy confirmed trilobar BPH with large intravesical median lobe and the presence of 4 bladder stones with the largest stone measuring 15 mm in size and the smallest stone measuring 4 mm in size.  The bladder was heavily trabeculated.  The  patient comes in now for litholapaxy of the bladder stones with the holmium laser and photovaporization of the prostate with the GreenLight laser.  ALLERGIES:  ALLERGIC TO ATORVASTATIN.  CURRENT MEDICATIONS:  Include metoprolol, carbidopa, levodopa and tamsulosin.  PAST SURGICAL HISTORY: 1.  Repair deviated septum 1977. 2.  Appendectomy 1961. 3.  Photovaporization of prostate with GreenLight laser 2013.  PAST AND CURRENT MEDICAL CONDITIONS:  1.  Parkinson's disease. 2.  Atrial fibrillation.  REVIEW OF SYSTEMS:  The patient denies chest pain, shortness of breath, diabetes, stroke.  SOCIAL HISTORY:  The patient denied tobacco or alcohol  use.  FAMILY HISTORY:  Father died at age 70 of heart disease.  Mother died at age 81 of heart disease, brother age 60, died of heart disease.  PHYSICAL EXAMINATION: VITAL SIGNS:  Height was 6 feet 0 inches, weight 164 pounds, BMI 22. GENERAL:  Well-nourished white male in no acute distress. HEENT:  Sclerae were clear.  Pupils are equally round, reactive to light and accommodation.  Extraocular movements intact. NECK:  No palpable neck masses.  Thyroid gland was smooth, nontender. LYMPH NODE:  No palpable cervical or inguinal adenopathy. PULMONARY:  Lungs clear to auscultation. CARDIOVASCULAR:  Regular rhythm and rate without audible murmurs. ABDOMEN:  Soft, nontender abdomen. GENITOURINARY:  Circumcised.  Testes were smooth, nontender. RECTAL:  Greater than 50 gram smooth, nontender prostate. NEUROMUSCULAR:  Alert and oriented x3.  IMPRESSION: 1.  Benign prostatic hypertrophy with bladder outlet obstruction. 2.  Bladder stones. 3.  Grade group 1 prostate cancer -- on active surveillance since 2013.  PLAN: 1.  Litholapaxy of bladder stones with holmium laser. 2.  Photovaporization of prostate with GreenLight laser.  PN/NUANCE  D:09/01/2019 T:09/01/2019 JOB:[id number]

## 2019-09-03 DIAGNOSIS — Z9889 Other specified postprocedural states: Secondary | ICD-10-CM | POA: Diagnosis not present

## 2019-09-03 DIAGNOSIS — Z950 Presence of cardiac pacemaker: Secondary | ICD-10-CM | POA: Diagnosis not present

## 2019-09-03 DIAGNOSIS — R0602 Shortness of breath: Secondary | ICD-10-CM | POA: Diagnosis not present

## 2019-09-03 DIAGNOSIS — G20 Parkinson's disease: Secondary | ICD-10-CM | POA: Diagnosis not present

## 2019-09-03 DIAGNOSIS — I482 Chronic atrial fibrillation, unspecified: Secondary | ICD-10-CM | POA: Diagnosis not present

## 2019-09-03 DIAGNOSIS — Z45018 Encounter for adjustment and management of other part of cardiac pacemaker: Secondary | ICD-10-CM | POA: Diagnosis not present

## 2019-09-03 DIAGNOSIS — Z01818 Encounter for other preprocedural examination: Secondary | ICD-10-CM | POA: Diagnosis not present

## 2019-09-07 ENCOUNTER — Other Ambulatory Visit: Payer: Self-pay

## 2019-09-07 ENCOUNTER — Encounter
Admission: RE | Admit: 2019-09-07 | Discharge: 2019-09-07 | Disposition: A | Discharge status: Home / Self Care | Payer: Medicare HMO | Source: Ambulatory Visit | Attending: Urology | Admitting: Urology

## 2019-09-07 NOTE — Patient Instructions (Signed)
Your procedure is scheduled on: Thursday 09/17/19.  Report to DAY SURGERY DEPARTMENT LOCATED ON 2ND FLOOR MEDICAL MALL ENTRANCE. To find out your arrival time please call [phone] between 1PM - 3PM on Wednesday 09/16/19.   Remember: Instructions that are not followed completely may result in serious medical risk, up to and including death, or upon the discretion of your surgeon and anesthesiologist your surgery may need to be rescheduled.     __X__ 1. Do not eat food after midnight the night before your procedure.                 No gum chewing or hard candies. You may drink clear liquids up to 2 hours                 before you are scheduled to arrive for your surgery- DO NOT drink clear                 liquids within 2 hours of the start of your surgery.                 Clear Liquids include:  water, apple juice without pulp, clear carbohydrate                 drink such as Clearfast or Gatorade, Black Coffee or Tea (Do not add                 milk or creamer to coffee or tea).  __X__2.  On the morning of surgery brush your teeth with toothpaste and water, you may rinse your mouth with mouthwash if you wish.  Do not swallow any toothpaste or mouthwash.    __X__ 3.  No Alcohol for 24 hours before or after surgery.  __X__ 4.  Do Not Smoke or use e-cigarettes For 24 Hours Prior to Your Surgery.                 Do not use any chewable tobacco products for at least 6 hours prior to                 surgery.  __X__5.  Notify your doctor if there is any change in your medical condition      (cold, fever, infections).      Do NOT wear jewelry, make-up, hairpins, clips or nail polish. Do NOT wear lotions, powders, or perfumes.  Do NOT shave 48 hours prior to surgery. Men may shave face and neck. Do NOT bring valuables to the hospital.     [facility] is not responsible for any belongings or valuables.   Contacts, dentures/partials or body piercings may not be worn into surgery.  Bring a case for your contacts, glasses or hearing aids, a denture cup will be supplied.    Patients discharged the day of surgery will not be allowed to drive home.     __X__ Take these medicines the morning of surgery with A SIP OF WATER:     1. carbidopa-levodopa (SINEMET IR)       __X__ Shower before you arrive on the morning of surgery.  __X__ Stop Blood Thinners: Aspirin. Your last dose will bo on Sunday 09/13/19.  __X__ Stop Anti-inflammatories 7 days before surgery such as Advil, Ibuprofen, Motrin, BC or Goodies Powder, Naprosyn, Naproxen, Aleve, Aspirin, Meloxicam. May take Tylenol if needed for pain or discomfort.   __X__Do not start taking any new herbal supplements or vitamins prior to your procedure.  Wear comfortable clothing (specific to your surgery type) to the hospital.  Plan for stool softeners for home use; pain medications have a tendency to cause constipation. You can also help prevent constipation by eating foods high in fiber such as fruits and vegetables and drinking plenty of fluids as your diet allows.  After surgery, you can prevent lung complications by doing breathing exercises.Take deep breaths and cough every 1-2 hours. Your doctor may order a device called an Incentive Spirometer to help you take deep breaths.  Please call the [place] Department at [phone] if you have any questions about these instructions.

## 2019-09-15 ENCOUNTER — Other Ambulatory Visit: Payer: Self-pay

## 2019-09-15 ENCOUNTER — Other Ambulatory Visit
Admission: RE | Admit: 2019-09-15 | Discharge: 2019-09-15 | Disposition: A | Discharge status: Home / Self Care | Payer: Medicare HMO | Source: Ambulatory Visit | Attending: Urology | Admitting: Urology

## 2019-09-15 DIAGNOSIS — Z20822 Contact with and (suspected) exposure to covid-19: Secondary | ICD-10-CM | POA: Insufficient documentation

## 2019-09-15 DIAGNOSIS — Z01812 Encounter for preprocedural laboratory examination: Secondary | ICD-10-CM | POA: Diagnosis not present

## 2019-09-15 LAB — BASIC METABOLIC PANEL
Anion gap: 10 (ref 5–15)
BUN: 30 mg/dL — ABNORMAL HIGH (ref 8–23)
CO2: 25 mmol/L (ref 22–32)
Calcium: 9.1 mg/dL (ref 8.9–10.3)
Chloride: 105 mmol/L (ref 98–111)
Creatinine, Ser: 1.3 mg/dL — ABNORMAL HIGH (ref 0.61–1.24)
GFR calc Af Amer: >60 mL/min (ref >60)
GFR calc non Af Amer: 52 mL/min — ABNORMAL LOW (ref >60)
Glucose, Bld: 98 mg/dL (ref 70–99)
Potassium: 4.3 mmol/L (ref 3.5–5.1)
Sodium: 140 mmol/L (ref 135–145)

## 2019-09-15 LAB — CBC
HCT: 38.9 % — ABNORMAL LOW (ref 39.0–52.0)
Hemoglobin: 13 g/dL (ref 13.0–17.0)
MCH: 29.5 pg (ref 26.0–34.0)
MCHC: 33.4 g/dL (ref 30.0–36.0)
MCV: 88.4 fL (ref 80.0–100.0)
Platelets: 209 10*3/uL (ref 150–400)
RBC: 4.4 MIL/uL (ref 4.22–5.81)
RDW: 14.7 % (ref 11.5–15.5)
WBC: 7 10*3/uL (ref 4.0–10.5)
nRBC: 0 % (ref 0.0–0.2)

## 2019-09-16 LAB — SARS CORONAVIRUS 2 (TAT 6-24 HRS): SARS Coronavirus 2: NEGATIVE

## 2019-09-17 ENCOUNTER — Other Ambulatory Visit: Payer: Self-pay

## 2019-09-17 ENCOUNTER — Ambulatory Visit
Admission: RE | Admit: 2019-09-17 | Discharge: 2019-09-17 | Disposition: A | Discharge status: Home / Self Care | Payer: Medicare HMO | Attending: Urology | Admitting: Urology

## 2019-09-17 ENCOUNTER — Ambulatory Visit: Payer: Medicare HMO | Admitting: Certified Registered"

## 2019-09-17 ENCOUNTER — Encounter
Admission: RE | Disposition: A | Discharge status: Home / Self Care | Payer: Self-pay | Source: Home / Self Care | Attending: Urology

## 2019-09-17 ENCOUNTER — Encounter: Payer: Self-pay | Admitting: Urology

## 2019-09-17 DIAGNOSIS — I10 Essential (primary) hypertension: Secondary | ICD-10-CM | POA: Insufficient documentation

## 2019-09-17 DIAGNOSIS — I4891 Unspecified atrial fibrillation: Secondary | ICD-10-CM | POA: Insufficient documentation

## 2019-09-17 DIAGNOSIS — Z7982 Long term (current) use of aspirin: Secondary | ICD-10-CM | POA: Diagnosis not present

## 2019-09-17 DIAGNOSIS — G20 Parkinson's disease: Secondary | ICD-10-CM | POA: Insufficient documentation

## 2019-09-17 DIAGNOSIS — Z79899 Other long term (current) drug therapy: Secondary | ICD-10-CM | POA: Insufficient documentation

## 2019-09-17 DIAGNOSIS — Z950 Presence of cardiac pacemaker: Secondary | ICD-10-CM | POA: Insufficient documentation

## 2019-09-17 DIAGNOSIS — N401 Enlarged prostate with lower urinary tract symptoms: Secondary | ICD-10-CM | POA: Insufficient documentation

## 2019-09-17 DIAGNOSIS — N400 Enlarged prostate without lower urinary tract symptoms: Secondary | ICD-10-CM | POA: Diagnosis not present

## 2019-09-17 DIAGNOSIS — N320 Bladder-neck obstruction: Secondary | ICD-10-CM | POA: Diagnosis not present

## 2019-09-17 DIAGNOSIS — N138 Other obstructive and reflux uropathy: Secondary | ICD-10-CM

## 2019-09-17 DIAGNOSIS — N210 Calculus in bladder: Secondary | ICD-10-CM | POA: Insufficient documentation

## 2019-09-17 DIAGNOSIS — I2510 Atherosclerotic heart disease of native coronary artery without angina pectoris: Secondary | ICD-10-CM | POA: Diagnosis not present

## 2019-09-17 HISTORY — PX: CYSTOSCOPY WITH LITHOLAPAXY: SHX1425

## 2019-09-17 HISTORY — PX: GREEN LIGHT LASER TURP (TRANSURETHRAL RESECTION OF PROSTATE: SHX6260

## 2019-09-17 SURGERY — GREEN LIGHT LASER TURP (TRANSURETHRAL RESECTION OF PROSTATE
Anesthesia: General

## 2019-09-17 MED ORDER — DEXAMETHASONE SODIUM PHOSPHATE 10 MG/ML IJ SOLN
INTRAMUSCULAR | Status: DC | PRN
  Administered 2019-09-17: 10 mg via INTRAVENOUS

## 2019-09-17 MED ORDER — LIDOCAINE HCL URETHRAL/MUCOSAL 2 % EX GEL
CUTANEOUS | Status: DC | PRN
  Administered 2019-09-17: 1

## 2019-09-17 MED ORDER — LIDOCAINE HCL (CARDIAC) PF 100 MG/5ML IV SOSY
PREFILLED_SYRINGE | INTRAVENOUS | Status: DC | PRN
  Administered 2019-09-17: 80 mg via INTRAVENOUS

## 2019-09-17 MED ORDER — CEFAZOLIN SODIUM-DEXTROSE 1-4 GM/50ML-% IV SOLN
INTRAVENOUS | Status: AC
  Filled 2019-09-17: qty 50

## 2019-09-17 MED ORDER — CEFAZOLIN SODIUM-DEXTROSE 1-4 GM/50ML-% IV SOLN
1.0000 g | Freq: Once | INTRAVENOUS | Status: AC
  Administered 2019-09-17: 2 g via INTRAVENOUS

## 2019-09-17 MED ORDER — FENTANYL CITRATE (PF) 100 MCG/2ML IJ SOLN
INTRAMUSCULAR | Status: AC
  Filled 2019-09-17: qty 2

## 2019-09-17 MED ORDER — PHENYLEPHRINE HCL (PRESSORS) 10 MG/ML IV SOLN
INTRAVENOUS | Status: DC | PRN
  Administered 2019-09-17 (×3): 100 ug via INTRAVENOUS

## 2019-09-17 MED ORDER — ROCURONIUM BROMIDE 100 MG/10ML IV SOLN
INTRAVENOUS | Status: DC | PRN
  Administered 2019-09-17: 10 mg via INTRAVENOUS
  Administered 2019-09-17: 40 mg via INTRAVENOUS
  Administered 2019-09-17: 10 mg via INTRAVENOUS
  Administered 2019-09-17: 20 mg via INTRAVENOUS

## 2019-09-17 MED ORDER — CHLORHEXIDINE GLUCONATE 0.12 % MT SOLN
OROMUCOSAL | Status: AC
  Filled 2019-09-17: qty 15

## 2019-09-17 MED ORDER — LIDOCAINE HCL (PF) 2 % IJ SOLN
INTRAMUSCULAR | Status: AC
  Filled 2019-09-17: qty 5

## 2019-09-17 MED ORDER — FAMOTIDINE 20 MG PO TABS
ORAL_TABLET | ORAL | Status: AC
  Administered 2019-09-17: 20 mg via ORAL
  Filled 2019-09-17: qty 1

## 2019-09-17 MED ORDER — PROPOFOL 10 MG/ML IV BOLUS
INTRAVENOUS | Status: AC
  Filled 2019-09-17: qty 20

## 2019-09-17 MED ORDER — URIBEL 118 MG PO CAPS: 1.0000 | ORAL_CAPSULE | Freq: Four times a day (QID) | ORAL | 3 refills | Status: DC | PRN

## 2019-09-17 MED ORDER — DOCUSATE SODIUM 100 MG PO CAPS: 200.0000 mg | ORAL_CAPSULE | Freq: Two times a day (BID) | ORAL | 3 refills | Status: DC

## 2019-09-17 MED ORDER — CHLORHEXIDINE GLUCONATE 0.12 % MT SOLN
15.0000 mL | Freq: Once | OROMUCOSAL | Status: AC
  Administered 2019-09-17: 15 mL via OROMUCOSAL

## 2019-09-17 MED ORDER — BELLADONNA ALKALOIDS-OPIUM 16.2-60 MG RE SUPP
RECTAL | Status: AC
  Filled 2019-09-17: qty 1

## 2019-09-17 MED ORDER — SODIUM CHLORIDE 0.9 % IV SOLN
INTRAVENOUS | Status: DC | PRN
  Administered 2019-09-17: 25 ug/min via INTRAVENOUS

## 2019-09-17 MED ORDER — PROPOFOL 10 MG/ML IV BOLUS
INTRAVENOUS | Status: DC | PRN
  Administered 2019-09-17: 50 mg via INTRAVENOUS

## 2019-09-17 MED ORDER — SUCCINYLCHOLINE CHLORIDE 20 MG/ML IJ SOLN
INTRAMUSCULAR | Status: DC | PRN
  Administered 2019-09-17: 100 mg via INTRAVENOUS

## 2019-09-17 MED ORDER — ONDANSETRON HCL 4 MG/2ML IJ SOLN
INTRAMUSCULAR | Status: DC | PRN
  Administered 2019-09-17: 4 mg via INTRAVENOUS

## 2019-09-17 MED ORDER — SUGAMMADEX SODIUM 200 MG/2ML IV SOLN
INTRAVENOUS | Status: DC | PRN
  Administered 2019-09-17: 150 mg via INTRAVENOUS

## 2019-09-17 MED ORDER — ORAL CARE MOUTH RINSE: 15.0000 mL | Freq: Once | OROMUCOSAL | Status: AC

## 2019-09-17 MED ORDER — SULFAMETHOXAZOLE-TRIMETHOPRIM 800-160 MG PO TABS: 1.0000 | ORAL_TABLET | Freq: Two times a day (BID) | ORAL | 0 refills | Status: DC

## 2019-09-17 MED ORDER — LIDOCAINE HCL URETHRAL/MUCOSAL 2 % EX GEL
CUTANEOUS | Status: AC
  Filled 2019-09-17: qty 10

## 2019-09-17 MED ORDER — FAMOTIDINE 20 MG PO TABS: 20.0000 mg | ORAL_TABLET | Freq: Once | ORAL | Status: AC

## 2019-09-17 MED ORDER — FENTANYL CITRATE (PF) 100 MCG/2ML IJ SOLN
INTRAMUSCULAR | Status: DC | PRN
Start: 2019-09-17 — End: 2019-09-17
  Administered 2019-09-17 (×2): 25 ug via INTRAVENOUS
  Administered 2019-09-17: 50 ug via INTRAVENOUS

## 2019-09-17 MED ORDER — BELLADONNA ALKALOIDS-OPIUM 16.2-60 MG RE SUPP
RECTAL | Status: DC | PRN
Start: 2019-09-17 — End: 2019-09-17
  Administered 2019-09-17: 1 via RECTAL

## 2019-09-17 MED ORDER — LACTATED RINGERS IV SOLN: INTRAVENOUS | Status: DC

## 2019-09-17 MED ORDER — FENTANYL CITRATE (PF) 100 MCG/2ML IJ SOLN
25.0000 ug | INTRAMUSCULAR | Status: DC | PRN
  Administered 2019-09-17: 25 ug via INTRAVENOUS

## 2019-09-17 SURGICAL SUPPLY — 33 items
ADAPTER IRRIG TUBE 2 SPIKE SOL (ADAPTER) ×6 IMPLANT
BAG DRAIN CYSTO-URO LG1000N (MISCELLANEOUS) ×3 IMPLANT
BAG URINE DRAIN 2000ML AR STRL (UROLOGICAL SUPPLIES) ×3 IMPLANT
CATH FOLEY 2WAY  5CC 20FR SIL (CATHETERS)
CATH FOLEY 2WAY 5CC 20FR SIL (CATHETERS) IMPLANT
CNTNR SPEC 2.5X3XGRAD LEK (MISCELLANEOUS)
CONT SPEC 4OZ STER OR WHT (MISCELLANEOUS)
CONTAINER SPEC 2.5X3XGRAD LEK (MISCELLANEOUS) IMPLANT
FIBER LASER FLEXIVA 1000 (UROLOGICAL SUPPLIES) ×2 IMPLANT
FIBER LASER FLEXIVA 550 (UROLOGICAL SUPPLIES) IMPLANT
GLOVE BIO SURGEON STRL SZ7.5 (GLOVE) ×3 IMPLANT
GOWN STRL REUS W/ TWL LRG LVL3 (GOWN DISPOSABLE) ×1 IMPLANT
GOWN STRL REUS W/ TWL LRG LVL4 (GOWN DISPOSABLE) ×1 IMPLANT
GOWN STRL REUS W/ TWL XL LVL3 (GOWN DISPOSABLE) ×1 IMPLANT
GOWN STRL REUS W/TWL LRG LVL3 (GOWN DISPOSABLE) ×2
GOWN STRL REUS W/TWL LRG LVL4 (GOWN DISPOSABLE) ×2
GOWN STRL REUS W/TWL XL LVL3 (GOWN DISPOSABLE) ×2
IV NS 1000ML (IV SOLUTION) ×2
IV NS 1000ML BAXH (IV SOLUTION) ×1 IMPLANT
IV SET PRIMARY 15D 139IN B9900 (IV SETS) ×3 IMPLANT
KIT TURNOVER CYSTO (KITS) ×3 IMPLANT
LASER GREENLIGHT XPS PROCEDURE (MISCELLANEOUS) ×2 IMPLANT
LASER GRNLGT MOXY FIBER 750UM (MISCELLANEOUS) ×2 IMPLANT
PACK CYSTO AR (MISCELLANEOUS) ×3 IMPLANT
SET IRRIG Y TYPE TUR BLADDER L (SET/KITS/TRAYS/PACK) ×3 IMPLANT
SOL .9 NS 3000ML IRR  AL (IV SOLUTION) ×20
SOL .9 NS 3000ML IRR UROMATIC (IV SOLUTION) ×4 IMPLANT
SURGILUBE 2OZ TUBE FLIPTOP (MISCELLANEOUS) ×3 IMPLANT
SYR TOOMEY IRRIG 70ML (MISCELLANEOUS) ×3
SYRINGE IRR TOOMEY STRL 70CC (SYRINGE) ×3 IMPLANT
SYRINGE TOOMEY IRRIG 70ML (MISCELLANEOUS) IMPLANT
WATER STERILE IRR 1000ML POUR (IV SOLUTION) ×3 IMPLANT
WATER STERILE IRR 3000ML UROMA (IV SOLUTION) ×3 IMPLANT

## 2019-09-17 NOTE — Transfer of Care (Signed)
Immediate Anesthesia Transfer of Care Note  Patient: Daniel [name]  Procedure(s) Performed: GREEN LIGHT LASER TURP (TRANSURETHRAL RESECTION OF PROSTATE (N/A ) CYSTOSCOPY WITH LITHOLAPAXY (N/A )  Patient Location: PACU  Anesthesia Type:General  Level of Consciousness: awake, alert  and oriented  Airway & Oxygen Therapy: Patient Spontanous Breathing and Patient connected to face mask oxygen  Post-op Assessment: Report given to RN and Post -op Vital signs reviewed and stable  Post vital signs: Reviewed and stable  Last Vitals:  Vitals Value Taken Time  BP 105/73 09/17/19 1143  Temp    Pulse 79 09/17/19 1143  Resp 10 09/17/19 1143  SpO2 100 % 09/17/19 1143  Vitals shown include unvalidated device data.  Last Pain:  Vitals:   09/17/19 0826  TempSrc: Temporal  PainSc: 0-No pain         Complications: No complications documented.

## 2019-09-17 NOTE — Anesthesia Preprocedure Evaluation (Addendum)
Anesthesia Evaluation  Patient identified by MRN, date of birth, ID band Patient awake    Reviewed: Allergy & Precautions, H&P , NPO status , reviewed documented beta blocker date and time   Airway Mallampati: II  TM Distance: >3 FB Neck ROM: limited  Mouth opening: Limited Mouth Opening  Dental  (+) Teeth Intact   Pulmonary    Pulmonary exam normal        Cardiovascular hypertension, + CAD  + pacemaker  Rhythm:irregular  09/15/18 ECHO  NORMAL LEFT VENTRICULAR SYSTOLIC FUNCTION  NORMAL RIGHT VENTRICULAR SYSTOLIC FUNCTION  MILD VALVULAR REGURGITATION (See below)           Aortic: MILD AR          No AS             90.9 cm/sec peak vel    3.3 mmHg peak grad         Mitral: MILD MR          No MS             MV Inflow E Vel = 91.3 cm/sec    MV Annulus E'Vel = 10.8 cm/sec             E/E'Ratio = 8.5       Tricuspid: MILD TR         No TS             221.3 cm/sec peak TR vel  22.6 mmHg peak RV pressure       Pulmonary: No PR           No PS             95.0 cm/sec peak vel    3.6 mmHg peak grad   NO VALVULAR STENOSIS  MILD MR, TR, AR  EF 50%  ___________ Pacemaker check 06/2019   Neuro/Psych    GI/Hepatic neg GERD  ,  Endo/Other    Renal/GU      Musculoskeletal   Abdominal   Peds  Hematology   Anesthesia Other Findings Past Medical History: No date: AF (atrial fibrillation) (HCC) No date: B12 deficiency No date: Coronary artery disease 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) No date: Dysphagia No date: Hypertension No date: Presence of permanent cardiac pacemaker     Comment:  2006 No date: Primary Parkinsonism ([place]) 2014: Prostate cancer [facility]) Past Surgical History: No date: APPENDECTOMY No date: CARDIAC CATHETERIZATION 2008, 2014: COLONOSCOPY 10/13/2015:  ESOPHAGOGASTRODUODENOSCOPY (EGD) WITH PROPOFOL; N/A     Comment:  Procedure: ESOPHAGOGASTRODUODENOSCOPY (EGD) WITH               PROPOFOL;  Surgeon: [name], MD;  Location:               [facility] ENDOSCOPY;  Service: Endoscopy;  Laterality: N/A; 01/30/2019: FASCIOTOMY; Left     Comment:  Procedure: I&D BELOW FASCIA FOOT SINGLE LEFT;  Surgeon:               Samara Deist, DPM;  Location: [facility] ORS;  Service:               Podiatry;  Laterality: Left; 2006: PACEMAKER PLACEMENT BMI    Body Mass Index: 21.23 kg/m     Reproductive/Obstetrics                            Anesthesia Physical Anesthesia Plan  ASA: III  Anesthesia Plan: General   Post-op  Pain Management:    Induction: Intravenous  PONV Risk Score and Plan: Ondansetron and Treatment may vary due to age or medical condition  Airway Management Planned: Oral ETT  Additional Equipment:   Intra-op Plan:   Post-operative Plan: Extubation in OR  Informed Consent: I have reviewed the patients History and Physical, chart, labs and discussed the procedure including the risks, benefits and alternatives for the proposed anesthesia with the patient or authorized representative who has indicated his/her understanding and acceptance.     Dental Advisory Given  Plan Discussed with: CRNA  Anesthesia Plan Comments:         Anesthesia Quick Evaluation

## 2019-09-17 NOTE — Discharge Instructions (Signed)
Daniel [name] Laser Prostate Treatment  Green light laser therapy is a procedure that uses a high-energy laser to get rid of extra prostate tissue by turning the tissue into a vapor. It is less invasive than traditional methods of prostate surgery, which involve cutting out the prostate tissue. Because the tissue is turned into a vapor (vaporized) rather than cut out, there is generally less blood loss. This surgery is used to treat an enlarged prostate gland (benign prostatic hyperplasia). Tell a health care provider about:  Any allergies you have.  All medicines you are taking, including vitamins, herbs, eye drops, creams, and over-the-counter medicines.  Any problems you or family members have had with anesthetic medicines.  Any blood disorders you have.  Any surgeries you have had.  Any medical conditions you have. What are the risks? Generally, this is a safe procedure. However, problems may occur, including:  Infection.  Bleeding.  Allergic reaction to medicines.  Damage to other structures or organs.  Blood in the urine (hematuria).  Painful urination.  Urinary tract infection.  Erectile dysfunction (rare).  Dry ejaculation.  Scar tissue in the urinary passage. What happens before the procedure? Staying hydrated Follow instructions from your health care provider about hydration, which may include:  Up to 2 hours before the procedure - you may continue to drink clear liquids, such as water, clear fruit juice, black coffee, and plain tea. Eating and drinking restrictions Follow instructions from your health care provider about eating and drinking, which may include:  8 hours before the procedure - stop eating heavy meals or foods such as meat, fried foods, or fatty foods.  6 hours before the procedure - stop eating light meals or foods, such as toast or cereal.  6 hours before the procedure - stop drinking milk or drinks that contain milk.  2 hours before the  procedure - stop drinking clear liquids. Medicines  Ask your health care provider about: ? Changing or stopping your regular medicines. This is especially important if you are taking diabetes medicines or blood thinners. ? Taking medicines such as aspirin and ibuprofen. These medicines can thin your blood. Do not take these medicines before your procedure if your health care provider instructs you not to.  You may be prescribed antibiotic medicine. If so, take your antibiotic as told by your health care provider. Do not stop taking the antibiotic even if you start to feel better. General instructions  Plan to have someone take you home from the hospital or clinic.  If you will be going home right after the procedure, plan to have someone with you for 24 hours. What happens during the procedure?  To reduce your risk of infection: ? Your health care team will wash or sanitize their hands. ? Your skin will be washed with soap.  An IV will be inserted into one of your veins.  You will be given one or more of the following: ? A medicine to help you relax (sedative). ? A medicine to make you fall asleep (general anesthetic). ? A medicine that is injected into your spine to numb the area below and slightly above the injection site (spinal anesthetic).  A tube containing viewing scopes and instruments (fiber-optic scope) will be passed through the urethra and into the prostate. The opening of the urethra is at the end of the penis.  A thin fiber will be put through the tube and positioned next to the extra prostate tissue.  Pulses of laser light will  come from the end of the fiber and be projected onto the extra tissue. Your blood will absorb the light, become hot, and vaporize the extra prostate tissue.  The heat from the laser beam will seal off the blood vessels, which will lessen bleeding.  The fiber-optic scope will be removed.  A thin tube will be inserted into the urethra to drain  your urine (urinary catheter). The procedure may vary among health care providers and hospitals. What happens after the procedure?  Your blood pressure, heart rate, breathing rate, and blood oxygen level will be monitored until the medicines you were given have worn off.  Depending on factors such as the amount of prostate tissue that was vaporized, the strength of your bladder, and the amount of bleeding expected, your catheter may be removed before you go home.  You may be given elastic support stockings to wear to help prevent blood clots in your legs.  Do not drive for 24 hours if you were given a sedative, or for as long as directed by your health care provider. Summary  Green light laser therapy is a procedure that uses a high-energy laser that turns extra prostate tissue into a vapor.  This procedure is less invasive than traditional methods of prostate surgery.  Follow instructions from your health care provider about eating and drinking before the procedure.  Pulses of laser light will come from the end of a thin fiber and be aimed at the extra prostate tissue. Your blood will absorb the light, become hot, and vaporize the extra tissue. This information is not intended to replace advice given to you by your health care provider. Make sure you discuss any questions you have with your health care provider. Document Revised: 05/01/2018 Document Reviewed: 01/28/2016 Elsevier Patient Education  [place] After This sheet gives you information about how to care for yourself after your procedure. Your health care provider may also give you more specific instructions. If you have problems or questions, contact your health care provider. What can I expect after the procedure? After the procedure, it is common to have:  Swelling and discomfort around your urethra. The opening of the urethra is at the end of the penis.  Blood in  your urine. This should go away after a few days.  Trouble urinating or sudden need to urinate (urgency). These problems should get better over time. You may continue to have a thin tube (catheter) inserted into your urethra to help drain your urine from your bladder for a few days after the procedure. Follow these instructions at home: Medicines  Take over-the-counter and prescription medicines only as told by your health care provider.  If you were prescribed an antibiotic medicine, take it as told by your health care provider. Do not stop taking the antibiotic even if you start to feel better. Bathing  Do not take baths, swim, or use a hot tub until your health care provider approves. Ask your health care provider if you may take showers. You may only be allowed to take sponge baths. Activity   Do not drive for 24 hours if you were given a medicine to help you relax (sedative) during your procedure.  Do not drive or use heavy machinery while taking prescription pain medicine.  Ask your health care provider what activities are safe for you. Most people can return to normal activities within a few days. ? Do not have sex or engage  in sexual activity until your health care provider approves. ? Do not lift anything that is heavier than 10 lb (4.5 kg), or the limit that you are told, until your health care provider says that it is safe. General instructions      If you have a urinary catheter, care for it as told by your health care provider. This may include: ? Washing your hands before and after touching the catheter. ? Emptying your drainage bag when it is ?- full, or emptying it at least 2-3 times a day. ? Keeping the area around the catheter clean and dry. ? Avoiding any bends or breaks in the catheter. ? Keeping air out of the catheter. ? Making sure that the catheter is not placed under water.  Do not use any products that contain nicotine or tobacco, such as cigarettes and  e-cigarettes. If you need help quitting, ask your health care provider.  Drink enough fluid to keep your urine pale yellow.  Keep all follow-up visits as told by your health care provider. This is important. Contact a health care provider if:  You have trouble: ? Having a bowel movement. ? Getting an erection.  You have swelling around your urethra and it gets worse.  You have blood in your urine for more than 2 days after the procedure.  You have pain or burning when you urinate, or other problems that do not go away or cause discomfort.  You have problems with your catheter or your catheter is blocked.  You have a fever.  You have nausea or you vomit.  You have swelling in your legs. Get help right away if:  Your urine has blood clots in it.  Your urine is dark red.  You cannot urinate after your catheter is removed.  You have blood in your stool.  You have severe pain that does not get better with medicine.  You have shortness of breath. Summary  After the procedure, it is common to have swelling and discomfort around your urethra and blood in your urine for a few days.  Some men may have problems urinating after this procedure. These problems should go away after a few days. If you have pain or burning while urinating, contact your health care provider.  If you have a catheter after this procedure, care for it as told by your health care provider.  If you have severe pain, dark red urine, or urine with blood clots, get medical help right away. This information is not intended to replace advice given to you by your health care provider. Make sure you discuss any questions you have with your health care provider. Document Revised: 05/01/2018 Document Reviewed: 07/19/2016 Elsevier Patient Education  [place], Adult An indwelling urinary catheter is a thin tube that is put into your bladder. The tube helps to drain pee  (urine) out of your body. The tube goes in through your urethra. Your urethra is where pee comes out of your body. Your pee will come out through the catheter, then it will go into a bag (drainage bag). Take good care of your catheter so it will work well. How to wear your catheter and bag Supplies needed  Sticky tape (adhesive tape) or a leg strap.  Alcohol wipe or soap and water (if you use tape).  A clean towel (if you use tape).  Large overnight bag.  Smaller bag (leg bag). Wearing your catheter Attach your catheter to your  leg with tape or a leg strap.  Make sure the catheter is not pulled tight.  If a leg strap gets wet, take it off and put on a dry strap.  If you use tape to hold the bag on your leg: 1. Use an alcohol wipe or soap and water to wash your skin where the tape made it sticky before. 2. Use a clean towel to pat-dry that skin. 3. Use new tape to make the bag stay on your leg. Wearing your bags You should have been given a large overnight bag.  You may wear the overnight bag in the day or night.  Always have the overnight bag lower than your bladder.  Do not let the bag touch the floor.  Before you go to sleep, put a clean plastic bag in a wastebasket. Then hang the overnight bag inside the wastebasket. You should also have a smaller leg bag that fits under your clothes.  Always wear the leg bag below your knee.  Do not wear your leg bag at night. How to care for your skin and catheter Supplies needed  A clean washcloth.  Water and mild soap.  A clean towel. Caring for your skin and catheter      Clean the skin around your catheter every day: 1. Wash your hands with soap and water. 2. Wet a clean washcloth in warm water and mild soap. 3. Clean the skin around your urethra.  If you are male:  Gently spread the folds of skin around your vagina (labia).  With the washcloth in your other hand, wipe the inner side of your labia on each side.  Wipe from front to back.  If you are male:  Pull back any skin that covers the end of your penis (foreskin).  With the washcloth in your other hand, wipe your penis in small circles. Start wiping at the tip of your penis, then move away from the catheter.  Move the foreskin back in place, if needed. 4. With your free hand, hold the catheter close to where it goes into your body.  Keep holding the catheter during cleaning so it does not get pulled out. 5. With the washcloth in your other hand, clean the catheter.  Only wipe downward on the catheter.  Do not wipe upward toward your body. Doing this may push germs into your urethra and cause infection. 6. Use a clean towel to pat-dry the catheter and the skin around it. Make sure to wipe off all soap. 7. Wash your hands with soap and water.  Shower every day. Do not take baths.  Do not use cream, ointment, or lotion on the area where the catheter goes into your body, unless your doctor tells you to.  Do not use powders, sprays, or lotions on your genital area.  Check your skin around the catheter every day for signs of infection. Check for: ? Redness, swelling, or pain. ? Fluid or blood. ? Warmth. ? Pus or a bad smell. How to empty the bag Supplies needed  Rubbing alcohol.  Gauze pad or cotton ball.  Tape or a leg strap. Emptying the bag Pour the pee out of your bag when it is ?- full, or at least 2-3 times a day. Do this for your overnight bag and your leg bag. 1. Wash your hands with soap and water. 2. Separate (detach) the bag from your leg. 3. Hold the bag over the toilet or a clean pail. Keep the bag  lower than your hips and bladder. This is so the pee (urine) does not go back into the tube. 4. Open the pour spout. It is at the bottom of the bag. 5. Empty the pee into the toilet or pail. Do not let the pour spout touch any surface. 6. Put rubbing alcohol on a gauze pad or cotton ball. 7. Use the gauze pad or cotton  ball to clean the pour spout. 8. Close the pour spout. 9. Attach the bag to your leg with tape or a leg strap. 10. Wash your hands with soap and water. Follow instructions for cleaning the drainage bag:  From the product maker.  As told by your doctor. How to change the bag Supplies needed  Alcohol wipes.  A clean bag.  Tape or a leg strap. Changing the bag Replace your bag when it starts to leak, smell bad, or look dirty. 1. Wash your hands with soap and water. 2. Separate the dirty bag from your leg. 3. Pinch the catheter with your fingers so that pee does not spill out. 4. Separate the catheter tube from the bag tube where these tubes connect (at the connection valve). Do not let the tubes touch any surface. 5. Clean the end of the catheter tube with an alcohol wipe. Use a different alcohol wipe to clean the end of the bag tube. 6. Connect the catheter tube to the tube of the clean bag. 7. Attach the clean bag to your leg with tape or a leg strap. Do not make the bag tight on your leg. 8. Wash your hands with soap and water. General rules   Never pull on your catheter. Never try to take it out. Doing that can hurt you.  Always wash your hands before and after you touch your catheter or bag. Use a mild, fragrance-free soap. If you do not have soap and water, use hand sanitizer.  Always make sure there are no twists or bends (kinks) in the catheter tube.  Always make sure there are no leaks in the catheter or bag.  Drink enough fluid to keep your pee pale yellow.  Do not take baths, swim, or use a hot tub.  If you are male, wipe from front to back after you poop (have a bowel movement). Contact a doctor if:  Your pee is cloudy.  Your pee smells worse than usual.  Your catheter gets clogged.  Your catheter leaks.  Your bladder feels full. Get help right away if:  You have redness, swelling, or pain where the catheter goes into your body.  You have fluid,  blood, pus, or a bad smell coming from the area where the catheter goes into your body.  Your skin feels warm where the catheter goes into your body.  You have a fever.  You have pain in your: ? Belly (abdomen). ? Legs. ? Lower back. ? Bladder.  You see blood in the catheter.  Your pee is pink or red.  You feel sick to your stomach (nauseous).  You throw up (vomit).  You have chills.  Your pee is not draining into the bag.  Your catheter gets pulled out. Summary  An indwelling urinary catheter is a thin tube that is placed into the bladder to help drain pee (urine) out of the body.  The catheter is placed into the part of the body that drains pee from the bladder (urethra).  Taking good care of your catheter will keep it working properly and  help prevent problems.  Always wash your hands before and after touching your catheter or bag.  Never pull on your catheter or try to take it out. This information is not intended to replace advice given to you by your health care provider. Make sure you discuss any questions you have with your health care provider. Document Revised: 05/02/2018 Document Reviewed: 08/24/2016 Elsevier Patient Education  [place].

## 2019-09-17 NOTE — Op Note (Signed)
Preoperative diagnosis: 1.  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  Bladder calculi (N21,0)  Postoperative diagnosis: Same  Procedure:1.  Photo vaporization of the prostate with greenlight laser (CPT [phone])                                           2.  Litholapaxy of bladder calculi (CPT [phone])       Surgeon: Otelia Limes. Yves Dill MD  Anesthesia: General  Indications:See the history and physical. After informed consent the above procedure(s) were requested     Technique and findings: After adequate general anesthesia been obtained the patient was placed into dorsal lithotomy position and the perineum was prepped and draped in the usual fashion.  The laser scope was coupled the camera and visually advanced into the bladder.  Bladder was thoroughly inspected.  The bladder was heavily trabeculated with cellules and small diverticula.  4 bladder stones were encountered with the largest one measuring 14 mm.  No bladder tumors were identified.  There was trilobar BPH with visual obstruction.  The 1000 m holmium laser fiber was then introduced through the scope and set at power of 0.5 J and frequency of 10 Hz.  The bladder calculi were all fragmented and all pieces evacuated from the bladder with a Toomey syringe.  The laser scope was then removed and the greenlight laser scope coupled to the camera and introduced into the bladder.  Greenlight XPS laser fiber was then passed through the scope and set at power level of 80 W.  The median lobe and bladder neck tissue were then vaporized.  Power was increased to 120 W and remaining obstructive tissue was vaporized from the bladder neck to the verumontanum.  Liters were then controlled with the cautery setting.  The scope was then removed and 10 cc of viscous Xylocaine instilled within the urethra and the bladder.  20 French silicone catheter was placed and irrigated under clear.  A B&O suppository was placed.   Estimated blood loss was 75 cc.  The procedure was then terminated and patient was transferred to the recovery room in stable condition.

## 2019-09-17 NOTE — Anesthesia Postprocedure Evaluation (Signed)
Anesthesia Post Note  Patient: Daniel [name]  Procedure(s) Performed: GREEN LIGHT LASER TURP (TRANSURETHRAL RESECTION OF PROSTATE (N/A ) CYSTOSCOPY WITH LITHOLAPAXY (N/A )  Patient location during evaluation: PACU Anesthesia Type: General Level of consciousness: awake and alert Pain management: pain level controlled Vital Signs Assessment: post-procedure vital signs reviewed and stable Respiratory status: spontaneous breathing, nonlabored ventilation and respiratory function stable Cardiovascular status: blood pressure returned to baseline and stable Postop Assessment: no apparent nausea or vomiting Anesthetic complications: no   No complications documented.   Last Vitals:  Vitals:   09/17/19 1254 09/17/19 1348  BP: 125/84 128/84  Pulse: 70 69  Resp: 16   Temp: (!) 36.2 C   SpO2: 100% 100%    Last Pain:  Vitals:   09/17/19 1254  TempSrc: Temporal  PainSc: 3                  Magnum Lunde Harvie Heck

## 2019-09-17 NOTE — H&P (Signed)
Date of Initial H&P:09/01/19  History reviewed, patient examined, no change in status, stable for surgery.

## 2019-10-20 DIAGNOSIS — M79675 Pain in left toe(s): Secondary | ICD-10-CM | POA: Diagnosis not present

## 2019-10-20 DIAGNOSIS — M79674 Pain in right toe(s): Secondary | ICD-10-CM | POA: Diagnosis not present

## 2019-10-20 DIAGNOSIS — B351 Tinea unguium: Secondary | ICD-10-CM | POA: Diagnosis not present

## 2019-10-20 DIAGNOSIS — L97523 Non-pressure chronic ulcer of other part of left foot with necrosis of muscle: Secondary | ICD-10-CM | POA: Diagnosis not present

## 2019-11-13 DIAGNOSIS — H2513 Age-related nuclear cataract, bilateral: Secondary | ICD-10-CM | POA: Diagnosis not present

## 2019-11-13 DIAGNOSIS — H401122 Primary open-angle glaucoma, left eye, moderate stage: Secondary | ICD-10-CM | POA: Diagnosis not present

## 2019-11-13 DIAGNOSIS — H401111 Primary open-angle glaucoma, right eye, mild stage: Secondary | ICD-10-CM | POA: Diagnosis not present

## 2019-12-10 DIAGNOSIS — I482 Chronic atrial fibrillation, unspecified: Secondary | ICD-10-CM | POA: Diagnosis not present

## 2019-12-10 DIAGNOSIS — Z23 Encounter for immunization: Secondary | ICD-10-CM | POA: Diagnosis not present

## 2019-12-10 DIAGNOSIS — G20 Parkinson's disease: Secondary | ICD-10-CM | POA: Diagnosis not present

## 2019-12-10 DIAGNOSIS — Z0000 Encounter for general adult medical examination without abnormal findings: Secondary | ICD-10-CM | POA: Diagnosis not present

## 2019-12-11 DIAGNOSIS — Z0000 Encounter for general adult medical examination without abnormal findings: Secondary | ICD-10-CM | POA: Diagnosis not present

## 2019-12-11 DIAGNOSIS — Z125 Encounter for screening for malignant neoplasm of prostate: Secondary | ICD-10-CM | POA: Diagnosis not present

## 2019-12-29 DIAGNOSIS — B351 Tinea unguium: Secondary | ICD-10-CM | POA: Diagnosis not present

## 2019-12-29 DIAGNOSIS — M79674 Pain in right toe(s): Secondary | ICD-10-CM | POA: Diagnosis not present

## 2019-12-29 DIAGNOSIS — M79675 Pain in left toe(s): Secondary | ICD-10-CM | POA: Diagnosis not present

## 2019-12-29 DIAGNOSIS — L97523 Non-pressure chronic ulcer of other part of left foot with necrosis of muscle: Secondary | ICD-10-CM | POA: Diagnosis not present

## 2019-12-30 DIAGNOSIS — N401 Enlarged prostate with lower urinary tract symptoms: Secondary | ICD-10-CM | POA: Diagnosis not present

## 2019-12-30 DIAGNOSIS — Z950 Presence of cardiac pacemaker: Secondary | ICD-10-CM | POA: Diagnosis not present

## 2019-12-30 DIAGNOSIS — R972 Elevated prostate specific antigen [PSA]: Secondary | ICD-10-CM | POA: Diagnosis not present

## 2019-12-30 DIAGNOSIS — C61 Malignant neoplasm of prostate: Secondary | ICD-10-CM | POA: Diagnosis not present

## 2019-12-30 DIAGNOSIS — I482 Chronic atrial fibrillation, unspecified: Secondary | ICD-10-CM | POA: Diagnosis not present

## 2019-12-30 DIAGNOSIS — Z23 Encounter for immunization: Secondary | ICD-10-CM | POA: Diagnosis not present

## 2019-12-30 DIAGNOSIS — R0602 Shortness of breath: Secondary | ICD-10-CM | POA: Diagnosis not present

## 2019-12-30 DIAGNOSIS — Z9889 Other specified postprocedural states: Secondary | ICD-10-CM | POA: Diagnosis not present

## 2019-12-30 DIAGNOSIS — G20 Parkinson's disease: Secondary | ICD-10-CM | POA: Diagnosis not present

## 2019-12-30 DIAGNOSIS — D400 Neoplasm of uncertain behavior of prostate: Secondary | ICD-10-CM | POA: Diagnosis not present

## 2020-01-05 DIAGNOSIS — I480 Paroxysmal atrial fibrillation: Secondary | ICD-10-CM | POA: Diagnosis not present

## 2020-01-12 DIAGNOSIS — D649 Anemia, unspecified: Secondary | ICD-10-CM | POA: Diagnosis not present

## 2020-01-13 DIAGNOSIS — R944 Abnormal results of kidney function studies: Secondary | ICD-10-CM | POA: Diagnosis not present

## 2020-02-17 DIAGNOSIS — H401122 Primary open-angle glaucoma, left eye, moderate stage: Secondary | ICD-10-CM | POA: Diagnosis not present

## 2020-02-17 DIAGNOSIS — H2513 Age-related nuclear cataract, bilateral: Secondary | ICD-10-CM | POA: Diagnosis not present

## 2020-02-17 DIAGNOSIS — H401111 Primary open-angle glaucoma, right eye, mild stage: Secondary | ICD-10-CM | POA: Diagnosis not present

## 2020-02-18 DIAGNOSIS — G20 Parkinson's disease: Secondary | ICD-10-CM | POA: Diagnosis not present

## 2020-02-18 DIAGNOSIS — E559 Vitamin D deficiency, unspecified: Secondary | ICD-10-CM | POA: Diagnosis not present

## 2020-02-18 DIAGNOSIS — E538 Deficiency of other specified B group vitamins: Secondary | ICD-10-CM | POA: Diagnosis not present

## 2020-03-01 DIAGNOSIS — L97523 Non-pressure chronic ulcer of other part of left foot with necrosis of muscle: Secondary | ICD-10-CM | POA: Diagnosis not present

## 2020-03-01 DIAGNOSIS — M21622 Bunionette of left foot: Secondary | ICD-10-CM | POA: Diagnosis not present

## 2020-03-08 IMAGING — CT CT MAXILLOFACIAL W/O CM
4 of 6 series · 16 of 47 positions shown, 18 images · non-contrast
Comparison: Head CT dated 12/07/2015.

CLINICAL DATA: Non syncopal fall, facial trauma.

EXAM:
CT HEAD WITHOUT CONTRAST
CT MAXILLOFACIAL WITHOUT CONTRAST
TECHNIQUE: Multidetector CT imaging of the head and maxillofacial structures
were performed using the standard protocol without intravenous
contrast. Multiplanar CT image reconstructions of the maxillofacial
structures were also generated.

[Series 2: head wo · axial · 0.42mm/px · z∈[+493,+593]mm · 6 of 30 slices shown, 8 images]
[im 5/30  brain]
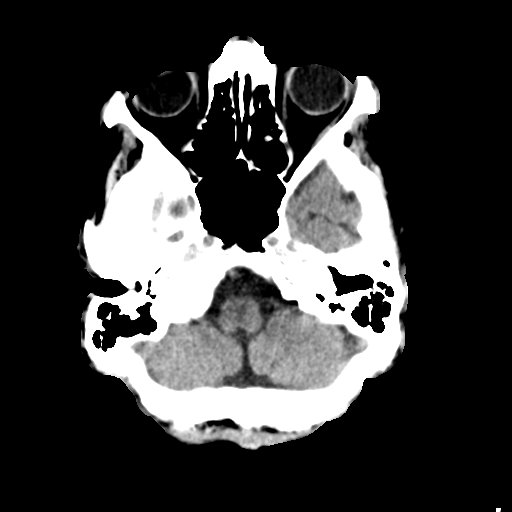
[im 5/30  bone]
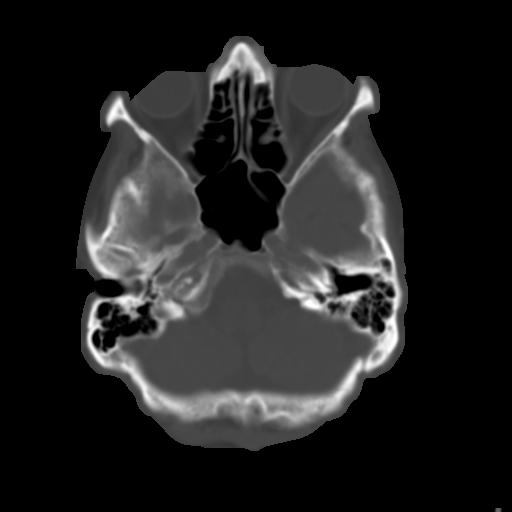
[im 9/30  bone]
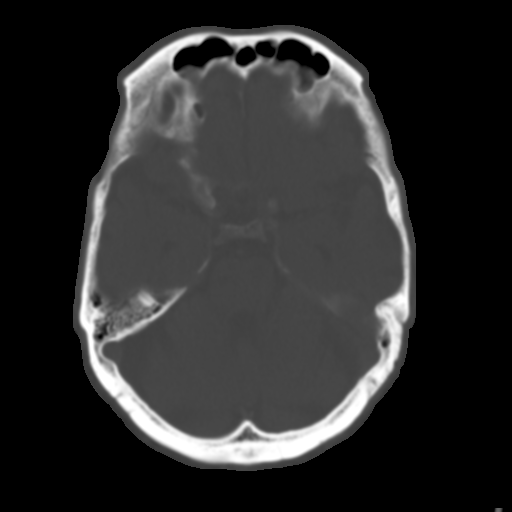
[im 13/30  bone]
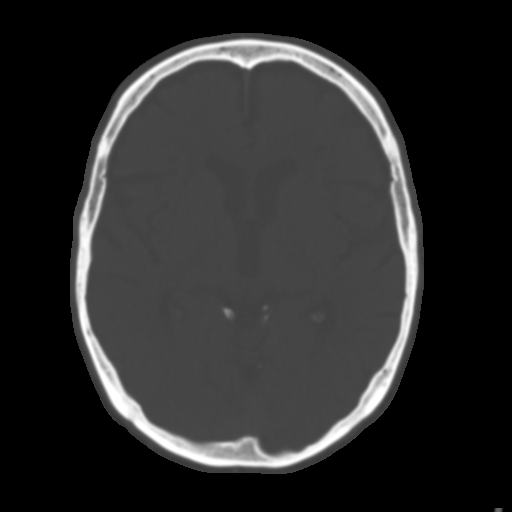
[im 17/30  bone]
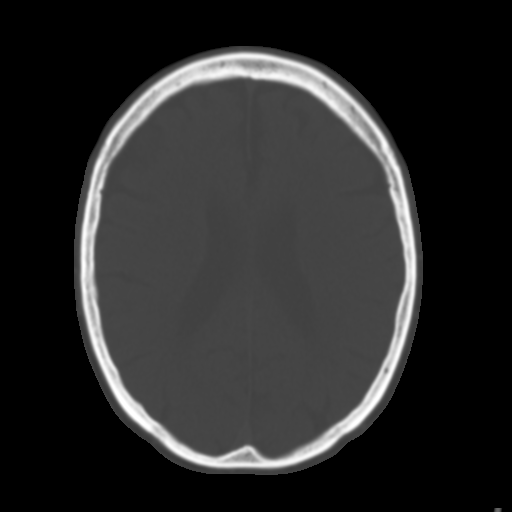
[im 21/30  brain]
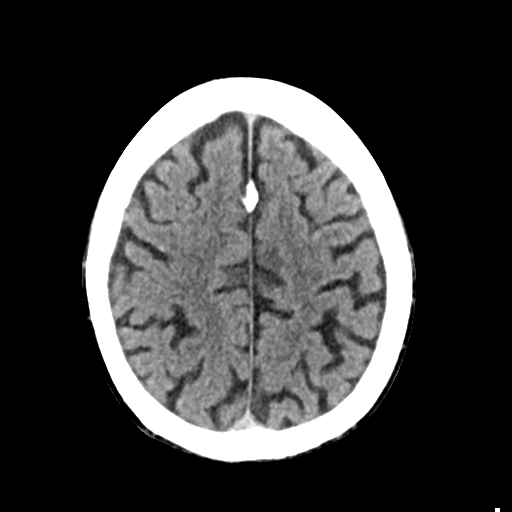
[im 21/30  bone]
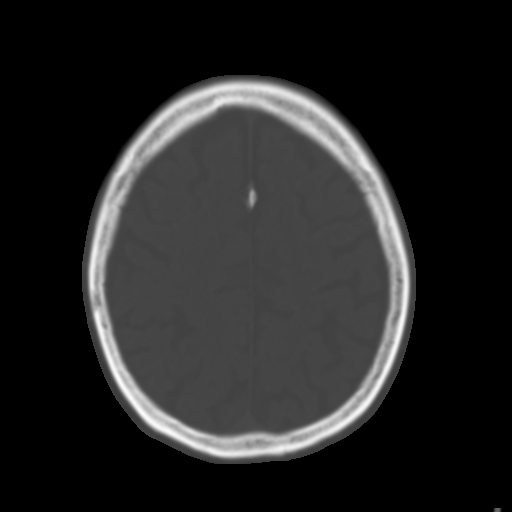
[im 25/30  bone]
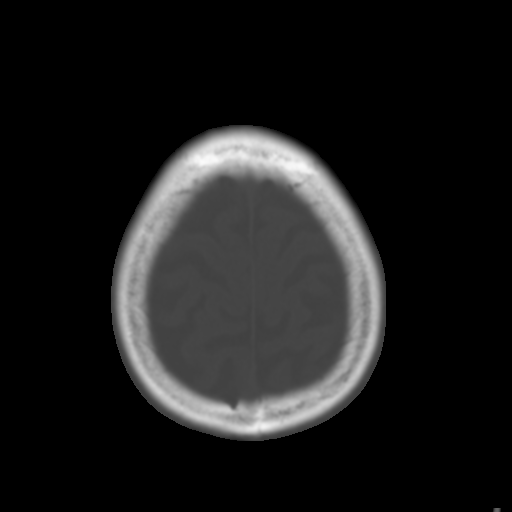

[Series 4: coronal soft tissue · coronal · 0.30mm/px · 3 of 62 slices shown]
[im 12/62  bone]
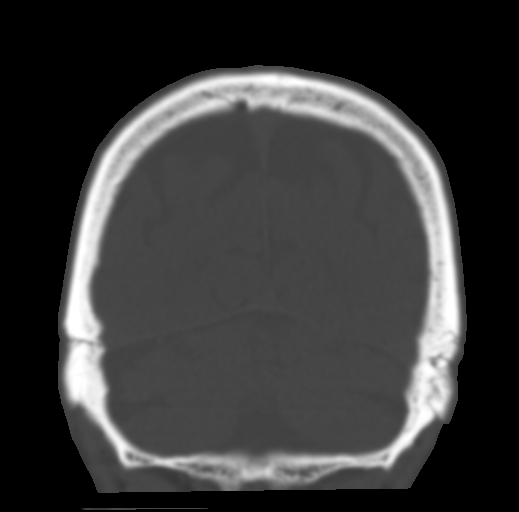
[im 23/62  bone]
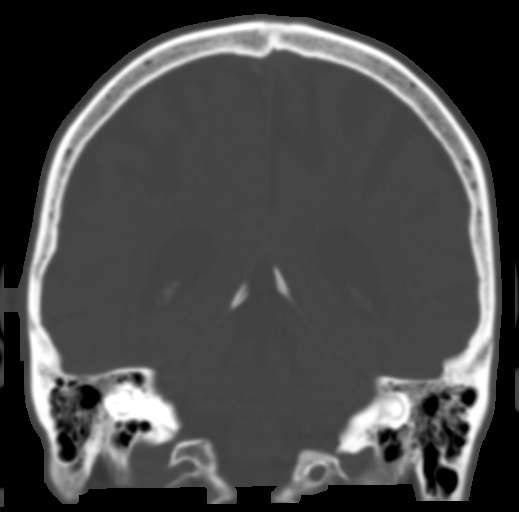
[im 34/62  bone]
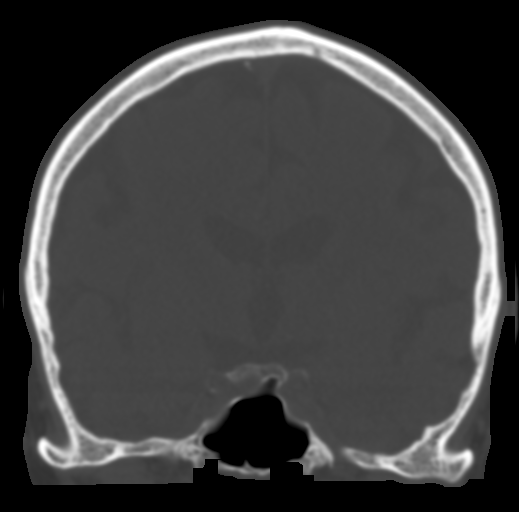

[Series 6: max soft · axial · 0.34mm/px · z∈[+379,+453]mm · 5 of 79 slices shown]
[im 8/79  brain]
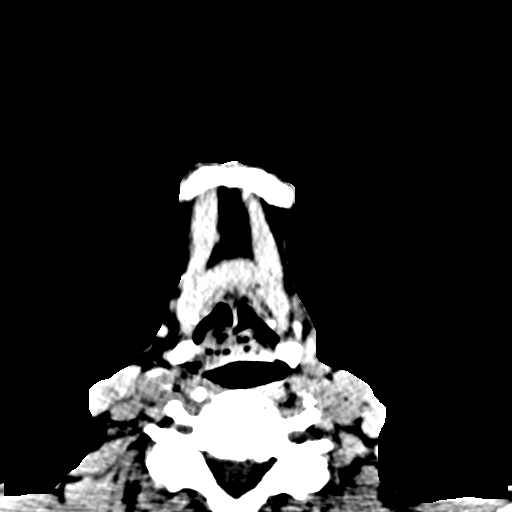
[im 15/79  brain]
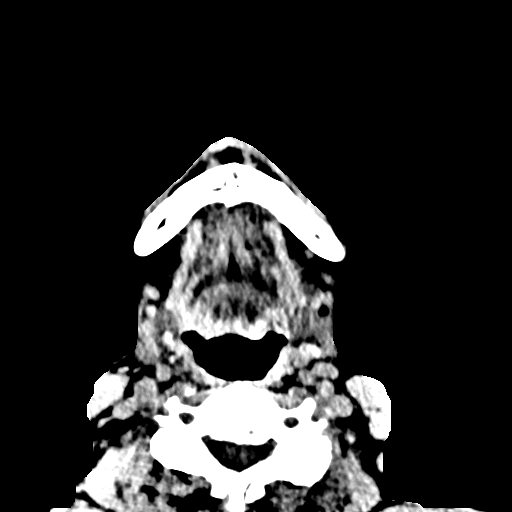
[im 27/79  brain]
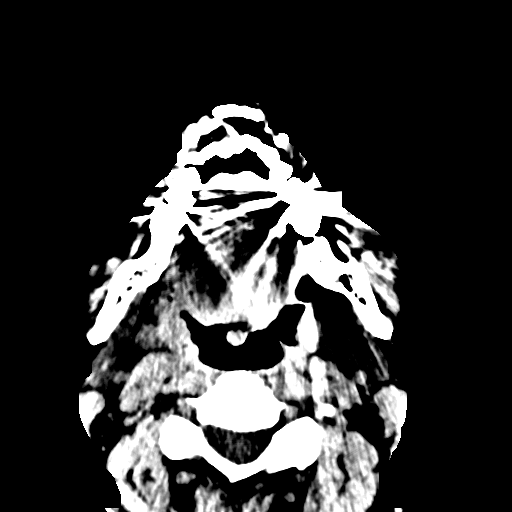
[im 34/79  brain]
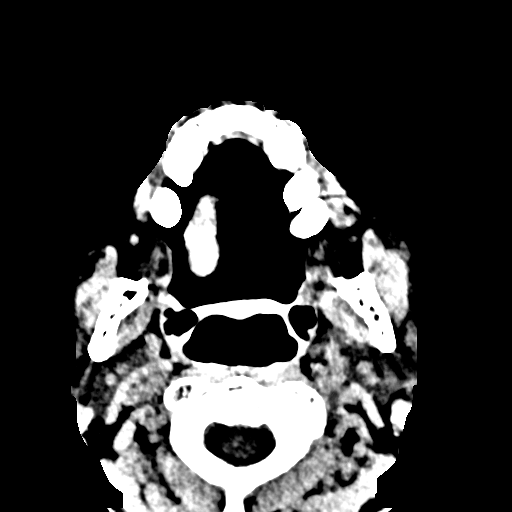
[im 45/79  brain]
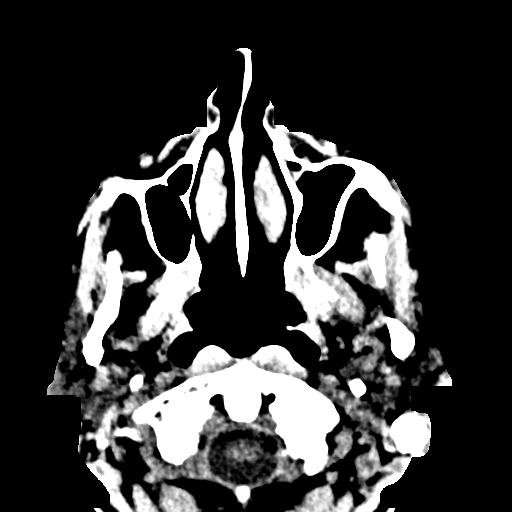

[Series 11: sagittal soft · sagittal · 0.26mm/px · 2 of 83 slices shown]
[im 28/83  bone]
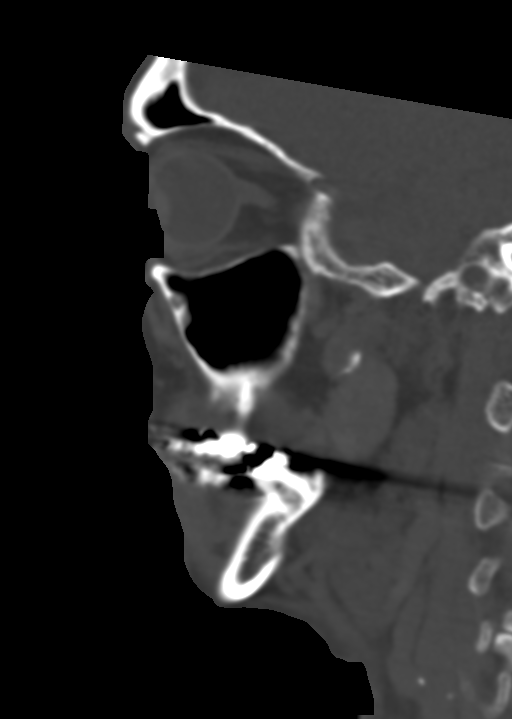
[im 55/83  bone]
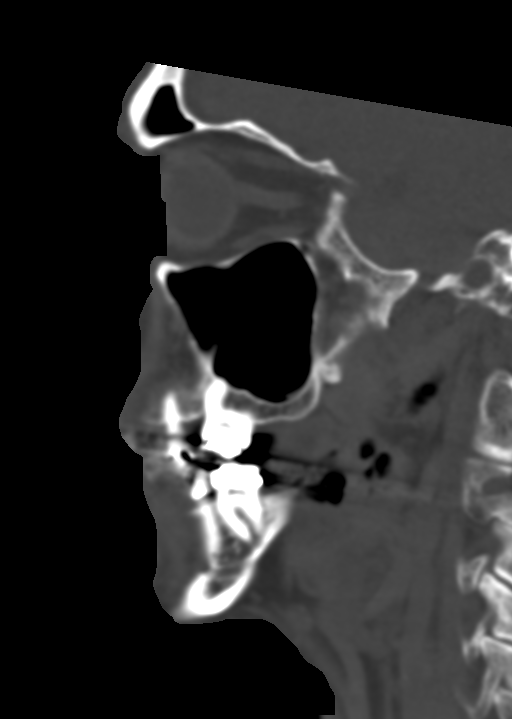

[16 of 47 positions shown; findings below may reference images not displayed]

FINDINGS: CT HEAD FINDINGS

Brain: Ventricles are stable in size and configuration. There is no
mass, hemorrhage, edema or other evidence of acute parenchymal
abnormality. No extra-axial hemorrhage.

Vascular: Chronic calcified atherosclerotic changes of the large
vessels at the skull base. No unexpected hyperdense vessel.

Skull: Normal. Negative for fracture or focal lesion.

Other: None.

CT MAXILLOFACIAL FINDINGS

Osseous: Lower frontal bones are intact. No displaced nasal bone
fracture. Osseous structures about the orbits are intact and
normally aligned bilaterally. Walls of the maxillary sinuses appear
intact and normally aligned bilaterally. Bilateral pterygoid plates
and zygomatic arches are intact. No mandible fracture or
displacement.

Orbits: Negative. No traumatic or inflammatory finding.

Sinuses: Clear.

Soft tissues: Negative.
IMPRESSION: 1. Negative head CT. No intracranial mass, hemorrhage or edema. No
skull fracture.
2. No facial bone fracture or dislocation.

## 2020-03-08 IMAGING — DX DG KNEE COMPLETE 4+V*L*
4 series · 4 of 4 positions shown · non-contrast
Comparison: None.

CLINICAL DATA: Pain after trauma

EXAM:
LEFT KNEE - COMPLETE 4+ VIEW

[knee ap]
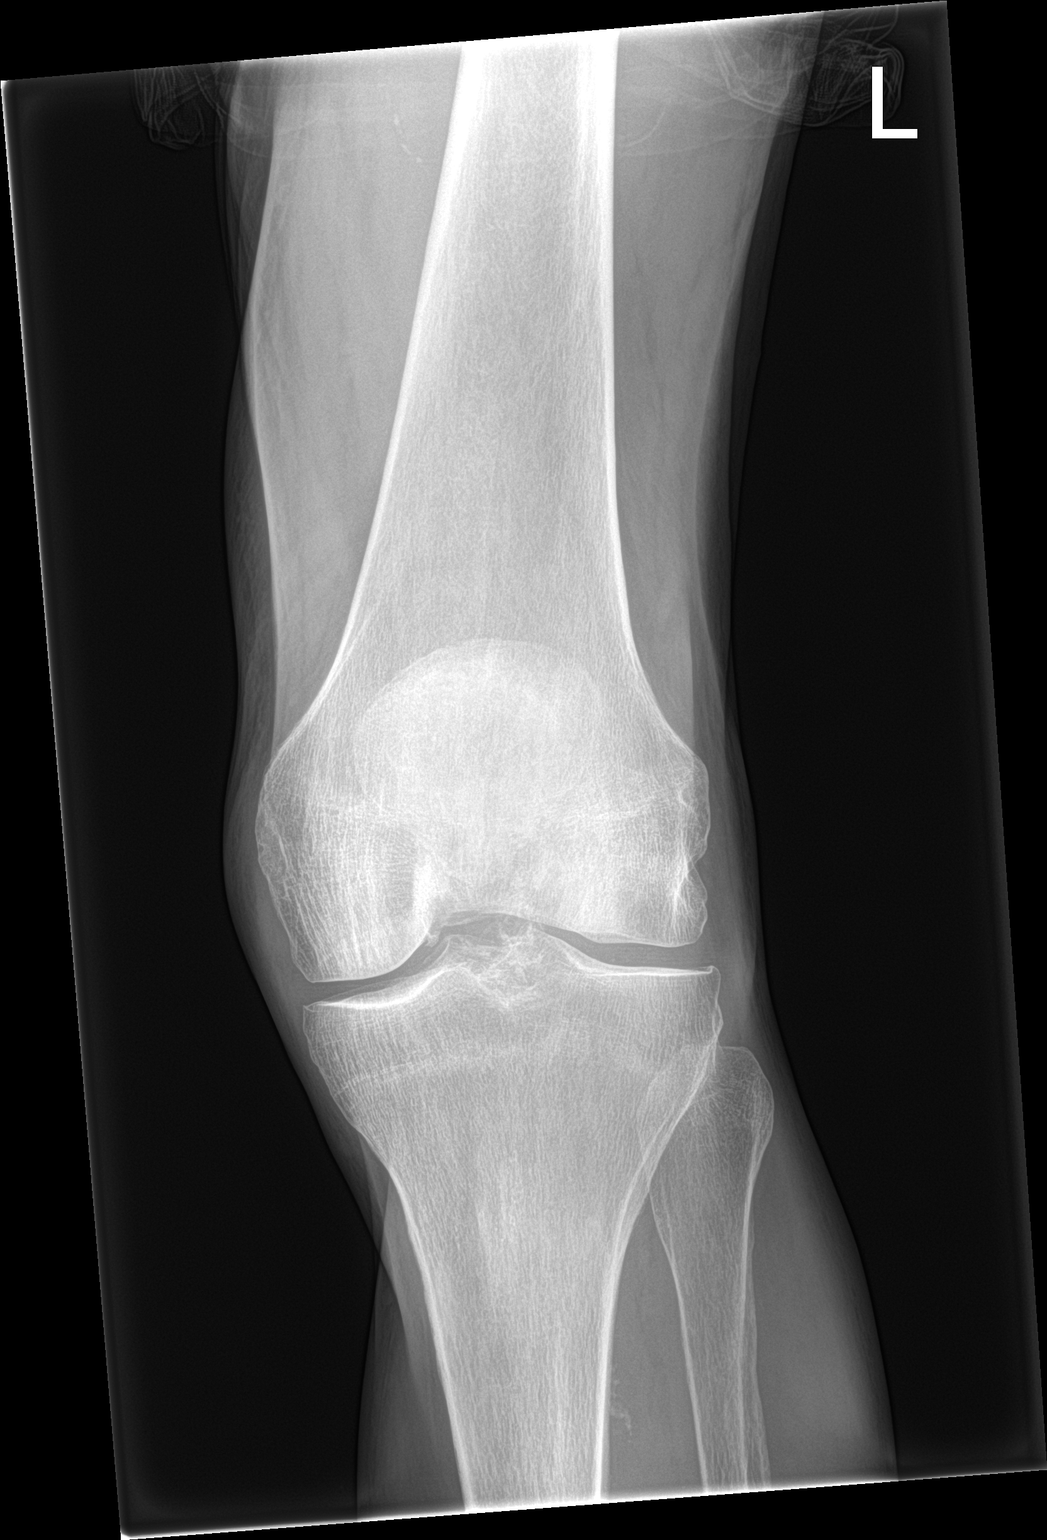

[knee obl (1 of 2)]
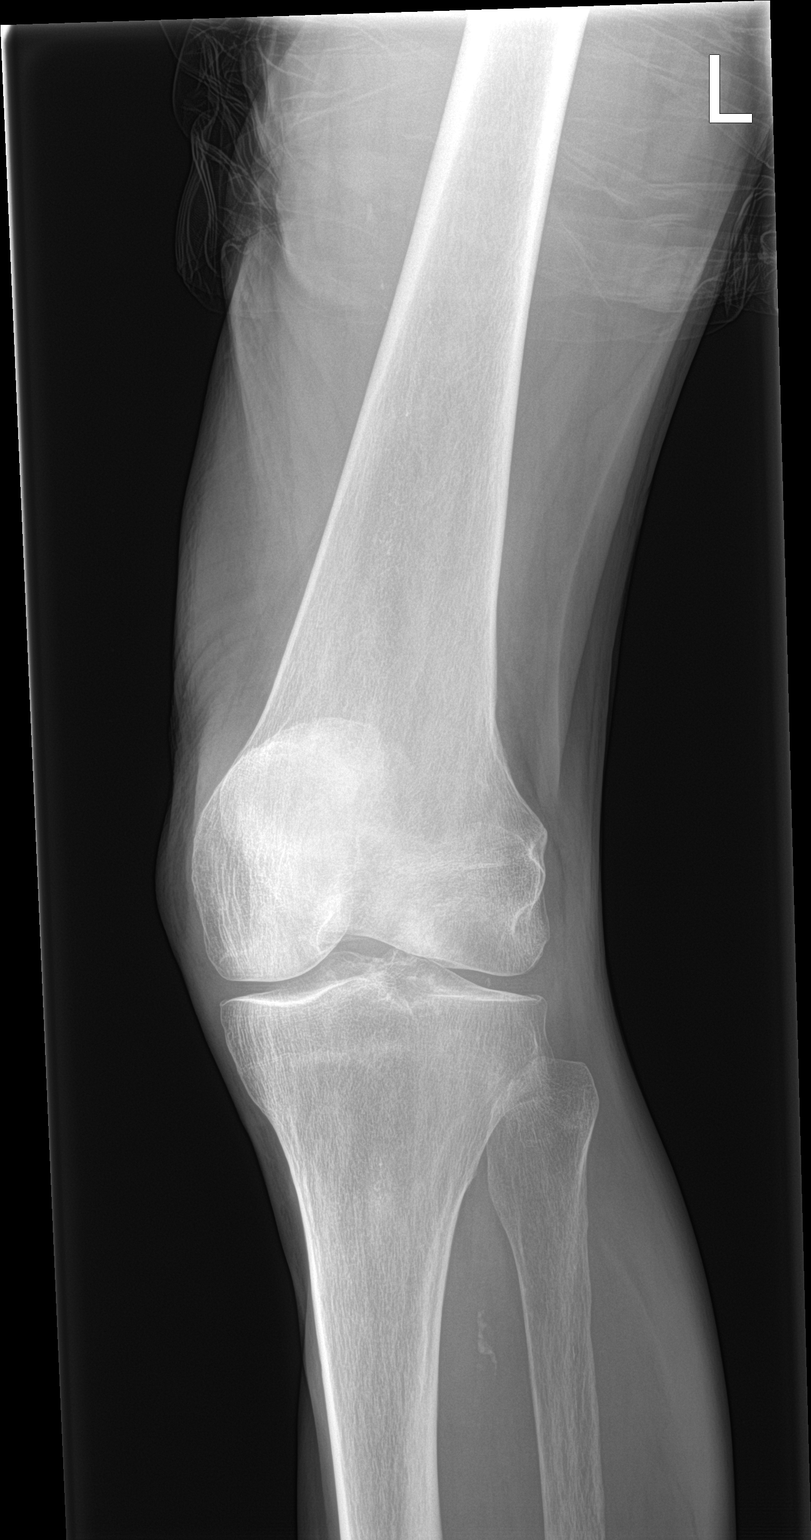

[knee obl (2 of 2)]
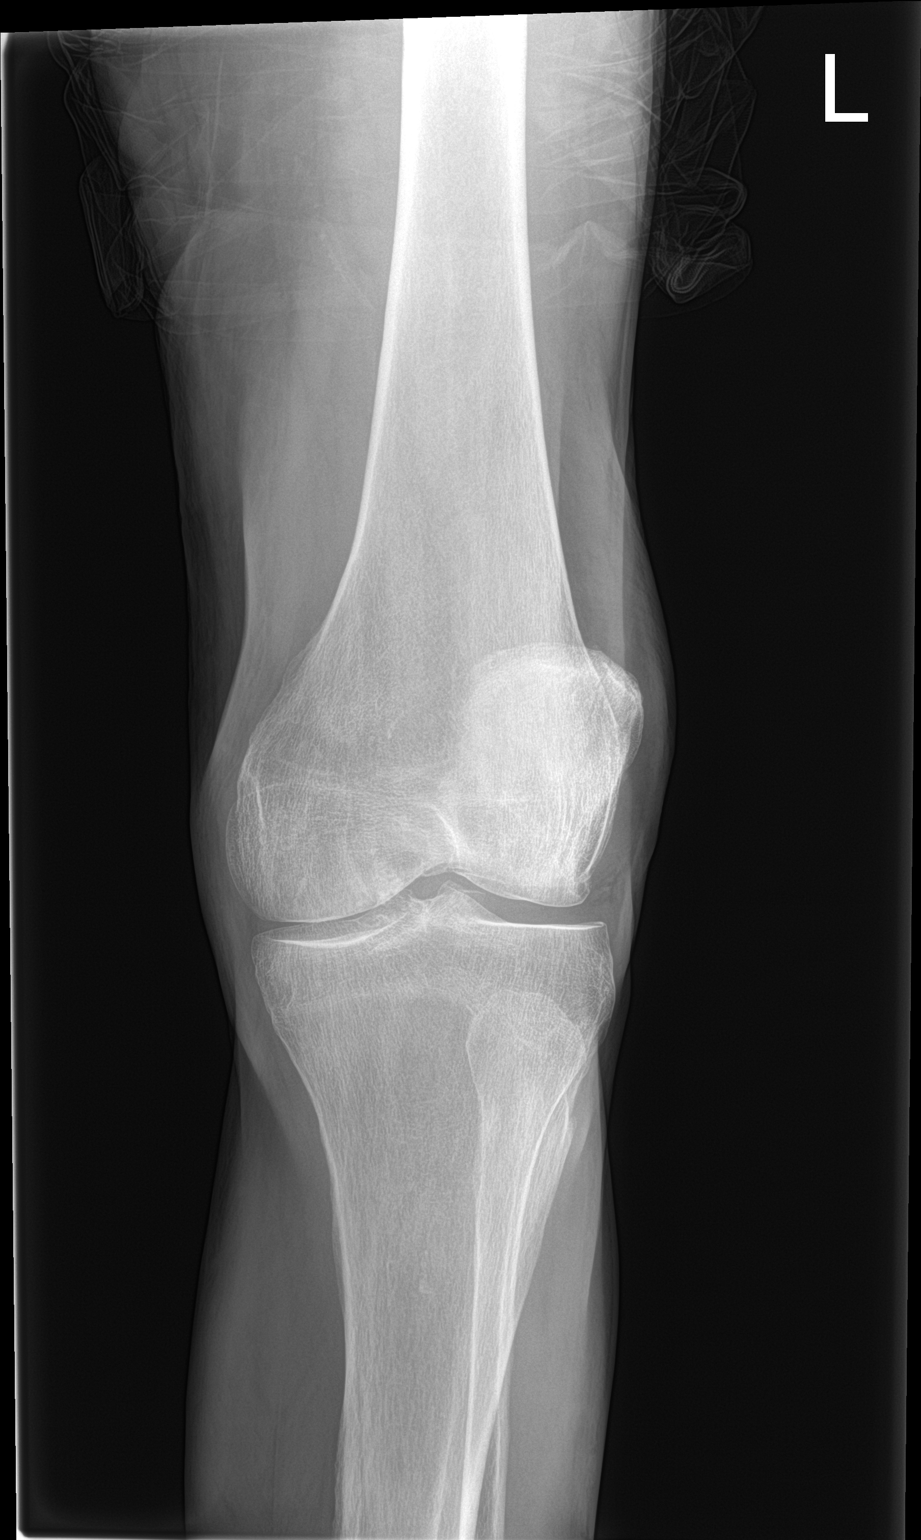

[knee lat]
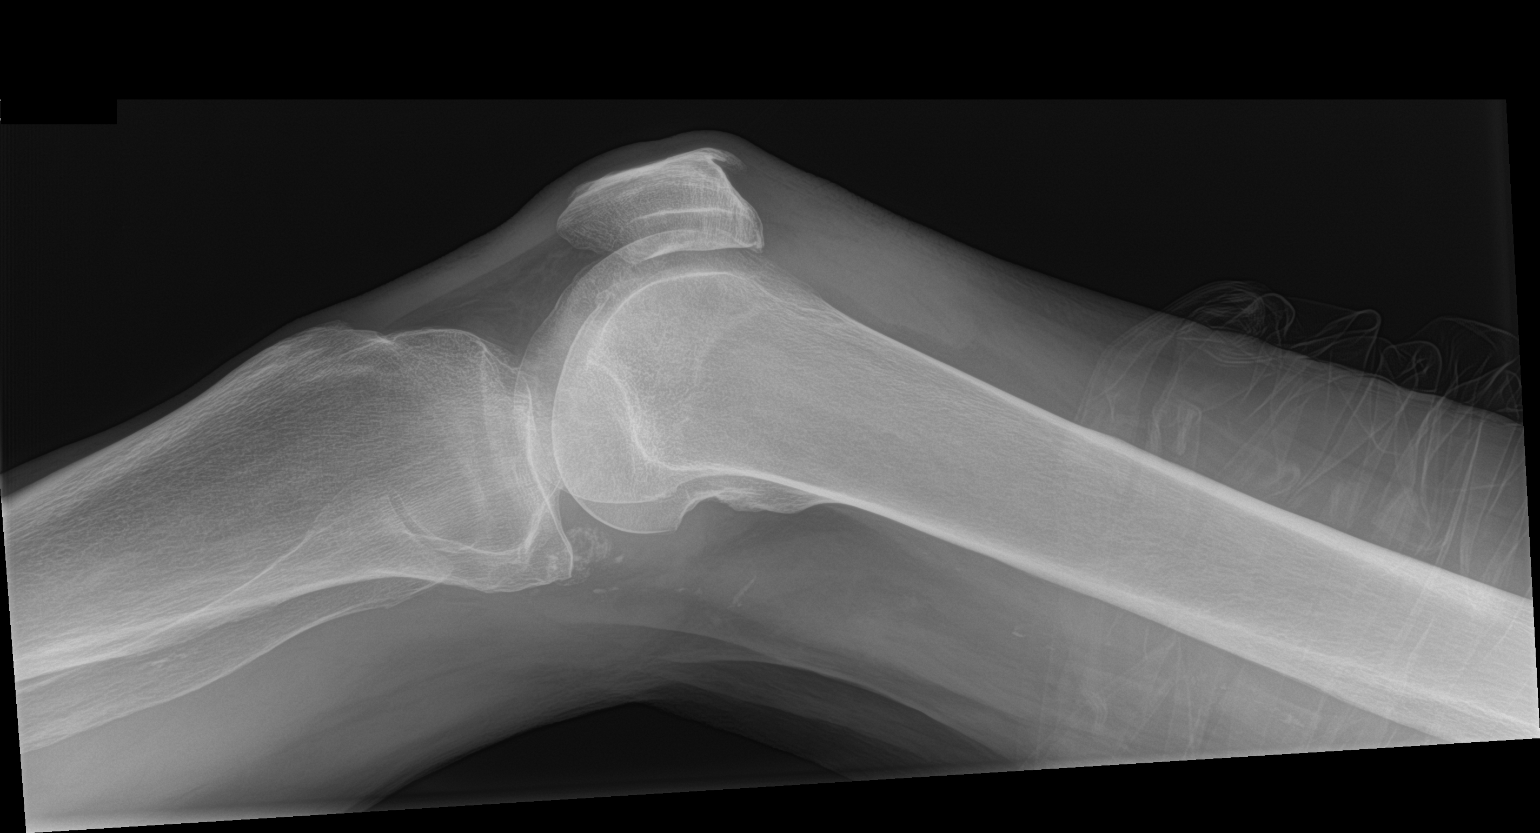

[4 of 4 positions shown; findings below may reference images not displayed]

FINDINGS: Small joint effusion. Enthesopathic changes at the superior aspect
of the patella. Mild degenerative changes in the patella femoral
compartment. There is a calcification posterior to the knee which
could represent a vascular calcification. A popliteal aneurysm is
not excluded. No fracture.
IMPRESSION: 1. Small joint effusion.
2. Eggshell calcification posterior to the knee may be vascular in
origin. A popliteal aneurysm is not excluded. Ultrasound could
better evaluate if clinically warranted. This does not have an acute
appearance.
3. No acute fracture.

## 2020-03-10 DIAGNOSIS — H401111 Primary open-angle glaucoma, right eye, mild stage: Secondary | ICD-10-CM | POA: Diagnosis not present

## 2020-04-21 DIAGNOSIS — H2512 Age-related nuclear cataract, left eye: Secondary | ICD-10-CM | POA: Diagnosis not present

## 2020-04-26 DIAGNOSIS — L97523 Non-pressure chronic ulcer of other part of left foot with necrosis of muscle: Secondary | ICD-10-CM | POA: Diagnosis not present

## 2020-04-26 DIAGNOSIS — M21622 Bunionette of left foot: Secondary | ICD-10-CM | POA: Diagnosis not present

## 2020-04-26 DIAGNOSIS — G20 Parkinson's disease: Secondary | ICD-10-CM | POA: Diagnosis not present

## 2020-04-28 ENCOUNTER — Other Ambulatory Visit: Payer: Self-pay

## 2020-04-28 ENCOUNTER — Encounter: Payer: Self-pay | Admitting: Ophthalmology

## 2020-05-02 ENCOUNTER — Inpatient Hospital Stay: Admission: RE | Admit: 2020-05-02 | Payer: Medicare HMO | Source: Ambulatory Visit

## 2020-05-03 NOTE — Discharge Instructions (Signed)

## 2020-05-04 ENCOUNTER — Encounter: Payer: Self-pay | Admitting: Ophthalmology

## 2020-05-04 ENCOUNTER — Ambulatory Visit: Payer: Medicare HMO | Admitting: Anesthesiology

## 2020-05-04 ENCOUNTER — Encounter
Admission: RE | Disposition: A | Discharge status: Home / Self Care | Payer: Self-pay | Source: Home / Self Care | Attending: Ophthalmology

## 2020-05-04 ENCOUNTER — Other Ambulatory Visit: Payer: Self-pay

## 2020-05-04 ENCOUNTER — Ambulatory Visit
Admission: RE | Admit: 2020-05-04 | Discharge: 2020-05-04 | Disposition: A | Discharge status: Home / Self Care | Payer: Medicare HMO | Attending: Ophthalmology | Admitting: Ophthalmology

## 2020-05-04 DIAGNOSIS — Z881 Allergy status to other antibiotic agents status: Secondary | ICD-10-CM | POA: Diagnosis not present

## 2020-05-04 DIAGNOSIS — H25812 Combined forms of age-related cataract, left eye: Secondary | ICD-10-CM | POA: Diagnosis not present

## 2020-05-04 DIAGNOSIS — Z8546 Personal history of malignant neoplasm of prostate: Secondary | ICD-10-CM | POA: Insufficient documentation

## 2020-05-04 DIAGNOSIS — Z950 Presence of cardiac pacemaker: Secondary | ICD-10-CM | POA: Insufficient documentation

## 2020-05-04 DIAGNOSIS — H2512 Age-related nuclear cataract, left eye: Secondary | ICD-10-CM | POA: Insufficient documentation

## 2020-05-04 DIAGNOSIS — Z79899 Other long term (current) drug therapy: Secondary | ICD-10-CM | POA: Diagnosis not present

## 2020-05-04 DIAGNOSIS — Z7982 Long term (current) use of aspirin: Secondary | ICD-10-CM | POA: Diagnosis not present

## 2020-05-04 DIAGNOSIS — Z885 Allergy status to narcotic agent status: Secondary | ICD-10-CM | POA: Diagnosis not present

## 2020-05-04 HISTORY — PX: CATARACT EXTRACTION W/PHACO: SHX586

## 2020-05-04 SURGERY — PHACOEMULSIFICATION, CATARACT, WITH IOL INSERTION
Anesthesia: Monitor Anesthesia Care | Site: Eye | Laterality: Left

## 2020-05-04 MED ORDER — EPINEPHRINE PF 1 MG/ML IJ SOLN
INTRAOCULAR | Status: DC | PRN
  Administered 2020-05-04: 62 mL via OPHTHALMIC

## 2020-05-04 MED ORDER — ARMC OPHTHALMIC DILATING DROPS
1.0000 "application " | OPHTHALMIC | Status: DC | PRN
  Administered 2020-05-04 (×3): 1 via OPHTHALMIC

## 2020-05-04 MED ORDER — FENTANYL CITRATE (PF) 100 MCG/2ML IJ SOLN
INTRAMUSCULAR | Status: DC | PRN
  Administered 2020-05-04: 50 ug via INTRAVENOUS

## 2020-05-04 MED ORDER — POLYMYXIN B-TRIMETHOPRIM 10000-0.1 UNIT/ML-% OP SOLN
OPHTHALMIC | Status: DC | PRN
  Administered 2020-05-04: 1 [drp] via OPHTHALMIC

## 2020-05-04 MED ORDER — BRIMONIDINE TARTRATE-TIMOLOL 0.2-0.5 % OP SOLN
OPHTHALMIC | Status: DC | PRN
  Administered 2020-05-04: 1 [drp] via OPHTHALMIC

## 2020-05-04 MED ORDER — LIDOCAINE HCL (PF) 2 % IJ SOLN
INTRAOCULAR | Status: DC | PRN
  Administered 2020-05-04: 2 mL

## 2020-05-04 MED ORDER — CEFUROXIME OPHTHALMIC INJECTION 1 MG/0.1 ML
INJECTION | OPHTHALMIC | Status: DC | PRN
  Administered 2020-05-04: 0.1 mL via INTRACAMERAL

## 2020-05-04 MED ORDER — MIDAZOLAM HCL 2 MG/2ML IJ SOLN
INTRAMUSCULAR | Status: DC | PRN
  Administered 2020-05-04: 1 mg via INTRAVENOUS

## 2020-05-04 MED ORDER — TETRACAINE HCL 0.5 % OP SOLN
1.0000 [drp] | OPHTHALMIC | Status: DC | PRN
  Administered 2020-05-04 (×3): 1 [drp] via OPHTHALMIC

## 2020-05-04 MED ORDER — NA HYALUR & NA CHOND-NA HYALUR 0.4-0.35 ML IO KIT
PACK | INTRAOCULAR | Status: DC | PRN
  Administered 2020-05-04: 1 mL via INTRAOCULAR

## 2020-05-04 SURGICAL SUPPLY — 19 items
CANNULA ANT/CHMB 27GA (MISCELLANEOUS) ×2 IMPLANT
GLOVE SURG TRIUMPH 8.0 PF LTX (GLOVE) ×4 IMPLANT
GOWN STRL REUS W/ TWL LRG LVL3 (GOWN DISPOSABLE) ×2 IMPLANT
GOWN STRL REUS W/TWL LRG LVL3 (GOWN DISPOSABLE) ×4
LENS IOL DIOP 15.5 (Intraocular Lens) ×2 IMPLANT
LENS IOL TECNIS MONO 15.5 (Intraocular Lens) ×1 IMPLANT
MARKER SKIN DUAL TIP RULER LAB (MISCELLANEOUS) ×2 IMPLANT
NEEDLE CAPSULORHEX 25GA (NEEDLE) ×2 IMPLANT
NEEDLE FILTER BLUNT 18X 1/2SAF (NEEDLE) ×2
NEEDLE FILTER BLUNT 18X1 1/2 (NEEDLE) ×2 IMPLANT
PACK CATARACT BRASINGTON (MISCELLANEOUS) ×2 IMPLANT
PACK EYE AFTER SURG (MISCELLANEOUS) ×2 IMPLANT
PACK OPTHALMIC (MISCELLANEOUS) ×2 IMPLANT
SOLUTION OPHTHALMIC SALT (MISCELLANEOUS) ×2 IMPLANT
SYR 3ML LL SCALE MARK (SYRINGE) ×4 IMPLANT
SYR TB 1ML LUER SLIP (SYRINGE) ×2 IMPLANT
WATER STERILE IRR 250ML POUR (IV SOLUTION) ×2 IMPLANT
WICK EYE OCUCEL (MISCELLANEOUS) ×2 IMPLANT
WIPE NON LINTING 3.25X3.25 (MISCELLANEOUS) ×2 IMPLANT

## 2020-05-04 NOTE — Anesthesia Preprocedure Evaluation (Signed)
Anesthesia Evaluation  Patient identified by MRN, date of birth, ID band Patient awake    History of Anesthesia Complications Negative for: history of anesthetic complications  Airway Mallampati: III  TM Distance: >3 FB Neck ROM: Full    Dental no notable dental hx.    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  Denies dementia/memory loss issues from Parkinson's     GI/Hepatic negative GI ROS, Neg liver ROS,   Endo/Other  negative endocrine ROS  Renal/GU negative Renal ROS     Musculoskeletal   Abdominal   Peds  Hematology negative hematology ROS (+)   Anesthesia Other Findings   Reproductive/Obstetrics                             Anesthesia Physical Anesthesia Plan  ASA: III  Anesthesia Plan: MAC   Post-op Pain Management:    Induction: Intravenous  PONV Risk Score and Plan: 1 and Midazolam, TIVA and Treatment may vary due to age or medical condition  Airway Management Planned: Nasal Cannula and Natural Airway  Additional Equipment: None  Intra-op Plan:   Post-operative Plan:   Informed Consent: I have reviewed the patients History and Physical, chart, labs and discussed the procedure including the risks, benefits and alternatives for the proposed anesthesia with the patient or authorized representative who has indicated his/her  understanding and acceptance.       Plan Discussed with: CRNA  Anesthesia Plan Comments:         Anesthesia Quick Evaluation

## 2020-05-04 NOTE — Anesthesia Postprocedure Evaluation (Signed)
Anesthesia Post Note  Patient: Daniel [name]  Procedure(s) Performed: CATARACT EXTRACTION PHACO AND INTRAOCULAR LENS PLACEMENT (IOC) LEFT  6.76 00:59.9 11.3% (Left Eye)     Patient location during evaluation: PACU Anesthesia Type: MAC Level of consciousness: awake and alert Pain management: pain level controlled Vital Signs Assessment: post-procedure vital signs reviewed and stable Respiratory status: spontaneous breathing Cardiovascular status: blood pressure returned to baseline Postop Assessment: no apparent nausea or vomiting, adequate PO intake and no headache Anesthetic complications: no   No complications documented.  Adele Barthel Leah Thornberry

## 2020-05-04 NOTE — Anesthesia Procedure Notes (Signed)
Procedure Name: MAC Performed by: Arie Powell, CRNA Pre-anesthesia Checklist: Patient identified, Emergency Drugs available, Suction available, Timeout performed and Patient being monitored Patient Re-evaluated:Patient Re-evaluated prior to induction Oxygen Delivery Method: Nasal cannula Placement Confirmation: positive ETCO2       

## 2020-05-04 NOTE — Op Note (Signed)
  OPERATIVE NOTE  Daniel [name] [id number] 05/04/2020   PREOPERATIVE DIAGNOSIS:  Nuclear sclerotic cataract left eye. H25.12   POSTOPERATIVE DIAGNOSIS:    Nuclear sclerotic cataract left eye.     PROCEDURE:  Phacoemusification with posterior chamber intraocular lens placement of the left eye  Ultrasound time: Procedure(s): CATARACT EXTRACTION PHACO AND INTRAOCULAR LENS PLACEMENT (IOC) LEFT  6.76 00:59.9 11.3% (Left)  LENS:   Implant Name Type Inv. Item Serial No. Manufacturer Lot No. LRB No. Used Action  LENS IOL DIOP 15.5 - [id number] Intraocular Lens LENS IOL DIOP 15.5 [id number] [id number]   Left 1 Implanted      SURGEON:  Wyonia Hough, MD   ANESTHESIA:  Topical with tetracaine drops and 2% Xylocaine jelly, augmented with 1% preservative-free intracameral lidocaine.    COMPLICATIONS:  None.   DESCRIPTION OF PROCEDURE:  The patient was identified in the holding room and transported to the operating room and placed in the supine position under the operating microscope.  The left eye was identified as the operative eye and it was prepped and draped in the usual sterile ophthalmic fashion.   A 1 millimeter clear-corneal paracentesis was made at the 1:30 position.  0.5 ml of preservative-free 1% lidocaine was injected into the anterior chamber.  The anterior chamber was filled with Viscoat viscoelastic.  A 2.4 millimeter keratome was used to make a near-clear corneal incision at the 10:30 position.  .  A curvilinear capsulorrhexis was made with a cystotome and capsulorrhexis forceps.  Balanced salt solution was used to hydrodissect and hydrodelineate the nucleus.   Phacoemulsification was then used in stop and chop fashion to remove the lens nucleus and epinucleus.  The remaining cortex was then removed using the irrigation and aspiration handpiece. Provisc was then placed into the capsular bag to distend it for lens placement.  A lens was then injected into the capsular bag.   The remaining viscoelastic was aspirated.   Wounds were hydrated with balanced salt solution.  The anterior chamber was inflated to a physiologic pressure with balanced salt solution.  No wound leaks were noted. Cefuroxime 0.1 ml of a 10mg /ml solution was injected into the anterior chamber for a dose of 1 mg of intracameral antibiotic at the completion of the case.   Timolol and Brimonidine drops were applied to the eye.  The patient was taken to the recovery room in stable condition without complications of anesthesia or surgery.  Lakendria Nicastro 05/04/2020, 1:55 PM

## 2020-05-04 NOTE — Transfer of Care (Signed)
Immediate Anesthesia Transfer of Care Note  Patient: Daniel [name]  Procedure(s) Performed: CATARACT EXTRACTION PHACO AND INTRAOCULAR LENS PLACEMENT (IOC) LEFT  6.76 00:59.9 11.3% (Left Eye)  Patient Location: PACU  Anesthesia Type: MAC  Level of Consciousness: awake, alert  and patient cooperative  Airway and Oxygen Therapy: Patient Spontanous Breathing and Patient connected to supplemental oxygen  Post-op Assessment: Post-op Vital signs reviewed, Patient's Cardiovascular Status Stable, Respiratory Function Stable, Patent Airway and No signs of Nausea or vomiting  Post-op Vital Signs: Reviewed and stable  Complications: No complications documented.

## 2020-05-04 NOTE — H&P (Signed)
[facility]   Primary Care Physician:  [place]land Pink, MD Ophthalmologist: Dr. Leandrew Koyanagi  Pre-Procedure History & Physical: HPI:  Daniel [name] is a 80 y.o. male here for ophthalmic surgery.   Past Medical History:  Diagnosis Date  . AF (atrial fibrillation) ([place])   . B12 deficiency   . Coronary artery disease   . DH (dermatitis herpetiformis)   . Dysphagia   . Hypertension   . Presence of permanent cardiac pacemaker    2006  . Primary parkinsonism ([place])   . Prostate cancer ([place]) 2014    Past Surgical History:  Procedure Laterality Date  . APPENDECTOMY    . CARDIAC CATHETERIZATION    . COLONOSCOPY  2008, 2014  . CYSTOSCOPY WITH LITHOLAPAXY N/A 09/17/2019   Procedure: CYSTOSCOPY WITH LITHOLAPAXY;  Surgeon: Royston Cowper, MD;  Location: [facility] ORS;  Service: Urology;  Laterality: N/A;  . ESOPHAGOGASTRODUODENOSCOPY (EGD) WITH PROPOFOL N/A 10/13/2015   Procedure: ESOPHAGOGASTRODUODENOSCOPY (EGD) WITH PROPOFOL;  Surgeon: [name], MD;  Location: [facility] ENDOSCOPY;  Service: Endoscopy;  Laterality: N/A;  . FASCIOTOMY Left 01/30/2019   Procedure: I&D BELOW FASCIA FOOT SINGLE LEFT;  Surgeon: Samara Deist, DPM;  Location: [facility] ORS;  Service: Podiatry;  Laterality: Left;  . GREEN LIGHT LASER TURP (TRANSURETHRAL RESECTION OF PROSTATE N/A 09/17/2019   Procedure: GREEN LIGHT LASER TURP (TRANSURETHRAL RESECTION OF PROSTATE;  Surgeon: Royston Cowper, MD;  Location: [facility] ORS;  Service: Urology;  Laterality: N/A;  . PACEMAKER PLACEMENT  2006    Prior to Admission medications   Medication Sig Start Date End Date Taking? Authorizing Provider  aspirin EC 81 MG tablet Take 81 mg by mouth at bedtime.   Yes [provider]  carbidopa-levodopa (SINEMET CR) 50-200 MG tablet Take 1 tablet by mouth at bedtime. 08/07/19  Yes [provider]  carbidopa-levodopa (SINEMET IR) 25-100 MG tablet Take 2 tablets by mouth 3 (three) times daily.  01/07/19  Yes [provider]  cholecalciferol (VITAMIN D) 25 MCG (1000 UT) tablet Take 1,000 Units by mouth daily.   Yes [provider]  cyanocobalamin 1000 MCG tablet Take 1,000 mcg by mouth daily.   Yes [provider]  latanoprost (XALATAN) 0.005 % ophthalmic solution Place 1 drop into both eyes at bedtime.    Yes [provider]  metoprolol succinate (TOPROL-XL) 25 MG 24 hr tablet Take 25 mg by mouth every evening.    Yes [provider]    Allergies as of 03/15/2020 - Review Complete 09/17/2019  Allergen Reaction Noted  . Clindamycin/lincomycin Hives 01/27/2019  . Doxycycline Rash 01/27/2019  . Hydrocodone-acetaminophen Rash 09/02/2019  . Levaquin [levofloxacin] Rash 01/27/2019    Family History  Problem Relation Age of Onset  . Heart attack Mother   . Heart attack Father     Social History   Socioeconomic History  . Marital status: Married    Spouse name: Not on file  . Number of children: Not on file  . Years of education: Not on file  . Highest education level: Not on file  Occupational History  . Not on file  Tobacco Use  . Smoking status: Never Smoker  . Smokeless tobacco: Never Used  Vaping Use  . Vaping Use: Never used  Substance and Sexual Activity  . Alcohol use: Yes    Comment: RARELY  . Drug use: No  . Sexual activity: Not on file  Other Topics Concern  . Not on file  Social History Narrative  .  Not on file   Social Determinants of Health   Financial Resource Strain: Not on file  Food Insecurity: Not on file  Transportation Needs: Not on file  Physical Activity: Not on file  Stress: Not on file  Social Connections: Not on file  Intimate Partner Violence: Not on file    Review of Systems: See HPI, otherwise negative ROS  Physical Exam: BP (!) 144/81   Pulse 77   Temp 97.8 F (36.6 C) (Temporal)   Ht 6\' 2"  (1.88 m)   Wt 75.1 kg   SpO2 100%   BMI 21.26 kg/m  General:   Alert,  pleasant and cooperative in  NAD Head:  Normocephalic and atraumatic. Lungs:  Clear to auscultation.    Heart:  Regular rate and rhythm.   Impression/Plan: Daniel [name] is here for ophthalmic surgery.  Risks, benefits, limitations, and alternatives regarding ophthalmic surgery have been reviewed with the patient.  Questions have been answered.  All parties agreeable.   Leandrew Koyanagi, MD  05/04/2020, 12:28 PM

## 2020-05-05 ENCOUNTER — Encounter: Payer: Self-pay | Admitting: Ophthalmology

## 2020-05-10 ENCOUNTER — Encounter: Payer: Self-pay | Admitting: Ophthalmology

## 2020-05-10 ENCOUNTER — Other Ambulatory Visit: Payer: Self-pay

## 2020-05-10 DIAGNOSIS — H2511 Age-related nuclear cataract, right eye: Secondary | ICD-10-CM | POA: Diagnosis not present

## 2020-05-16 NOTE — Discharge Instructions (Signed)

## 2020-05-18 ENCOUNTER — Ambulatory Visit
Admission: RE | Admit: 2020-05-18 | Discharge: 2020-05-18 | Disposition: A | Discharge status: Home / Self Care | Payer: Medicare HMO | Attending: Ophthalmology | Admitting: Ophthalmology

## 2020-05-18 ENCOUNTER — Encounter: Payer: Self-pay | Admitting: Ophthalmology

## 2020-05-18 ENCOUNTER — Ambulatory Visit: Payer: Medicare HMO | Admitting: Anesthesiology

## 2020-05-18 ENCOUNTER — Encounter
Admission: RE | Disposition: A | Discharge status: Home / Self Care | Payer: Self-pay | Source: Home / Self Care | Attending: Ophthalmology

## 2020-05-18 ENCOUNTER — Other Ambulatory Visit: Payer: Self-pay

## 2020-05-18 DIAGNOSIS — Z885 Allergy status to narcotic agent status: Secondary | ICD-10-CM | POA: Diagnosis not present

## 2020-05-18 DIAGNOSIS — I10 Essential (primary) hypertension: Secondary | ICD-10-CM | POA: Insufficient documentation

## 2020-05-18 DIAGNOSIS — Z7982 Long term (current) use of aspirin: Secondary | ICD-10-CM | POA: Insufficient documentation

## 2020-05-18 DIAGNOSIS — H2511 Age-related nuclear cataract, right eye: Secondary | ICD-10-CM | POA: Insufficient documentation

## 2020-05-18 DIAGNOSIS — Z881 Allergy status to other antibiotic agents status: Secondary | ICD-10-CM | POA: Insufficient documentation

## 2020-05-18 DIAGNOSIS — Z8546 Personal history of malignant neoplasm of prostate: Secondary | ICD-10-CM | POA: Diagnosis not present

## 2020-05-18 DIAGNOSIS — Z8249 Family history of ischemic heart disease and other diseases of the circulatory system: Secondary | ICD-10-CM | POA: Insufficient documentation

## 2020-05-18 DIAGNOSIS — I4891 Unspecified atrial fibrillation: Secondary | ICD-10-CM | POA: Diagnosis not present

## 2020-05-18 DIAGNOSIS — Z950 Presence of cardiac pacemaker: Secondary | ICD-10-CM | POA: Diagnosis not present

## 2020-05-18 DIAGNOSIS — G20 Parkinson's disease: Secondary | ICD-10-CM | POA: Insufficient documentation

## 2020-05-18 DIAGNOSIS — I2510 Atherosclerotic heart disease of native coronary artery without angina pectoris: Secondary | ICD-10-CM | POA: Insufficient documentation

## 2020-05-18 DIAGNOSIS — Z79899 Other long term (current) drug therapy: Secondary | ICD-10-CM | POA: Diagnosis not present

## 2020-05-18 DIAGNOSIS — H25811 Combined forms of age-related cataract, right eye: Secondary | ICD-10-CM | POA: Diagnosis not present

## 2020-05-18 HISTORY — PX: CATARACT EXTRACTION W/PHACO: SHX586

## 2020-05-18 SURGERY — PHACOEMULSIFICATION, CATARACT, WITH IOL INSERTION
Anesthesia: Monitor Anesthesia Care | Site: Eye | Laterality: Right

## 2020-05-18 MED ORDER — TETRACAINE HCL 0.5 % OP SOLN
1.0000 [drp] | OPHTHALMIC | Status: DC | PRN
  Administered 2020-05-18 (×3): 1 [drp] via OPHTHALMIC

## 2020-05-18 MED ORDER — FENTANYL CITRATE (PF) 100 MCG/2ML IJ SOLN
INTRAMUSCULAR | Status: DC | PRN
  Administered 2020-05-18: 50 ug via INTRAVENOUS

## 2020-05-18 MED ORDER — NA HYALUR & NA CHOND-NA HYALUR 0.4-0.35 ML IO KIT
PACK | INTRAOCULAR | Status: DC | PRN
  Administered 2020-05-18: 1 mL via INTRAOCULAR

## 2020-05-18 MED ORDER — LIDOCAINE HCL (PF) 2 % IJ SOLN
INTRAOCULAR | Status: DC | PRN
  Administered 2020-05-18: 2 mL

## 2020-05-18 MED ORDER — CEFUROXIME OPHTHALMIC INJECTION 1 MG/0.1 ML
INJECTION | OPHTHALMIC | Status: DC | PRN
  Administered 2020-05-18: 0.1 mL via INTRACAMERAL

## 2020-05-18 MED ORDER — BRIMONIDINE TARTRATE-TIMOLOL 0.2-0.5 % OP SOLN
OPHTHALMIC | Status: DC | PRN
  Administered 2020-05-18: 1 [drp] via OPHTHALMIC

## 2020-05-18 MED ORDER — MIDAZOLAM HCL 2 MG/2ML IJ SOLN
INTRAMUSCULAR | Status: DC | PRN
  Administered 2020-05-18: 1 mg via INTRAVENOUS

## 2020-05-18 MED ORDER — EPINEPHRINE PF 1 MG/ML IJ SOLN
INTRAOCULAR | Status: DC | PRN
  Administered 2020-05-18: 88 mL via OPHTHALMIC

## 2020-05-18 MED ORDER — LACTATED RINGERS IV SOLN: INTRAVENOUS | Status: DC

## 2020-05-18 MED ORDER — ARMC OPHTHALMIC DILATING DROPS
1.0000 "application " | OPHTHALMIC | Status: DC | PRN
  Administered 2020-05-18 (×3): 1 via OPHTHALMIC

## 2020-05-18 MED ORDER — POLYMYXIN B-TRIMETHOPRIM 10000-0.1 UNIT/ML-% OP SOLN
OPHTHALMIC | Status: DC | PRN
  Administered 2020-05-18: 1 [drp] via OPHTHALMIC

## 2020-05-18 SURGICAL SUPPLY — 19 items
CANNULA ANT/CHMB 27GA (MISCELLANEOUS) ×2 IMPLANT
GLOVE SURG ENC TEXT LTX SZ7.5 (GLOVE) ×2 IMPLANT
GLOVE SURG TRIUMPH 8.0 PF LTX (GLOVE) ×2 IMPLANT
GOWN STRL REUS W/ TWL LRG LVL3 (GOWN DISPOSABLE) ×2 IMPLANT
GOWN STRL REUS W/TWL LRG LVL3 (GOWN DISPOSABLE) ×4
LENS IOL DIOP 15.5 (Intraocular Lens) ×2 IMPLANT
LENS IOL TECNIS MONO 15.5 (Intraocular Lens) ×1 IMPLANT
MARKER SKIN DUAL TIP RULER LAB (MISCELLANEOUS) ×2 IMPLANT
NEEDLE CAPSULORHEX 25GA (NEEDLE) ×2 IMPLANT
NEEDLE FILTER BLUNT 18X 1/2SAF (NEEDLE) ×2
NEEDLE FILTER BLUNT 18X1 1/2 (NEEDLE) ×2 IMPLANT
PACK CATARACT BRASINGTON (MISCELLANEOUS) ×2 IMPLANT
PACK EYE AFTER SURG (MISCELLANEOUS) ×2 IMPLANT
PACK OPTHALMIC (MISCELLANEOUS) ×2 IMPLANT
SOLUTION OPHTHALMIC SALT (MISCELLANEOUS) ×2 IMPLANT
SYR 3ML LL SCALE MARK (SYRINGE) ×4 IMPLANT
SYR TB 1ML LUER SLIP (SYRINGE) ×2 IMPLANT
WATER STERILE IRR 250ML POUR (IV SOLUTION) ×2 IMPLANT
WIPE NON LINTING 3.25X3.25 (MISCELLANEOUS) ×2 IMPLANT

## 2020-05-18 NOTE — Anesthesia Preprocedure Evaluation (Signed)
Anesthesia Evaluation  Patient identified by MRN, date of birth, ID band Patient awake    History of Anesthesia Complications Negative for: history of anesthetic complications  Airway Mallampati: II  TM Distance: >3 FB Neck ROM: Full    Dental no notable dental hx.    Pulmonary neg pulmonary ROS,    Pulmonary exam normal        Cardiovascular Exercise Tolerance: Good hypertension, + dysrhythmias (afib, no AC) + pacemaker   Pacemaker;  ekg: 8/21: Atrial fibrillation with occasional ventricular-paced complexes  Nonspecific T wave abnormalities     echo: 2020:  NORMAL LEFT VENTRICULAR SYSTOLIC FUNCTION  NORMAL RIGHT VENTRICULAR SYSTOLIC FUNCTION  MILD VALVULAR REGURGITATION  NO VALVULAR STENOSIS  MILD MR, TR, AR  EF 50% ;  cards:  Isaias Cowman, MD at 12/30/2019 ;   Neuro/Psych Parkinson's, taken from neurologist note: - dysphagia, progressive hypophonia need to rule out Myasthenia Gravis - but likely from parkinsonism, Decreased eye blinking + dysphagia + tongue tremors + stiffness + bilateral bradykinesia + cogwheeling rigidity bilaterally + decreased arm swing bilateral + 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  Denies dementia/memory loss issues from Parkinson's   Glaucoma; negative psych ROS   GI/Hepatic negative GI ROS, Neg liver ROS,   Endo/Other  negative endocrine ROS  Renal/GU negative Renal ROS     Musculoskeletal   Abdominal   Peds  Hematology negative hematology ROS (+)   Anesthesia Other Findings  had ECCE 2 weeks ago;  Reproductive/Obstetrics                             Anesthesia Physical  Anesthesia Plan  ASA: III  Anesthesia Plan: MAC   Post-op Pain Management:    Induction: Intravenous  PONV Risk Score and Plan: 1 and Midazolam and TIVA  Airway  Management Planned: Nasal Cannula and Natural Airway  Additional Equipment: None  Intra-op Plan:   Post-operative Plan:   Informed Consent: I have reviewed the patients History and Physical, chart, labs and discussed the procedure including the risks, benefits and alternatives for the proposed anesthesia with the patient or authorized representative who has indicated his/her understanding and acceptance.       Plan Discussed with: CRNA  Anesthesia Plan Comments:         Anesthesia Quick Evaluation

## 2020-05-18 NOTE — Transfer of Care (Signed)
Immediate Anesthesia Transfer of Care Note  Patient: Daniel [name]  Procedure(s) Performed: CATARACT EXTRACTION PHACO AND INTRAOCULAR LENS PLACEMENT (IOC) RIGHT 15.56 01:52.6 13.8% (Right Eye)  Patient Location: PACU  Anesthesia Type: MAC  Level of Consciousness: awake, alert  and patient cooperative  Airway and Oxygen Therapy: Patient Spontanous Breathing and Patient connected to supplemental oxygen  Post-op Assessment: Post-op Vital signs reviewed, Patient's Cardiovascular Status Stable, Respiratory Function Stable, Patent Airway and No signs of Nausea or vomiting  Post-op Vital Signs: Reviewed and stable  Complications: No complications documented.

## 2020-05-18 NOTE — Anesthesia Postprocedure Evaluation (Signed)
Anesthesia Post Note  Patient: Daniel [name]  Procedure(s) Performed: CATARACT EXTRACTION PHACO AND INTRAOCULAR LENS PLACEMENT (IOC) RIGHT 15.56 01:52.6 13.8% (Right Eye)     Patient location during evaluation: PACU Anesthesia Type: MAC Level of consciousness: awake and alert Pain management: pain level controlled Vital Signs Assessment: post-procedure vital signs reviewed and stable Respiratory status: spontaneous breathing, nonlabored ventilation, respiratory function stable and patient connected to nasal cannula oxygen Cardiovascular status: stable and blood pressure returned to baseline Postop Assessment: no apparent nausea or vomiting Anesthetic complications: no   No complications documented.  Fidel Levy

## 2020-05-18 NOTE — Anesthesia Procedure Notes (Signed)
Procedure Name: MAC Date/Time: 05/18/2020 12:14 PM Performed by: Jeannene Patella, CRNA Pre-anesthesia Checklist: Patient identified, Emergency Drugs available, Suction available, Timeout performed and Patient being monitored Patient Re-evaluated:Patient Re-evaluated prior to induction Oxygen Delivery Method: Nasal cannula Placement Confirmation: positive ETCO2

## 2020-05-18 NOTE — Op Note (Signed)
LOCATION:  [place]   PREOPERATIVE DIAGNOSIS:    Nuclear sclerotic cataract right eye. H25.11   POSTOPERATIVE DIAGNOSIS:  Nuclear sclerotic cataract right eye.     PROCEDURE:  Phacoemusification with posterior chamber intraocular lens placement of the right eye   ULTRASOUND TIME: Procedure(s): CATARACT EXTRACTION PHACO AND INTRAOCULAR LENS PLACEMENT (IOC) RIGHT 15.56 01:52.6 13.8% (Right)  LENS:   Implant Name Type Inv. Item Serial No. Manufacturer Lot No. LRB No. Used Action  LENS IOL DIOP 15.5 - [id number] Intraocular Lens LENS IOL DIOP 15.5 [id number]   Right 1 Implanted         SURGEON:  Wyonia Hough, MD   ANESTHESIA:  Topical with tetracaine drops and 2% Xylocaine jelly, augmented with 1% preservative-free intracameral lidocaine.    COMPLICATIONS:  None.   DESCRIPTION OF PROCEDURE:  The patient was identified in the holding room and transported to the operating room and placed in the supine position under the operating microscope.  The right eye was identified as the operative eye and it was prepped and draped in the usual sterile ophthalmic fashion.   A 1 millimeter clear-corneal paracentesis was made at the 12:00 position.  0.5 ml of preservative-free 1% lidocaine was injected into the anterior chamber. The anterior chamber was filled with Viscoat viscoelastic.  A 2.4 millimeter keratome was used to make a near-clear corneal incision at the 9:00 position.  A curvilinear capsulorrhexis was made with a cystotome and capsulorrhexis forceps.  Balanced salt solution was used to hydrodissect and hydrodelineate the nucleus.   Phacoemulsification was then used in stop and chop fashion to remove the lens nucleus and epinucleus.  The remaining cortex was then removed using the irrigation and aspiration handpiece. Provisc was then placed into the capsular bag to distend it for lens placement.  A lens was then injected into the capsular bag.  The remaining  viscoelastic was aspirated.   Wounds were hydrated with balanced salt solution.  The anterior chamber was inflated to a physiologic pressure with balanced salt solution.  No wound leaks were noted. Cefuroxime 0.1 ml of a 10mg /ml solution was injected into the anterior chamber for a dose of 1 mg of intracameral antibiotic at the completion of the case.   Timolol and Brimonidine and Polytrim drops were applied to the eye.  The patient was taken to the recovery room in stable condition without complications of anesthesia or surgery.   Daniel [name] 05/18/2020, 12:29 PM

## 2020-05-18 NOTE — H&P (Signed)
[facility]   Primary Care Physician:  Maryland Pink, MD Ophthalmologist: Dr. Leandrew Koyanagi  Pre-Procedure History & Physical: HPI:  Daniel [name] is a 80 y.o. male here for ophthalmic surgery.   Past Medical History:  Diagnosis Date  . AF (atrial fibrillation) ([place])   . B12 deficiency   . Coronary artery disease   . DH (dermatitis herpetiformis)   . Dysphagia   . Hypertension   . Presence of permanent cardiac pacemaker    2006  . Primary parkinsonism ([place])   . Prostate cancer ([place]) 2014    Past Surgical History:  Procedure Laterality Date  . APPENDECTOMY    . CARDIAC CATHETERIZATION    . CATARACT EXTRACTION W/PHACO Left 05/04/2020   Procedure: CATARACT EXTRACTION PHACO AND INTRAOCULAR LENS PLACEMENT (IOC) LEFT  6.76 00:59.9 11.3%;  Surgeon: Leandrew Koyanagi, MD;  Location: [facility];  Service: Ophthalmology;  Laterality: Left;  . COLONOSCOPY  2008, 2014  . CYSTOSCOPY WITH LITHOLAPAXY N/A 09/17/2019   Procedure: CYSTOSCOPY WITH LITHOLAPAXY;  Surgeon: Royston Cowper, MD;  Location: [facility];  Service: Urology;  Laterality: N/A;  . ESOPHAGOGASTRODUODENOSCOPY (EGD) WITH PROPOFOL N/A 10/13/2015   Procedure: ESOPHAGOGASTRODUODENOSCOPY (EGD) WITH PROPOFOL;  Surgeon: [name], MD;  Location: [facility];  Service: Endoscopy;  Laterality: N/A;  . FASCIOTOMY Left 01/30/2019   Procedure: I&D BELOW FASCIA FOOT SINGLE LEFT;  Surgeon: Samara Deist, DPM;  Location: [facility];  Service: Podiatry;  Laterality: Left;  . GREEN LIGHT LASER TURP (TRANSURETHRAL RESECTION OF PROSTATE N/A 09/17/2019   Procedure: GREEN LIGHT LASER TURP (TRANSURETHRAL RESECTION OF PROSTATE;  Surgeon: Royston Cowper, MD;  Location: [facility];  Service: Urology;  Laterality: N/A;  . PACEMAKER PLACEMENT  2006    Prior to Admission medications   Medication Sig Start Date End Date Taking? Authorizing Provider  aspirin EC 81 MG tablet Take 81 mg by mouth at bedtime.   Yes [provider]  carbidopa-levodopa (SINEMET CR) 50-200 MG tablet Take 1 tablet by mouth at bedtime. 08/07/19  Yes [provider]  carbidopa-levodopa (SINEMET IR) 25-100 MG tablet Take 2 tablets by mouth 3 (three) times daily.  01/07/19  Yes [provider]  cholecalciferol (VITAMIN D) 25 MCG (1000 UT) tablet Take 1,000 Units by mouth daily.   Yes [provider]  cyanocobalamin 1000 MCG tablet Take 1,000 mcg by mouth daily.   Yes [provider]  latanoprost (XALATAN) 0.005 % ophthalmic solution Place 1 drop into both eyes at bedtime.    Yes [provider]  metoprolol succinate (TOPROL-XL) 25 MG 24 hr tablet Take 25 mg by mouth every evening.    Yes [provider]    Allergies as of 03/15/2020 - Review Complete 09/17/2019  Allergen Reaction Noted  . Clindamycin/lincomycin Hives 01/27/2019  . Doxycycline Rash 01/27/2019  . Hydrocodone-acetaminophen Rash 09/02/2019  . Levaquin [levofloxacin] Rash 01/27/2019    Family History  Problem Relation Age of Onset  . Heart attack Mother   . Heart attack Father     Social History   Socioeconomic History  . Marital status: Married    Spouse name: Not on file  . Number of children: Not on file  . Years of education: Not on file  . Highest education level: Not on file  Occupational History  . Not on file  Tobacco Use  . Smoking status: Never Smoker  . Smokeless tobacco: Never Used  Vaping Use  . Vaping Use: Never used  Substance and Sexual Activity  . Alcohol use: Yes    Comment: RARELY  . Drug use: No  . Sexual activity: Not on file  Other Topics Concern  . Not on file  Social History Narrative  . Not on file   Social Determinants of Health   Financial Resource Strain: Not on file  Food Insecurity: Not on file  Transportation Needs: Not on file  Physical Activity: Not on file  Stress: Not on file  Social Connections: Not on file  Intimate Partner Violence: Not on file     Review of Systems: See HPI, otherwise negative ROS  Physical Exam: BP (!) 143/73   Pulse 95   Temp (!) 97.4 F (36.3 C) (Temporal)   Ht 6\' 2"  (1.88 m)   Wt 73 kg   SpO2 99%   BMI 20.67 kg/m  General:   Alert,  pleasant and cooperative in NAD Head:  Normocephalic and atraumatic. Lungs:  Clear to auscultation.    Heart:  Regular rate and rhythm.   Impression/Plan: Daniel [name] is here for ophthalmic surgery.  Risks, benefits, limitations, and alternatives regarding ophthalmic surgery have been reviewed with the patient.  Questions have been answered.  All parties agreeable.   Leandrew Koyanagi, MD  05/18/2020, 11:48 AM

## 2020-05-19 ENCOUNTER — Encounter: Payer: Self-pay | Admitting: Ophthalmology

## 2020-06-07 DIAGNOSIS — B351 Tinea unguium: Secondary | ICD-10-CM | POA: Diagnosis not present

## 2020-06-07 DIAGNOSIS — L97521 Non-pressure chronic ulcer of other part of left foot limited to breakdown of skin: Secondary | ICD-10-CM | POA: Diagnosis not present

## 2020-06-07 DIAGNOSIS — M79674 Pain in right toe(s): Secondary | ICD-10-CM | POA: Diagnosis not present

## 2020-06-07 DIAGNOSIS — G20 Parkinson's disease: Secondary | ICD-10-CM | POA: Diagnosis not present

## 2020-06-07 DIAGNOSIS — M79675 Pain in left toe(s): Secondary | ICD-10-CM | POA: Diagnosis not present

## 2020-06-29 DIAGNOSIS — N401 Enlarged prostate with lower urinary tract symptoms: Secondary | ICD-10-CM | POA: Diagnosis not present

## 2020-06-29 DIAGNOSIS — D400 Neoplasm of uncertain behavior of prostate: Secondary | ICD-10-CM | POA: Diagnosis not present

## 2020-06-29 DIAGNOSIS — C61 Malignant neoplasm of prostate: Secondary | ICD-10-CM | POA: Diagnosis not present

## 2020-07-01 DIAGNOSIS — H35373 Puckering of macula, bilateral: Secondary | ICD-10-CM | POA: Diagnosis not present

## 2020-07-01 DIAGNOSIS — Z961 Presence of intraocular lens: Secondary | ICD-10-CM | POA: Diagnosis not present

## 2020-07-06 DIAGNOSIS — I482 Chronic atrial fibrillation, unspecified: Secondary | ICD-10-CM | POA: Diagnosis not present

## 2020-07-06 DIAGNOSIS — Z950 Presence of cardiac pacemaker: Secondary | ICD-10-CM | POA: Diagnosis not present

## 2020-07-06 DIAGNOSIS — R0602 Shortness of breath: Secondary | ICD-10-CM | POA: Diagnosis not present

## 2020-07-19 DIAGNOSIS — L97521 Non-pressure chronic ulcer of other part of left foot limited to breakdown of skin: Secondary | ICD-10-CM | POA: Diagnosis not present

## 2020-07-19 DIAGNOSIS — G20 Parkinson's disease: Secondary | ICD-10-CM | POA: Diagnosis not present

## 2020-07-19 DIAGNOSIS — M21622 Bunionette of left foot: Secondary | ICD-10-CM | POA: Diagnosis not present

## 2020-07-20 DIAGNOSIS — E538 Deficiency of other specified B group vitamins: Secondary | ICD-10-CM | POA: Diagnosis not present

## 2020-07-20 DIAGNOSIS — G20 Parkinson's disease: Secondary | ICD-10-CM | POA: Diagnosis not present

## 2020-07-20 DIAGNOSIS — E559 Vitamin D deficiency, unspecified: Secondary | ICD-10-CM | POA: Diagnosis not present

## 2020-07-20 DIAGNOSIS — R1313 Dysphagia, pharyngeal phase: Secondary | ICD-10-CM | POA: Diagnosis not present

## 2020-08-15 DIAGNOSIS — H401122 Primary open-angle glaucoma, left eye, moderate stage: Secondary | ICD-10-CM | POA: Diagnosis not present

## 2020-08-15 DIAGNOSIS — Z961 Presence of intraocular lens: Secondary | ICD-10-CM | POA: Diagnosis not present

## 2020-08-15 DIAGNOSIS — H401111 Primary open-angle glaucoma, right eye, mild stage: Secondary | ICD-10-CM | POA: Diagnosis not present

## 2020-09-06 DIAGNOSIS — M79675 Pain in left toe(s): Secondary | ICD-10-CM | POA: Diagnosis not present

## 2020-09-06 DIAGNOSIS — B351 Tinea unguium: Secondary | ICD-10-CM | POA: Diagnosis not present

## 2020-09-06 DIAGNOSIS — M21622 Bunionette of left foot: Secondary | ICD-10-CM | POA: Diagnosis not present

## 2020-09-06 DIAGNOSIS — L97521 Non-pressure chronic ulcer of other part of left foot limited to breakdown of skin: Secondary | ICD-10-CM | POA: Diagnosis not present

## 2020-09-06 DIAGNOSIS — M79674 Pain in right toe(s): Secondary | ICD-10-CM | POA: Diagnosis not present

## 2020-10-18 DIAGNOSIS — L97521 Non-pressure chronic ulcer of other part of left foot limited to breakdown of skin: Secondary | ICD-10-CM | POA: Diagnosis not present

## 2020-10-18 DIAGNOSIS — G20 Parkinson's disease: Secondary | ICD-10-CM | POA: Diagnosis not present

## 2020-10-18 DIAGNOSIS — M21622 Bunionette of left foot: Secondary | ICD-10-CM | POA: Diagnosis not present

## 2020-11-08 DIAGNOSIS — I495 Sick sinus syndrome: Secondary | ICD-10-CM | POA: Diagnosis not present

## 2020-11-16 DIAGNOSIS — H401122 Primary open-angle glaucoma, left eye, moderate stage: Secondary | ICD-10-CM | POA: Diagnosis not present

## 2020-11-16 DIAGNOSIS — H35033 Hypertensive retinopathy, bilateral: Secondary | ICD-10-CM | POA: Diagnosis not present

## 2020-11-16 DIAGNOSIS — Z961 Presence of intraocular lens: Secondary | ICD-10-CM | POA: Diagnosis not present

## 2020-11-16 DIAGNOSIS — H401111 Primary open-angle glaucoma, right eye, mild stage: Secondary | ICD-10-CM | POA: Diagnosis not present

## 2020-11-29 DIAGNOSIS — M79675 Pain in left toe(s): Secondary | ICD-10-CM | POA: Diagnosis not present

## 2020-11-29 DIAGNOSIS — B351 Tinea unguium: Secondary | ICD-10-CM | POA: Diagnosis not present

## 2020-11-29 DIAGNOSIS — M79674 Pain in right toe(s): Secondary | ICD-10-CM | POA: Diagnosis not present

## 2020-11-29 DIAGNOSIS — L97521 Non-pressure chronic ulcer of other part of left foot limited to breakdown of skin: Secondary | ICD-10-CM | POA: Diagnosis not present

## 2020-12-06 DIAGNOSIS — E559 Vitamin D deficiency, unspecified: Secondary | ICD-10-CM | POA: Diagnosis not present

## 2020-12-06 DIAGNOSIS — E538 Deficiency of other specified B group vitamins: Secondary | ICD-10-CM | POA: Diagnosis not present

## 2020-12-13 DIAGNOSIS — G20 Parkinson's disease: Secondary | ICD-10-CM | POA: Diagnosis not present

## 2020-12-13 DIAGNOSIS — N1831 Chronic kidney disease, stage 3a: Secondary | ICD-10-CM | POA: Diagnosis not present

## 2020-12-13 DIAGNOSIS — C61 Malignant neoplasm of prostate: Secondary | ICD-10-CM | POA: Diagnosis not present

## 2020-12-13 DIAGNOSIS — E538 Deficiency of other specified B group vitamins: Secondary | ICD-10-CM | POA: Diagnosis not present

## 2020-12-13 DIAGNOSIS — I482 Chronic atrial fibrillation, unspecified: Secondary | ICD-10-CM | POA: Diagnosis not present

## 2020-12-13 DIAGNOSIS — Z125 Encounter for screening for malignant neoplasm of prostate: Secondary | ICD-10-CM | POA: Diagnosis not present

## 2020-12-13 DIAGNOSIS — Z0000 Encounter for general adult medical examination without abnormal findings: Secondary | ICD-10-CM | POA: Diagnosis not present

## 2020-12-20 DIAGNOSIS — Z125 Encounter for screening for malignant neoplasm of prostate: Secondary | ICD-10-CM | POA: Diagnosis not present

## 2020-12-20 DIAGNOSIS — Z0000 Encounter for general adult medical examination without abnormal findings: Secondary | ICD-10-CM | POA: Diagnosis not present

## 2020-12-20 DIAGNOSIS — I482 Chronic atrial fibrillation, unspecified: Secondary | ICD-10-CM | POA: Diagnosis not present

## 2021-01-03 DIAGNOSIS — I495 Sick sinus syndrome: Secondary | ICD-10-CM | POA: Diagnosis not present

## 2021-01-03 DIAGNOSIS — R0602 Shortness of breath: Secondary | ICD-10-CM | POA: Diagnosis not present

## 2021-01-03 DIAGNOSIS — Z23 Encounter for immunization: Secondary | ICD-10-CM | POA: Diagnosis not present

## 2021-01-03 DIAGNOSIS — G20 Parkinson's disease: Secondary | ICD-10-CM | POA: Diagnosis not present

## 2021-01-03 DIAGNOSIS — Z9889 Other specified postprocedural states: Secondary | ICD-10-CM | POA: Diagnosis not present

## 2021-01-03 DIAGNOSIS — I4891 Unspecified atrial fibrillation: Secondary | ICD-10-CM | POA: Diagnosis not present

## 2021-01-03 DIAGNOSIS — Z950 Presence of cardiac pacemaker: Secondary | ICD-10-CM | POA: Diagnosis not present

## 2021-01-04 DIAGNOSIS — C61 Malignant neoplasm of prostate: Secondary | ICD-10-CM | POA: Diagnosis not present

## 2021-01-04 DIAGNOSIS — N401 Enlarged prostate with lower urinary tract symptoms: Secondary | ICD-10-CM | POA: Diagnosis not present

## 2021-01-04 DIAGNOSIS — D400 Neoplasm of uncertain behavior of prostate: Secondary | ICD-10-CM | POA: Diagnosis not present

## 2021-01-18 DIAGNOSIS — Z20822 Contact with and (suspected) exposure to covid-19: Secondary | ICD-10-CM | POA: Diagnosis not present

## 2021-02-14 DIAGNOSIS — M79674 Pain in right toe(s): Secondary | ICD-10-CM | POA: Diagnosis not present

## 2021-02-14 DIAGNOSIS — M79675 Pain in left toe(s): Secondary | ICD-10-CM | POA: Diagnosis not present

## 2021-02-14 DIAGNOSIS — L97521 Non-pressure chronic ulcer of other part of left foot limited to breakdown of skin: Secondary | ICD-10-CM | POA: Diagnosis not present

## 2021-02-14 DIAGNOSIS — B351 Tinea unguium: Secondary | ICD-10-CM | POA: Diagnosis not present

## 2021-02-16 DIAGNOSIS — H401122 Primary open-angle glaucoma, left eye, moderate stage: Secondary | ICD-10-CM | POA: Diagnosis not present

## 2021-02-16 DIAGNOSIS — H35033 Hypertensive retinopathy, bilateral: Secondary | ICD-10-CM | POA: Diagnosis not present

## 2021-02-16 DIAGNOSIS — H401111 Primary open-angle glaucoma, right eye, mild stage: Secondary | ICD-10-CM | POA: Diagnosis not present

## 2021-02-16 DIAGNOSIS — Z961 Presence of intraocular lens: Secondary | ICD-10-CM | POA: Diagnosis not present

## 2021-03-29 DIAGNOSIS — M21372 Foot drop, left foot: Secondary | ICD-10-CM | POA: Diagnosis not present

## 2021-03-29 DIAGNOSIS — G20 Parkinson's disease: Secondary | ICD-10-CM | POA: Diagnosis not present

## 2021-03-29 DIAGNOSIS — E569 Vitamin deficiency, unspecified: Secondary | ICD-10-CM | POA: Diagnosis not present

## 2021-04-11 DIAGNOSIS — L97521 Non-pressure chronic ulcer of other part of left foot limited to breakdown of skin: Secondary | ICD-10-CM | POA: Diagnosis not present

## 2021-04-21 DIAGNOSIS — M21372 Foot drop, left foot: Secondary | ICD-10-CM | POA: Diagnosis not present

## 2021-05-02 DIAGNOSIS — I495 Sick sinus syndrome: Secondary | ICD-10-CM | POA: Diagnosis not present

## 2021-05-25 DIAGNOSIS — H35033 Hypertensive retinopathy, bilateral: Secondary | ICD-10-CM | POA: Diagnosis not present

## 2021-05-25 DIAGNOSIS — H401111 Primary open-angle glaucoma, right eye, mild stage: Secondary | ICD-10-CM | POA: Diagnosis not present

## 2021-05-25 DIAGNOSIS — Z961 Presence of intraocular lens: Secondary | ICD-10-CM | POA: Diagnosis not present

## 2021-05-25 DIAGNOSIS — H401122 Primary open-angle glaucoma, left eye, moderate stage: Secondary | ICD-10-CM | POA: Diagnosis not present

## 2021-06-13 DIAGNOSIS — M21622 Bunionette of left foot: Secondary | ICD-10-CM | POA: Diagnosis not present

## 2021-06-13 DIAGNOSIS — L97521 Non-pressure chronic ulcer of other part of left foot limited to breakdown of skin: Secondary | ICD-10-CM | POA: Diagnosis not present

## 2021-06-13 DIAGNOSIS — G20 Parkinson's disease: Secondary | ICD-10-CM | POA: Diagnosis not present

## 2021-07-04 DIAGNOSIS — Z950 Presence of cardiac pacemaker: Secondary | ICD-10-CM | POA: Diagnosis not present

## 2021-07-04 DIAGNOSIS — I480 Paroxysmal atrial fibrillation: Secondary | ICD-10-CM | POA: Diagnosis not present

## 2021-07-27 DIAGNOSIS — L97521 Non-pressure chronic ulcer of other part of left foot limited to breakdown of skin: Secondary | ICD-10-CM | POA: Diagnosis not present

## 2021-07-27 DIAGNOSIS — M21622 Bunionette of left foot: Secondary | ICD-10-CM | POA: Diagnosis not present

## 2021-07-27 DIAGNOSIS — G20 Parkinson's disease: Secondary | ICD-10-CM | POA: Diagnosis not present

## 2021-08-01 DIAGNOSIS — D400 Neoplasm of uncertain behavior of prostate: Secondary | ICD-10-CM | POA: Diagnosis not present

## 2021-08-01 DIAGNOSIS — C61 Malignant neoplasm of prostate: Secondary | ICD-10-CM | POA: Diagnosis not present

## 2021-10-10 DIAGNOSIS — L97521 Non-pressure chronic ulcer of other part of left foot limited to breakdown of skin: Secondary | ICD-10-CM | POA: Diagnosis not present

## 2021-10-10 DIAGNOSIS — G20 Parkinson's disease: Secondary | ICD-10-CM | POA: Diagnosis not present

## 2021-10-10 DIAGNOSIS — Q6672 Congenital pes cavus, left foot: Secondary | ICD-10-CM | POA: Diagnosis not present

## 2021-10-11 DIAGNOSIS — Z961 Presence of intraocular lens: Secondary | ICD-10-CM | POA: Diagnosis not present

## 2021-10-11 DIAGNOSIS — H35033 Hypertensive retinopathy, bilateral: Secondary | ICD-10-CM | POA: Diagnosis not present

## 2021-10-11 DIAGNOSIS — H401122 Primary open-angle glaucoma, left eye, moderate stage: Secondary | ICD-10-CM | POA: Diagnosis not present

## 2021-10-11 DIAGNOSIS — H401111 Primary open-angle glaucoma, right eye, mild stage: Secondary | ICD-10-CM | POA: Diagnosis not present

## 2021-10-24 DIAGNOSIS — L97521 Non-pressure chronic ulcer of other part of left foot limited to breakdown of skin: Secondary | ICD-10-CM | POA: Diagnosis not present

## 2021-10-24 DIAGNOSIS — G20C Parkinsonism, unspecified: Secondary | ICD-10-CM | POA: Diagnosis not present

## 2021-10-24 DIAGNOSIS — Q6672 Congenital pes cavus, left foot: Secondary | ICD-10-CM | POA: Diagnosis not present

## 2021-10-31 DIAGNOSIS — I495 Sick sinus syndrome: Secondary | ICD-10-CM | POA: Diagnosis not present

## 2021-11-21 DIAGNOSIS — Q6672 Congenital pes cavus, left foot: Secondary | ICD-10-CM | POA: Diagnosis not present

## 2021-11-21 DIAGNOSIS — G20C Parkinsonism, unspecified: Secondary | ICD-10-CM | POA: Diagnosis not present

## 2021-11-21 DIAGNOSIS — L97521 Non-pressure chronic ulcer of other part of left foot limited to breakdown of skin: Secondary | ICD-10-CM | POA: Diagnosis not present

## 2021-12-11 DIAGNOSIS — Z125 Encounter for screening for malignant neoplasm of prostate: Secondary | ICD-10-CM | POA: Diagnosis not present

## 2021-12-11 DIAGNOSIS — I482 Chronic atrial fibrillation, unspecified: Secondary | ICD-10-CM | POA: Diagnosis not present

## 2021-12-11 DIAGNOSIS — N1831 Chronic kidney disease, stage 3a: Secondary | ICD-10-CM | POA: Diagnosis not present

## 2021-12-12 DIAGNOSIS — Q6672 Congenital pes cavus, left foot: Secondary | ICD-10-CM | POA: Diagnosis not present

## 2021-12-12 DIAGNOSIS — L97521 Non-pressure chronic ulcer of other part of left foot limited to breakdown of skin: Secondary | ICD-10-CM | POA: Diagnosis not present

## 2021-12-12 DIAGNOSIS — G20C Parkinsonism, unspecified: Secondary | ICD-10-CM | POA: Diagnosis not present

## 2021-12-12 DIAGNOSIS — M2042 Other hammer toe(s) (acquired), left foot: Secondary | ICD-10-CM | POA: Diagnosis not present

## 2021-12-18 DIAGNOSIS — Z0000 Encounter for general adult medical examination without abnormal findings: Secondary | ICD-10-CM | POA: Diagnosis not present

## 2021-12-18 DIAGNOSIS — G20C Parkinsonism, unspecified: Secondary | ICD-10-CM | POA: Diagnosis not present

## 2021-12-18 DIAGNOSIS — I480 Paroxysmal atrial fibrillation: Secondary | ICD-10-CM | POA: Diagnosis not present

## 2021-12-18 DIAGNOSIS — N1831 Chronic kidney disease, stage 3a: Secondary | ICD-10-CM | POA: Diagnosis not present

## 2021-12-18 DIAGNOSIS — R972 Elevated prostate specific antigen [PSA]: Secondary | ICD-10-CM | POA: Diagnosis not present

## 2021-12-18 DIAGNOSIS — C61 Malignant neoplasm of prostate: Secondary | ICD-10-CM | POA: Diagnosis not present

## 2021-12-18 DIAGNOSIS — R8290 Unspecified abnormal findings in urine: Secondary | ICD-10-CM | POA: Diagnosis not present

## 2021-12-18 DIAGNOSIS — Z125 Encounter for screening for malignant neoplasm of prostate: Secondary | ICD-10-CM | POA: Diagnosis not present

## 2021-12-18 DIAGNOSIS — D631 Anemia in chronic kidney disease: Secondary | ICD-10-CM | POA: Diagnosis not present

## 2021-12-29 DIAGNOSIS — M21372 Foot drop, left foot: Secondary | ICD-10-CM | POA: Diagnosis not present

## 2021-12-29 DIAGNOSIS — H6121 Impacted cerumen, right ear: Secondary | ICD-10-CM | POA: Diagnosis not present

## 2021-12-29 DIAGNOSIS — G20C Parkinsonism, unspecified: Secondary | ICD-10-CM | POA: Diagnosis not present

## 2022-01-09 DIAGNOSIS — Z9889 Other specified postprocedural states: Secondary | ICD-10-CM | POA: Diagnosis not present

## 2022-01-09 DIAGNOSIS — G20C Parkinsonism, unspecified: Secondary | ICD-10-CM | POA: Diagnosis not present

## 2022-01-09 DIAGNOSIS — Z950 Presence of cardiac pacemaker: Secondary | ICD-10-CM | POA: Diagnosis not present

## 2022-01-09 DIAGNOSIS — I480 Paroxysmal atrial fibrillation: Secondary | ICD-10-CM | POA: Diagnosis not present

## 2022-01-09 DIAGNOSIS — R0602 Shortness of breath: Secondary | ICD-10-CM | POA: Diagnosis not present

## 2022-01-09 DIAGNOSIS — Z23 Encounter for immunization: Secondary | ICD-10-CM | POA: Diagnosis not present

## 2022-01-23 DIAGNOSIS — Z961 Presence of intraocular lens: Secondary | ICD-10-CM | POA: Diagnosis not present

## 2022-01-23 DIAGNOSIS — H401122 Primary open-angle glaucoma, left eye, moderate stage: Secondary | ICD-10-CM | POA: Diagnosis not present

## 2022-01-23 DIAGNOSIS — H401111 Primary open-angle glaucoma, right eye, mild stage: Secondary | ICD-10-CM | POA: Diagnosis not present

## 2022-01-23 DIAGNOSIS — H35033 Hypertensive retinopathy, bilateral: Secondary | ICD-10-CM | POA: Diagnosis not present

## 2022-01-25 DIAGNOSIS — L97521 Non-pressure chronic ulcer of other part of left foot limited to breakdown of skin: Secondary | ICD-10-CM | POA: Diagnosis not present

## 2022-01-25 DIAGNOSIS — G20C Parkinsonism, unspecified: Secondary | ICD-10-CM | POA: Diagnosis not present

## 2022-01-25 DIAGNOSIS — Q6672 Congenital pes cavus, left foot: Secondary | ICD-10-CM | POA: Diagnosis not present

## 2022-02-16 ENCOUNTER — Emergency Department
Admission: EM | Admit: 2022-02-16 | Discharge: 2022-02-16 | Disposition: A | Discharge status: Home / Self Care | Payer: Medicare HMO | Attending: Emergency Medicine | Admitting: Emergency Medicine

## 2022-02-16 ENCOUNTER — Emergency Department: Payer: Medicare HMO

## 2022-02-16 ENCOUNTER — Other Ambulatory Visit: Payer: Self-pay

## 2022-02-16 DIAGNOSIS — S2243XA Multiple fractures of ribs, bilateral, initial encounter for closed fracture: Secondary | ICD-10-CM | POA: Diagnosis not present

## 2022-02-16 DIAGNOSIS — N210 Calculus in bladder: Secondary | ICD-10-CM | POA: Insufficient documentation

## 2022-02-16 DIAGNOSIS — K8689 Other specified diseases of pancreas: Secondary | ICD-10-CM | POA: Diagnosis not present

## 2022-02-16 DIAGNOSIS — R55 Syncope and collapse: Secondary | ICD-10-CM | POA: Diagnosis not present

## 2022-02-16 DIAGNOSIS — I4891 Unspecified atrial fibrillation: Secondary | ICD-10-CM | POA: Diagnosis not present

## 2022-02-16 DIAGNOSIS — I700 Atherosclerosis of aorta: Secondary | ICD-10-CM | POA: Diagnosis not present

## 2022-02-16 DIAGNOSIS — J90 Pleural effusion, not elsewhere classified: Secondary | ICD-10-CM | POA: Diagnosis not present

## 2022-02-16 DIAGNOSIS — K5790 Diverticulosis of intestine, part unspecified, without perforation or abscess without bleeding: Secondary | ICD-10-CM | POA: Diagnosis not present

## 2022-02-16 DIAGNOSIS — W1830XA Fall on same level, unspecified, initial encounter: Secondary | ICD-10-CM | POA: Insufficient documentation

## 2022-02-16 DIAGNOSIS — M546 Pain in thoracic spine: Secondary | ICD-10-CM | POA: Diagnosis present

## 2022-02-16 DIAGNOSIS — Z950 Presence of cardiac pacemaker: Secondary | ICD-10-CM | POA: Insufficient documentation

## 2022-02-16 DIAGNOSIS — M549 Dorsalgia, unspecified: Secondary | ICD-10-CM | POA: Diagnosis not present

## 2022-02-16 DIAGNOSIS — J9811 Atelectasis: Secondary | ICD-10-CM | POA: Insufficient documentation

## 2022-02-16 DIAGNOSIS — G20C Parkinsonism, unspecified: Secondary | ICD-10-CM | POA: Diagnosis not present

## 2022-02-16 DIAGNOSIS — S3991XA Unspecified injury of abdomen, initial encounter: Secondary | ICD-10-CM | POA: Diagnosis not present

## 2022-02-16 DIAGNOSIS — N400 Enlarged prostate without lower urinary tract symptoms: Secondary | ICD-10-CM | POA: Diagnosis not present

## 2022-02-16 DIAGNOSIS — I2510 Atherosclerotic heart disease of native coronary artery without angina pectoris: Secondary | ICD-10-CM | POA: Diagnosis not present

## 2022-02-16 DIAGNOSIS — M545 Low back pain, unspecified: Secondary | ICD-10-CM | POA: Diagnosis not present

## 2022-02-16 DIAGNOSIS — I10 Essential (primary) hypertension: Secondary | ICD-10-CM | POA: Insufficient documentation

## 2022-02-16 DIAGNOSIS — Z7901 Long term (current) use of anticoagulants: Secondary | ICD-10-CM | POA: Diagnosis not present

## 2022-02-16 DIAGNOSIS — S2241XD Multiple fractures of ribs, right side, subsequent encounter for fracture with routine healing: Secondary | ICD-10-CM | POA: Diagnosis not present

## 2022-02-16 DIAGNOSIS — S2242XA Multiple fractures of ribs, left side, initial encounter for closed fracture: Secondary | ICD-10-CM | POA: Diagnosis not present

## 2022-02-16 LAB — CBC
HCT: 42.2 % (ref 39.0–52.0)
Hemoglobin: 13.4 g/dL (ref 13.0–17.0)
MCH: 27.7 pg (ref 26.0–34.0)
MCHC: 31.8 g/dL (ref 30.0–36.0)
MCV: 87.2 fL (ref 80.0–100.0)
Platelets: 264 10*3/uL (ref 150–400)
RBC: 4.84 MIL/uL (ref 4.22–5.81)
RDW: 14.6 % (ref 11.5–15.5)
WBC: 10.6 10*3/uL — ABNORMAL HIGH (ref 4.0–10.5)
nRBC: 0 % (ref 0.0–0.2)

## 2022-02-16 LAB — BASIC METABOLIC PANEL
Anion gap: 14 (ref 5–15)
BUN: 33 mg/dL — ABNORMAL HIGH (ref 8–23)
CO2: 16 mmol/L — ABNORMAL LOW (ref 22–32)
Calcium: 9.2 mg/dL (ref 8.9–10.3)
Chloride: 108 mmol/L (ref 98–111)
Creatinine, Ser: 1.06 mg/dL (ref 0.61–1.24)
GFR, Estimated: >60 mL/min (ref >60)
Glucose, Bld: 78 mg/dL (ref 70–99)
Potassium: 4.7 mmol/L (ref 3.5–5.1)
Sodium: 138 mmol/L (ref 135–145)

## 2022-02-16 LAB — TROPONIN I (HIGH SENSITIVITY): Troponin I (High Sensitivity): 4 ng/L (ref <18)

## 2022-02-16 MED ORDER — IOHEXOL 300 MG/ML  SOLN
100.0000 mL | Freq: Once | INTRAMUSCULAR | Status: AC | PRN
  Administered 2022-02-16: 100 mL via INTRAVENOUS

## 2022-02-16 NOTE — ED Provider Notes (Signed)
[facility] Provider Note    Event Date/Time   First MD Initiated Contact with Patient 02/16/22 1316     (approximate)   History   Loss of Consciousness   HPI  Daniel [name] is a 82 y.o. male   Past medical history of Parkinson's disease, hypertension, CAD atrial fibrillation not on anticoagulation who presents emergency department with back pain after a fall sustained on Sunday.  He has poor balance due to his Parkinson's and lost his balance and fell denies head strike or headache but over the last few days have developed worsening right-sided back pain.  He went to his primary doctor today who sent him to the emergency department for further workup.  Denies syncope, no other acute medical complaints.   Independent Historian contributed to assessment above: His wife       Physical Exam   Triage Vital Signs: ED Triage Vitals  Enc Vitals Group     BP 02/16/22 1049 100/67     Pulse Rate 02/16/22 1049 60     Resp 02/16/22 1049 18     Temp 02/16/22 1049 97.6 F (36.4 C)     Temp Source 02/16/22 1049 Oral     SpO2 02/16/22 1049 100 %     Weight --      Height --      Head Circumference --      Peak Flow --      Pain Score 02/16/22 1051 5     Pain Loc --      Pain Edu? --      Excl. in [place]? --     Most recent vital signs: Vitals:   02/16/22 1402 02/16/22 1608  BP: 110/70 112/75  Pulse: 68 70  Resp: 18 18  Temp:  97.9 F (36.6 C)  SpO2: 100% 99%    General: Awake, no distress.  CV:  Good peripheral perfusion.  Resp:  Normal effort.  Abd:  No distention.  Other:  Awake alert oriented comfortable hemodynamics appropriate reassuring, he has tenderness to the right posterior chest wall, lung sounds clear abdomen soft nontender moving all extremities with full active range of motion.  No signs of head trauma.  Neck supple with full range of motion.  No point tenderness to the midline CT or L-spine   ED Results / Procedures / Treatments    Labs (all labs ordered are listed, but only abnormal results are displayed) Labs Reviewed  BASIC METABOLIC PANEL - Abnormal; Notable for the following components:      Result Value   CO2 16 (*)    BUN 33 (*)    All other components within normal limits  CBC - Abnormal; Notable for the following components:   WBC 10.6 (*)    All other components within normal limits  URINALYSIS, ROUTINE W REFLEX MICROSCOPIC  CBG MONITORING, ED  TROPONIN I (HIGH SENSITIVITY)     I ordered and reviewed the above labs they are notable for mildly elevated white blood cell count 10.6  EKG  ED ECG REPORT I, Lucillie Garfinkel, the attending physician, personally viewed and interpreted this ECG.   Date: 02/16/2022  EKG Time: 1100  Rate: 94  Rhythm: AF 90s  Axis: nl  Intervals:none  ST&T Change: no acute ischemia    RADIOLOGY I independently reviewed and interpreted CT of the head without contrast see no obvious bleeding or midline shift   PROCEDURES:  Critical Care performed: No  Procedures  MEDICATIONS ORDERED IN ED: Medications  iohexol (OMNIPAQUE) 300 MG/ML solution 100 mL (100 mLs Intravenous Contrast Given 02/16/22 1435)     IMPRESSION / MDM / ASSESSMENT AND PLAN / ED COURSE  I reviewed the triage vital signs and the nursing notes.                                Patient's presentation is most consistent with acute presentation with potential threat to life or bodily function.  Differential diagnosis includes, but is not limited to, traumatic injury including intracranial bleeding, rib fractures, other fractures dislocations, internal bleeding.     MDM: Patient with back pain found to have multiple posterior rib fractures without hemo or pneumothorax.  He has been 5 days postinjury and able to perform activities of daily living at home.  Fortunately head scan is negative for acute bleeding.  The mechanism of injury was quite clear and that he lost his balance with known  Parkinson's disease, denies chest pain, arrhythmia, shortness of breath surrounding his fall to suggest medical cause for his fall.  I offered admission for pain control given his multiple rib fractures but patient declines at this time stating his pain was well-controlled and he is able to do what he needs to do at home.  Incentive spirometer given.  He will follow-up with PMD.         FINAL CLINICAL IMPRESSION(S) / ED DIAGNOSES   Final diagnoses:  Closed fracture of multiple ribs of both sides, initial encounter     Rx / DC Orders   ED Discharge Orders     None        Note:  This document was prepared using Dragon voice recognition software and may include unintentional dictation errors.    Lucillie Garfinkel, MD 02/16/22 [phone]

## 2022-02-16 NOTE — ED Triage Notes (Addendum)
Pt to ED via [place]. Pt was sent over due to a fall on the 21st. Pt hit face on door and had + LOC. Pt reports right mid back pain. Pt denies blood thinners. Pt denies blurry vision or HA. Pt has pacemaker and hx afib

## 2022-02-16 NOTE — ED Notes (Signed)
Discharge instructions explained to patient and family at this time. Patient and family state they understand and agree.   

## 2022-02-16 NOTE — Discharge Instructions (Signed)
Use your incentive spirometer to take deep breaths as instructed.  Take Tylenol 1000 mg every 8 hours as needed for pain.  See your doctor this week for a follow-up appointment.  Thank you for choosing [place] for your health care today!  Please see your primary doctor this week for a follow up appointment.   Sometimes, in the early stages of certain disease courses it is difficult to detect in the emergency department evaluation -- so, it is important that you continue to monitor your symptoms and call your doctor right away or return to the emergency department if you develop any new or worsening symptoms.  Please go to the following website to schedule new (and existing) patient appointments:   [email or web address]  If you do not have a primary doctor try calling the following clinics to establish care:  If you have insurance:  [facility] [phone] [place] [address]   [facility]  [phone] [place] [place] [address]   If you do not have insurance:  [facility]  [phone] [address] [place] [address]   The following is another list of primary care offices in the area who are accepting new patients at this time.  Please reach out to one of them directly and let them know you would like to schedule an appointment to follow up on an Emergency Department visit, and/or to establish a new primary care provider (PCP).  There are likely other primary care clinics in the are who are accepting new patients, but this is an excellent place to start:  [place] physician: Dr Lavon Paganini [address] [place], [place] [address] [phone]  [facility] Lead Physician: Dr Steele Sizer [address], [place], [place] [address] [phone]  [place] Physician: Dr Park Liter [address] [place], [place] [address] [phone]  [facility] Lead Physician: Dr Dewaine Oats [place], [place], [place] [address] [phone]  [facility] Physician: Dr Halina Maidens [address] [place], [place] [address] [phone]   It was my pleasure to care for you today.   Hoover Brunette Jacelyn Grip, MD

## 2022-02-18 ENCOUNTER — Encounter: Payer: Self-pay | Admitting: Emergency Medicine

## 2022-02-18 ENCOUNTER — Emergency Department: Payer: Medicare HMO

## 2022-02-18 ENCOUNTER — Other Ambulatory Visit: Payer: Self-pay

## 2022-02-18 DIAGNOSIS — S0003XA Contusion of scalp, initial encounter: Secondary | ICD-10-CM | POA: Insufficient documentation

## 2022-02-18 DIAGNOSIS — W19XXXA Unspecified fall, initial encounter: Secondary | ICD-10-CM | POA: Diagnosis not present

## 2022-02-18 DIAGNOSIS — S0083XA Contusion of other part of head, initial encounter: Secondary | ICD-10-CM | POA: Diagnosis not present

## 2022-02-18 DIAGNOSIS — R55 Syncope and collapse: Secondary | ICD-10-CM | POA: Diagnosis not present

## 2022-02-18 DIAGNOSIS — S0990XA Unspecified injury of head, initial encounter: Secondary | ICD-10-CM | POA: Diagnosis not present

## 2022-02-18 DIAGNOSIS — S199XXA Unspecified injury of neck, initial encounter: Secondary | ICD-10-CM | POA: Diagnosis not present

## 2022-02-18 DIAGNOSIS — S0101XA Laceration without foreign body of scalp, initial encounter: Secondary | ICD-10-CM | POA: Diagnosis not present

## 2022-02-18 NOTE — ED Triage Notes (Signed)
Pt to ED via POV, pt states hx of parkinsons, pt states stood up and lost his balance and fell back and his head. Pt with small laceration to posterior head, bleeding controlled on arrival.

## 2022-02-19 ENCOUNTER — Emergency Department
Admission: EM | Admit: 2022-02-19 | Discharge: 2022-02-19 | Disposition: A | Discharge status: Home / Self Care | Payer: Medicare HMO | Attending: Emergency Medicine | Admitting: Emergency Medicine

## 2022-02-19 DIAGNOSIS — S0990XA Unspecified injury of head, initial encounter: Secondary | ICD-10-CM

## 2022-02-19 DIAGNOSIS — W19XXXA Unspecified fall, initial encounter: Secondary | ICD-10-CM

## 2022-02-19 DIAGNOSIS — S0003XA Contusion of scalp, initial encounter: Secondary | ICD-10-CM

## 2022-02-19 DIAGNOSIS — S0101XA Laceration without foreign body of scalp, initial encounter: Secondary | ICD-10-CM

## 2022-02-19 NOTE — ED Notes (Signed)
ED Provider at bedside. 

## 2022-02-19 NOTE — ED Notes (Signed)
Pt brought to ED rm 2 at this time, this RN now assuming care.

## 2022-02-19 NOTE — Discharge Instructions (Signed)
Please keep your wound clean and dry.  You can gently wash the area starting tomorrow after the glue has had a chance to fully set, but do not scrub the wound.  It should heal within about a week.  Follow-up with your regular doctor and continue taking your regular medications.

## 2022-02-19 NOTE — ED Provider Notes (Signed)
[facility] Provider Note    Event Date/Time   First MD Initiated Contact with Patient 02/19/22 [phone]     (approximate)   History   Fall   HPI  Daniel [name] is a 82 y.o. male whose medical history is most notable for Parkinson disease followed by Dr. Manuella Ghazi.  He presents after a fall.  He has frequent falls and was just seen recently after a fall that resulted in some rib fractures.  He said he is doing better from that but he fell tonight after he lost balance and he struck the back of his head on a metal table.  He did not lose consciousness and he has no pain in his neck, just some pain in the back of his head.  No chest pain or shortness of breath.  No recent infectious signs or symptoms and no fever.     Physical Exam   Triage Vital Signs: ED Triage Vitals  Enc Vitals Group     BP 02/18/22 2327 125/84     Pulse Rate 02/18/22 2327 95     Resp 02/18/22 2327 20     Temp 02/18/22 2327 98.1 F (36.7 C)     Temp Source 02/18/22 2327 Oral     SpO2 02/18/22 2327 100 %     Weight 02/18/22 2327 73 kg (160 lb 15 oz)     Height 02/18/22 2327 1.88 m ('6\' 2"'$ )     Head Circumference --      Peak Flow --      Pain Score 02/18/22 2328 7     Pain Loc --      Pain Edu? --      Excl. in [place]? --     Most recent vital signs: Vitals:   02/19/22 0200 02/19/22 0230  BP: (!) 129/92 118/70  Pulse: 78 94  Resp: 17 18  Temp:    SpO2: 99% 98%     General: Awake, no distress.  Head:  Patient has a 4 cm horizontal clean laceration to the back of his scalp.  Minor surrounding hematoma.  No other injuries are evident. CV:  Good peripheral perfusion.  Normal heart sounds, regular rate and rhythm. Resp:  Normal effort. Speaking easily and comfortably, no accessory muscle usage nor intercostal retractions.  Lungs clear to auscultation. Abd:  No distention.  Other:  Pleasant, normal mood and affect, conversant, no distress.  Patient is at his baseline mental status  according to his wife who is at bedside.   ED Results / Procedures / Treatments   Labs (all labs ordered are listed, but only abnormal results are displayed) Labs Reviewed - No data to display     RADIOLOGY I viewed and interpreted the patient's head and cervical spine CTs.  No evidence of intracranial hemorrhage nor cervical spine injury.  I also read the radiologist's reports, which confirmed no acute findings.    PROCEDURES:  Critical Care performed: No  ..Laceration Repair  Date/Time: 02/19/2022 3:07 AM  Performed by: Hinda Kehr, MD Authorized by: Hinda Kehr, MD   Consent:    Consent obtained:  Verbal   Consent given by:  Patient and spouse Universal protocol:    Patient identity confirmed:  Verbally with patient Laceration details:    Location:  Scalp   Scalp location:  Occipital   Length (cm):  4 Exploration:    Hemostasis achieved with:  Direct pressure   Imaging outcome: foreign body not noted  Contaminated: no   Treatment:    Area cleansed with:  Soap and water   Amount of cleaning:  Standard   Irrigation solution:  Sterile saline Skin repair:    Repair method:  Tissue adhesive Approximation:    Approximation:  Close Repair type:    Repair type:  Simple Post-procedure details:    Dressing:  Open (no dressing)   Procedure completion:  Tolerated well, no immediate complications    MEDICATIONS ORDERED IN ED: Medications - No data to display   IMPRESSION / MDM / [place] / ED COURSE  I reviewed the triage vital signs and the nursing notes.                              Differential diagnosis includes, but is not limited to, scalp laceration, hematoma, intracranial hemorrhage, skull fracture, cervical spine injury.  Patient's presentation is most consistent with acute presentation with potential threat to life or bodily function.  Labs/studies ordered: CT head, CT cervical spine  In spite of the patient's chronic conditions  and his acute injury, he is well-appearing, conversant, and at his baseline.  His wife is with him.  His vital signs are stable and he has no concerning exam findings other than the laceration on the back of his scalp.  As document above, his imaging is reassuring.  I discussed various treatment methods with the patient and his wife, and his preference is to try Dermabond.  He had relatively thin hair and I was able to trim away the hair around the wound and irrigated it extensively so that I could close it easily and adequately with Dermabond.  This was quite successful and the patient and his wife were pleased with the results.  I gave my usual customary management recommendations and return precautions and they agree with the plan.  No indication for hospitalization or additional treatment at this time.        FINAL CLINICAL IMPRESSION(S) / ED DIAGNOSES   Final diagnoses:  Fall, initial encounter  Laceration of scalp, initial encounter  Minor head injury, initial encounter  Contusion of scalp, initial encounter     Rx / DC Orders   ED Discharge Orders     None        Note:  This document was prepared using Dragon voice recognition software and may include unintentional dictation errors.   Hinda Kehr, MD 02/19/22 [phone]

## 2022-02-21 ENCOUNTER — Telehealth: Payer: Self-pay

## 2022-02-21 NOTE — Telephone Encounter (Signed)
        Patient  visited [place] on 1/29     Telephone encounter attempt :  1st  A HIPAA compliant voice message was left requesting a return call.  Instructed patient to call back    [facility] [phone] [address], [place], [place] [address] Phone: [phone] Email: [email or web address]

## 2022-02-22 DIAGNOSIS — G20C Parkinsonism, unspecified: Secondary | ICD-10-CM | POA: Diagnosis not present

## 2022-02-22 DIAGNOSIS — L97521 Non-pressure chronic ulcer of other part of left foot limited to breakdown of skin: Secondary | ICD-10-CM | POA: Diagnosis not present

## 2022-02-22 DIAGNOSIS — Q6672 Congenital pes cavus, left foot: Secondary | ICD-10-CM | POA: Diagnosis not present

## 2022-02-27 ENCOUNTER — Telehealth: Payer: Self-pay

## 2022-02-27 NOTE — Telephone Encounter (Signed)
        Patient  visited [place] on 1/29  Telephone encounter attempt :   2nd A HIPAA compliant voice message was left requesting a return call.  Instructed patient to call back .    [facility] [phone] [address], [place], [place] [address] Phone: [phone] Email: [email or web address]

## 2022-03-13 DIAGNOSIS — I480 Paroxysmal atrial fibrillation: Secondary | ICD-10-CM | POA: Diagnosis not present

## 2022-03-13 DIAGNOSIS — R296 Repeated falls: Secondary | ICD-10-CM | POA: Diagnosis not present

## 2022-03-13 DIAGNOSIS — G20C Parkinsonism, unspecified: Secondary | ICD-10-CM | POA: Diagnosis not present

## 2022-03-22 DIAGNOSIS — Q6672 Congenital pes cavus, left foot: Secondary | ICD-10-CM | POA: Diagnosis not present

## 2022-03-22 DIAGNOSIS — L97521 Non-pressure chronic ulcer of other part of left foot limited to breakdown of skin: Secondary | ICD-10-CM | POA: Diagnosis not present

## 2022-03-22 DIAGNOSIS — M79675 Pain in left toe(s): Secondary | ICD-10-CM | POA: Diagnosis not present

## 2022-03-22 DIAGNOSIS — M79674 Pain in right toe(s): Secondary | ICD-10-CM | POA: Diagnosis not present

## 2022-03-22 DIAGNOSIS — B351 Tinea unguium: Secondary | ICD-10-CM | POA: Diagnosis not present

## 2022-04-30 DIAGNOSIS — L02612 Cutaneous abscess of left foot: Secondary | ICD-10-CM | POA: Diagnosis not present

## 2022-04-30 DIAGNOSIS — L97521 Non-pressure chronic ulcer of other part of left foot limited to breakdown of skin: Secondary | ICD-10-CM | POA: Diagnosis not present

## 2022-04-30 DIAGNOSIS — L03032 Cellulitis of left toe: Secondary | ICD-10-CM | POA: Diagnosis not present

## 2022-05-16 DIAGNOSIS — I4891 Unspecified atrial fibrillation: Secondary | ICD-10-CM | POA: Diagnosis not present

## 2022-05-16 DIAGNOSIS — Z950 Presence of cardiac pacemaker: Secondary | ICD-10-CM | POA: Diagnosis not present

## 2022-06-11 DIAGNOSIS — M79675 Pain in left toe(s): Secondary | ICD-10-CM | POA: Diagnosis not present

## 2022-06-11 DIAGNOSIS — S90811A Abrasion, right foot, initial encounter: Secondary | ICD-10-CM | POA: Diagnosis not present

## 2022-06-11 DIAGNOSIS — L97521 Non-pressure chronic ulcer of other part of left foot limited to breakdown of skin: Secondary | ICD-10-CM | POA: Diagnosis not present

## 2022-06-11 DIAGNOSIS — M79674 Pain in right toe(s): Secondary | ICD-10-CM | POA: Diagnosis not present

## 2022-06-11 DIAGNOSIS — Q6672 Congenital pes cavus, left foot: Secondary | ICD-10-CM | POA: Diagnosis not present

## 2022-06-11 DIAGNOSIS — B351 Tinea unguium: Secondary | ICD-10-CM | POA: Diagnosis not present

## 2022-06-14 DIAGNOSIS — Z961 Presence of intraocular lens: Secondary | ICD-10-CM | POA: Diagnosis not present

## 2022-06-14 DIAGNOSIS — H401122 Primary open-angle glaucoma, left eye, moderate stage: Secondary | ICD-10-CM | POA: Diagnosis not present

## 2022-06-14 DIAGNOSIS — H35033 Hypertensive retinopathy, bilateral: Secondary | ICD-10-CM | POA: Diagnosis not present

## 2022-06-14 DIAGNOSIS — H401111 Primary open-angle glaucoma, right eye, mild stage: Secondary | ICD-10-CM | POA: Diagnosis not present

## 2022-07-05 ENCOUNTER — Other Ambulatory Visit: Payer: Self-pay

## 2022-07-05 ENCOUNTER — Emergency Department: Payer: Medicare HMO

## 2022-07-05 ENCOUNTER — Emergency Department
Admission: EM | Admit: 2022-07-05 | Discharge: 2022-07-05 | Disposition: A | Discharge status: Home / Self Care | Payer: Medicare HMO | Attending: Emergency Medicine | Admitting: Emergency Medicine

## 2022-07-05 DIAGNOSIS — I2510 Atherosclerotic heart disease of native coronary artery without angina pectoris: Secondary | ICD-10-CM | POA: Diagnosis not present

## 2022-07-05 DIAGNOSIS — R55 Syncope and collapse: Secondary | ICD-10-CM | POA: Diagnosis not present

## 2022-07-05 DIAGNOSIS — Y92810 Car as the place of occurrence of the external cause: Secondary | ICD-10-CM | POA: Insufficient documentation

## 2022-07-05 DIAGNOSIS — S0990XA Unspecified injury of head, initial encounter: Secondary | ICD-10-CM | POA: Insufficient documentation

## 2022-07-05 DIAGNOSIS — W01198A Fall on same level from slipping, tripping and stumbling with subsequent striking against other object, initial encounter: Secondary | ICD-10-CM | POA: Diagnosis not present

## 2022-07-05 DIAGNOSIS — Z950 Presence of cardiac pacemaker: Secondary | ICD-10-CM | POA: Diagnosis not present

## 2022-07-05 DIAGNOSIS — I10 Essential (primary) hypertension: Secondary | ICD-10-CM | POA: Diagnosis not present

## 2022-07-05 DIAGNOSIS — I6381 Other cerebral infarction due to occlusion or stenosis of small artery: Secondary | ICD-10-CM | POA: Diagnosis not present

## 2022-07-05 DIAGNOSIS — R296 Repeated falls: Secondary | ICD-10-CM

## 2022-07-05 DIAGNOSIS — G20A1 Parkinson's disease without dyskinesia, without mention of fluctuations: Secondary | ICD-10-CM | POA: Insufficient documentation

## 2022-07-05 DIAGNOSIS — W19XXXA Unspecified fall, initial encounter: Secondary | ICD-10-CM

## 2022-07-05 LAB — T4, FREE: Free T4: 0.78 ng/dL (ref 0.61–1.12)

## 2022-07-05 LAB — BASIC METABOLIC PANEL
Anion gap: 8 (ref 5–15)
BUN: 36 mg/dL — ABNORMAL HIGH (ref 8–23)
CO2: 23 mmol/L (ref 22–32)
Calcium: 9.1 mg/dL (ref 8.9–10.3)
Chloride: 106 mmol/L (ref 98–111)
Creatinine, Ser: 1.23 mg/dL (ref 0.61–1.24)
GFR, Estimated: 59 mL/min — ABNORMAL LOW (ref >60)
Glucose, Bld: 103 mg/dL — ABNORMAL HIGH (ref 70–99)
Potassium: 4.3 mmol/L (ref 3.5–5.1)
Sodium: 137 mmol/L (ref 135–145)

## 2022-07-05 LAB — CBC
HCT: 38.2 % — ABNORMAL LOW (ref 39.0–52.0)
Hemoglobin: 12.1 g/dL — ABNORMAL LOW (ref 13.0–17.0)
MCH: 27.4 pg (ref 26.0–34.0)
MCHC: 31.7 g/dL (ref 30.0–36.0)
MCV: 86.6 fL (ref 80.0–100.0)
Platelets: 266 10*3/uL (ref 150–400)
RBC: 4.41 MIL/uL (ref 4.22–5.81)
RDW: 15.5 % (ref 11.5–15.5)
WBC: 6.6 10*3/uL (ref 4.0–10.5)
nRBC: 0 % (ref 0.0–0.2)

## 2022-07-05 LAB — MAGNESIUM: Magnesium: 2.2 mg/dL (ref 1.7–2.4)

## 2022-07-05 LAB — TSH: TSH: 1.567 u[IU]/mL (ref 0.350–4.500)

## 2022-07-05 LAB — TROPONIN I (HIGH SENSITIVITY): Troponin I (High Sensitivity): 5 ng/L (ref <18)

## 2022-07-05 NOTE — ED Triage Notes (Signed)
Pt here with loss of consciousness. Pt states he has passed out 5 times in the past week. Pt states yesterday he reached to get in the car and felt like the handle moved and he fell backwards, pt states he hit the back of his head but not hard. Pt denies being on a blood thinner and states he has had multiple head scans recently. Pt has hx of Parkinson's.

## 2022-07-05 NOTE — ED Provider Notes (Addendum)
[facility] Provider Note    Event Date/Time   First MD Initiated Contact with Patient 07/05/22 1038     (approximate)   History   Loss of Consciousness   HPI  Daniel [name] is a 82 y.o. male   Past medical history of Parkinson's, frequent falls, CAD, atrial fibrillation, hypertension, has a pacemaker, presents to the emergency department sustained yesterday.  He was reaching for his car to work missed it and fell backwards.  Had head strike, unknown loss of consciousness.  Was able to ambulate afterwards.  Here for assessment today  No acute medical complaints, no recent illnesses.  Walks with a cane usually.  He denies presyncopal symptoms like chest pain, shortness of breath, palpitations  External Medical Documents Reviewed: Emergency department visit in January 2024 for a fall sustained a laceration to his head which was repaired.      Physical Exam   Triage Vital Signs: ED Triage Vitals  Enc Vitals Group     BP 07/05/22 1005 111/73     Pulse Rate 07/05/22 1005 86     Resp 07/05/22 1005 18     Temp 07/05/22 1005 98 F (36.7 C)     Temp Source 07/05/22 1005 Oral     SpO2 07/05/22 1005 100 %     Weight 07/05/22 1006 160 lb 15 oz (73 kg)     Height 07/05/22 1006 6\' 2"  (1.88 m)     Head Circumference --      Peak Flow --      Pain Score 07/05/22 1006 0     Pain Loc --      Pain Edu? --      Excl. in [place]? --     Most recent vital signs: Vitals:   07/05/22 1005  BP: 111/73  Pulse: 86  Resp: 18  Temp: 98 F (36.7 C)  SpO2: 100%    General: Awake, no distress.  CV:  Good peripheral perfusion.  Resp:  Normal effort.  Abd:  No distention.  Other:  Awake alert and oriented, dysarthria baseline since 2021 per pt, normal VS, no obvious injuries on head to toe examination.   ED Results / Procedures / Treatments   Labs (all labs ordered are listed, but only abnormal results are displayed) Labs Reviewed  BASIC METABOLIC PANEL -  Abnormal; Notable for the following components:      Result Value   Glucose, Bld 103 (*)    BUN 36 (*)    GFR, Estimated 59 (*)    All other components within normal limits  CBC - Abnormal; Notable for the following components:   Hemoglobin 12.1 (*)    HCT 38.2 (*)    All other components within normal limits  URINALYSIS, ROUTINE W REFLEX MICROSCOPIC  MAGNESIUM  TSH  T4, FREE  CBG MONITORING, ED  TROPONIN I (HIGH SENSITIVITY)     I ordered and reviewed the above labs they are notable for nl WBC  EKG  ED ECG REPORT I, Pilar Jarvis, the attending physician, personally viewed and interpreted this ECG.   Date: 07/05/2022  EKG Time: 1003  Rate: 87  Rhythm: v paced  ST&T Change: no stemi    RADIOLOGY I independently reviewed and interpreted CT head neg for bleed   PROCEDURES:  Critical Care performed: No  Procedures   MEDICATIONS ORDERED IN ED: Medications - No data to display    IMPRESSION / MDM / ASSESSMENT AND PLAN / ED  COURSE  I reviewed the triage vital signs and the nursing notes.                                Patient's presentation is most consistent with acute presentation with potential threat to life or bodily function.  Differential diagnosis includes, but is not limited to, mobility issues related to Parkinson's, dysautonomia, dysrhythmia, ACS, traumatic injury including intracranial bleeding, fracture dislocations, internal bleeding, electrolyte disturbance, dehydration, infx   The patient is on the cardiac monitor to evaluate for evidence of arrhythmia and/or significant heart rate changes.  MDM:    Patient with frequent falls with another fall yesterday which seems mechanical in nature due to missing the handle of his car door.  He reports no other medical complaints and looks well has been ambulating no obvious trauma on my exam.  Will check basic labs, EKG, interrogate his pacemaker (report received from [employer] no notable events since  last interrogation in April 2024), CT of the head to rule out intracranial bleeding.    If w/u Unremarkable  & patient remain stable plan will be for discharge.         FINAL CLINICAL IMPRESSION(S) / ED DIAGNOSES   Final diagnoses:  Fall, initial encounter  Frequent falls  Minor closed head injury     Rx / DC Orders   ED Discharge Orders     None        Note:  This document was prepared using Dragon voice recognition software and may include unintentional dictation errors.    Pilar Jarvis, MD 07/05/22 1115    Pilar Jarvis, MD 07/05/22 [phone]

## 2022-07-10 ENCOUNTER — Telehealth: Payer: Self-pay | Admitting: *Deleted

## 2022-07-10 DIAGNOSIS — Z950 Presence of cardiac pacemaker: Secondary | ICD-10-CM | POA: Diagnosis not present

## 2022-07-10 DIAGNOSIS — G20C Parkinsonism, unspecified: Secondary | ICD-10-CM | POA: Diagnosis not present

## 2022-07-10 DIAGNOSIS — I480 Paroxysmal atrial fibrillation: Secondary | ICD-10-CM | POA: Diagnosis not present

## 2022-07-10 DIAGNOSIS — R0602 Shortness of breath: Secondary | ICD-10-CM | POA: Diagnosis not present

## 2022-07-10 DIAGNOSIS — Z9889 Other specified postprocedural states: Secondary | ICD-10-CM | POA: Diagnosis not present

## 2022-07-10 NOTE — Telephone Encounter (Signed)
Transition Care Management Unsuccessful Follow-up Telephone Call  Date of discharge and from where:  [facility]  07/05/2022  Attempts:  1st Attempt  Reason for unsuccessful TCM follow-up call:  Left voice message

## 2022-07-11 ENCOUNTER — Telehealth: Payer: Self-pay | Admitting: *Deleted

## 2022-07-11 NOTE — Telephone Encounter (Signed)
Transition Care Management Unsuccessful Follow-up Telephone Call  Date of discharge and from where:  [facility]  Attempts:  2nd attempt  Reason for unsuccessful TCM follow-up call:  Left voice message    

## 2022-07-15 ENCOUNTER — Emergency Department
Admission: EM | Admit: 2022-07-15 | Discharge: 2022-07-15 | Disposition: A | Discharge status: Home / Self Care | Payer: Medicare HMO | Attending: Emergency Medicine | Admitting: Emergency Medicine

## 2022-07-15 ENCOUNTER — Encounter: Payer: Self-pay | Admitting: Emergency Medicine

## 2022-07-15 ENCOUNTER — Other Ambulatory Visit: Payer: Self-pay

## 2022-07-15 ENCOUNTER — Emergency Department: Payer: Medicare HMO

## 2022-07-15 DIAGNOSIS — Z23 Encounter for immunization: Secondary | ICD-10-CM | POA: Diagnosis not present

## 2022-07-15 DIAGNOSIS — G20A1 Parkinson's disease without dyskinesia, without mention of fluctuations: Secondary | ICD-10-CM | POA: Insufficient documentation

## 2022-07-15 DIAGNOSIS — I2510 Atherosclerotic heart disease of native coronary artery without angina pectoris: Secondary | ICD-10-CM | POA: Diagnosis not present

## 2022-07-15 DIAGNOSIS — Z8546 Personal history of malignant neoplasm of prostate: Secondary | ICD-10-CM | POA: Insufficient documentation

## 2022-07-15 DIAGNOSIS — Y92031 Bathroom in apartment as the place of occurrence of the external cause: Secondary | ICD-10-CM | POA: Insufficient documentation

## 2022-07-15 DIAGNOSIS — W01198A Fall on same level from slipping, tripping and stumbling with subsequent striking against other object, initial encounter: Secondary | ICD-10-CM | POA: Diagnosis not present

## 2022-07-15 DIAGNOSIS — S0990XA Unspecified injury of head, initial encounter: Secondary | ICD-10-CM | POA: Diagnosis not present

## 2022-07-15 DIAGNOSIS — S0101XA Laceration without foreign body of scalp, initial encounter: Secondary | ICD-10-CM | POA: Insufficient documentation

## 2022-07-15 DIAGNOSIS — S01312A Laceration without foreign body of left ear, initial encounter: Secondary | ICD-10-CM | POA: Insufficient documentation

## 2022-07-15 DIAGNOSIS — I10 Essential (primary) hypertension: Secondary | ICD-10-CM | POA: Insufficient documentation

## 2022-07-15 DIAGNOSIS — S199XXA Unspecified injury of neck, initial encounter: Secondary | ICD-10-CM | POA: Diagnosis not present

## 2022-07-15 DIAGNOSIS — W19XXXA Unspecified fall, initial encounter: Secondary | ICD-10-CM

## 2022-07-15 DIAGNOSIS — I6782 Cerebral ischemia: Secondary | ICD-10-CM | POA: Diagnosis not present

## 2022-07-15 MED ORDER — LIDOCAINE HCL (PF) 1 % IJ SOLN: 30.0000 mL | Freq: Once | INTRAMUSCULAR | Status: DC

## 2022-07-15 MED ORDER — TETANUS-DIPHTH-ACELL PERTUSSIS 5-2.5-18.5 LF-MCG/0.5 IM SUSY
0.5000 mL | PREFILLED_SYRINGE | Freq: Once | INTRAMUSCULAR | Status: AC
  Administered 2022-07-15: 0.5 mL via INTRAMUSCULAR
  Filled 2022-07-15: qty 0.5

## 2022-07-15 MED ORDER — LIDOCAINE HCL (PF) 1 % IJ SOLN
5.0000 mL | Freq: Once | INTRAMUSCULAR | Status: AC
  Administered 2022-07-15: 5 mL
  Filled 2022-07-15: qty 5

## 2022-07-15 NOTE — Discharge Instructions (Addendum)
You should have your staples and stitches on your ear removed in about 1 week.  Your CAT scan of your head and neck did not show any acute injuries but we are seeing a soft tissue abnormality in the cervical spine which could be either a lipoma or liposarcoma.  You will need to have additional imaging of the spine and potentially a CT with contrast to further characterize this.  Please discuss this with your primary care doctor.

## 2022-07-15 NOTE — ED Provider Notes (Signed)
[facility] Provider Note    Event Date/Time   First MD Initiated Contact with Patient 07/15/22 1051     (approximate)   History   Fall   HPI  Daniel [name] is a 82 y.o. male past medical history of atrial fibrillation not anticoagulated, coronary artery disease, Parkinson's disease who presents after a fall.  Patient was in the bathroom at church when he stepped backwards and then fell backwards hitting his head.  His wife heard him fall.  Did not lose consciousness.  He hit the left side of his head.  He denies any other pain including neck pain numbness tingling weakness chest pain or pelvic pain.  Still has been able to bear weight since.  He is not on any blood thinners.  Unsure of last tetanus.  Patient has had multiple falls recently typically when he is in the bathroom and stepping backward typically falls backwards and hit his head.  He has otherwise been well no confusion fevers or other symptoms.     Past Medical History:  Diagnosis Date   AF (atrial fibrillation) (HCC)    B12 deficiency    Coronary artery disease    DH (dermatitis herpetiformis)    Dysphagia    Hypertension    Presence of permanent cardiac pacemaker    2006   Primary parkinsonism    Prostate cancer (HCC) 2014    There are no problems to display for this patient.    Physical Exam  Triage Vital Signs: ED Triage Vitals  Enc Vitals Group     BP 07/15/22 1030 132/83     Pulse Rate 07/15/22 1030 88     Resp 07/15/22 1030 18     Temp 07/15/22 1030 98.1 F (36.7 C)     Temp src --      SpO2 07/15/22 1030 100 %     Weight 07/15/22 1034 155 lb (70.3 kg)     Height 07/15/22 1034 6\' 2"  (1.88 m)     Head Circumference --      Peak Flow --      Pain Score 07/15/22 1034 8     Pain Loc --      Pain Edu? --      Excl. in GC? --     Most recent vital signs: Vitals:   07/15/22 1030  BP: 132/83  Pulse: 88  Resp: 18  Temp: 98.1 F (36.7 C)  SpO2: 100%      General: Awake, no distress.  CV:  Good peripheral perfusion. Resp:  Normal effort.  Abd:  No distention.  Neuro:             Awake, Alert, Oriented x 3  Other:  There is an approximately 3 cm linear laceration of the scalp in the posterior occiput Patient has a laceration through and through his ear lobule which is hanging from a small piece of skin attached near the skull, no exposed cartilage   ED Results / Procedures / Treatments  Labs (all labs ordered are listed, but only abnormal results are displayed) Labs Reviewed - No data to display   EKG     RADIOLOGY I reviewed interpreted patient CT which is negative for acute intracranial hemorrhage   PROCEDURES:  Critical Care performed: No  ..Laceration Repair  Date/Time: 07/15/2022 12:30 PM  Performed by: Georga Hacking, MD Authorized by: Georga Hacking, MD   Consent:    Consent obtained:  Verbal  Risks discussed:  Infection, pain and poor cosmetic result Universal protocol:    Patient identity confirmed:  Verbally with patient Anesthesia:    Anesthesia method:  Nerve block   Block anesthetic:  Lidocaine 1% w/o epi   Block technique:  Auricular   Block injection procedure:  Anatomic landmarks identified and negative aspiration for blood   Block outcome:  Anesthesia achieved Laceration details:    Location:  Ear   Ear location:  L ear   Length (cm):  6 Pre-procedure details:    Preparation:  Patient was prepped and draped in usual sterile fashion Exploration:    Limited defect created (wound extended): no     Wound extent: no foreign body     Contaminated: no   Treatment:    Area cleansed with:  Saline   Amount of cleaning:  Standard   Irrigation solution:  Sterile saline   Irrigation method:  Syringe   Visualized foreign bodies/material removed: no     Debridement:  None   Undermining:  None   Layers/structures repaired:  Deep dermal/superficial fascia Deep dermal/superficial fascia:     Suture size:  6-0   Suture material:  Vicryl   Suture technique:  Simple interrupted   Number of sutures:  1 Skin repair:    Repair method:  Sutures   Suture size:  5-0   Suture material:  Nylon   Suture technique:  Simple interrupted   Number of sutures:  14 Approximation:    Approximation:  Close Repair type:    Repair type:  Intermediate Post-procedure details:    Dressing:  Open (no dressing)   Procedure completion:  Tolerated .Marland KitchenLaceration Repair  Date/Time: 07/15/2022 12:31 PM  Performed by: Georga Hacking, MD Authorized by: Georga Hacking, MD   Consent:    Consent obtained:  Verbal   Risks discussed:  Infection, pain and poor cosmetic result Universal protocol:    Patient identity confirmed:  Verbally with patient Anesthesia:    Anesthesia method:  Local infiltration   Local anesthetic:  Lidocaine 1% w/o epi Laceration details:    Location:  Scalp   Scalp location:  Occipital   Length (cm):  3 Pre-procedure details:    Preparation:  Patient was prepped and draped in usual sterile fashion Treatment:    Area cleansed with:  Saline   Amount of cleaning:  Standard   Irrigation solution:  Sterile saline   Irrigation method:  Syringe   Visualized foreign bodies/material removed: no     Debridement:  None   Undermining:  None Skin repair:    Repair method:  Staples   Number of staples:  4 Approximation:    Approximation:  Close Repair type:    Repair type:  Simple Post-procedure details:    Dressing:  Open (no dressing)   The patient is on the cardiac monitor to evaluate for evidence of arrhythmia and/or significant heart rate changes.   MEDICATIONS ORDERED IN ED: Medications  Tdap (BOOSTRIX) injection 0.5 mL (has no administration in time range)  lidocaine (PF) (XYLOCAINE) 1 % injection 5 mL (has no administration in time range)  lidocaine (PF) (XYLOCAINE) 1 % injection 5 mL (has no administration in time range)     IMPRESSION / MDM /  ASSESSMENT AND PLAN / ED COURSE  I reviewed the triage vital signs and the nursing notes.  Patient's presentation is most consistent with acute presentation with potential threat to life or bodily function.  Differential diagnosis includes, but is not limited to, skull fracture, intracranial hemorrhage, cervical spine fracture, concussion  82 year old male with history of Parkinson disease and frequent falls who presents after fall today.  He was at church in the bathroom when he stepped backward and fell backward hitting his head.  He did not lose consciousness.  He is not on blood thinners.  He did not syncopize.  Has had frequent falls similar nature to this.  Patient's vital signs are stable.  On exam he is alert and oriented neurologic exam is nonfocal.  He does have a complex laceration to the lobule of his ear which is almost completely avulsed but is hanging on by a small piece of skin.  Also has a scalp laceration.  No C-spine tenderness.  He was able to ambulate with a walker he is unsteady in the bathroom when he has to go of the walker which is his baseline.  The head and C-spine did not have any acute abnormalities.  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  He will need outpatient imaging to further characterize this.  I did repair his ear using 1 deep suture and superficial sutures.  Fortunately the laceration did not involve the cartilage did not feel that he needs antibiotic prophylaxis.  The laceration on his scalp was closed with staples.  Discussed findings concerning for infection with family.  Tetanus was updated.  Patient is to see his neurologist next week and recommended they discuss any necessary medication adjustments.  I also strongly encouraged them to reinvestigate physical therapy as I do think this would be beneficial for him to work on his strength and ambulation and  balance.       FINAL CLINICAL IMPRESSION(S) / ED DIAGNOSES   Final diagnoses:  Fall, initial encounter  Laceration of left earlobe, initial encounter  Laceration of scalp, initial encounter     Rx / DC Orders   ED Discharge Orders     None        Note:  This document was prepared using Dragon voice recognition software and may include unintentional dictation errors.   Georga Hacking, MD 07/15/22 1235

## 2022-07-15 NOTE — ED Notes (Signed)
Dr. Sidney Ace at bedside, repairing wound on left ear

## 2022-07-15 NOTE — ED Notes (Signed)
See triage note  Presents s/p  fall  States he hit his head on counter  Laceration to head   No LOC  Also having some neck pain

## 2022-07-15 NOTE — ED Notes (Signed)
Patient was assisted to hallway bathroom by male RN Molli Hazard, who reported that patient is unsteady while standing and "knees buckle." Dr. Sidney Ace aware.

## 2022-07-15 NOTE — ED Triage Notes (Signed)
Patient states he took a step back and fell. Patient states he hit the left side of his head on a counter top. Patient states he did not lose consciousness. Patient denies being on blood thinners and has a history of Parkinson's.

## 2022-07-16 DIAGNOSIS — G20C Parkinsonism, unspecified: Secondary | ICD-10-CM | POA: Diagnosis not present

## 2022-07-16 DIAGNOSIS — Q6672 Congenital pes cavus, left foot: Secondary | ICD-10-CM | POA: Diagnosis not present

## 2022-07-16 DIAGNOSIS — L97521 Non-pressure chronic ulcer of other part of left foot limited to breakdown of skin: Secondary | ICD-10-CM | POA: Diagnosis not present

## 2022-07-20 ENCOUNTER — Telehealth: Payer: Self-pay

## 2022-07-20 NOTE — Telephone Encounter (Signed)
Transition Care Management Unsuccessful Follow-up Telephone Call  Date of discharge and from where:  07/15/2022 [facility]  Attempts:  1st Attempt  Reason for unsuccessful TCM follow-up call:  Left voice message  Kesi Perrow [facility]  [facility] Population Health Community Resource Care Guide   [email or web address]  ?? [phone]   Website: [email or web address]

## 2022-07-23 ENCOUNTER — Telehealth: Payer: Self-pay

## 2022-07-23 NOTE — Telephone Encounter (Signed)
Transition Care Management Unsuccessful Follow-up Telephone Call  Date of discharge and from where:  07/15/2022 [facility]  Attempts:  2nd Attempt  Reason for unsuccessful TCM follow-up call:  Left voice message  Cristo Ausburn [facility]  [facility] Population Health Community Resource Care Guide   [email or web address]  ?? [phone]   Website: [email or web address]  [email or web address]

## 2022-07-24 DIAGNOSIS — S01312D Laceration without foreign body of left ear, subsequent encounter: Secondary | ICD-10-CM | POA: Diagnosis not present

## 2022-07-24 DIAGNOSIS — S0101XD Laceration without foreign body of scalp, subsequent encounter: Secondary | ICD-10-CM | POA: Diagnosis not present

## 2022-07-24 DIAGNOSIS — Z4802 Encounter for removal of sutures: Secondary | ICD-10-CM | POA: Diagnosis not present

## 2022-07-24 DIAGNOSIS — W19XXXD Unspecified fall, subsequent encounter: Secondary | ICD-10-CM | POA: Diagnosis not present

## 2022-07-25 DIAGNOSIS — E569 Vitamin deficiency, unspecified: Secondary | ICD-10-CM | POA: Diagnosis not present

## 2022-07-25 DIAGNOSIS — G20C Parkinsonism, unspecified: Secondary | ICD-10-CM | POA: Diagnosis not present

## 2022-07-25 DIAGNOSIS — H6121 Impacted cerumen, right ear: Secondary | ICD-10-CM | POA: Diagnosis not present

## 2022-07-25 DIAGNOSIS — Z79899 Other long term (current) drug therapy: Secondary | ICD-10-CM | POA: Diagnosis not present

## 2022-07-25 DIAGNOSIS — M21372 Foot drop, left foot: Secondary | ICD-10-CM | POA: Diagnosis not present

## 2022-08-20 DIAGNOSIS — Q6672 Congenital pes cavus, left foot: Secondary | ICD-10-CM | POA: Diagnosis not present

## 2022-08-20 DIAGNOSIS — S90415A Abrasion, left lesser toe(s), initial encounter: Secondary | ICD-10-CM | POA: Diagnosis not present

## 2022-08-20 DIAGNOSIS — L97521 Non-pressure chronic ulcer of other part of left foot limited to breakdown of skin: Secondary | ICD-10-CM | POA: Diagnosis not present

## 2022-08-28 ENCOUNTER — Ambulatory Visit: Payer: Medicare HMO | Attending: Neurology

## 2022-08-28 DIAGNOSIS — R2681 Unsteadiness on feet: Secondary | ICD-10-CM | POA: Insufficient documentation

## 2022-08-28 DIAGNOSIS — R2689 Other abnormalities of gait and mobility: Secondary | ICD-10-CM | POA: Diagnosis not present

## 2022-08-28 DIAGNOSIS — M6281 Muscle weakness (generalized): Secondary | ICD-10-CM | POA: Insufficient documentation

## 2022-08-28 NOTE — Therapy (Unsigned)
OUTPATIENT PHYSICAL THERAPY BALANCE EVALUATION   Patient Name: Daniel [name] MRN: [id number] DOB:08-Oct-1940, 82 y.o., male Today's Date: 08/29/2022  END OF SESSION:  PT End of Session - 08/29/22 1331     Visit Number 1    Date for PT Re-Evaluation 11/21/22    PT Start Time 1100    PT Stop Time 1145    PT Time Calculation (min) 45 min    Activity Tolerance Patient tolerated treatment well    Behavior During Therapy WFL for tasks assessed/performed            Past Medical History:  Diagnosis Date   AF (atrial fibrillation) (HCC)    B12 deficiency    Coronary artery disease    DH (dermatitis herpetiformis)    Dysphagia    Hypertension    Presence of permanent cardiac pacemaker    2006   Primary parkinsonism    Prostate cancer (HCC) 2014   Past Surgical History:  Procedure Laterality Date   APPENDECTOMY     CARDIAC CATHETERIZATION     CATARACT EXTRACTION W/PHACO Left 05/04/2020   Procedure: CATARACT EXTRACTION PHACO AND INTRAOCULAR LENS PLACEMENT (IOC) LEFT  6.76 00:59.9 11.3%;  Surgeon: Lockie Mola, MD;  Location: [facility];  Service: Ophthalmology;  Laterality: Left;   CATARACT EXTRACTION W/PHACO Right 05/18/2020   Procedure: CATARACT EXTRACTION PHACO AND INTRAOCULAR LENS PLACEMENT (IOC) RIGHT 15.56 01:52.6 13.8%;  Surgeon: Lockie Mola, MD;  Location: [facility];  Service: Ophthalmology;  Laterality: Right;   COLONOSCOPY  2008, 2014   CYSTOSCOPY WITH LITHOLAPAXY N/A 09/17/2019   Procedure: CYSTOSCOPY WITH LITHOLAPAXY;  Surgeon: Orson Ape, MD;  Location: [facility] OR[facility];  Service: Urology;  Laterality: N/A;   ESOPHAGOGASTRODUODENOSCOPY (EGD) WITH PROPOFOL N/A 10/13/2015   Procedure: ESOPHAGOGASTRODUODENOSCOPY (EGD) WITH PROPOFOL;  Surgeon: Christena Deem, MD;  Location: [facility] END[facility];  Service: Endoscopy;  Laterality: N/A;   FASCIOTOMY Left 01/30/2019   Procedure: I&D BELOW FASCIA FOOT SINGLE LEFT;  Surgeon: Gwyneth Revels, DPM;   Location: [facility] ORS;  Service: Podiatry;  Laterality: Left;   GREEN LIGHT LASER TURP (TRANSURETHRAL RESECTION OF PROSTATE N/A 09/17/2019   Procedure: GREEN LIGHT LASER TURP (TRANSURETHRAL RESECTION OF PROSTATE;  Surgeon: Orson Ape, MD;  Location: [facility] ORS;  Service: Urology;  Laterality: N/A;   PACEMAKER PLACEMENT  2006   There are no problems to display for this patient.   PCP: Jerl Mina, MD  REFERRING PROVIDER: Jerl Mina, MD   REFERRING DIAG: Primary Parkinsonism   RATIONALE FOR EVALUATION AND TREATMENT: Rehabilitation  THERAPY DIAG: Balance problem  ONSET DATE: 07/15/2022  FOLLOW-UP APPT SCHEDULED WITH REFERRING PROVIDER:  Didn't Address    SUBJECTIVE:  SUBJECTIVE STATEMENT:  Balance Impairment & Parkinson's  PERTINENT HISTORY:  Patient reports he's had a dozen falls within the last year; four occurred since his last visit to ED 07/15/2022. Reports spinning with bending over to perform laundry. He fell in his churches bathroom and hit his head. Patient had a laceration on his left ear which was repaired and his was discharged after further evaluation. Pt diagnosed with Parkinson's Disease. He goes to [employer]. He would like to be able to move without fear of falling & get out of bed without any help.    Pain: No Numbness/Tingling: Yes Numbness present BLE, below knee down to plantar aspect of the foot.  Focal Weakness: No Recent changes in overall health/medication: No Prior history of physical therapy for balance:  No Dominant hand: right  Imaging: Yes, Pt reports imaging negative for hematoma, hemorrhaging Red flags: Negative for bowel/bladder changes, saddle paresthesia, personal history of cancer, h/o spinal tumors, h/o compression fx, h/o abdominal aneurysm,  abdominal pain, chills/fever, night sweats, nausea, vomiting, unrelenting pain, first onset of insidious LBP <20 y/o  PRECAUTIONS: Fall  WEIGHT BEARING RESTRICTIONS: No FALLS: Has patient fallen in last 6 months? Yes. Number of falls 4 , Directional pattern for falls: Yes Backwards, turns and falls.  Living Environment Lives with: lives with their spouse Lives in: House/apartment Stairs: No Has following equipment at home: Single point cane and Environmental consultant - 2 wheeled  Prior level of function: Independent and Independent with household mobility with device  Occupational demands: Retired  Hobbies: TEFL teacher, [employer] 1x/wk  Patient Goals: He would like to be able to move without fear of falling & get out of bed without any help.   OBJECTIVE:   Patient Surveys  FOTO: 72, Predicted to improve 64 (Visit 19) ABC: Deferred  Cognition Patient is oriented to person, place, and time.  Recent memory is intact.  Remote memory is intact.  Attention span and concentration are intact.  Expressive speech is intact, pt speech is slightly muffled/hyphophonic.  Patient's fund of knowledge is within normal limits for educational level.   Gross Musculoskeletal Assessment Tremor: None Bulk: Normal Tone: Normal "Large mass" in posterior upper thoracic back; pt noted no pain to palpation. MD aware  Posture: Mildly kyphotic in thoracic spine  AROM AROM (Normal range in degrees) AROM   Lumbar   Flexion (65)   Extension (30)   Right lateral flexion (25)   Left lateral flexion (25)   Right rotation (30)   Left rotation (30)       Hip Right Left  Flexion (125)    Extension (15)    Abduction (40)    Adduction     Internal Rotation (45)    External Rotation (45)        Knee    Flexion (135)    Extension (0)        Ankle    Dorsiflexion (20)    Plantarflexion (50)    Inversion (35)    Eversion (15)    (* = pain; Blank rows = not tested)  LE MMT: MMT (out of 5)  Right  Left   Hip flexion 4+ 4+  Hip extension    Hip abduction    Hip adduction    Hip internal rotation 4 4  Hip external rotation 4 4  Knee flexion 4- 4-  Knee extension 4- 4-  Ankle dorsiflexion 4- 4-  Ankle plantarflexion    Ankle inversion  Ankle eversion    (* = pain; Blank rows = not tested)  Sensation Grossly intact to light touch throughout bilateral LEs as determined by testing dermatomes L2-S2. Proprioception, stereognosis, and hot/cold testing deferred on this date.  Sensation is slightly more Right > Left L4-5, S1   Reflexes Deferred  Cranial Nerves Deferred  Coordination/Cerebellar Finger to Nose: Slight Deviation from finger, more impaired noted on left more than right  Heel to Shin: WNL Rapid alternating movements: WNL Finger Opposition: WNL Pronator Drift: Negative  Bed mobility: Supine to sit CGA, pt reported assistance from spouse to return to seated position  Transfers: Assistive device utilized: Environmental consultant - 2 wheeled  Sit to stand: Min A, MinA for power up phase only  Stand to sit: CGA Chair to chair: Deferred Floor: Deferred  Curb:  Level of Assistance: Deferred Assistive device utilized: Deferred Curb Comments: Deferred  Stairs: Deferred  Gait: Gait pattern: step to pattern, decreased arm swing- Right, decreased arm swing- Left, decreased step length- Right, decreased step length- Left, and decreased stride length Distance walked: 30m Assistive device utilized: Walker - 2 wheeled Level of assistance: Modified independence Comments: Patient presents with decreased step length bilaterally;  Functional Outcome Measures  Results Comments  BERG    DGI    FGA    TUG 21.9 seconds Required verbal cues, assistance for transfer    5TSTS    6 Minute Walk Test    10 Meter Gait Speed Self-selected: 11.3s = 0.88 m/s; With front wheeled walker, slightly below full community ambulation speed  (Blank rows = not tested)  VESTIBULAR  TESTING Modified Dix-Hallpike R: negative, L: Negative   TODAY'S TREATMENT  Deferred  PATIENT EDUCATION:  Education details: Plan of Care  Person educated: Patient and Spouse Education method: Explanation Education comprehension: verbalized understanding   HOME EXERCISE PROGRAM:  Will provide next session.   ASSESSMENT:  CLINICAL IMPRESSION: Patient is a 83 y.o. male who was seen today for physical therapy evaluation and treatment for balance impairments and Parkinson. Patient lives in a level home with his spouse; uses a RW for household and community ambulation. Patient presents with poor balance, activity tolerance, endurance and LE strength. Pt reports history of falls with multiple admissions to ED. LE sensation impaired with slightly more normal sensation in R > L.  He reported that his falls are directional, typical LOB with backwards walking and turning. Pt demonstrated 21.9s on the TUG and 0.88 m/s on , indicating high fall risk. During TUG patient required multimodal instructions and assistance with power up from chair. Based on today's performance, PT recommends 1-2x/week of skilled physical therapy in order to address deficits in strength, balance, and mobility in order to return to full function at home.   OBJECTIVE IMPAIRMENTS: decreased activity tolerance, decreased balance, decreased coordination, decreased endurance, decreased mobility, difficulty walking, decreased ROM, decreased strength, and impaired sensation.   ACTIVITY LIMITATIONS: standing, squatting, stairs, transfers, and bed mobility  PARTICIPATION LIMITATIONS: laundry, driving, and community activity  PERSONAL FACTORS: Age, Fitness, Past/current experiences, and 3+ comorbidities: Atrial Fibrillation, CAD, Parkinson's,   are also affecting patient's functional outcome.   REHAB POTENTIAL: Good  CLINICAL DECISION MAKING: Evolving/moderate complexity  EVALUATION COMPLEXITY: High   GOALS: Goals  reviewed with patient? No  SHORT TERM GOALS: Target date:   Pt will be independent with HEP in order to improve strength and balance in order to decrease fall risk and improve function at home. Baseline:  Goal status: INITIAL   LONG TERM  GOALS: Target date: 11/21/2022   Pt will increase FOTO to at least 54 to demonstrate significant improvement in function at home related to balance  Baseline:  al status: INITIAL  2.  Pt will improve BERG by at least 3 points in order to demonstrate clinically significant improvement in balance.   Baseline: Deferred al status: INITIAL  3. Pt will decrease TUG to below 14 seconds/decrease in order to demonstrate decreased fall risk.  Baseline: Deferred al status: INITIAL  4. Pt will decrease 5TSTS by at least 3 seconds in order to demonstrate clinically significant improvement in LE strength Baseline: Deferred al status: INITIAL  5. Pt will improve ABC by at least 13% in order to demonstrate clinically significant improvement in balance confidence. Baseline: Deferred al status: INITIAL  PLAN: PT FREQUENCY: 2x/week  PT DURATION: 8 weeks  PLANNED INTERVENTIONS: Therapeutic exercises, Therapeutic activity, Neuromuscular re-education, Balance training, Gait training, Patient/Family education, Self Care, Joint mobilization, Joint manipulation, Vestibular training, Canalith repositioning, Orthotic/Fit training, DME instructions, Dry Needling, Electrical stimulation, Spinal manipulation, Spinal mobilization, Cryotherapy, Moist heat, Taping, Traction, Ultrasound, Ionotophoresis 4mg /ml Dexamethasone, Manual therapy, and Re-evaluation.  PLAN FOR NEXT SESSION: BERG, 5XSTS, ABC, LE ROM, Progress strength and balance program.    Sharalyn Ink Huprich PT, DPT, GCS   , SPT

## 2022-08-29 NOTE — Addendum Note (Signed)
Addended by: Ria Comment D on: 08/29/2022 02:17 PM   Modules accepted: Orders

## 2022-08-30 ENCOUNTER — Ambulatory Visit: Payer: Medicare HMO

## 2022-08-30 DIAGNOSIS — R2689 Other abnormalities of gait and mobility: Secondary | ICD-10-CM

## 2022-08-30 DIAGNOSIS — R2681 Unsteadiness on feet: Secondary | ICD-10-CM | POA: Diagnosis not present

## 2022-08-30 DIAGNOSIS — M6281 Muscle weakness (generalized): Secondary | ICD-10-CM

## 2022-08-30 NOTE — Therapy (Addendum)
OUTPATIENT PHYSICAL THERAPY BALANCE EVALUATION   Patient Name: RAHEIM KOSMATKA MRN: [id number] DOB:1940/07/05, 82 y.o., male Today's Date: 08/30/2022  END OF SESSION:  PT End of Session - 08/30/22 1217     Visit Number 2    PT Start Time 1100    PT Stop Time 1145    PT Time Calculation (min) 45 min    Equipment Utilized During Treatment Gait belt    Activity Tolerance Patient tolerated treatment well    Behavior During Therapy WFL for tasks assessed/performed            Past Medical History:  Diagnosis Date   AF (atrial fibrillation) (HCC)    B12 deficiency    Coronary artery disease    DH (dermatitis herpetiformis)    Dysphagia    Hypertension    Presence of permanent cardiac pacemaker    2006   Primary parkinsonism    Prostate cancer (HCC) 2014   Past Surgical History:  Procedure Laterality Date   APPENDECTOMY     CARDIAC CATHETERIZATION     CATARACT EXTRACTION W/PHACO Left 05/04/2020   Procedure: CATARACT EXTRACTION PHACO AND INTRAOCULAR LENS PLACEMENT (IOC) LEFT  6.76 00:59.9 11.3%;  Surgeon: Lockie Mola, MD;  Location: [facility];  Service: Ophthalmology;  Laterality: Left;   CATARACT EXTRACTION W/PHACO Right 05/18/2020   Procedure: CATARACT EXTRACTION PHACO AND INTRAOCULAR LENS PLACEMENT (IOC) RIGHT 15.56 01:52.6 13.8%;  Surgeon: Lockie Mola, MD;  Location: [facility];  Service: Ophthalmology;  Laterality: Right;   COLONOSCOPY  2008, 2014   CYSTOSCOPY WITH LITHOLAPAXY N/A 09/17/2019   Procedure: CYSTOSCOPY WITH LITHOLAPAXY;  Surgeon: Orson Ape, MD;  Location: [facility];  Service: Urology;  Laterality: N/A;   ESOPHAGOGASTRODUODENOSCOPY (EGD) WITH PROPOFOL N/A 10/13/2015   Procedure: ESOPHAGOGASTRODUODENOSCOPY (EGD) WITH PROPOFOL;  Surgeon: Christena Deem, MD;  Location: [facility];  Service: Endoscopy;  Laterality: N/A;   FASCIOTOMY Left 01/30/2019   Procedure: I&D BELOW FASCIA FOOT SINGLE LEFT;  Surgeon: Gwyneth Revels,  DPM;  Location: [facility];  Service: Podiatry;  Laterality: Left;   GREEN LIGHT LASER TURP (TRANSURETHRAL RESECTION OF PROSTATE N/A 09/17/2019   Procedure: GREEN LIGHT LASER TURP (TRANSURETHRAL RESECTION OF PROSTATE;  Surgeon: Orson Ape, MD;  Location: [facility];  Service: Urology;  Laterality: N/A;   PACEMAKER PLACEMENT  2006   There are no problems to display for this patient.   PCP: Jerl Mina, MD  REFERRING PROVIDER: Lonell Face, MD   REFERRING DIAG: Primary Parkinsonism   RATIONALE FOR EVALUATION AND TREATMENT: Rehabilitation  THERAPY DIAG: Imbalance  Muscle weakness (generalized)  Other abnormalities of gait and mobility  ONSET DATE: 07/15/2022  FOLLOW-UP APPT SCHEDULED WITH REFERRING PROVIDER:  Didn't Address    SUBJECTIVE:  SUBJECTIVE STATEMENT:  Balance Impairment & Parkinson's  PERTINENT HISTORY:  Patient reports he's had a dozen falls within the last year; four occurred since his last visit to ED 07/15/2022. Reports spinning with bending over to perform laundry. He fell in his churches bathroom and hit his head. Patient had a laceration on his left ear which was repaired and his was discharged after further evaluation. Pt diagnosed with Parkinson's Disease. He goes to [employer]. He would like to be able to move without fear of falling & get out of bed without any help.    Pain: No Numbness/Tingling: Yes Numbness present BLE, below knee down to plantar aspect of the foot.  Focal Weakness: No Recent changes in overall health/medication: No Prior history of physical therapy for balance:  No Dominant hand: right  Imaging: Yes, Pt reports imaging negative for hematoma, hemorrhaging Red flags: Negative for bowel/bladder changes, saddle paresthesia, personal history  of cancer, h/o spinal tumors, h/o compression fx, h/o abdominal aneurysm, abdominal pain, chills/fever, night sweats, nausea, vomiting, unrelenting pain, first onset of insidious LBP <20 y/o  PRECAUTIONS: Fall  WEIGHT BEARING RESTRICTIONS: No FALLS: Has patient fallen in last 6 months? Yes. Number of falls 4 , Directional pattern for falls: Yes Backwards, turns and falls.  Living Environment Lives with: lives with their spouse Lives in: House/apartment Stairs: No Has following equipment at home: Single point cane and Environmental consultant - 2 wheeled  Prior level of function: Independent and Independent with household mobility with device  Occupational demands: Retired  Hobbies: TEFL teacher, [employer] 1x/wk  Patient Goals: He would like to be able to move without fear of falling & get out of bed without any help.   OBJECTIVE:   Patient Surveys  FOTO: 30, Predicted to improve 64 (Visit 19) ABC: Deferred  Cognition Patient is oriented to person, place, and time.  Recent memory is intact.  Remote memory is intact.  Attention span and concentration are intact.  Expressive speech is intact, pt speech is slightly muffled/hyphophonic.  Patient's fund of knowledge is within normal limits for educational level.   Gross Musculoskeletal Assessment Tremor: None Bulk: Normal Tone: Normal "Large mass" in posterior upper thoracic back; pt noted no pain to palpation. MD aware  Posture: Mildly kyphotic in thoracic spine  AROM AROM (Normal range in degrees) AROM   Lumbar   Flexion (65)   Extension (30)   Right lateral flexion (25)   Left lateral flexion (25)   Right rotation (30)   Left rotation (30)       Hip Right Left  Flexion (125)    Extension (15)    Abduction (40)    Adduction     Internal Rotation (45)    External Rotation (45)        Knee    Flexion (135)    Extension (0)        Ankle    Dorsiflexion (20)    Plantarflexion (50)    Inversion (35)    Eversion  (15)    (* = pain; Blank rows = not tested)  LE MMT: MMT (out of 5) Right  Left   Hip flexion 4+ 4+  Hip extension    Hip abduction    Hip adduction    Hip internal rotation 4 4  Hip external rotation 4 4  Knee flexion 4- 4-  Knee extension 4- 4-  Ankle dorsiflexion 4- 4-  Ankle plantarflexion    Ankle inversion  Ankle eversion    (* = pain; Blank rows = not tested)  Sensation Grossly intact to light touch throughout bilateral LEs as determined by testing dermatomes L2-S2. Proprioception, stereognosis, and hot/cold testing deferred on this date.  Sensation is slightly more Right > Left L4-5, S1   Reflexes Deferred  Cranial Nerves Deferred  Coordination/Cerebellar Finger to Nose: Slight Deviation from finger, more impaired noted on left more than right  Heel to Shin: WNL Rapid alternating movements: WNL Finger Opposition: WNL Pronator Drift: Negative  Bed mobility: Supine to sit CGA, pt reported assistance from spouse to return to seated position  Transfers: Assistive device utilized: Environmental consultant - 2 wheeled  Sit to stand: Min A, MinA for power up phase only  Stand to sit: CGA Chair to chair: Deferred Floor: Deferred  Curb:  Level of Assistance: Deferred Assistive device utilized: Deferred Curb Comments: Deferred  Stairs: Deferred  Gait: Gait pattern: step to pattern, decreased arm swing- Right, decreased arm swing- Left, decreased step length- Right, decreased step length- Left, and decreased stride length Distance walked: 62m Assistive device utilized: Walker - 2 wheeled Level of assistance: Modified independence Comments: Patient presents with decreased step length bilaterally;  Functional Outcome Measures  Results Comments  BERG    DGI    FGA    TUG 21.9 seconds Required verbal cues, assistance for transfer    5TSTS    6 Minute Walk Test    10 Meter Gait Speed Self-selected: 11.3s = 0.88 m/s; With front wheeled walker, slightly below full  community ambulation speed  (Blank rows = not tested)  VESTIBULAR TESTING Modified Dix-Hallpike R: negative, L: Negative   TODAY'S TREATMENT   SUBJECTIVE: Patient reported feeling well today. No questions or concerns.   PAIN: Denies pain    There-ex Sit to Stands 2 x 10; Standing Marches with 3# AW 2 x 10; Step Ups with 3# AW 2 x 10;  Neuromuscular Re-ed  5XSTS: 26s BERG: 27/56  ABC: 63%  Stepping Over 6" Hurdles in // bars with 3# AW, 4 x multiple lengths; Airex Static Balance 3 x 10s;  Stepping on Airex 2 x 10;  Static Stance, Level Surface with Head Turns (HT) 2 x 15s; Walking in // bars with HT 2 x multiple lengths;   PATIENT EDUCATION:  Education details: Plan of Care  Person educated: Patient and Spouse Education method: Explanation Education comprehension: verbalized understanding   HOME EXERCISE PROGRAM:  Access Code: [email or web address] URL: [email or web address] Date: 08/31/2022 Prepared by: Ria Comment  Exercises - Sit to Stand with Counter Support  - 1 x daily - 7 x weekly - 3 sets - 10 reps - Standing March with Counter Support  - 1 x daily - 7 x weekly - 3 sets - 10 reps - Standing Romberg to 1/2 Tandem Stance  - 1 x daily - 7 x weekly - 3 x 30s with each foot forward hold   ASSESSMENT:  CLINICAL IMPRESSION: Patient arrived to physical therapy for his first follow-up with no new reports. Today's session focused on outcome measures, progressing balance, and strengthening exercises. Patient continues to present with balance impairments with a posterior postural sway when standing from chair. Patient reported 63% on ABC, demonstrated 26s 5XSTS, and 27/56 on BERG; indicating high fall risk. PT educated patient on HEP. Patient remains highly motivated to improve balance and strength. Pt will continue to benefit from skilled physical therapy to address deficits in strength, balance, and mobility in order to return to full function  at home.  OBJECTIVE  IMPAIRMENTS: decreased activity tolerance, decreased balance, decreased coordination, decreased endurance, decreased mobility, difficulty walking, decreased ROM, decreased strength, and impaired sensation.   ACTIVITY LIMITATIONS: standing, squatting, stairs, transfers, and bed mobility  PARTICIPATION LIMITATIONS: laundry, driving, and community activity  PERSONAL FACTORS: Age, Fitness, Past/current experiences, and 3+ comorbidities: Atrial Fibrillation, CAD, Parkinson's,   are also affecting patient's functional outcome.   REHAB POTENTIAL: od  CLINICAL DECISION MAKING: Evolving/moderate complexity  EVALUATION COMPLEXITY: High   GOALS: als reviewed with patient? No  SHORT TERM GOALS: Target date:   Pt will be independent with HEP in order to improve strength and balance in order to decrease fall risk and improve function at home. Baseline:  al status: INITIAL   LONG TERM GOALS: Target date: 11/22/2022   Pt will increase FOTO to at least 64 to demonstrate significant improvement in function at home related to balance  Baseline: 08/08: 54 al status: INITIAL  2.  Pt will improve BERG by at least 3 points in order to demonstrate clinically significant improvement in balance.   Baseline: 08/08: 27; al status: INITIAL  3. Pt will decrease TUG to below 14 seconds/decrease in order to demonstrate decreased fall risk.  Baseline: 08/06: 21.9; al status: INITIAL  4. Pt will decrease 5TSTS by at least 3 seconds in order to demonstrate clinically significant improvement in LE strength Baseline: 08/08: 26s; al status: INITIAL  5. Pt will improve ABC by at least 13% in order to demonstrate clinically significant improvement in balance confidence. Baseline: 08/08: 63% al status: INITIAL  PLAN: PT FREQUENCY: 2x/week  PT DURATION: 8 weeks  PLANNED INTERVENTIONS: Therapeutic exercises, Therapeutic activity, Neuromuscular re-education, Balance training, Gait training,  Patient/Family education, Self Care, Joint mobilization, Joint manipulation, Vestibular training, Canalith repositioning, Orthotic/Fit training, DME instructions, Dry Needling, Electrical stimulation, Spinal manipulation, Spinal mobilization, Cryotherapy, Moist heat, Taping, Traction, Ultrasound, Ionotophoresis 4mg /ml Dexamethasone, Manual therapy, and Re-evaluation.  PLAN FOR NEXT SESSION:  Continue progressing strength and balance program. Review and modify HEP   Jason D Huprich PT, DPT, GCS   , SPT

## 2022-09-04 ENCOUNTER — Ambulatory Visit: Payer: Medicare HMO

## 2022-09-04 DIAGNOSIS — R2689 Other abnormalities of gait and mobility: Secondary | ICD-10-CM

## 2022-09-04 DIAGNOSIS — R2681 Unsteadiness on feet: Secondary | ICD-10-CM | POA: Diagnosis not present

## 2022-09-04 DIAGNOSIS — M6281 Muscle weakness (generalized): Secondary | ICD-10-CM | POA: Diagnosis not present

## 2022-09-04 NOTE — Therapy (Incomplete)
OUTPATIENT PHYSICAL THERAPY BALANCE TREATMENT  Patient Name: Daniel [name] MRN: [id number] DOB:October 20, 1940, 82 y.o., male Today's Date: 09/06/2022  END OF SESSION:  PT End of Session - 09/06/22 0758     Visit Number 3    Number of Visits 25    Date for PT Re-Evaluation 11/20/22    Authorization Type eval: 08/28/22    PT Start Time 1100    PT Stop Time 1145    PT Time Calculation (min) 45 min    Equipment Utilized During Treatment Gait belt    Activity Tolerance Patient tolerated treatment well    Behavior During Therapy WFL for tasks assessed/performed            Past Medical History:  Diagnosis Date   AF (atrial fibrillation) (HCC)    B12 deficiency    Coronary artery disease    DH (dermatitis herpetiformis)    Dysphagia    Hypertension    Presence of permanent cardiac pacemaker    2006   Primary parkinsonism    Prostate cancer (HCC) 2014   Past Surgical History:  Procedure Laterality Date   APPENDECTOMY     CARDIAC CATHETERIZATION     CATARACT EXTRACTION W/PHACO Left 05/04/2020   Procedure: CATARACT EXTRACTION PHACO AND INTRAOCULAR LENS PLACEMENT (IOC) LEFT  6.76 00:59.9 11.3%;  Surgeon: Lockie Mola, MD;  Location: [facility];  Service: Ophthalmology;  Laterality: Left;   CATARACT EXTRACTION W/PHACO Right 05/18/2020   Procedure: CATARACT EXTRACTION PHACO AND INTRAOCULAR LENS PLACEMENT (IOC) RIGHT 15.56 01:52.6 13.8%;  Surgeon: Lockie Mola, MD;  Location: [facility];  Service: Ophthalmology;  Laterality: Right;   COLONOSCOPY  2008, 2014   CYSTOSCOPY WITH LITHOLAPAXY N/A 09/17/2019   Procedure: CYSTOSCOPY WITH LITHOLAPAXY;  Surgeon: Orson Ape, MD;  Location: [facility];  Service: Urology;  Laterality: N/A;   ESOPHAGOGASTRODUODENOSCOPY (EGD) WITH PROPOFOL N/A 10/13/2015   Procedure: ESOPHAGOGASTRODUODENOSCOPY (EGD) WITH PROPOFOL;  Surgeon: Christena Deem, MD;  Location: [facility];  Service: Endoscopy;  Laterality: N/A;    FASCIOTOMY Left 01/30/2019   Procedure: I&D BELOW FASCIA FOOT SINGLE LEFT;  Surgeon: Gwyneth Revels, DPM;  Location: [facility];  Service: Podiatry;  Laterality: Left;   GREEN LIGHT LASER TURP (TRANSURETHRAL RESECTION OF PROSTATE N/A 09/17/2019   Procedure: GREEN LIGHT LASER TURP (TRANSURETHRAL RESECTION OF PROSTATE;  Surgeon: Orson Ape, MD;  Location: [facility];  Service: Urology;  Laterality: N/A;   PACEMAKER PLACEMENT  2006   There are no problems to display for this patient.   PCP: Jerl Mina, MD  REFERRING PROVIDER: Lonell Face, MD   REFERRING DIAG: Primary Parkinsonism   RATIONALE FOR EVALUATION AND TREATMENT: Rehabilitation  THERAPY DIAG: Imbalance  Muscle weakness (generalized)  Balance problem  ONSET DATE: 07/15/2022  FOLLOW-UP APPT SCHEDULED WITH REFERRING PROVIDER:  Didn't Address    SUBJECTIVE:  SUBJECTIVE STATEMENT:  Balance Impairment & Parkinson's  PERTINENT HISTORY:  Patient reports he's had a dozen falls within the last year; four occurred since his last visit to ED 07/15/2022. Reports spinning with bending over to perform laundry. He fell in his churches bathroom and hit his head. Patient had a laceration on his left ear which was repaired and his was discharged after further evaluation. Pt diagnosed with Parkinson's Disease. He goes to [employer]. He would like to be able to move without fear of falling & get out of bed without any help.    Pain: No Numbness/Tingling: Yes Numbness present BLE, below knee down to plantar aspect of the foot.  Focal Weakness: No Recent changes in overall health/medication: No Prior history of physical therapy for balance:  No Dominant hand: right  Imaging: Yes, Pt reports imaging negative for hematoma, hemorrhaging Red  flags: Negative for bowel/bladder changes, saddle paresthesia, personal history of cancer, h/o spinal tumors, h/o compression fx, h/o abdominal aneurysm, abdominal pain, chills/fever, night sweats, nausea, vomiting, unrelenting pain, first onset of insidious LBP <20 y/o  PRECAUTIONS: Fall  WEIGHT BEARING RESTRICTIONS: No FALLS: Has patient fallen in last 6 months? Yes. Number of falls 4 , Directional pattern for falls: Yes Backwards, turns and falls.  Living Environment Lives with: lives with their spouse Lives in: House/apartment Stairs: No Has following equipment at home: Single point cane and Environmental consultant - 2 wheeled  Prior level of function: Independent and Independent with household mobility with device  Occupational demands: Retired  Hobbies: TEFL teacher, [employer] 1x/wk  Patient Goals: He would like to be able to move without fear of falling & get out of bed without any help.   OBJECTIVE:   Patient Surveys  FOTO: 44, Predicted to improve 64 (Visit 19) ABC: Deferred  Cognition Patient is oriented to person, place, and time.  Recent memory is intact.  Remote memory is intact.  Attention span and concentration are intact.  Expressive speech is intact, pt speech is slightly muffled/hyphophonic.  Patient's fund of knowledge is within normal limits for educational level.   Gross Musculoskeletal Assessment Tremor: None Bulk: Normal Tone: Normal "Large mass" in posterior upper thoracic back; pt noted no pain to palpation. MD aware  Posture: Mildly kyphotic in thoracic spine  AROM AROM (Normal range in degrees) AROM   Lumbar   Flexion (65)   Extension (30)   Right lateral flexion (25)   Left lateral flexion (25)   Right rotation (30)   Left rotation (30)       Hip Right Left  Flexion (125)    Extension (15)    Abduction (40)    Adduction     Internal Rotation (45)    External Rotation (45)        Knee    Flexion (135)    Extension (0)        Ankle     Dorsiflexion (20)    Plantarflexion (50)    Inversion (35)    Eversion (15)    (* = pain; Blank rows = not tested)  LE MMT: MMT (out of 5) Right  Left   Hip flexion 4+ 4+  Hip extension    Hip abduction    Hip adduction    Hip internal rotation 4 4  Hip external rotation 4 4  Knee flexion 4- 4-  Knee extension 4- 4-  Ankle dorsiflexion 4- 4-  Ankle plantarflexion    Ankle inversion  Ankle eversion    (* = pain; Blank rows = not tested)  Sensation Grossly intact to light touch throughout bilateral LEs as determined by testing dermatomes L2-S2. Proprioception, stereognosis, and hot/cold testing deferred on this date.  Sensation is slightly more Right > Left L4-5, S1   Reflexes Deferred  Cranial Nerves Deferred  Coordination/Cerebellar Finger to Nose: Slight Deviation from finger, more impaired noted on left more than right  Heel to Shin: WNL Rapid alternating movements: WNL Finger Opposition: WNL Pronator Drift: Negative  Bed mobility: Supine to sit CGA, pt reported assistance from spouse to return to seated position  Transfers: Assistive device utilized: Environmental consultant - 2 wheeled  Sit to stand: Min A, MinA for power up phase only  Stand to sit: CGA Chair to chair: Deferred Floor: Deferred  Curb:  Level of Assistance: Deferred Assistive device utilized: Deferred Curb Comments: Deferred  Stairs: Deferred  Gait: Gait pattern: step to pattern, decreased arm swing- Right, decreased arm swing- Left, decreased step length- Right, decreased step length- Left, and decreased stride length Distance walked: 5m Assistive device utilized: Walker - 2 wheeled Level of assistance: Modified independence Comments: Patient presents with decreased step length bilaterally;  Functional Outcome Measures  Results Comments  BERG    DGI    FGA    TUG 21.9 seconds Required verbal cues, assistance for transfer    5TSTS    6 Minute Walk Test    10 Meter Gait Speed  Self-selected: 11.3s = 0.88 m/s; With front wheeled walker, slightly below full community ambulation speed  (Blank rows = not tested)  VESTIBULAR TESTING Modified Dix-Hallpike R: negative, L: Negative    TODAY'S TREATMENT    SUBJECTIVE: Patient reported feeling well today. Patient reports the HEP is going well.  No questions or concerns.    PAIN: Denies pain    There-ex NuStep Level 1-4 x 8 Min for warmup and interval history; Sit to Stands 2 x 10 with 6# Medicine Ball (MB); Seated Long Arc Quads 2 x 10 5# Ankle Weight (AW); Resisted Forwards and Backwards Walking in // Bars, unsteady with backwards walking;  Step Ups with Claps BLE 2 x 10s;   Neuromuscular Re-ed  Sit to Stands with UE support on Airex 1 x 10; Airex Static Balance Progression 3 x 10s;   Airex Rhomberg 3 x 10s ; Semi-Tandem Stance Level Ground 3 x 10s ; Semi-Tandem with Reaching task BLE in front 2 x multiple lengths; Sitting with Large Amplitude stomp and clap 2 x 10 BLE  Not Performed: Standing Marches with 3# AW 2 x 10; Step Ups with 3# AW 2 x 10; Stepping on Airex 2 x 10;  Stepping Over 6" Hurdles in // bars with 3# AW, 4 x multiple lengths; Static Stance, Level Surface with Head Turns (HT) 2 x 15s; Walking in // bars with HT 2 x multiple lengths;    PATIENT EDUCATION:  Education details: Plan of Care  Person educated: Patient and Spouse Education method: Explanation Education comprehension: verbalized understanding   HOME EXERCISE PROGRAM:  Access Code: [email or web address] URL: [email or web address] Date: 08/31/2022 Prepared by: Ria Comment  Exercises - Sit to Stand with Counter Support  - 1 x daily - 7 x weekly - 3 sets - 10 reps - Standing March with Counter Support  - 1 x daily - 7 x weekly - 3 sets - 10 reps - Standing Romberg to 1/2 Tandem Stance  - 1 x daily - 7 x weekly - 3 x 30s  with each foot forward hold   ASSESSMENT:  CLINICAL IMPRESSION: Patient arrived to physical  therapy for his first follow-up with no new reports. Patient tolerated higher balance level activities without bouts of LOB. Patient has demonstrated minor improvements with sit to stand transfers; able to perform on first attempt with BUE support. Pt is still challenged with ambulation and head turns; presents with posterior sway with left head turn. No updates to HEP today. PT plan to continue progressing static and dynamic balance activities. Pt will continue to benefit from skilled physical therapy to address deficits in strength, balance, and mobility in order to return to full function at home.  OBJECTIVE IMPAIRMENTS: decreased activity tolerance, decreased balance, decreased coordination, decreased endurance, decreased mobility, difficulty walking, decreased ROM, decreased strength, and impaired sensation.   ACTIVITY LIMITATIONS: standing, squatting, stairs, transfers, and bed mobility  PARTICIPATION LIMITATIONS: laundry, driving, and community activity  PERSONAL FACTORS: Age, Fitness, Past/current experiences, and 3+ comorbidities: Atrial Fibrillation, CAD, Parkinson's,   are also affecting patient's functional outcome.   REHAB POTENTIAL: od  CLINICAL DECISION MAKING: Evolving/moderate complexity  EVALUATION COMPLEXITY: High   GOALS: als reviewed with patient? No  SHORT TERM GOALS: Target date:   Pt will be independent with HEP in order to improve strength and balance in order to decrease fall risk and improve function at home. Baseline:  al status: INITIAL   LONG TERM GOALS: Target date: 11/29/2022   Pt will increase FOTO to at least 64 to demonstrate significant improvement in function at home related to balance  Baseline: 08/08: 54 al status: INITIAL  2.  Pt will improve BERG by at least 3 points in order to demonstrate clinically significant improvement in balance.   Baseline: 08/08: 27; al status: INITIAL  3. Pt will decrease TUG to below 14 seconds/decrease  in order to demonstrate decreased fall risk.  Baseline: 08/06: 21.9; al status: INITIAL  4. Pt will decrease 5TSTS by at least 3 seconds in order to demonstrate clinically significant improvement in LE strength Baseline: 08/08: 26s; al status: INITIAL  5. Pt will improve ABC by at least 13% in order to demonstrate clinically significant improvement in balance confidence. Baseline: 08/08: 63% al status: INITIAL  PLAN: PT FREQUENCY: 2x/week  PT DURATION: 8 weeks  PLANNED INTERVENTIONS: Therapeutic exercises, Therapeutic activity, Neuromuscular re-education, Balance training, Gait training, Patient/Family education, Self Care, Joint mobilization, Joint manipulation, Vestibular training, Canalith repositioning, Orthotic/Fit training, DME instructions, Dry Needling, Electrical stimulation, Spinal manipulation, Spinal mobilization, Cryotherapy, Moist heat, Taping, Traction, Ultrasound, Ionotophoresis 4mg /ml Dexamethasone, Manual therapy, and Re-evaluation.  PLAN FOR NEXT SESSION:  Continue progressing strength and balance program. Review and modify HEP   Jason D Huprich PT, DPT, GCS   , SPT

## 2022-09-06 ENCOUNTER — Ambulatory Visit: Payer: Medicare HMO

## 2022-09-06 DIAGNOSIS — R2689 Other abnormalities of gait and mobility: Secondary | ICD-10-CM

## 2022-09-06 DIAGNOSIS — M6281 Muscle weakness (generalized): Secondary | ICD-10-CM | POA: Diagnosis not present

## 2022-09-06 DIAGNOSIS — R2681 Unsteadiness on feet: Secondary | ICD-10-CM | POA: Diagnosis not present

## 2022-09-06 NOTE — Therapy (Addendum)
OUTPATIENT PHYSICAL THERAPY BALANCE TREATMENT  Patient Name: WALLER MONTE MRN: [id number] DOB:10-19-1940, 82 y.o., male Today's Date: 09/06/2022  END OF SESSION:  PT End of Session - 09/06/22 1054     Visit Number 4    Number of Visits 25    Date for PT Re-Evaluation 11/20/22    Authorization Type eval: 08/28/22    PT Start Time 1055    PT Stop Time 1140    PT Time Calculation (min) 45 min    Equipment Utilized During Treatment Gait belt    Activity Tolerance Patient tolerated treatment well    Behavior During Therapy WFL for tasks assessed/performed            Past Medical History:  Diagnosis Date   AF (atrial fibrillation) (HCC)    B12 deficiency    Coronary artery disease    DH (dermatitis herpetiformis)    Dysphagia    Hypertension    Presence of permanent cardiac pacemaker    2006   Primary parkinsonism    Prostate cancer (HCC) 2014   Past Surgical History:  Procedure Laterality Date   APPENDECTOMY     CARDIAC CATHETERIZATION     CATARACT EXTRACTION W/PHACO Left 05/04/2020   Procedure: CATARACT EXTRACTION PHACO AND INTRAOCULAR LENS PLACEMENT (IOC) LEFT  6.76 00:59.9 11.3%;  Surgeon: Lockie Mola, MD;  Location: [facility];  Service: Ophthalmology;  Laterality: Left;   CATARACT EXTRACTION W/PHACO Right 05/18/2020   Procedure: CATARACT EXTRACTION PHACO AND INTRAOCULAR LENS PLACEMENT (IOC) RIGHT 15.56 01:52.6 13.8%;  Surgeon: Lockie Mola, MD;  Location: [facility];  Service: Ophthalmology;  Laterality: Right;   COLONOSCOPY  2008, 2014   CYSTOSCOPY WITH LITHOLAPAXY N/A 09/17/2019   Procedure: CYSTOSCOPY WITH LITHOLAPAXY;  Surgeon: Orson Ape, MD;  Location: [facility];  Service: Urology;  Laterality: N/A;   ESOPHAGOGASTRODUODENOSCOPY (EGD) WITH PROPOFOL N/A 10/13/2015   Procedure: ESOPHAGOGASTRODUODENOSCOPY (EGD) WITH PROPOFOL;  Surgeon: Christena Deem, MD;  Location: [facility];  Service: Endoscopy;  Laterality: N/A;    FASCIOTOMY Left 01/30/2019   Procedure: I&D BELOW FASCIA FOOT SINGLE LEFT;  Surgeon: Gwyneth Revels, DPM;  Location: [facility];  Service: Podiatry;  Laterality: Left;   GREEN LIGHT LASER TURP (TRANSURETHRAL RESECTION OF PROSTATE N/A 09/17/2019   Procedure: GREEN LIGHT LASER TURP (TRANSURETHRAL RESECTION OF PROSTATE;  Surgeon: Orson Ape, MD;  Location: [facility];  Service: Urology;  Laterality: N/A;   PACEMAKER PLACEMENT  2006   There are no problems to display for this patient.   PCP: Jerl Mina, MD  REFERRING PROVIDER: Lonell Face, MD   REFERRING DIAG: Primary Parkinsonism   RATIONALE FOR EVALUATION AND TREATMENT: Rehabilitation  THERAPY DIAG: Imbalance  Muscle weakness (generalized)  Balance problem  ONSET DATE: 07/15/2022  FOLLOW-UP APPT SCHEDULED WITH REFERRING PROVIDER:  Didn't Address    SUBJECTIVE:  SUBJECTIVE STATEMENT:  Balance Impairment & Parkinson's  PERTINENT HISTORY:  Patient reports he's had a dozen falls within the last year; four occurred since his last visit to ED 07/15/2022. Reports spinning with bending over to perform laundry. He fell in his churches bathroom and hit his head. Patient had a laceration on his left ear which was repaired and his was discharged after further evaluation. Pt diagnosed with Parkinson's Disease. He goes to [employer]. He would like to be able to move without fear of falling & get out of bed without any help.    Pain: No Numbness/Tingling: Yes Numbness present BLE, below knee down to plantar aspect of the foot.  Focal Weakness: No Recent changes in overall health/medication: No Prior history of physical therapy for balance:  No Dominant hand: right  Imaging: Yes, Pt reports imaging negative for hematoma, hemorrhaging Red  flags: Negative for bowel/bladder changes, saddle paresthesia, personal history of cancer, h/o spinal tumors, h/o compression fx, h/o abdominal aneurysm, abdominal pain, chills/fever, night sweats, nausea, vomiting, unrelenting pain, first onset of insidious LBP <20 y/o  PRECAUTIONS: Fall  WEIGHT BEARING RESTRICTIONS: No FALLS: Has patient fallen in last 6 months? Yes. Number of falls 4 , Directional pattern for falls: Yes Backwards, turns and falls.  Living Environment Lives with: lives with their spouse Lives in: House/apartment Stairs: No Has following equipment at home: Single point cane and Environmental consultant - 2 wheeled  Prior level of function: Independent and Independent with household mobility with device  Occupational demands: Retired  Hobbies: TEFL teacher, [employer] 1x/wk  Patient Goals: He would like to be able to move without fear of falling & get out of bed without any help.   OBJECTIVE:   Patient Surveys  FOTO: 19, Predicted to improve 64 (Visit 19) ABC: Deferred  Cognition Patient is oriented to person, place, and time.  Recent memory is intact.  Remote memory is intact.  Attention span and concentration are intact.  Expressive speech is intact, pt speech is slightly muffled/hyphophonic.  Patient's fund of knowledge is within normal limits for educational level.   Gross Musculoskeletal Assessment Tremor: None Bulk: Normal Tone: Normal "Large mass" in posterior upper thoracic back; pt noted no pain to palpation. MD aware  Posture: Mildly kyphotic in thoracic spine  AROM AROM (Normal range in degrees) AROM   Lumbar   Flexion (65)   Extension (30)   Right lateral flexion (25)   Left lateral flexion (25)   Right rotation (30)   Left rotation (30)       Hip Right Left  Flexion (125)    Extension (15)    Abduction (40)    Adduction     Internal Rotation (45)    External Rotation (45)        Knee    Flexion (135)    Extension (0)        Ankle     Dorsiflexion (20)    Plantarflexion (50)    Inversion (35)    Eversion (15)    (* = pain; Blank rows = not tested)  LE MMT: MMT (out of 5) Right  Left   Hip flexion 4+ 4+  Hip extension    Hip abduction    Hip adduction    Hip internal rotation 4 4  Hip external rotation 4 4  Knee flexion 4- 4-  Knee extension 4- 4-  Ankle dorsiflexion 4- 4-  Ankle plantarflexion    Ankle inversion  Ankle eversion    (* = pain; Blank rows = not tested)  Sensation Grossly intact to light touch throughout bilateral LEs as determined by testing dermatomes L2-S2. Proprioception, stereognosis, and hot/cold testing deferred on this date.  Sensation is slightly more Right > Left L4-5, S1   Reflexes Deferred  Cranial Nerves Deferred  Coordination/Cerebellar Finger to Nose: Slight Deviation from finger, more impaired noted on left more than right  Heel to Shin: WNL Rapid alternating movements: WNL Finger Opposition: WNL Pronator Drift: Negative  Bed mobility: Supine to sit CGA, pt reported assistance from spouse to return to seated position  Transfers: Assistive device utilized: Environmental consultant - 2 wheeled  Sit to stand: Min A, MinA for power up phase only  Stand to sit: CGA Chair to chair: Deferred Floor: Deferred  Curb:  Level of Assistance: Deferred Assistive device utilized: Deferred Curb Comments: Deferred  Stairs: Deferred  Gait: Gait pattern: step to pattern, decreased arm swing- Right, decreased arm swing- Left, decreased step length- Right, decreased step length- Left, and decreased stride length Distance walked: 34m Assistive device utilized: Walker - 2 wheeled Level of assistance: Modified independence Comments: Patient presents with decreased step length bilaterally;  Functional Outcome Measures  Results Comments  BERG    DGI    FGA    TUG 21.9 seconds Required verbal cues, assistance for transfer    5TSTS    6 Minute Walk Test    10 Meter Gait Speed  Self-selected: 11.3s = 0.88 m/s; With front wheeled walker, slightly below full community ambulation speed  (Blank rows = not tested)  VESTIBULAR TESTING Modified Dix-Hallpike R: negative, L: Negative    TODAY'S TREATMENT    SUBJECTIVE: Patient reported feeling well today. Patient reports adherence with HEP. No further questions or concerns.    PAIN: Denies Pain    There-ex NuStep Level 1-4 x 8 Min for warmup and interval history (Settings: Seat 15, Arm 13); Sit to Stands with Arms wide open 2 x 10; Large Stepping in agility ladder with Ankle Weights 3# x multiple lengths    Neuromuscular Re-ed  Static Parallel Stance with Horizontal Head Turns (HT); Walking with Horizontal HT x multiple lengths; Large 360 Turns Bilaterally x 5 ea direction; Walking with Weaving through 6 cones x 4;  Weaving Through 6 Cones with 6" hurdles x 4;  Weaving through 6 Cones with 12" hurdles x 4; Boxing with Stomping in agility ladder x 4;     Not Performed: Airex Static Balance Progression 3 x 10s;   Airex Rhomberg 3 x 10s; Resisted Forwards and Backwards Walking in // Bars, unsteady with backwards walking;  Seated Long Arc Quads 2 x 10 5# Ankle Weight (AW); Standing Marches with 3# AW 2 x 10; Step Ups with 3# AW 2 x 10; Step Ups with Claps BLE 2 x 10s; Stepping on Airex 2 x 10;  Stepping Over 6" Hurdles in // bars with 3# AW, 4 x multiple lengths; Semi-Tandem Stance Level Ground 3 x 10s ; Semi-Tandem with Reaching task BLE in front 2 x multiple lengths; Sitting with Large Amplitude stomp and clap 2 x 10 BLE Static Stance, Level Surface with Head Turns (HT) 2 x 15s; Walking in // bars with HT 2 x multiple lengths;   PATIENT EDUCATION:  Education details: Plan of Care  Person educated: Patient and Spouse Education method: Explanation Education comprehension: verbalized understanding   HOME EXERCISE PROGRAM:  Access Code: [email or web address] URL: [email or web address] Date:  08/31/2022 Prepared by: Ria Comment  Exercises - Sit to Stand with Counter Support  - 1 x daily - 7 x weekly - 3 sets - 10 reps - Standing March with Counter Support  - 1 x daily - 7 x weekly - 3 sets - 10 reps - Standing Romberg to 1/2 Tandem Stance  - 1 x daily - 7 x weekly - 3 x 30s with each foot forward hold   ASSESSMENT:  CLINICAL IMPRESSION: Patient arrived to physical therapy highly motivated. Pt demonstrated good power up with sit to stand however intermittent bouts of freezing with transfers. Educated patient on use of mental processing and verbal cues in order to mitigate freezing. Patient tolerated higher dynamic balance activities today; able to navigate weaving obstacle with one bout of minor LOB but able to self recover. Boxing activity presented with moderate unsteadiness and two bouts of left lateral sway. Verbal and tactile cues provided throughout session in order to maintain balance throughout session. No updates to HEP this session; pt endorsed improvements in balance.  PT plan to continue progressing static and dynamic balance activities. Pt will continue to benefit from skilled physical therapy to address deficits in strength, balance, and mobility in order to return to full function at home.  OBJECTIVE IMPAIRMENTS: decreased activity tolerance, decreased balance, decreased coordination, decreased endurance, decreased mobility, difficulty walking, decreased ROM, decreased strength, and impaired sensation.   ACTIVITY LIMITATIONS: standing, squatting, stairs, transfers, and bed mobility  PARTICIPATION LIMITATIONS: laundry, driving, and community activity  PERSONAL FACTORS: Age, Fitness, Past/current experiences, and 3+ comorbidities: Atrial Fibrillation, CAD, Parkinson's,   are also affecting patient's functional outcome.   REHAB POTENTIAL: od  CLINICAL DECISION MAKING: Evolving/moderate complexity  EVALUATION COMPLEXITY: High   GOALS: als reviewed with  patient? No  SHORT TERM GOALS: Target date:   Pt will be independent with HEP in order to improve strength and balance in order to decrease fall risk and improve function at home. Baseline:  al status: INITIAL   LONG TERM GOALS: Target date: 11/29/2022   Pt will increase FOTO to at least 64 to demonstrate significant improvement in function at home related to balance  Baseline: 08/08: 54 al status: INITIAL  2.  Pt will improve BERG by at least 3 points in order to demonstrate clinically significant improvement in balance.   Baseline: 08/08: 27; al status: INITIAL  3. Pt will decrease TUG to below 14 seconds/decrease in order to demonstrate decreased fall risk.  Baseline: 08/06: 21.9; al status: INITIAL  4. Pt will decrease 5TSTS by at least 3 seconds in order to demonstrate clinically significant improvement in LE strength Baseline: 08/08: 26s; al status: INITIAL  5. Pt will improve ABC by at least 13% in order to demonstrate clinically significant improvement in balance confidence. Baseline: 08/08: 63% al status: INITIAL  PLAN: PT FREQUENCY: 2x/week  PT DURATION: 8 weeks  PLANNED INTERVENTIONS: Therapeutic exercises, Therapeutic activity, Neuromuscular re-education, Balance training, Gait training, Patient/Family education, Self Care, Joint mobilization, Joint manipulation, Vestibular training, Canalith repositioning, Orthotic/Fit training, DME instructions, Dry Needling, Electrical stimulation, Spinal manipulation, Spinal mobilization, Cryotherapy, Moist heat, Taping, Traction, Ultrasound, Ionotophoresis 4mg /ml Dexamethasone, Manual therapy, and Re-evaluation.  PLAN FOR NEXT SESSION:  Continue progressing strength and balance program. Review and modify HEP   Jason D Huprich PT, DPT, GCS   , SPT

## 2022-09-11 ENCOUNTER — Ambulatory Visit: Payer: Medicare HMO

## 2022-09-11 DIAGNOSIS — R2681 Unsteadiness on feet: Secondary | ICD-10-CM | POA: Diagnosis not present

## 2022-09-11 DIAGNOSIS — M6281 Muscle weakness (generalized): Secondary | ICD-10-CM | POA: Diagnosis not present

## 2022-09-11 DIAGNOSIS — R2689 Other abnormalities of gait and mobility: Secondary | ICD-10-CM

## 2022-09-11 NOTE — Therapy (Addendum)
OUTPATIENT PHYSICAL THERAPY BALANCE TREATMENT  Patient Name: Daniel [name] MRN: [id number] DOB:Mar 21, 1940, 82 y.o., male Today's Date: 09/11/2022  END OF SESSION:  PT End of Session - 09/11/22 1100     Visit Number 5    Number of Visits 25    Date for PT Re-Evaluation 11/20/22    Authorization Type eval: 08/28/22    PT Start Time 1100    PT Stop Time 1145    PT Time Calculation (min) 45 min    Equipment Utilized During Treatment Gait belt    Activity Tolerance Patient tolerated treatment well    Behavior During Therapy WFL for tasks assessed/performed             Past Medical History:  Diagnosis Date   AF (atrial fibrillation) (HCC)    B12 deficiency    Coronary artery disease    DH (dermatitis herpetiformis)    Dysphagia    Hypertension    Presence of permanent cardiac pacemaker    2006   Primary parkinsonism    Prostate cancer (HCC) 2014   Past Surgical History:  Procedure Laterality Date   APPENDECTOMY     CARDIAC CATHETERIZATION     CATARACT EXTRACTION W/PHACO Left 05/04/2020   Procedure: CATARACT EXTRACTION PHACO AND INTRAOCULAR LENS PLACEMENT (IOC) LEFT  6.76 00:59.9 11.3%;  Surgeon: Lockie Mola, MD;  Location: [facility];  Service: Ophthalmology;  Laterality: Left;   CATARACT EXTRACTION W/PHACO Right 05/18/2020   Procedure: CATARACT EXTRACTION PHACO AND INTRAOCULAR LENS PLACEMENT (IOC) RIGHT 15.56 01:52.6 13.8%;  Surgeon: Lockie Mola, MD;  Location: [facility];  Service: Ophthalmology;  Laterality: Right;   COLONOSCOPY  2008, 2014   CYSTOSCOPY WITH LITHOLAPAXY N/A 09/17/2019   Procedure: CYSTOSCOPY WITH LITHOLAPAXY;  Surgeon: Orson Ape, MD;  Location: [facility];  Service: Urology;  Laterality: N/A;   ESOPHAGOGASTRODUODENOSCOPY (EGD) WITH PROPOFOL N/A 10/13/2015   Procedure: ESOPHAGOGASTRODUODENOSCOPY (EGD) WITH PROPOFOL;  Surgeon: Christena Deem, MD;  Location: [facility];  Service: Endoscopy;  Laterality: N/A;    FASCIOTOMY Left 01/30/2019   Procedure: I&D BELOW FASCIA FOOT SINGLE LEFT;  Surgeon: Gwyneth Revels, DPM;  Location: [facility];  Service: Podiatry;  Laterality: Left;   GREEN LIGHT LASER TURP (TRANSURETHRAL RESECTION OF PROSTATE N/A 09/17/2019   Procedure: GREEN LIGHT LASER TURP (TRANSURETHRAL RESECTION OF PROSTATE;  Surgeon: Orson Ape, MD;  Location: [facility];  Service: Urology;  Laterality: N/A;   PACEMAKER PLACEMENT  2006   There are no problems to display for this patient.   PCP: Jerl Mina, MD  REFERRING PROVIDER: Lonell Face, MD   REFERRING DIAG: Primary Parkinsonism   RATIONALE FOR EVALUATION AND TREATMENT: Rehabilitation  THERAPY DIAG: Muscle weakness (generalized)  Other abnormalities of gait and mobility  Unsteadiness on feet  ONSET DATE: 07/15/2022  FOLLOW-UP APPT SCHEDULED WITH REFERRING PROVIDER:  Didn't Address    SUBJECTIVE:  SUBJECTIVE STATEMENT:  Balance Impairment & Parkinson's  PERTINENT HISTORY:  Patient reports he's had a dozen falls within the last year; four occurred since his last visit to ED 07/15/2022. Reports spinning with bending over to perform laundry. He fell in his churches bathroom and hit his head. Patient had a laceration on his left ear which was repaired and his was discharged after further evaluation. Pt diagnosed with Parkinson's Disease. He goes to [employer]. He would like to be able to move without fear of falling & get out of bed without any help.    Pain: No Numbness/Tingling: Yes Numbness present BLE, below knee down to plantar aspect of the foot.  Focal Weakness: No Recent changes in overall health/medication: No Prior history of physical therapy for balance:  No Dominant hand: right  Imaging: Yes, Pt reports imaging negative  for hematoma, hemorrhaging Red flags: Negative for bowel/bladder changes, saddle paresthesia, personal history of cancer, h/o spinal tumors, h/o compression fx, h/o abdominal aneurysm, abdominal pain, chills/fever, night sweats, nausea, vomiting, unrelenting pain, first onset of insidious LBP <20 y/o  PRECAUTIONS: Fall  WEIGHT BEARING RESTRICTIONS: No FALLS: Has patient fallen in last 6 months? Yes. Number of falls 4 , Directional pattern for falls: Yes Backwards, turns and falls.  Living Environment Lives with: lives with their spouse Lives in: House/apartment Stairs: No Has following equipment at home: Single point cane and Environmental consultant - 2 wheeled  Prior level of function: Independent and Independent with household mobility with device  Occupational demands: Retired  Hobbies: TEFL teacher, [employer] 1x/wk  Patient Goals: He would like to be able to move without fear of falling & get out of bed without any help.   OBJECTIVE:   Patient Surveys  FOTO: 38, Predicted to improve 64 (Visit 19) ABC: Deferred  Cognition Patient is oriented to person, place, and time.  Recent memory is intact.  Remote memory is intact.  Attention span and concentration are intact.  Expressive speech is intact, pt speech is slightly muffled/hyphophonic.  Patient's fund of knowledge is within normal limits for educational level.   Gross Musculoskeletal Assessment Tremor: None Bulk: Normal Tone: Normal "Large mass" in posterior upper thoracic back; pt noted no pain to palpation. MD aware  Posture: Mildly kyphotic in thoracic spine  AROM AROM (Normal range in degrees) AROM   Lumbar   Flexion (65)   Extension (30)   Right lateral flexion (25)   Left lateral flexion (25)   Right rotation (30)   Left rotation (30)       Hip Right Left  Flexion (125)    Extension (15)    Abduction (40)    Adduction     Internal Rotation (45)    External Rotation (45)        Knee    Flexion (135)     Extension (0)        Ankle    Dorsiflexion (20)    Plantarflexion (50)    Inversion (35)    Eversion (15)    (* = pain; Blank rows = not tested)  LE MMT: MMT (out of 5) Right  Left   Hip flexion 4+ 4+  Hip extension    Hip abduction    Hip adduction    Hip internal rotation 4 4  Hip external rotation 4 4  Knee flexion 4- 4-  Knee extension 4- 4-  Ankle dorsiflexion 4- 4-  Ankle plantarflexion    Ankle inversion  Ankle eversion    (* = pain; Blank rows = not tested)  Sensation Grossly intact to light touch throughout bilateral LEs as determined by testing dermatomes L2-S2. Proprioception, stereognosis, and hot/cold testing deferred on this date.  Sensation is slightly more Right > Left L4-5, S1   Reflexes Deferred  Cranial Nerves Deferred  Coordination/Cerebellar Finger to Nose: Slight Deviation from finger, more impaired noted on left more than right  Heel to Shin: WNL Rapid alternating movements: WNL Finger Opposition: WNL Pronator Drift: Negative  Bed mobility: Supine to sit CGA, pt reported assistance from spouse to return to seated position  Transfers: Assistive device utilized: Environmental consultant - 2 wheeled  Sit to stand: Min A, MinA for power up phase only  Stand to sit: CGA Chair to chair: Deferred Floor: Deferred  Curb:  Level of Assistance: Deferred Assistive device utilized: Deferred Curb Comments: Deferred  Stairs: Deferred  Gait: Gait pattern: step to pattern, decreased arm swing- Right, decreased arm swing- Left, decreased step length- Right, decreased step length- Left, and decreased stride length Distance walked: 17m Assistive device utilized: Walker - 2 wheeled Level of assistance: Modified independence Comments: Patient presents with decreased step length bilaterally;  Functional Outcome Measures  Results Comments  BERG    DGI    FGA    TUG 21.9 seconds Required verbal cues, assistance for transfer    5TSTS    6 Minute Walk Test     10 Meter Gait Speed Self-selected: 11.3s = 0.88 m/s; With front wheeled walker, slightly below full community ambulation speed  (Blank rows = not tested)  VESTIBULAR TESTING Modified Dix-Hallpike R: negative, L: Negative    TODAY'S TREATMENT    SUBJECTIVE: Patient reported feeling well today. Patient reports adherence with HEP. No further questions or concerns.    PAIN: Denies pain    There-ex NuStep Level 1-4 x 8 Min for warmup and interval history (Settings: Seat 15, Arm 13); Sit to Stands with 4# Medicine Ball 1 x 10;  LAQ 5# Ankle Weight 2 x 10;  Large Stepping in agility ladder with Ankle Weights 5# x multiple lengths  Forward Lunge with Arms Opening Overhead 2 x 10;    Neuromuscular Re-ed  Airex Parallel Stance with Dual Tasking 2 x 30s  Airex Rhomberg Stance 2 x 30s Semi-Tandem Stance 2 x 30s Static Parallel Stance with Horizontal Head Turns (HT); Walking with Horizontal HT x multiple lengths; Forward Walking over blue mat in // Bars with Arm Swings x multiple lengths  Forward and Backwards Walking in Grass x multiple lengths  Forward Walking with PT facilitating arm swings around clinic     Not Performed: Airex Static Balance Progression 3 x 10s;   Airex Rhomberg 3 x 10s; Resisted Forwards and Backwards Walking in // Bars, unsteady with backwards walking;  Seated Long Arc Quads 2 x 10 5# Ankle Weight (AW); Standing Marches with 3# AW 2 x 10; Step Ups with 3# AW 2 x 10; Step Ups with Claps BLE 2 x 10s; Stepping on Airex 2 x 10;  Stepping Over 6" Hurdles in // bars with 3# AW, 4 x multiple lengths; Semi-Tandem Stance Level Ground 3 x 10s ; Semi-Tandem with Reaching task BLE in front 2 x multiple lengths; Sitting with Large Amplitude stomp and clap 2 x 10 BLE Static Stance, Level Surface with Head Turns (HT) 2 x 15s; Walking in // bars with HT 2 x multiple lengths;  Walking with Weaving through 6 cones x 4;  Weaving  Through 6 Cones with 6" hurdles x 4;   Weaving through 6 Cones with 12" hurdles x 4;   PATIENT EDUCATION:  Education details: Plan of Care  Person educated: Patient and Spouse Education method: Explanation Education comprehension: verbalized understanding   HOME EXERCISE PROGRAM:  Access Code: [email or web address] URL: [email or web address] Date: 08/31/2022 Prepared by: Ria Comment  Exercises - Sit to Stand with Counter Support  - 1 x daily - 7 x weekly - 3 sets - 10 reps - Standing March with Counter Support  - 1 x daily - 7 x weekly - 3 sets - 10 reps - Standing Romberg to 1/2 Tandem Stance  - 1 x daily - 7 x weekly - 3 x 30s with each foot forward hold   ASSESSMENT:  CLINICAL IMPRESSION: Patient arrived to physical therapy highly motivated. Patient continues to demonstrate minor LOB requiring min guard with walking over compliant surfaces or static stances with small BoS. Patient also challenged with static left forward and backward stepping. Required tactile and verbal cues throughout session to maintain balance. No updates to HEP this session; pt endorsed improvements in sit to stands at home. PT plan to continue progressing static and dynamic balance activities. Pt will continue to benefit from skilled physical therapy to address deficits in strength, balance, and mobility in order to return to full function at home.  OBJECTIVE IMPAIRMENTS: decreased activity tolerance, decreased balance, decreased coordination, decreased endurance, decreased mobility, difficulty walking, decreased ROM, decreased strength, and impaired sensation.   ACTIVITY LIMITATIONS: standing, squatting, stairs, transfers, and bed mobility  PARTICIPATION LIMITATIONS: laundry, driving, and community activity  PERSONAL FACTORS: Age, Fitness, Past/current experiences, and 3+ comorbidities: Atrial Fibrillation, CAD, Parkinson's,   are also affecting patient's functional outcome.   REHAB POTENTIAL: Good  CLINICAL DECISION MAKING:  Evolving/moderate complexity  EVALUATION COMPLEXITY: High   GOALS: Goals reviewed with patient? No  SHORT TERM GOALS: Target date:   Pt will be independent with HEP in order to improve strength and balance in order to decrease fall risk and improve function at home. Baseline:  Goal status: INITIAL   LONG TERM GOALS: Target date: 12/04/2022   Pt will increase FOTO to at least 64 to demonstrate significant improvement in function at home related to balance  Baseline: 08/08: 54 Goal status: INITIAL  2.  Pt will improve BERG by at least 3 points in order to demonstrate clinically significant improvement in balance.   Baseline: 08/08: 27; Goal status: INITIAL  3. Pt will decrease TUG to below 14 seconds/decrease in order to demonstrate decreased fall risk.  Baseline: 08/06: 21.9; Goal status: INITIAL  4. Pt will decrease 5TSTS by at least 3 seconds in order to demonstrate clinically significant improvement in LE strength Baseline: 08/08: 26s; Goal status: INITIAL  5. Pt will improve ABC by at least 13% in order to demonstrate clinically significant improvement in balance confidence. Baseline: 08/08: 63% Goal status: INITIAL  PLAN: PT FREQUENCY: 2x/week  PT DURATION: 8 weeks  PLANNED INTERVENTIONS: Therapeutic exercises, Therapeutic activity, Neuromuscular re-education, Balance training, Gait training, Patient/Family education, Self Care, Joint mobilization, Joint manipulation, Vestibular training, Canalith repositioning, Orthotic/Fit training, DME instructions, Dry Needling, Electrical stimulation, Spinal manipulation, Spinal mobilization, Cryotherapy, Moist heat, Taping, Traction, Ultrasound, Ionotophoresis 4mg /ml Dexamethasone, Manual therapy, and Re-evaluation.  PLAN FOR NEXT SESSION:  Continue progressing strength and balance program. Review and modify HEP   Jason D Huprich PT, DPT, GCS  Sandra Brents, SPT

## 2022-09-13 ENCOUNTER — Ambulatory Visit: Payer: Medicare HMO

## 2022-09-13 DIAGNOSIS — R2681 Unsteadiness on feet: Secondary | ICD-10-CM

## 2022-09-13 DIAGNOSIS — R2689 Other abnormalities of gait and mobility: Secondary | ICD-10-CM | POA: Diagnosis not present

## 2022-09-13 DIAGNOSIS — M6281 Muscle weakness (generalized): Secondary | ICD-10-CM | POA: Diagnosis not present

## 2022-09-13 NOTE — Therapy (Addendum)
OUTPATIENT PHYSICAL THERAPY BALANCE TREATMENT  Patient Name: Daniel [name] MRN: [id number] DOB:November 06, 1940, 82 y.o., male Today's Date: 09/13/2022  END OF SESSION:  PT End of Session - 09/13/22 1103     Visit Number 6    Number of Visits 25    Date for PT Re-Evaluation 11/20/22    Authorization Type eval: 08/28/22    PT Start Time 1100    PT Stop Time 1145    PT Time Calculation (min) 45 min    Equipment Utilized During Treatment Gait belt    Activity Tolerance Patient tolerated treatment well    Behavior During Therapy WFL for tasks assessed/performed              Past Medical History:  Diagnosis Date   AF (atrial fibrillation) (HCC)    B12 deficiency    Coronary artery disease    DH (dermatitis herpetiformis)    Dysphagia    Hypertension    Presence of permanent cardiac pacemaker    2006   Primary parkinsonism    Prostate cancer (HCC) 2014   Past Surgical History:  Procedure Laterality Date   APPENDECTOMY     CARDIAC CATHETERIZATION     CATARACT EXTRACTION W/PHACO Left 05/04/2020   Procedure: CATARACT EXTRACTION PHACO AND INTRAOCULAR LENS PLACEMENT (IOC) LEFT  6.76 00:59.9 11.3%;  Surgeon: Lockie Mola, MD;  Location: [facility];  Service: Ophthalmology;  Laterality: Left;   CATARACT EXTRACTION W/PHACO Right 05/18/2020   Procedure: CATARACT EXTRACTION PHACO AND INTRAOCULAR LENS PLACEMENT (IOC) RIGHT 15.56 01:52.6 13.8%;  Surgeon: Lockie Mola, MD;  Location: [facility];  Service: Ophthalmology;  Laterality: Right;   COLONOSCOPY  2008, 2014   CYSTOSCOPY WITH LITHOLAPAXY N/A 09/17/2019   Procedure: CYSTOSCOPY WITH LITHOLAPAXY;  Surgeon: Orson Ape, MD;  Location: [facility];  Service: Urology;  Laterality: N/A;   ESOPHAGOGASTRODUODENOSCOPY (EGD) WITH PROPOFOL N/A 10/13/2015   Procedure: ESOPHAGOGASTRODUODENOSCOPY (EGD) WITH PROPOFOL;  Surgeon: Christena Deem, MD;  Location: [facility];  Service: Endoscopy;  Laterality: N/A;    FASCIOTOMY Left 01/30/2019   Procedure: I&D BELOW FASCIA FOOT SINGLE LEFT;  Surgeon: Gwyneth Revels, DPM;  Location: [facility];  Service: Podiatry;  Laterality: Left;   GREEN LIGHT LASER TURP (TRANSURETHRAL RESECTION OF PROSTATE N/A 09/17/2019   Procedure: GREEN LIGHT LASER TURP (TRANSURETHRAL RESECTION OF PROSTATE;  Surgeon: Orson Ape, MD;  Location: [facility];  Service: Urology;  Laterality: N/A;   PACEMAKER PLACEMENT  2006   There are no problems to display for this patient.   PCP: Jerl Mina, MD  REFERRING PROVIDER: Lonell Face, MD   REFERRING DIAG: Primary Parkinsonism   RATIONALE FOR EVALUATION AND TREATMENT: Rehabilitation  THERAPY DIAG: Muscle weakness (generalized)  Other abnormalities of gait and mobility  Unsteadiness on feet  ONSET DATE: 07/15/2022  FOLLOW-UP APPT SCHEDULED WITH REFERRING PROVIDER:  Didn't Address    SUBJECTIVE:  SUBJECTIVE STATEMENT:  Balance Impairment & Parkinson's  PERTINENT HISTORY:  Patient reports he's had a dozen falls within the last year; four occurred since his last visit to ED 07/15/2022. Reports spinning with bending over to perform laundry. He fell in his churches bathroom and hit his head. Patient had a laceration on his left ear which was repaired and his was discharged after further evaluation. Pt diagnosed with Parkinson's Disease. He goes to [employer]. He would like to be able to move without fear of falling & get out of bed without any help.    Pain: No Numbness/Tingling: Yes Numbness present BLE, below knee down to plantar aspect of the foot.  Focal Weakness: No Recent changes in overall health/medication: No Prior history of physical therapy for balance:  No Dominant hand: right  Imaging: Yes, Pt reports imaging negative  for hematoma, hemorrhaging Red flags: Negative for bowel/bladder changes, saddle paresthesia, personal history of cancer, h/o spinal tumors, h/o compression fx, h/o abdominal aneurysm, abdominal pain, chills/fever, night sweats, nausea, vomiting, unrelenting pain, first onset of insidious LBP <20 y/o  PRECAUTIONS: Fall  WEIGHT BEARING RESTRICTIONS: No FALLS: Has patient fallen in last 6 months? Yes. Number of falls 4 , Directional pattern for falls: Yes Backwards, turns and falls.  Living Environment Lives with: lives with their spouse Lives in: House/apartment Stairs: No Has following equipment at home: Single point cane and Environmental consultant - 2 wheeled  Prior level of function: Independent and Independent with household mobility with device  Occupational demands: Retired  Hobbies: TEFL teacher, [employer] 1x/wk  Patient Goals: He would like to be able to move without fear of falling & get out of bed without any help.   OBJECTIVE:   Patient Surveys  FOTO: 30, Predicted to improve 64 (Visit 19) ABC: Deferred  Cognition Patient is oriented to person, place, and time.  Recent memory is intact.  Remote memory is intact.  Attention span and concentration are intact.  Expressive speech is intact, pt speech is slightly muffled/hyphophonic.  Patient's fund of knowledge is within normal limits for educational level.   Gross Musculoskeletal Assessment Tremor: None Bulk: Normal Tone: Normal "Large mass" in posterior upper thoracic back; pt noted no pain to palpation. MD aware  Posture: Mildly kyphotic in thoracic spine  AROM AROM (Normal range in degrees) AROM   Lumbar   Flexion (65)   Extension (30)   Right lateral flexion (25)   Left lateral flexion (25)   Right rotation (30)   Left rotation (30)       Hip Right Left  Flexion (125)    Extension (15)    Abduction (40)    Adduction     Internal Rotation (45)    External Rotation (45)        Knee    Flexion (135)     Extension (0)        Ankle    Dorsiflexion (20)    Plantarflexion (50)    Inversion (35)    Eversion (15)    (* = pain; Blank rows = not tested)  LE MMT: MMT (out of 5) Right  Left   Hip flexion 4+ 4+  Hip extension    Hip abduction    Hip adduction    Hip internal rotation 4 4  Hip external rotation 4 4  Knee flexion 4- 4-  Knee extension 4- 4-  Ankle dorsiflexion 4- 4-  Ankle plantarflexion    Ankle inversion  Ankle eversion    (* = pain; Blank rows = not tested)  Sensation Grossly intact to light touch throughout bilateral LEs as determined by testing dermatomes L2-S2. Proprioception, stereognosis, and hot/cold testing deferred on this date.  Sensation is slightly more Right > Left L4-5, S1   Reflexes Deferred  Cranial Nerves Deferred  Coordination/Cerebellar Finger to Nose: Slight Deviation from finger, more impaired noted on left more than right  Heel to Shin: WNL Rapid alternating movements: WNL Finger Opposition: WNL Pronator Drift: Negative  Bed mobility: Supine to sit CGA, pt reported assistance from spouse to return to seated position  Transfers: Assistive device utilized: Environmental consultant - 2 wheeled  Sit to stand: Min A, MinA for power up phase only  Stand to sit: CGA Chair to chair: Deferred Floor: Deferred  Curb:  Level of Assistance: Deferred Assistive device utilized: Deferred Curb Comments: Deferred  Stairs: Deferred  Gait: Gait pattern: step to pattern, decreased arm swing- Right, decreased arm swing- Left, decreased step length- Right, decreased step length- Left, and decreased stride length Distance walked: 77m Assistive device utilized: Walker - 2 wheeled Level of assistance: Modified independence Comments: Patient presents with decreased step length bilaterally;  Functional Outcome Measures  Results Comments  BERG    DGI    FGA    TUG 21.9 seconds Required verbal cues, assistance for transfer    5TSTS    6 Minute Walk Test     10 Meter Gait Speed Self-selected: 11.3s = 0.88 m/s; With front wheeled walker, slightly below full community ambulation speed  (Blank rows = not tested)  VESTIBULAR TESTING Modified Dix-Hallpike R: negative, L: Negative    TODAY'S TREATMENT    SUBJECTIVE: Patient reports feeling well today. 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 Patient reports adherence with HEP. No further questions or concerns.    PAIN: Denies pain    There-ex NuStep Level 1-4 x 10; 30 sec on: 1 min off Min for power and high intensity and interval history (Settings: Seat 15, Arm 13); Boxing with Hook Punches for rotation 3 x 30s;  Boxing Punches for speed 1 x 30s; Sit to Stands without UE support and Airex pad on seat 1 x 10;  Sit to stands with UE support 1 x 10; LAQ 5# Ankle Weight 2 x 10;  Step up 6" and 12" with BUE support and 5# Ankle Weight x 20 taps  Squat with 10# Medicine Ball Chest Pass 2 x 6 Squat with 10# Medicine Ball Bounce Pass 2 x 6   Neuromuscular Re-ed  Obstacle Course in the grass (2 Wooden Hurdles, 2 Airex Pads, 3 6" hurdles) x multiple lengths  Airex Step Ups and Backward Step Downs 2 x 10;   Not Performed: Airex Static Balance Progression 3 x 10s;   Airex Rhomberg 3 x 10s; Resisted Forwards and Backwards Walking in // Bars, unsteady with backwards walking;  Seated Long Arc Quads 2 x 10 5# Ankle Weight (AW); Standing Marches with 3# AW 2 x 10; Step Ups with 3# AW 2 x 10; Step Ups with Claps BLE 2 x 10s; Stepping on Airex 2 x 10;  Stepping Over 6" Hurdles in // bars with 3# AW, 4 x multiple lengths; Semi-Tandem Stance Level Ground 3 x 10s ; Semi-Tandem with Reaching task BLE in front 2 x multiple lengths; Sitting with Large Amplitude stomp and clap 2 x 10 BLE Static Stance, Level Surface with Head Turns (HT) 2 x 15s; Walking in //  bars with HT 2 x multiple lengths;  Walking with Weaving through 6 cones x 4;  Weaving Through 6 Cones with 6"  hurdles x 4;  Weaving through 6 Cones with 12" hurdles x 4;   PATIENT EDUCATION:  Education details: Plan of Care  Person educated: Patient and Spouse Education method: Explanation Education comprehension: verbalized understanding   HOME EXERCISE PROGRAM:  Access Code: [email or web address] URL: [email or web address] Date: 08/31/2022 Prepared by: Ria Comment  Exercises - Sit to Stand with Counter Support  - 1 x daily - 7 x weekly - 3 sets - 10 reps - Standing March with Counter Support  - 1 x daily - 7 x weekly - 3 sets - 10 reps - Standing Romberg to 1/2 Tandem Stance  - 1 x daily - 7 x weekly - 3 x 30s with each foot forward hold   ASSESSMENT:  CLINICAL IMPRESSION: Patient arrived to physical therapy highly motivated. Today's session with a focus on LE strength and emphasis on power. Patient still presents with minor LOB and posterior postural sway when performing rotational exercises or movements outside of BoS. He demonstrated ability to walk without use of AD into the gym but had one instance of step with narrow BOS eliciting minor LOB. MinA+1 provided throughout session from PT in order for pt to maintain balance throughout exercises. No updates to HEP this session; pt endorsed improvements in static balance at home. PT plan to continue progressing static and dynamic balance activities. Pt will continue to benefit from skilled physical therapy to address deficits in strength, balance, and mobility in order to return to full function at home.  OBJECTIVE IMPAIRMENTS: decreased activity tolerance, decreased balance, decreased coordination, decreased endurance, decreased mobility, difficulty walking, decreased ROM, decreased strength, and impaired sensation.   ACTIVITY LIMITATIONS: standing, squatting, stairs, transfers, and bed mobility  PARTICIPATION LIMITATIONS: laundry, driving, and community activity  PERSONAL FACTORS: Age, Fitness, Past/current experiences, and 3+  comorbidities: Atrial Fibrillation, CAD, Parkinson's,   are also affecting patient's functional outcome.   REHAB POTENTIAL: Good  CLINICAL DECISION MAKING: Evolving/moderate complexity  EVALUATION COMPLEXITY: High   GOALS: Goals reviewed with patient? No  SHORT TERM GOALS: Target date:   Pt will be independent with HEP in order to improve strength and balance in order to decrease fall risk and improve function at home. Baseline:  Goal status: INITIAL   LONG TERM GOALS: Target date: 12/06/2022   Pt will increase FOTO to at least 64 to demonstrate significant improvement in function at home related to balance  Baseline: 08/08: 54 Goal status: INITIAL  2.  Pt will improve BERG by at least 3 points in order to demonstrate clinically significant improvement in balance.   Baseline: 08/08: 27; Goal status: INITIAL  3. Pt will decrease TUG to below 14 seconds/decrease in order to demonstrate decreased fall risk.  Baseline: 08/06: 21.9; Goal status: INITIAL  4. Pt will decrease 5TSTS by at least 3 seconds in order to demonstrate clinically significant improvement in LE strength Baseline: 08/08: 26s; Goal status: INITIAL  5. Pt will improve ABC by at least 13% in order to demonstrate clinically significant improvement in balance confidence. Baseline: 08/08: 63% Goal status: INITIAL  PLAN: PT FREQUENCY: 2x/week  PT DURATION: 8 weeks  PLANNED INTERVENTIONS: Therapeutic exercises, Therapeutic activity, Neuromuscular re-education, Balance training, Gait training, Patient/Family education, Self Care, Joint mobilization, Joint manipulation, Vestibular training, Canalith repositioning, Orthotic/Fit training, DME instructions, Dry Needling, Electrical stimulation, Spinal manipulation, Spinal mobilization, Cryotherapy, Moist heat, Taping, Traction, Ultrasound,  Ionotophoresis 4mg /ml Dexamethasone, Manual therapy, and Re-evaluation.  PLAN FOR NEXT SESSION:  Continue progressing strength  and balance program. Review and modify HEP   Jason D Huprich PT, DPT, GCS  Leea Rambeau, SPT

## 2022-09-18 ENCOUNTER — Ambulatory Visit: Payer: Medicare HMO

## 2022-09-18 DIAGNOSIS — R2689 Other abnormalities of gait and mobility: Secondary | ICD-10-CM

## 2022-09-18 DIAGNOSIS — R2681 Unsteadiness on feet: Secondary | ICD-10-CM | POA: Diagnosis not present

## 2022-09-18 DIAGNOSIS — M6281 Muscle weakness (generalized): Secondary | ICD-10-CM | POA: Diagnosis not present

## 2022-09-18 NOTE — Therapy (Cosign Needed Addendum)
OUTPATIENT PHYSICAL THERAPY BALANCE TREATMENT  Patient Name: POWER ARNET MRN: [id number] DOB:20-May-1940, 82 y.o., male Today's Date: 09/19/2022  END OF SESSION:  PT End of Session - 09/18/22 1148     Visit Number 7    Number of Visits 25    Date for PT Re-Evaluation 11/20/22    Authorization Type eval: 08/28/22    PT Start Time 1100    PT Stop Time 1148    PT Time Calculation (min) 48 min    Equipment Utilized During Treatment Gait belt    Activity Tolerance Patient tolerated treatment well    Behavior During Therapy WFL for tasks assessed/performed            Past Medical History:  Diagnosis Date   AF (atrial fibrillation) (HCC)    B12 deficiency    Coronary artery disease    DH (dermatitis herpetiformis)    Dysphagia    Hypertension    Presence of permanent cardiac pacemaker    2006   Primary parkinsonism    Prostate cancer (HCC) 2014   Past Surgical History:  Procedure Laterality Date   APPENDECTOMY     CARDIAC CATHETERIZATION     CATARACT EXTRACTION W/PHACO Left 05/04/2020   Procedure: CATARACT EXTRACTION PHACO AND INTRAOCULAR LENS PLACEMENT (IOC) LEFT  6.76 00:59.9 11.3%;  Surgeon: Lockie Mola, MD;  Location: [facility];  Service: Ophthalmology;  Laterality: Left;   CATARACT EXTRACTION W/PHACO Right 05/18/2020   Procedure: CATARACT EXTRACTION PHACO AND INTRAOCULAR LENS PLACEMENT (IOC) RIGHT 15.56 01:52.6 13.8%;  Surgeon: Lockie Mola, MD;  Location: [facility];  Service: Ophthalmology;  Laterality: Right;   COLONOSCOPY  2008, 2014   CYSTOSCOPY WITH LITHOLAPAXY N/A 09/17/2019   Procedure: CYSTOSCOPY WITH LITHOLAPAXY;  Surgeon: Orson Ape, MD;  Location: [facility];  Service: Urology;  Laterality: N/A;   ESOPHAGOGASTRODUODENOSCOPY (EGD) WITH PROPOFOL N/A 10/13/2015   Procedure: ESOPHAGOGASTRODUODENOSCOPY (EGD) WITH PROPOFOL;  Surgeon: Christena Deem, MD;  Location: [facility];  Service: Endoscopy;  Laterality: N/A;    FASCIOTOMY Left 01/30/2019   Procedure: I&D BELOW FASCIA FOOT SINGLE LEFT;  Surgeon: Gwyneth Revels, DPM;  Location: [facility];  Service: Podiatry;  Laterality: Left;   GREEN LIGHT LASER TURP (TRANSURETHRAL RESECTION OF PROSTATE N/A 09/17/2019   Procedure: GREEN LIGHT LASER TURP (TRANSURETHRAL RESECTION OF PROSTATE;  Surgeon: Orson Ape, MD;  Location: [facility];  Service: Urology;  Laterality: N/A;   PACEMAKER PLACEMENT  2006   There are no problems to display for this patient.   PCP: Jerl Mina, MD  REFERRING PROVIDER: Lonell Face, MD   REFERRING DIAG: Primary Parkinsonism   RATIONALE FOR EVALUATION AND TREATMENT: Rehabilitation  THERAPY DIAG: Muscle weakness (generalized)  Other abnormalities of gait and mobility  Unsteadiness on feet  ONSET DATE: 07/15/2022  FOLLOW-UP APPT SCHEDULED WITH REFERRING PROVIDER:  Didn't Address    SUBJECTIVE:  SUBJECTIVE STATEMENT:  Balance Impairment & Parkinson's  PERTINENT HISTORY:  Patient reports he's had a dozen falls within the last year; four occurred since his last visit to ED 07/15/2022. Reports spinning with bending over to perform laundry. He fell in his churches bathroom and hit his head. Patient had a laceration on his left ear which was repaired and his was discharged after further evaluation. Pt diagnosed with Parkinson's Disease. He goes to [employer]. He would like to be able to move without fear of falling & get out of bed without any help.    Pain: No Numbness/Tingling: Yes Numbness present BLE, below knee down to plantar aspect of the foot.  Focal Weakness: No Recent changes in overall health/medication: No Prior history of physical therapy for balance:  No Dominant hand: right  Imaging: Yes, Pt reports imaging negative  for hematoma, hemorrhaging Red flags: Negative for bowel/bladder changes, saddle paresthesia, personal history of cancer, h/o spinal tumors, h/o compression fx, h/o abdominal aneurysm, abdominal pain, chills/fever, night sweats, nausea, vomiting, unrelenting pain, first onset of insidious LBP <20 y/o  PRECAUTIONS: Fall  WEIGHT BEARING RESTRICTIONS: No FALLS: Has patient fallen in last 6 months? Yes. Number of falls 4 , Directional pattern for falls: Yes Backwards, turns and falls.  Living Environment Lives with: lives with their spouse Lives in: House/apartment Stairs: No Has following equipment at home: Single point cane and Environmental consultant - 2 wheeled  Prior level of function: Independent and Independent with household mobility with device  Occupational demands: Retired  Hobbies: TEFL teacher, [employer] 1x/wk  Patient Goals: He would like to be able to move without fear of falling & get out of bed without any help.   OBJECTIVE:   Patient Surveys  FOTO: 10, Predicted to improve 64 (Visit 19) ABC: Deferred  Cognition Patient is oriented to person, place, and time.  Recent memory is intact.  Remote memory is intact.  Attention span and concentration are intact.  Expressive speech is intact, pt speech is slightly muffled/hyphophonic.  Patient's fund of knowledge is within normal limits for educational level.   Gross Musculoskeletal Assessment Tremor: None Bulk: Normal Tone: Normal "Large mass" in posterior upper thoracic back; pt noted no pain to palpation. MD aware  Posture: Mildly kyphotic in thoracic spine  AROM AROM (Normal range in degrees) AROM   Lumbar   Flexion (65)   Extension (30)   Right lateral flexion (25)   Left lateral flexion (25)   Right rotation (30)   Left rotation (30)       Hip Right Left  Flexion (125)    Extension (15)    Abduction (40)    Adduction     Internal Rotation (45)    External Rotation (45)        Knee    Flexion (135)     Extension (0)        Ankle    Dorsiflexion (20)    Plantarflexion (50)    Inversion (35)    Eversion (15)    (* = pain; Blank rows = not tested)  LE MMT: MMT (out of 5) Right  Left   Hip flexion 4+ 4+  Hip extension    Hip abduction    Hip adduction    Hip internal rotation 4 4  Hip external rotation 4 4  Knee flexion 4- 4-  Knee extension 4- 4-  Ankle dorsiflexion 4- 4-  Ankle plantarflexion    Ankle inversion  Ankle eversion    (* = pain; Blank rows = not tested)  Sensation Grossly intact to light touch throughout bilateral LEs as determined by testing dermatomes L2-S2. Proprioception, stereognosis, and hot/cold testing deferred on this date.  Sensation is slightly more Right > Left L4-5, S1   Reflexes Deferred  Cranial Nerves Deferred  Coordination/Cerebellar Finger to Nose: Slight Deviation from finger, more impaired noted on left more than right  Heel to Shin: WNL Rapid alternating movements: WNL Finger Opposition: WNL Pronator Drift: Negative  Bed mobility: Supine to sit CGA, pt reported assistance from spouse to return to seated position  Transfers: Assistive device utilized: Environmental consultant - 2 wheeled  Sit to stand: Min A, MinA for power up phase only  Stand to sit: CGA Chair to chair: Deferred Floor: Deferred  Curb:  Level of Assistance: Deferred Assistive device utilized: Deferred Curb Comments: Deferred  Stairs: Deferred  Gait: Gait pattern: step to pattern, decreased arm swing- Right, decreased arm swing- Left, decreased step length- Right, decreased step length- Left, and decreased stride length Distance walked: 106m Assistive device utilized: Walker - 2 wheeled Level of assistance: Modified independence Comments: Patient presents with decreased step length bilaterally;  Functional Outcome Measures  Results Comments  BERG    DGI    FGA    TUG 21.9 seconds Required verbal cues, assistance for transfer    5TSTS    6 Minute Walk Test     10 Meter Gait Speed Self-selected: 11.3s = 0.88 m/s; With front wheeled walker, slightly below full community ambulation speed  (Blank rows = not tested)  VESTIBULAR TESTING Modified Dix-Hallpike R: negative, L: Negative    TODAY'S TREATMENT    SUBJECTIVE: Patient reports feeling well today. He continues experiencing difficulty with Romberg stance. Patient reports adherence with HEP. No further questions or concerns.    PAIN: Denies pain    There-ex NuStep Level 1-4 x ; 30 sec on: 1 min off Min for power and high intensity and interval history (Settings: Seat 15, Arm 13); Standing Marches with 5# Ankle Weight 2 x 20 Reps 6" Steps with 5# Ankle Weights 2 x 10 BLE;  Sit to Stand focused on Power 2 x 10 (Minguard assistance for power up);  Neuromuscular Re-ed  Marching over 6" hurdles  in agility ladder 4 x 4 steps;  Rhomberg, Semi Tandem Stance 2 x 30s each direction ; Semi Tandem with alternative LE in front and reaching task 3 x 30s;  Side stepping with punches x multiple lengths;  Cross Stepping Left and Right  x multiple legnths; Cross Stepping Left and Right with Boxing Punches x multiple lengths   Not Performed: Airex Static Balance Progression 3 x 10s;   Airex Rhomberg 3 x 10s; Resisted Forwards and Backwards Walking in // Bars, unsteady with backwards walking;  Seated Long Arc Quads 2 x 10 5# Ankle Weight (AW); Standing Marches with 3# AW 2 x 10; Step Ups with 3# AW 2 x 10; Step Ups with Claps BLE 2 x 10s; Stepping on Airex 2 x 10;  Stepping Over 6" Hurdles in // bars with 3# AW, 4 x multiple lengths; Semi-Tandem Stance Level Ground 3 x 10s ; Semi-Tandem with Reaching task BLE in front 2 x multiple lengths; Sitting with Large Amplitude stomp and clap 2 x 10 BLE Static Stance, Level Surface with Head Turns (HT) 2 x 15s; Walking in // bars with HT 2 x multiple lengths;  Walking with Weaving through 6 cones x 4;  Weaving Through 6 Cones with 6" hurdles x 4;   Weaving through 6 Cones with 12" hurdles x 4;   PATIENT EDUCATION:  Education details: Plan of Care  Person educated: Patient and Spouse Education method: Explanation Education comprehension: verbalized understanding   HOME EXERCISE PROGRAM:  Access Code: [email or web address] URL: [email or web address] Date: 08/31/2022 Prepared by: Ria Comment  Exercises - Sit to Stand with Counter Support  - 1 x daily - 7 x weekly - 3 sets - 10 reps - Standing March with Counter Support  - 1 x daily - 7 x weekly - 3 sets - 10 reps - Standing Romberg to 1/2 Tandem Stance  - 1 x daily - 7 x weekly - 3 x 30s with each foot forward hold   ASSESSMENT:  CLINICAL IMPRESSION: Patient arrived to physical therapy highly motivated. Today's session with a main focus on LE strengthening and balance. Patient demonstrates ability to perform sit to stand without assistance from PT but still requires intermittent cues for forward lean and increased power with standing. Patient challenged with crossover stepping pattern, required tactile and verbal cues to initiate movement. He continues to demonstrate improvements with static balance with parallel and staggered stances. Patient does have more difficulty maintaining static balance with right LE in front versus left LE in front.  No updates to HEP this session; pt endorsed improvements in static balance at home. PT plan to continue progressing static and dynamic balance activities. Pt will continue to benefit from skilled physical therapy to address deficits in strength, balance, and mobility in order to return to full function at home.  OBJECTIVE IMPAIRMENTS: decreased activity tolerance, decreased balance, decreased coordination, decreased endurance, decreased mobility, difficulty walking, decreased ROM, decreased strength, and impaired sensation.   ACTIVITY LIMITATIONS: standing, squatting, stairs, transfers, and bed mobility  PARTICIPATION LIMITATIONS:  laundry, driving, and community activity  PERSONAL FACTORS: Age, Fitness, Past/current experiences, and 3+ comorbidities: Atrial Fibrillation, CAD, Parkinson's,   are also affecting patient's functional outcome.   REHAB POTENTIAL: Good  CLINICAL DECISION MAKING: Evolving/moderate complexity  EVALUATION COMPLEXITY: High   GOALS: Goals reviewed with patient? No  SHORT TERM GOALS: Target date:   Pt will be independent with HEP in order to improve strength and balance in order to decrease fall risk and improve function at home. Baseline:  Goal status: INITIAL   LONG TERM GOALS: Target date: 12/12/2022   Pt will increase FOTO to at least 64 to demonstrate significant improvement in function at home related to balance  Baseline: 08/08: 54 Goal status: INITIAL  2.  Pt will improve BERG by at least 3 points in order to demonstrate clinically significant improvement in balance.   Baseline: 08/08: 27; Goal status: INITIAL  3. Pt will decrease TUG to below 14 seconds/decrease in order to demonstrate decreased fall risk.  Baseline: 08/06: 21.9; Goal status: INITIAL  4. Pt will decrease 5TSTS by at least 3 seconds in order to demonstrate clinically significant improvement in LE strength Baseline: 08/08: 26s; Goal status: INITIAL  5. Pt will improve ABC by at least 13% in order to demonstrate clinically significant improvement in balance confidence. Baseline: 08/08: 63% Goal status: INITIAL  PLAN: PT FREQUENCY: 2x/week  PT DURATION: 8 weeks  PLANNED INTERVENTIONS: Therapeutic exercises, Therapeutic activity, Neuromuscular re-education, Balance training, Gait training, Patient/Family education, Self Care, Joint mobilization, Joint manipulation, Vestibular training, Canalith repositioning, Orthotic/Fit training, DME instructions, Dry Needling, Electrical stimulation, Spinal manipulation, Spinal mobilization, Cryotherapy, Moist heat, Taping, Traction, Ultrasound, Ionotophoresis 4mg /ml  Dexamethasone, Manual  therapy, and Re-evaluation.  PLAN FOR NEXT SESSION:  Continue progressing strength and balance program. Review and modify HEP   Jason D Huprich PT, DPT, GCS  Eathan Groman, SPT

## 2022-09-20 ENCOUNTER — Ambulatory Visit: Payer: Medicare HMO

## 2022-09-20 DIAGNOSIS — M6281 Muscle weakness (generalized): Secondary | ICD-10-CM

## 2022-09-20 DIAGNOSIS — R2689 Other abnormalities of gait and mobility: Secondary | ICD-10-CM | POA: Diagnosis not present

## 2022-09-20 DIAGNOSIS — R2681 Unsteadiness on feet: Secondary | ICD-10-CM

## 2022-09-20 NOTE — Therapy (Addendum)
OUTPATIENT PHYSICAL THERAPY BALANCE TREATMENT  Patient Name: Daniel [name] MRN: [id number] DOB:Jul 24, 1940, 82 y.o., male Today's Date: 09/21/2022  END OF SESSION:  PT End of Session - 09/20/22 1148     Visit Number 8    Number of Visits 25    Date for PT Re-Evaluation 11/20/22    Authorization Type eval: 08/28/22    PT Start Time 1100    PT Stop Time 1145    PT Time Calculation (min) 45 min    Equipment Utilized During Treatment Gait belt    Activity Tolerance Patient tolerated treatment well    Behavior During Therapy WFL for tasks assessed/performed            Past Medical History:  Diagnosis Date   AF (atrial fibrillation) (HCC)    B12 deficiency    Coronary artery disease    DH (dermatitis herpetiformis)    Dysphagia    Hypertension    Presence of permanent cardiac pacemaker    2006   Primary parkinsonism    Prostate cancer (HCC) 2014   Past Surgical History:  Procedure Laterality Date   APPENDECTOMY     CARDIAC CATHETERIZATION     CATARACT EXTRACTION W/PHACO Left 05/04/2020   Procedure: CATARACT EXTRACTION PHACO AND INTRAOCULAR LENS PLACEMENT (IOC) LEFT  6.76 00:59.9 11.3%;  Surgeon: Lockie Mola, MD;  Location: [facility];  Service: Ophthalmology;  Laterality: Left;   CATARACT EXTRACTION W/PHACO Right 05/18/2020   Procedure: CATARACT EXTRACTION PHACO AND INTRAOCULAR LENS PLACEMENT (IOC) RIGHT 15.56 01:52.6 13.8%;  Surgeon: Lockie Mola, MD;  Location: [facility];  Service: Ophthalmology;  Laterality: Right;   COLONOSCOPY  2008, 2014   CYSTOSCOPY WITH LITHOLAPAXY N/A 09/17/2019   Procedure: CYSTOSCOPY WITH LITHOLAPAXY;  Surgeon: Orson Ape, MD;  Location: [facility];  Service: Urology;  Laterality: N/A;   ESOPHAGOGASTRODUODENOSCOPY (EGD) WITH PROPOFOL N/A 10/13/2015   Procedure: ESOPHAGOGASTRODUODENOSCOPY (EGD) WITH PROPOFOL;  Surgeon: Christena Deem, MD;  Location: [facility];  Service: Endoscopy;  Laterality: N/A;    FASCIOTOMY Left 01/30/2019   Procedure: I&D BELOW FASCIA FOOT SINGLE LEFT;  Surgeon: Gwyneth Revels, DPM;  Location: [facility];  Service: Podiatry;  Laterality: Left;   GREEN LIGHT LASER TURP (TRANSURETHRAL RESECTION OF PROSTATE N/A 09/17/2019   Procedure: GREEN LIGHT LASER TURP (TRANSURETHRAL RESECTION OF PROSTATE;  Surgeon: Orson Ape, MD;  Location: [facility];  Service: Urology;  Laterality: N/A;   PACEMAKER PLACEMENT  2006   There are no problems to display for this patient.   PCP: Jerl Mina, MD  REFERRING PROVIDER: Lonell Face, MD   REFERRING DIAG: Primary Parkinsonism   RATIONALE FOR EVALUATION AND TREATMENT: Rehabilitation  THERAPY DIAG: Muscle weakness (generalized)  Other abnormalities of gait and mobility  Unsteadiness on feet  ONSET DATE: 07/15/2022  FOLLOW-UP APPT SCHEDULED WITH REFERRING PROVIDER:  Didn't Address    SUBJECTIVE:  SUBJECTIVE STATEMENT:  Balance Impairment & Parkinson's  PERTINENT HISTORY:  Patient reports he's had a dozen falls within the last year; four occurred since his last visit to ED 07/15/2022. Reports spinning with bending over to perform laundry. He fell in his churches bathroom and hit his head. Patient had a laceration on his left ear which was repaired and his was discharged after further evaluation. Pt diagnosed with Parkinson's Disease. He goes to [employer]. He would like to be able to move without fear of falling & get out of bed without any help.    Pain: No Numbness/Tingling: Yes Numbness present BLE, below knee down to plantar aspect of the foot.  Focal Weakness: No Recent changes in overall health/medication: No Prior history of physical therapy for balance:  No Dominant hand: right  Imaging: Yes, Pt reports imaging negative  for hematoma, hemorrhaging Red flags: Negative for bowel/bladder changes, saddle paresthesia, personal history of cancer, h/o spinal tumors, h/o compression fx, h/o abdominal aneurysm, abdominal pain, chills/fever, night sweats, nausea, vomiting, unrelenting pain, first onset of insidious LBP <20 y/o  PRECAUTIONS: Fall  WEIGHT BEARING RESTRICTIONS: No FALLS: Has patient fallen in last 6 months? Yes. Number of falls 4 , Directional pattern for falls: Yes Backwards, turns and falls.  Living Environment Lives with: lives with their spouse Lives in: House/apartment Stairs: No Has following equipment at home: Single point cane and Environmental consultant - 2 wheeled  Prior level of function: Independent and Independent with household mobility with device  Occupational demands: Retired  Hobbies: TEFL teacher, [employer] 1x/wk  Patient Goals: He would like to be able to move without fear of falling & get out of bed without any help.   OBJECTIVE:   Patient Surveys  FOTO: 74, Predicted to improve 64 (Visit 19) ABC: Deferred  Cognition Patient is oriented to person, place, and time.  Recent memory is intact.  Remote memory is intact.  Attention span and concentration are intact.  Expressive speech is intact, pt speech is slightly muffled/hyphophonic.  Patient's fund of knowledge is within normal limits for educational level.   Gross Musculoskeletal Assessment Tremor: None Bulk: Normal Tone: Normal "Large mass" in posterior upper thoracic back; pt noted no pain to palpation. MD aware  Posture: Mildly kyphotic in thoracic spine  AROM AROM (Normal range in degrees) AROM   Lumbar   Flexion (65)   Extension (30)   Right lateral flexion (25)   Left lateral flexion (25)   Right rotation (30)   Left rotation (30)       Hip Right Left  Flexion (125)    Extension (15)    Abduction (40)    Adduction     Internal Rotation (45)    External Rotation (45)        Knee    Flexion (135)     Extension (0)        Ankle    Dorsiflexion (20)    Plantarflexion (50)    Inversion (35)    Eversion (15)    (* = pain; Blank rows = not tested)  LE MMT: MMT (out of 5) Right  Left   Hip flexion 4+ 4+  Hip extension    Hip abduction    Hip adduction    Hip internal rotation 4 4  Hip external rotation 4 4  Knee flexion 4- 4-  Knee extension 4- 4-  Ankle dorsiflexion 4- 4-  Ankle plantarflexion    Ankle inversion  Ankle eversion    (* = pain; Blank rows = not tested)  Sensation Grossly intact to light touch throughout bilateral LEs as determined by testing dermatomes L2-S2. Proprioception, stereognosis, and hot/cold testing deferred on this date.  Sensation is slightly more Right > Left L4-5, S1   Reflexes Deferred  Cranial Nerves Deferred  Coordination/Cerebellar Finger to Nose: Slight Deviation from finger, more impaired noted on left more than right  Heel to Shin: WNL Rapid alternating movements: WNL Finger Opposition: WNL Pronator Drift: Negative  Bed mobility: Supine to sit CGA, pt reported assistance from spouse to return to seated position  Transfers: Assistive device utilized: Environmental consultant - 2 wheeled  Sit to stand: Min A, MinA for power up phase only  Stand to sit: CGA Chair to chair: Deferred Floor: Deferred  Curb:  Level of Assistance: Deferred Assistive device utilized: Deferred Curb Comments: Deferred  Stairs: Deferred  Gait: Gait pattern: step to pattern, decreased arm swing- Right, decreased arm swing- Left, decreased step length- Right, decreased step length- Left, and decreased stride length Distance walked: 46m Assistive device utilized: Walker - 2 wheeled Level of assistance: Modified independence Comments: Patient presents with decreased step length bilaterally;  Functional Outcome Measures  Results Comments  BERG    DGI    FGA    TUG 21.9 seconds Required verbal cues, assistance for transfer    5TSTS    6 Minute Walk Test     10 Meter Gait Speed Self-selected: 11.3s = 0.88 m/s; With front wheeled walker, slightly below full community ambulation speed  (Blank rows = not tested)  VESTIBULAR TESTING Modified Dix-Hallpike R: negative, L: Negative    TODAY'S TREATMENT    SUBJECTIVE: Patient reports feeling well today. He endorsed successfully performed semi-tandem stance two times out of five trials. Patient reports adherence with HEP. No further questions or concerns.    PAIN: Denies pain    Ther-ex NuStep Level 1-4 x ; 30 sec on: 1 min off Min for power and high intensity and interval history (Settings: Seat 15, Arm 13); Seated LAQ 5# 2 x 10 BLE; Seated Marches with 5# 2 x 10 BLE;  Step Ups with BUE support 2 x 10 BLE;  Sit to Stand focused on Power 1 x 10, 1 x 10 with opening BUE (Minguard assistance for power up); Nautilus Forward and Backward walking 30# and 20# in agility ladder x3 each direction; Walking with PT facilitated Arm Swing in hallway ~433ft;   Neuromuscular Re-ed  Rhomberg, Semi Tandem Stance, Eyes Open 2 x 30s each direction ; Rhomberg EC 2 x 30s;  Semi Tandem with alternative LE in front, with BUE reaching forward 3 x 30s;  Standing Staggered Stance with alternative LE in front BUE reaching forward and manual pertubation 2 x 30s;    Not Performed: Airex Static Balance Progression 3 x 10s;   Airex Rhomberg 3 x 10s; Cross Stepping Left and Right  x multiple legnths; Cross Stepping Left and Right with Boxing Punches x multiple lengths Resisted Forwards and Backwards Walking in // Bars, unsteady with backwards walking;  Seated Long Arc Quads 2 x 10 5# Ankle Weight (AW); Standing Marches with 3# AW 2 x 10; Step Ups with 3# AW 2 x 10; Step Ups with Claps BLE 2 x 10s; Stepping on Airex 2 x 10;  Stepping Over 6" Hurdles in // bars with 3# AW, 4 x multiple lengths; Semi-Tandem Stance Level Ground 3 x 10s ; Semi-Tandem with Reaching task BLE in front  2 x multiple lengths; Sitting  with Large Amplitude stomp and clap 2 x 10 BLE Static Stance, Level Surface with Head Turns (HT) 2 x 15s; Walking in // bars with HT 2 x multiple lengths;  Walking with Weaving through 6 cones x 4;  Weaving Through 6 Cones with 6" hurdles x 4;  Weaving through 6 Cones with 12" hurdles x 4;   PATIENT EDUCATION:  Education details: Plan of Care  Person educated: Patient and Spouse Education method: Explanation Education comprehension: verbalized understanding   HOME EXERCISE PROGRAM:  Access Code: [email or web address] URL: [email or web address] Date: 08/31/2022 Prepared by: Ria Comment  Exercises - Sit to Stand with Counter Support  - 1 x daily - 7 x weekly - 3 sets - 10 reps - Standing March with Counter Support  - 1 x daily - 7 x weekly - 3 sets - 10 reps - Standing Romberg to 1/2 Tandem Stance  - 1 x daily - 7 x weekly - 3 x 30s with each foot forward hold   ASSESSMENT:  CLINICAL IMPRESSION: Patient arrived to physical therapy highly motivated. Today's session with a main focus on LE strengthening and balance. Patient demonstrated improvements in static balance with arms reaching forward. Patient still challenged with semi tandem stances with LLE in front. Patient tolerated resisted walking with nautilus without major LOB; minor lateral postural sway secondary to small BOS. Pt performed walking with PT facilitating large arm swings to improve gait pattern; improved stepping pattern and trunk rotation with external cue. No updates to HEP this session; pt endorsed improvements in static balance at home. PT plan to continue progressing static and dynamic balance activities. Pt will continue to benefit from skilled physical therapy to address deficits in strength, balance, and mobility in order to return to full function at home.  OBJECTIVE IMPAIRMENTS: decreased activity tolerance, decreased balance, decreased coordination, decreased endurance, decreased mobility, difficulty  walking, decreased ROM, decreased strength, and impaired sensation.   ACTIVITY LIMITATIONS: standing, squatting, stairs, transfers, and bed mobility  PARTICIPATION LIMITATIONS: laundry, driving, and community activity  PERSONAL FACTORS: Age, Fitness, Past/current experiences, and 3+ comorbidities: Atrial Fibrillation, CAD, Parkinson's,   are also affecting patient's functional outcome.   REHAB POTENTIAL: Good  CLINICAL DECISION MAKING: Evolving/moderate complexity  EVALUATION COMPLEXITY: High   GOALS: Goals reviewed with patient? No  SHORT TERM GOALS: Target date:   Pt will be independent with HEP in order to improve strength and balance in order to decrease fall risk and improve function at home. Baseline:  Goal status: INITIAL   LONG TERM GOALS: Target date: 12/14/2022   Pt will increase FOTO to at least 64 to demonstrate significant improvement in function at home related to balance  Baseline: 08/08: 54 Goal status: INITIAL  2.  Pt will improve BERG by at least 3 points in order to demonstrate clinically significant improvement in balance.   Baseline: 08/08: 27; Goal status: INITIAL  3. Pt will decrease TUG to below 14 seconds/decrease in order to demonstrate decreased fall risk.  Baseline: 08/06: 21.9; Goal status: INITIAL  4. Pt will decrease 5TSTS by at least 3 seconds in order to demonstrate clinically significant improvement in LE strength Baseline: 08/08: 26s; Goal status: INITIAL  5. Pt will improve ABC by at least 13% in order to demonstrate clinically significant improvement in balance confidence. Baseline: 08/08: 63% Goal status: INITIAL  PLAN: PT FREQUENCY: 2x/week  PT DURATION: 8 weeks  PLANNED INTERVENTIONS: Therapeutic exercises, Therapeutic activity, Neuromuscular re-education, Balance training, Gait training,  Patient/Family education, Self Care, Joint mobilization, Joint manipulation, Vestibular training, Canalith repositioning, Orthotic/Fit  training, DME instructions, Dry Needling, Electrical stimulation, Spinal manipulation, Spinal mobilization, Cryotherapy, Moist heat, Taping, Traction, Ultrasound, Ionotophoresis 4mg /ml Dexamethasone, Manual therapy, and Re-evaluation.  PLAN FOR NEXT SESSION:  Continue progressing strength and balance program. Review and modify HEP   Jason D Huprich PT, DPT, GCS  Carmelo Reidel, SPT

## 2022-09-25 ENCOUNTER — Ambulatory Visit: Payer: Medicare HMO | Attending: Neurology

## 2022-09-25 DIAGNOSIS — R2681 Unsteadiness on feet: Secondary | ICD-10-CM | POA: Diagnosis not present

## 2022-09-25 DIAGNOSIS — R2689 Other abnormalities of gait and mobility: Secondary | ICD-10-CM | POA: Insufficient documentation

## 2022-09-25 DIAGNOSIS — M6281 Muscle weakness (generalized): Secondary | ICD-10-CM | POA: Diagnosis not present

## 2022-09-25 NOTE — Therapy (Addendum)
OUTPATIENT PHYSICAL THERAPY BALANCE TREATMENT  Patient Name: Daniel [name] MRN: [id number] DOB:1940-12-22, 82 y.o., male Today's Date: 09/25/2022  END OF SESSION:  PT End of Session - 09/25/22 1105     Visit Number 9    Number of Visits 25    Date for PT Re-Evaluation 11/20/22    Authorization Type eval: 08/28/22    PT Start Time 1100    PT Stop Time 1145    PT Time Calculation (min) 45 min    Equipment Utilized During Treatment Gait belt    Activity Tolerance Patient tolerated treatment well    Behavior During Therapy WFL for tasks assessed/performed            Past Medical History:  Diagnosis Date   AF (atrial fibrillation) (HCC)    B12 deficiency    Coronary artery disease    DH (dermatitis herpetiformis)    Dysphagia    Hypertension    Presence of permanent cardiac pacemaker    2006   Primary parkinsonism    Prostate cancer (HCC) 2014   Past Surgical History:  Procedure Laterality Date   APPENDECTOMY     CARDIAC CATHETERIZATION     CATARACT EXTRACTION W/PHACO Left 05/04/2020   Procedure: CATARACT EXTRACTION PHACO AND INTRAOCULAR LENS PLACEMENT (IOC) LEFT  6.76 00:59.9 11.3%;  Surgeon: Lockie Mola, MD;  Location: [facility];  Service: Ophthalmology;  Laterality: Left;   CATARACT EXTRACTION W/PHACO Right 05/18/2020   Procedure: CATARACT EXTRACTION PHACO AND INTRAOCULAR LENS PLACEMENT (IOC) RIGHT 15.56 01:52.6 13.8%;  Surgeon: Lockie Mola, MD;  Location: [facility];  Service: Ophthalmology;  Laterality: Right;   COLONOSCOPY  2008, 2014   CYSTOSCOPY WITH LITHOLAPAXY N/A 09/17/2019   Procedure: CYSTOSCOPY WITH LITHOLAPAXY;  Surgeon: Orson Ape, MD;  Location: [facility];  Service: Urology;  Laterality: N/A;   ESOPHAGOGASTRODUODENOSCOPY (EGD) WITH PROPOFOL N/A 10/13/2015   Procedure: ESOPHAGOGASTRODUODENOSCOPY (EGD) WITH PROPOFOL;  Surgeon: Christena Deem, MD;  Location: [facility];  Service: Endoscopy;  Laterality: N/A;    FASCIOTOMY Left 01/30/2019   Procedure: I&D BELOW FASCIA FOOT SINGLE LEFT;  Surgeon: Gwyneth Revels, DPM;  Location: [facility];  Service: Podiatry;  Laterality: Left;   GREEN LIGHT LASER TURP (TRANSURETHRAL RESECTION OF PROSTATE N/A 09/17/2019   Procedure: GREEN LIGHT LASER TURP (TRANSURETHRAL RESECTION OF PROSTATE;  Surgeon: Orson Ape, MD;  Location: [facility];  Service: Urology;  Laterality: N/A;   PACEMAKER PLACEMENT  2006   There are no problems to display for this patient.   PCP: Jerl Mina, MD  REFERRING PROVIDER: Lonell Face, MD   REFERRING DIAG: Primary Parkinsonism   RATIONALE FOR EVALUATION AND TREATMENT: Rehabilitation  THERAPY DIAG: Muscle weakness (generalized)  Unsteadiness on feet  ONSET DATE: 07/15/2022  FOLLOW-UP APPT SCHEDULED WITH REFERRING PROVIDER:  Didn't Address    SUBJECTIVE:  SUBJECTIVE STATEMENT:  Balance Impairment & Parkinson's  PERTINENT HISTORY:  Patient reports he's had a dozen falls within the last year; four occurred since his last visit to ED 07/15/2022. Reports spinning with bending over to perform laundry. He fell in his churches bathroom and hit his head. Patient had a laceration on his left ear which was repaired and his was discharged after further evaluation. Pt diagnosed with Parkinson's Disease. He goes to [employer]. He would like to be able to move without fear of falling & get out of bed without any help.    Pain: No Numbness/Tingling: Yes Numbness present BLE, below knee down to plantar aspect of the foot.  Focal Weakness: No Recent changes in overall health/medication: No Prior history of physical therapy for balance:  No Dominant hand: right  Imaging: Yes, Pt reports imaging negative for hematoma, hemorrhaging Red flags:  Negative for bowel/bladder changes, saddle paresthesia, personal history of cancer, h/o spinal tumors, h/o compression fx, h/o abdominal aneurysm, abdominal pain, chills/fever, night sweats, nausea, vomiting, unrelenting pain, first onset of insidious LBP <20 y/o  PRECAUTIONS: Fall  WEIGHT BEARING RESTRICTIONS: No FALLS: Has patient fallen in last 6 months? Yes. Number of falls 4 , Directional pattern for falls: Yes Backwards, turns and falls.  Living Environment Lives with: lives with their spouse Lives in: House/apartment Stairs: No Has following equipment at home: Single point cane and Environmental consultant - 2 wheeled  Prior level of function: Independent and Independent with household mobility with device  Occupational demands: Retired  Hobbies: TEFL teacher, [employer] 1x/wk  Patient Goals: He would like to be able to move without fear of falling & get out of bed without any help.   OBJECTIVE:   Patient Surveys  FOTO: 88, Predicted to improve 64 (Visit 19) ABC: Deferred  Cognition Patient is oriented to person, place, and time.  Recent memory is intact.  Remote memory is intact.  Attention span and concentration are intact.  Expressive speech is intact, pt speech is slightly muffled/hyphophonic.  Patient's fund of knowledge is within normal limits for educational level.   Gross Musculoskeletal Assessment Tremor: None Bulk: Normal Tone: Normal "Large mass" in posterior upper thoracic back; pt noted no pain to palpation. MD aware  Posture: Mildly kyphotic in thoracic spine  AROM AROM (Normal range in degrees) AROM   Lumbar   Flexion (65)   Extension (30)   Right lateral flexion (25)   Left lateral flexion (25)   Right rotation (30)   Left rotation (30)       Hip Right Left  Flexion (125)    Extension (15)    Abduction (40)    Adduction     Internal Rotation (45)    External Rotation (45)        Knee    Flexion (135)    Extension (0)        Ankle     Dorsiflexion (20)    Plantarflexion (50)    Inversion (35)    Eversion (15)    (* = pain; Blank rows = not tested)  LE MMT: MMT (out of 5) Right  Left   Hip flexion 4+ 4+  Hip extension    Hip abduction    Hip adduction    Hip internal rotation 4 4  Hip external rotation 4 4  Knee flexion 4- 4-  Knee extension 4- 4-  Ankle dorsiflexion 4- 4-  Ankle plantarflexion    Ankle inversion  Ankle eversion    (* = pain; Blank rows = not tested)  Sensation Grossly intact to light touch throughout bilateral LEs as determined by testing dermatomes L2-S2. Proprioception, stereognosis, and hot/cold testing deferred on this date.  Sensation is slightly more Right > Left L4-5, S1   Reflexes Deferred  Cranial Nerves Deferred  Coordination/Cerebellar Finger to Nose: Slight Deviation from finger, more impaired noted on left more than right  Heel to Shin: WNL Rapid alternating movements: WNL Finger Opposition: WNL Pronator Drift: Negative  Bed mobility: Supine to sit CGA, pt reported assistance from spouse to return to seated position  Transfers: Assistive device utilized: Environmental consultant - 2 wheeled  Sit to stand: Min A, MinA for power up phase only  Stand to sit: CGA Chair to chair: Deferred Floor: Deferred  Curb:  Level of Assistance: Deferred Assistive device utilized: Deferred Curb Comments: Deferred  Stairs: Deferred  Gait: Gait pattern: step to pattern, decreased arm swing- Right, decreased arm swing- Left, decreased step length- Right, decreased step length- Left, and decreased stride length Distance walked: 31m Assistive device utilized: Walker - 2 wheeled Level of assistance: Modified independence Comments: Patient presents with decreased step length bilaterally;  Functional Outcome Measures  Results Comments  BERG    DGI    FGA    TUG 21.9 seconds Required verbal cues, assistance for transfer    5TSTS    6 Minute Walk Test    10 Meter Gait Speed  Self-selected: 11.3s = 0.88 m/s; With front wheeled walker, slightly below full community ambulation speed  (Blank rows = not tested)  VESTIBULAR TESTING Modified Dix-Hallpike R: negative, L: Negative    TODAY'S TREATMENT    SUBJECTIVE: Patient reports feeling well today. He endorsed successfully performed semi-tandem stance three times out of five trials. Patient reports adherence with HEP. No further questions or concerns.   PAIN: No resting pain  Ther-ex NuStep Level 1-4 x 10 min; warm up and focus on muscular endurance and interval history (Settings: Seat 15, Arm 13); Sit to Stand with UE clapping 1 x 10;  Single UE Sit to stand (min guard for power up) 1 x 10;  Neuromuscular Re-ed  Rhomberg, Semi Tandem Stance, Eyes Open 2 x 30s each direction ; Semi Tandem with alternative LE in front, with BUE reaching forward 2 x 30s;  Standing Staggered Stance with alternative LE in front BUE reaching forward and manual pertubation 2 x 30s;  Forward and lateral stepping over 6" obstacles  Forward Stepping over 2 x 6" obstacles, bending and reaching tasks x multiple trials (difficult with item on floor);  Practiced TUG at multiple lengths:  Trial 1 18' TUG: 18.8s Trial 2 10' TUG: 10.81s  Not Performed: Airex Static Balance Progression 3 x 10s;   Airex Rhomberg 3 x 10s; Cross Stepping Left and Right  x multiple legnths; Cross Stepping Left and Right with Boxing Punches x multiple lengths Resisted Forwards and Backwards Walking in // Bars, unsteady with backwards walking;  Seated Long Arc Quads 2 x 10 5# Ankle Weight (AW); Standing Marches with 3# AW 2 x 10; Step Ups with 3# AW 2 x 10; Step Ups with Claps BLE 2 x 10s; Stepping on Airex 2 x 10;  Stepping Over 6" Hurdles in // bars with 3# AW, 4 x multiple lengths; Semi-Tandem Stance Level Ground 3 x 10s ; Semi-Tandem with Reaching task BLE in front 2 x multiple lengths; Sitting with Large Amplitude stomp and clap 2 x 10 BLE Static  Stance, Level Surface with Head Turns (HT) 2 x 15s; Walking in // bars with HT 2 x multiple lengths;  Walking with Weaving through 6 cones x 4;  Walking with PT facilitated Arm Swing in hallway ~436ft; Weaving Through 6 Cones with 6" hurdles x 4;  Weaving through 6 Cones with 12" hurdles x 4;   PATIENT EDUCATION:  Education details: Plan of Care  Person educated: Patient and Spouse Education method: Explanation Education comprehension: verbalized understanding   HOME EXERCISE PROGRAM:  Access Code: [email or web address] URL: [email or web address] Date: 08/31/2022 Prepared by: Ria Comment  Exercises - Sit to Stand with Counter Support  - 1 x daily - 7 x weekly - 3 sets - 10 reps - Standing March with Counter Support  - 1 x daily - 7 x weekly - 3 sets - 10 reps - Standing Romberg to 1/2 Tandem Stance  - 1 x daily - 7 x weekly - 3 x 30s with each foot forward hold   ASSESSMENT:  CLINICAL IMPRESSION: Patient arrived to physical therapy highly motivated. Today's session with a main focus on improving balance and LE strength. Patient demonstrated improvements with sit to stand technique; still required intermittent verbal cues for power up. Pt practiced partial squatting with reaching in order to pick up items without LOB. He demonstrated ability to successfully reach and grab items 6" from floor level; he is still challenged with items at ground level. Patient able to perform static balance in rhomberg and half-tandem without minor LOB; challenged with external perturbations. No updates to HEP this session; pt endorsed improvements in static balance at home. PT plan to continue progressing static and dynamic balance activities. Pt will continue to benefit from skilled physical therapy to address deficits in strength, balance, and mobility in order to return to full function at home.  OBJECTIVE IMPAIRMENTS: decreased activity tolerance, decreased balance, decreased coordination,  decreased endurance, decreased mobility, difficulty walking, decreased ROM, decreased strength, and impaired sensation.   ACTIVITY LIMITATIONS: standing, squatting, stairs, transfers, and bed mobility  PARTICIPATION LIMITATIONS: laundry, driving, and community activity  PERSONAL FACTORS: Age, Fitness, Past/current experiences, and 3+ comorbidities: Atrial Fibrillation, CAD, Parkinson's,   are also affecting patient's functional outcome.   REHAB POTENTIAL: Good  CLINICAL DECISION MAKING: Evolving/moderate complexity  EVALUATION COMPLEXITY: High   GOALS: Goals reviewed with patient? No  SHORT TERM GOALS: Target date:   Pt will be independent with HEP in order to improve strength and balance in order to decrease fall risk and improve function at home. Baseline:  Goal status: INITIAL   LONG TERM GOALS: Target date: 12/18/2022   Pt will increase FOTO to at least 64 to demonstrate significant improvement in function at home related to balance  Baseline: 08/08: 54 Goal status: INITIAL  2.  Pt will improve BERG by at least 3 points in order to demonstrate clinically significant improvement in balance.   Baseline: 08/08: 27; Goal status: INITIAL  3. Pt will decrease TUG to below 14 seconds/decrease in order to demonstrate decreased fall risk.  Baseline: 08/06: 21.9; Goal status: INITIAL  4. Pt will decrease 5TSTS by at least 3 seconds in order to demonstrate clinically significant improvement in LE strength Baseline: 08/08: 26s; Goal status: INITIAL  5. Pt will improve ABC by at least 13% in order to demonstrate clinically significant improvement in balance confidence. Baseline: 08/08: 63% Goal status: INITIAL  PLAN: PT FREQUENCY: 2x/week  PT DURATION: 8 weeks  PLANNED INTERVENTIONS: Therapeutic exercises, Therapeutic activity, Neuromuscular re-education, Balance training,  Gait training, Patient/Family education, Self Care, Joint mobilization, Joint manipulation, Vestibular  training, Canalith repositioning, Orthotic/Fit training, DME instructions, Dry Needling, Electrical stimulation, Spinal manipulation, Spinal mobilization, Cryotherapy, Moist heat, Taping, Traction, Ultrasound, Ionotophoresis 4mg /ml Dexamethasone, Manual therapy, and Re-evaluation.  PLAN FOR NEXT SESSION:  Continue progressing strength and balance program. Review and modify HEP   Jason D Huprich PT, DPT, GCS  Corrina Steffensen, SPT

## 2022-09-27 ENCOUNTER — Ambulatory Visit: Payer: Medicare HMO

## 2022-09-27 DIAGNOSIS — M6281 Muscle weakness (generalized): Secondary | ICD-10-CM | POA: Diagnosis not present

## 2022-09-27 DIAGNOSIS — R2689 Other abnormalities of gait and mobility: Secondary | ICD-10-CM | POA: Diagnosis not present

## 2022-09-27 DIAGNOSIS — R2681 Unsteadiness on feet: Secondary | ICD-10-CM | POA: Diagnosis not present

## 2022-09-27 NOTE — Therapy (Addendum)
OUTPATIENT PHYSICAL THERAPY BALANCE TREATMENT/PROGRESS NOTE  Dates of reporting period  08/28/2022   to   09/27/2022   Patient Name: Daniel [name] MRN: [id number] DOB:12/19/1940, 82 y.o., male Today's Date: 09/28/2022  END OF SESSION:  PT End of Session - 09/27/22 1100     Visit Number 10    Number of Visits 25    Date for PT Re-Evaluation 11/20/22    Authorization Type eval: 08/28/22    PT Start Time 1100    PT Stop Time 1145    PT Time Calculation (min) 45 min    Equipment Utilized During Treatment Gait belt    Activity Tolerance Patient tolerated treatment well    Behavior During Therapy WFL for tasks assessed/performed            Past Medical History:  Diagnosis Date   AF (atrial fibrillation) (HCC)    B12 deficiency    Coronary artery disease    DH (dermatitis herpetiformis)    Dysphagia    Hypertension    Presence of permanent cardiac pacemaker    2006   Primary parkinsonism    Prostate cancer (HCC) 2014   Past Surgical History:  Procedure Laterality Date   APPENDECTOMY     CARDIAC CATHETERIZATION     CATARACT EXTRACTION W/PHACO Left 05/04/2020   Procedure: CATARACT EXTRACTION PHACO AND INTRAOCULAR LENS PLACEMENT (IOC) LEFT  6.76 00:59.9 11.3%;  Surgeon: Lockie Mola, MD;  Location: [facility];  Service: Ophthalmology;  Laterality: Left;   CATARACT EXTRACTION W/PHACO Right 05/18/2020   Procedure: CATARACT EXTRACTION PHACO AND INTRAOCULAR LENS PLACEMENT (IOC) RIGHT 15.56 01:52.6 13.8%;  Surgeon: Lockie Mola, MD;  Location: [facility];  Service: Ophthalmology;  Laterality: Right;   COLONOSCOPY  2008, 2014   CYSTOSCOPY WITH LITHOLAPAXY N/A 09/17/2019   Procedure: CYSTOSCOPY WITH LITHOLAPAXY;  Surgeon: Orson Ape, MD;  Location: [facility];  Service: Urology;  Laterality: N/A;   ESOPHAGOGASTRODUODENOSCOPY (EGD) WITH PROPOFOL N/A 10/13/2015   Procedure: ESOPHAGOGASTRODUODENOSCOPY (EGD) WITH PROPOFOL;  Surgeon: Christena Deem,  MD;  Location: [facility];  Service: Endoscopy;  Laterality: N/A;   FASCIOTOMY Left 01/30/2019   Procedure: I&D BELOW FASCIA FOOT SINGLE LEFT;  Surgeon: Gwyneth Revels, DPM;  Location: [facility];  Service: Podiatry;  Laterality: Left;   GREEN LIGHT LASER TURP (TRANSURETHRAL RESECTION OF PROSTATE N/A 09/17/2019   Procedure: GREEN LIGHT LASER TURP (TRANSURETHRAL RESECTION OF PROSTATE;  Surgeon: Orson Ape, MD;  Location: [facility];  Service: Urology;  Laterality: N/A;   PACEMAKER PLACEMENT  2006   There are no problems to display for this patient.   PCP: Jerl Mina, MD  REFERRING PROVIDER: Lonell Face, MD   REFERRING DIAG: Primary Parkinsonism   RATIONALE FOR EVALUATION AND TREATMENT: Rehabilitation  THERAPY DIAG: Muscle weakness (generalized)  Unsteadiness on feet  ONSET DATE: 07/15/2022  FOLLOW-UP APPT SCHEDULED WITH REFERRING PROVIDER:  Didn't Address    SUBJECTIVE:  SUBJECTIVE STATEMENT:  Balance Impairment & Parkinson's  PERTINENT HISTORY:  Patient reports he's had a dozen falls within the last year; four occurred since his last visit to ED 07/15/2022. Reports spinning with bending over to perform laundry. He fell in his churches bathroom and hit his head. Patient had a laceration on his left ear which was repaired and his was discharged after further evaluation. Pt diagnosed with Parkinson's Disease. He goes to [employer]. He would like to be able to move without fear of falling & get out of bed without any help.    Pain: No Numbness/Tingling: Yes Numbness present BLE, below knee down to plantar aspect of the foot.  Focal Weakness: No Recent changes in overall health/medication: No Prior history of physical therapy for balance:  No Dominant hand: right  Imaging: Yes,  Pt reports imaging negative for hematoma, hemorrhaging Red flags: Negative for bowel/bladder changes, saddle paresthesia, personal history of cancer, h/o spinal tumors, h/o compression fx, h/o abdominal aneurysm, abdominal pain, chills/fever, night sweats, nausea, vomiting, unrelenting pain, first onset of insidious LBP <20 y/o  PRECAUTIONS: Fall  WEIGHT BEARING RESTRICTIONS: No FALLS: Has patient fallen in last 6 months? Yes. Number of falls 4 , Directional pattern for falls: Yes Backwards, turns and falls.  Living Environment Lives with: lives with their spouse Lives in: House/apartment Stairs: No Has following equipment at home: Single point cane and Environmental consultant - 2 wheeled  Prior level of function: Independent and Independent with household mobility with device  Occupational demands: Retired  Hobbies: TEFL teacher, [employer] 1x/wk  Patient Goals: He would like to be able to move without fear of falling & get out of bed without any help.   OBJECTIVE:   Patient Surveys  FOTO: 33, Predicted to improve 64 (Visit 19) ABC: Deferred  Cognition Patient is oriented to person, place, and time.  Recent memory is intact.  Remote memory is intact.  Attention span and concentration are intact.  Expressive speech is intact, pt speech is slightly muffled/hyphophonic.  Patient's fund of knowledge is within normal limits for educational level.   Gross Musculoskeletal Assessment Tremor: None Bulk: Normal Tone: Normal "Large mass" in posterior upper thoracic back; pt noted no pain to palpation. MD aware  Posture: Mildly kyphotic in thoracic spine  AROM AROM (Normal range in degrees) AROM   Lumbar   Flexion (65)   Extension (30)   Right lateral flexion (25)   Left lateral flexion (25)   Right rotation (30)   Left rotation (30)       Hip Right Left  Flexion (125)    Extension (15)    Abduction (40)    Adduction     Internal Rotation (45)    External Rotation (45)         Knee    Flexion (135)    Extension (0)        Ankle    Dorsiflexion (20)    Plantarflexion (50)    Inversion (35)    Eversion (15)    (* = pain; Blank rows = not tested)  LE MMT: MMT (out of 5) Right  Left   Hip flexion 4+ 4+  Hip extension    Hip abduction    Hip adduction    Hip internal rotation 4 4  Hip external rotation 4 4  Knee flexion 4- 4-  Knee extension 4- 4-  Ankle dorsiflexion 4- 4-  Ankle plantarflexion    Ankle inversion  Ankle eversion    (* = pain; Blank rows = not tested)  Sensation Grossly intact to light touch throughout bilateral LEs as determined by testing dermatomes L2-S2. Proprioception, stereognosis, and hot/cold testing deferred on this date.  Sensation is slightly more Right > Left L4-5, S1   Reflexes Deferred  Cranial Nerves Deferred  Coordination/Cerebellar Finger to Nose: Slight Deviation from finger, more impaired noted on left more than right  Heel to Shin: WNL Rapid alternating movements: WNL Finger Opposition: WNL Pronator Drift: Negative  Bed mobility: Supine to sit CGA, pt reported assistance from spouse to return to seated position  Transfers: Assistive device utilized: Environmental consultant - 2 wheeled  Sit to stand: Min A, MinA for power up phase only  Stand to sit: CGA Chair to chair: Deferred Floor: Deferred  Curb:  Level of Assistance: Deferred Assistive device utilized: Deferred Curb Comments: Deferred  Stairs: Deferred  Gait: Gait pattern: step to pattern, decreased arm swing- Right, decreased arm swing- Left, decreased step length- Right, decreased step length- Left, and decreased stride length Distance walked: 52m Assistive device utilized: Walker - 2 wheeled Level of assistance: Modified independence Comments: Patient presents with decreased step length bilaterally;  Functional Outcome Measures  Results Comments  BERG    DGI    FGA    TUG 21.9 seconds Required verbal cues, assistance for transfer     5TSTS    6 Minute Walk Test    10 Meter Gait Speed Self-selected: 11.3s = 0.88 m/s; With front wheeled walker, slightly below full community ambulation speed  (Blank rows = not tested)  VESTIBULAR TESTING Modified Dix-Hallpike R: negative, L: Negative    TODAY'S TREATMENT    SUBJECTIVE: Patient reports feeling well today. Patient reported being able to stand from bed x 4 without assistance. He endorses improvements with LE strength. No further questions or concerns.   PAIN: No resting pain   Neuromuscular Re-ed  NuStep Level 2  x 6 min for warm up and interval history;  Boxing Punches in various directions (crossing, uppercut, straight) x 5 min; Outcome Measures Performed:  ABC: 61.8% FOTO: 63.74 5XSTS: 16.43 (with bilateral UE support) BERG: 26 (Challenged with Feet together, tandem stance, turning and picking up objects)  TUG: 8.85s   Not Performed: Airex Static Balance Progression 3 x 10s;   Airex Rhomberg 3 x 10s; Cross Stepping Left and Right  x multiple legnths; Cross Stepping Left and Right with Boxing Punches x multiple lengths Resisted Forwards and Backwards Walking in // Bars, unsteady with backwards walking;  Seated Long Arc Quads 2 x 10 5# Ankle Weight (AW); Standing Marches with 3# AW 2 x 10; Step Ups with 3# AW 2 x 10; Step Ups with Claps BLE 2 x 10s; Stepping on Airex 2 x 10;  Stepping Over 6" Hurdles in // bars with 3# AW, 4 x multiple lengths; Semi-Tandem Stance Level Ground 3 x 10s ; Semi-Tandem with Reaching task BLE in front 2 x multiple lengths; Sitting with Large Amplitude stomp and clap 2 x 10 BLE Static Stance, Level Surface with Head Turns (HT) 2 x 15s; Walking in // bars with HT 2 x multiple lengths;  Walking with Weaving through 6 cones x 4;  Walking with PT facilitated Arm Swing in hallway ~455ft; Weaving Through 6 Cones with 6" hurdles x 4;  Weaving through 6 Cones with 12" hurdles x 4;   PATIENT EDUCATION:  Education details: Plan of  Care  Person educated: Patient and Spouse Education method:  Explanation Education comprehension: verbalized understanding   HOME EXERCISE PROGRAM:  Access Code: [email or web address] URL: [email or web address] Date: 08/31/2022 Prepared by: Ria Comment  Exercises - Sit to Stand with Counter Support  - 1 x daily - 7 x weekly - 3 sets - 10 reps - Standing March with Counter Support  - 1 x daily - 7 x weekly - 3 sets - 10 reps - Standing Romberg to 1/2 Tandem Stance  - 1 x daily - 7 x weekly - 3 x 30s with each foot forward hold   ASSESSMENT:  CLINICAL IMPRESSION: Patient arrived to physical therapy highly motivated. Today's session with a focus on reassessment of progress. Patient's confidence and performance with balance has remained unchanged as he self reported 61.8% on the ABC (previously 63%) and scored 26 on the BERG (previously 27). His FOTO score improved to a 64 indicating that he has improved mobility with functional activities. His 5TSTS and TUG have significantly improved; accomplished both tasks 16.43s and 8.85s respectively. The patient has demonstrated improvements with significant improvements with LE strength and continues to show progress with balance. Tasks such as turning 360, tandem stance, picking up objects and narrow base of support continue to be challenging.  PT plan to continue improving dynamic and static balance. No changes in HEP today. Pt will continue to benefit from skilled physical therapy to address deficits in strength, balance, and mobility in order to return to full function at home.  OBJECTIVE IMPAIRMENTS: decreased activity tolerance, decreased balance, decreased coordination, decreased endurance, decreased mobility, difficulty walking, decreased ROM, decreased strength, and impaired sensation.   ACTIVITY LIMITATIONS: standing, squatting, stairs, transfers, and bed mobility  PARTICIPATION LIMITATIONS: laundry, driving, and community  activity  PERSONAL FACTORS: Age, Fitness, Past/current experiences, and 3+ comorbidities: Atrial Fibrillation, CAD, Parkinson's,   are also affecting patient's functional outcome.   REHAB POTENTIAL: Good  CLINICAL DECISION MAKING: Evolving/moderate complexity  EVALUATION COMPLEXITY: High   GOALS: Goals reviewed with patient? No  SHORT TERM GOALS: Target date:   Pt will be independent with HEP in order to improve strength and balance in order to decrease fall risk and improve function at home. Baseline:  Goal status: PROGRESSING   LONG TERM GOALS: Target date: 12/21/2022   Pt will increase FOTO to at least 64 to demonstrate significant improvement in function at home related to balance  Baseline: 08/08: 54 09/27/2022: 64 Goal status: ACHIEVED  2.  Pt will improve BERG by at least 3 points in order to demonstrate clinically significant improvement in balance.   Baseline: 08/08: 27/56; 09/27/22: 26/56 Goal status: ONGOING  3. Pt will decrease TUG to below 14 seconds/decrease in order to demonstrate decreased fall risk.  Baseline: 08/06: 21.9; 09/27/22: 8.85 Goal status: ACHIEVED  4. Pt will decrease 5TSTS by at least 3 seconds in order to demonstrate clinically significant improvement in LE strength Baseline: 08/08: 26s; 09/27/22: 16.43s Goal status: ACHIEVED  5. Pt will improve ABC by at least 13% in order to demonstrate clinically significant improvement in balance confidence. Baseline: 08/08: 63% 09/27/22: 61.8% Goal status: ONGOING  PLAN: PT FREQUENCY: 2x/week  PT DURATION: 8 weeks  PLANNED INTERVENTIONS: Therapeutic exercises, Therapeutic activity, Neuromuscular re-education, Balance training, Gait training, Patient/Family education, Self Care, Joint mobilization, Joint manipulation, Vestibular training, Canalith repositioning, Orthotic/Fit training, DME instructions, Dry Needling, Electrical stimulation, Spinal manipulation, Spinal mobilization, Cryotherapy, Moist  heat, Taping, Traction, Ultrasound, Ionotophoresis 4mg /ml Dexamethasone, Manual therapy, and Re-evaluation.  PLAN FOR NEXT SESSION:  Continue progressing strength and balance program. Review  and modify HEP   Sharalyn Ink Huprich PT, DPT, GCS  Daniel [name], SPT

## 2022-10-01 DIAGNOSIS — E538 Deficiency of other specified B group vitamins: Secondary | ICD-10-CM | POA: Diagnosis not present

## 2022-10-01 DIAGNOSIS — Q6672 Congenital pes cavus, left foot: Secondary | ICD-10-CM | POA: Diagnosis not present

## 2022-10-01 DIAGNOSIS — R296 Repeated falls: Secondary | ICD-10-CM | POA: Diagnosis not present

## 2022-10-01 DIAGNOSIS — L97521 Non-pressure chronic ulcer of other part of left foot limited to breakdown of skin: Secondary | ICD-10-CM | POA: Diagnosis not present

## 2022-10-01 DIAGNOSIS — G2581 Restless legs syndrome: Secondary | ICD-10-CM | POA: Diagnosis not present

## 2022-10-01 DIAGNOSIS — G20C Parkinsonism, unspecified: Secondary | ICD-10-CM | POA: Diagnosis not present

## 2022-10-01 DIAGNOSIS — R7989 Other specified abnormal findings of blood chemistry: Secondary | ICD-10-CM | POA: Diagnosis not present

## 2022-10-02 ENCOUNTER — Ambulatory Visit: Payer: Medicare HMO

## 2022-10-02 DIAGNOSIS — R2681 Unsteadiness on feet: Secondary | ICD-10-CM

## 2022-10-02 DIAGNOSIS — R2689 Other abnormalities of gait and mobility: Secondary | ICD-10-CM | POA: Diagnosis not present

## 2022-10-02 DIAGNOSIS — M6281 Muscle weakness (generalized): Secondary | ICD-10-CM

## 2022-10-02 NOTE — Therapy (Addendum)
OUTPATIENT PHYSICAL THERAPY BALANCE TREATMENT   Patient Name: Daniel [name] MRN: [id number] DOB:Dec 12, 1940, 82 y.o., male Today's Date: 10/03/2022  END OF SESSION:  PT End of Session - 10/02/22 1050     Visit Number 11    Number of Visits 25    Date for PT Re-Evaluation 11/20/22    Authorization Type eval: 08/28/22    PT Start Time 1100    PT Stop Time 1145    PT Time Calculation (min) 45 min    Equipment Utilized During Treatment Gait belt    Activity Tolerance Patient tolerated treatment well    Behavior During Therapy WFL for tasks assessed/performed            Past Medical History:  Diagnosis Date   AF (atrial fibrillation) (HCC)    B12 deficiency    Coronary artery disease    DH (dermatitis herpetiformis)    Dysphagia    Hypertension    Presence of permanent cardiac pacemaker    2006   Primary parkinsonism    Prostate cancer (HCC) 2014   Past Surgical History:  Procedure Laterality Date   APPENDECTOMY     CARDIAC CATHETERIZATION     CATARACT EXTRACTION W/PHACO Left 05/04/2020   Procedure: CATARACT EXTRACTION PHACO AND INTRAOCULAR LENS PLACEMENT (IOC) LEFT  6.76 00:59.9 11.3%;  Surgeon: Lockie Mola, MD;  Location: [facility];  Service: Ophthalmology;  Laterality: Left;   CATARACT EXTRACTION W/PHACO Right 05/18/2020   Procedure: CATARACT EXTRACTION PHACO AND INTRAOCULAR LENS PLACEMENT (IOC) RIGHT 15.56 01:52.6 13.8%;  Surgeon: Lockie Mola, MD;  Location: [facility];  Service: Ophthalmology;  Laterality: Right;   COLONOSCOPY  2008, 2014   CYSTOSCOPY WITH LITHOLAPAXY N/A 09/17/2019   Procedure: CYSTOSCOPY WITH LITHOLAPAXY;  Surgeon: Orson Ape, MD;  Location: [facility];  Service: Urology;  Laterality: N/A;   ESOPHAGOGASTRODUODENOSCOPY (EGD) WITH PROPOFOL N/A 10/13/2015   Procedure: ESOPHAGOGASTRODUODENOSCOPY (EGD) WITH PROPOFOL;  Surgeon: Christena Deem, MD;  Location: [facility];  Service: Endoscopy;  Laterality: N/A;    FASCIOTOMY Left 01/30/2019   Procedure: I&D BELOW FASCIA FOOT SINGLE LEFT;  Surgeon: Gwyneth Revels, DPM;  Location: [facility];  Service: Podiatry;  Laterality: Left;   GREEN LIGHT LASER TURP (TRANSURETHRAL RESECTION OF PROSTATE N/A 09/17/2019   Procedure: GREEN LIGHT LASER TURP (TRANSURETHRAL RESECTION OF PROSTATE;  Surgeon: Orson Ape, MD;  Location: [facility];  Service: Urology;  Laterality: N/A;   PACEMAKER PLACEMENT  2006   There are no problems to display for this patient.   PCP: Jerl Mina, MD  REFERRING PROVIDER: Lonell Face, MD   REFERRING DIAG: Primary Parkinsonism   RATIONALE FOR EVALUATION AND TREATMENT: Rehabilitation  THERAPY DIAG: Muscle weakness (generalized)  Unsteadiness on feet  Other abnormalities of gait and mobility  ONSET DATE: 07/15/2022  FOLLOW-UP APPT SCHEDULED WITH REFERRING PROVIDER:  Didn't Address    SUBJECTIVE:  SUBJECTIVE STATEMENT:  Balance Impairment & Parkinson's  PERTINENT HISTORY:  Patient reports he's had a dozen falls within the last year; four occurred since his last visit to ED 07/15/2022. Reports spinning with bending over to perform laundry. He fell in his churches bathroom and hit his head. Patient had a laceration on his left ear which was repaired and his was discharged after further evaluation. Pt diagnosed with Parkinson's Disease. He goes to [employer]. He would like to be able to move without fear of falling & get out of bed without any help.    Pain: No Numbness/Tingling: Yes Numbness present BLE, below knee down to plantar aspect of the foot.  Focal Weakness: No Recent changes in overall health/medication: No Prior history of physical therapy for balance:  No Dominant hand: right  Imaging: Yes, Pt reports imaging negative  for hematoma, hemorrhaging Red flags: Negative for bowel/bladder changes, saddle paresthesia, personal history of cancer, h/o spinal tumors, h/o compression fx, h/o abdominal aneurysm, abdominal pain, chills/fever, night sweats, nausea, vomiting, unrelenting pain, first onset of insidious LBP <20 y/o  PRECAUTIONS: Fall  WEIGHT BEARING RESTRICTIONS: No FALLS: Has patient fallen in last 6 months? Yes. Number of falls 4 , Directional pattern for falls: Yes Backwards, turns and falls.  Living Environment Lives with: lives with their spouse Lives in: House/apartment Stairs: No Has following equipment at home: Single point cane and Environmental consultant - 2 wheeled  Prior level of function: Independent and Independent with household mobility with device  Occupational demands: Retired  Hobbies: TEFL teacher, [employer] 1x/wk  Patient Goals: He would like to be able to move without fear of falling & get out of bed without any help.   OBJECTIVE:   Patient Surveys  FOTO: 15, Predicted to improve 64 (Visit 19) ABC: Deferred  Cognition Patient is oriented to person, place, and time.  Recent memory is intact.  Remote memory is intact.  Attention span and concentration are intact.  Expressive speech is intact, pt speech is slightly muffled/hyphophonic.  Patient's fund of knowledge is within normal limits for educational level.   Gross Musculoskeletal Assessment Tremor: None Bulk: Normal Tone: Normal "Large mass" in posterior upper thoracic back; pt noted no pain to palpation. MD aware  Posture: Mildly kyphotic in thoracic spine  AROM AROM (Normal range in degrees) AROM   Lumbar   Flexion (65)   Extension (30)   Right lateral flexion (25)   Left lateral flexion (25)   Right rotation (30)   Left rotation (30)       Hip Right Left  Flexion (125)    Extension (15)    Abduction (40)    Adduction     Internal Rotation (45)    External Rotation (45)        Knee    Flexion (135)     Extension (0)        Ankle    Dorsiflexion (20)    Plantarflexion (50)    Inversion (35)    Eversion (15)    (* = pain; Blank rows = not tested)  LE MMT: MMT (out of 5) Right  Left   Hip flexion 4+ 4+  Hip extension    Hip abduction    Hip adduction    Hip internal rotation 4 4  Hip external rotation 4 4  Knee flexion 4- 4-  Knee extension 4- 4-  Ankle dorsiflexion 4- 4-  Ankle plantarflexion    Ankle inversion  Ankle eversion    (* = pain; Blank rows = not tested)  Sensation Grossly intact to light touch throughout bilateral LEs as determined by testing dermatomes L2-S2. Proprioception, stereognosis, and hot/cold testing deferred on this date.  Sensation is slightly more Right > Left L4-5, S1   Reflexes Deferred  Cranial Nerves Deferred  Coordination/Cerebellar Finger to Nose: Slight Deviation from finger, more impaired noted on left more than right  Heel to Shin: WNL Rapid alternating movements: WNL Finger Opposition: WNL Pronator Drift: Negative  Bed mobility: Supine to sit CGA, pt reported assistance from spouse to return to seated position  Transfers: Assistive device utilized: Environmental consultant - 2 wheeled  Sit to stand: Min A, MinA for power up phase only  Stand to sit: CGA Chair to chair: Deferred Floor: Deferred  Curb:  Level of Assistance: Deferred Assistive device utilized: Deferred Curb Comments: Deferred  Stairs: Deferred  Gait: Gait pattern: step to pattern, decreased arm swing- Right, decreased arm swing- Left, decreased step length- Right, decreased step length- Left, and decreased stride length Distance walked: 29m Assistive device utilized: Walker - 2 wheeled Level of assistance: Modified independence Comments: Patient presents with decreased step length bilaterally;  Functional Outcome Measures  Results Comments  BERG    DGI    FGA    TUG 21.9 seconds Required verbal cues, assistance for transfer    5TSTS    6 Minute Walk Test     10 Meter Gait Speed Self-selected: 11.3s = 0.88 m/s; With front wheeled walker, slightly below full community ambulation speed  (Blank rows = not tested)  VESTIBULAR TESTING Modified Dix-Hallpike R: negative, L: Negative    TODAY'S TREATMENT    SUBJECTIVE: Patient reports feeling well today. Patient reported fall on Thursday following PT session, able to recover but now has minor abrasions and discoloration on RUE and back. No further questions or concerns.    PAIN: No resting pain   Neuromuscular Re-ed  NuStep Level 1-4 (62min:1min on/off)  x 10 min for warm up, power, and interval history;   Semi-Tandem Stance, alternating LE in front 2 x 30s;  Step Taps on stairs, Single UE support 1 x 20; 90 Degree Turns with Bending and Reaching for Cones at different heights x 2 trials;  Forward Walking in Agility Ladder over x 3 6" hurdles, reaching and stacking  x 4 cones; Feet Together, Level Surface with Head turns x 2 min;  Feet parallel, Level Surface, Head Scanning and UE Reaching x 2 min; Sit to stands with over head clapping 2 x 10;  Not Performed: Airex Static Balance Progression 3 x 10s;   Airex Rhomberg 3 x 10s; Cross Stepping Left and Right  x multiple legnths; Cross Stepping Left and Right with Boxing Punches x multiple lengths Resisted Forwards and Backwards Walking in // Bars, unsteady with backwards walking;  Seated Long Arc Quads 2 x 10 5# Ankle Weight (AW); Standing Marches with 3# AW 2 x 10; Step Ups with 3# AW 2 x 10; Step Ups with Claps BLE 2 x 10s; Stepping on Airex 2 x 10;  Stepping Over 6" Hurdles in // bars with 3# AW, 4 x multiple lengths; Semi-Tandem Stance Level Ground 3 x 10s ; Semi-Tandem with Reaching task BLE in front 2 x multiple lengths; Sitting with Large Amplitude stomp and clap 2 x 10 BLE Static Stance, Level Surface with Head Turns (HT) 2 x 15s; Walking in // bars with HT 2 x multiple lengths;  Walking with [employer]  through 6 cones x 4;   Walking with PT facilitated Arm Swing in hallway ~415ft; Weaving Through 6 Cones with 6" hurdles x 4;  Weaving through 6 Cones with 12" hurdles x 4;   PATIENT EDUCATION:  Education details: Plan of Care  Person educated: Patient and Spouse Education method: Explanation Education comprehension: verbalized understanding   HOME EXERCISE PROGRAM:  Access Code: [email or web address] URL: [email or web address] Date: 08/31/2022 Prepared by: Ria Comment  Exercises - Sit to Stand with Counter Support  - 1 x daily - 7 x weekly - 3 sets - 10 reps - Standing March with Counter Support  - 1 x daily - 7 x weekly - 3 sets - 10 reps - Standing Romberg to 1/2 Tandem Stance  - 1 x daily - 7 x weekly - 3 x 30s with each foot forward hold   ASSESSMENT:  CLINICAL IMPRESSION: Patient arrived to physical therapy highly motivated. Today's session with a focus on progressing balance and LE strength. Patient able to demonstrate improvements LE strength; performed sit to stands with minimal to no support from PT. He still struggles with 90 degree turns and utilizes narrow BOS and shuffling pattern to turn. Demonstrated improvements with large stepping pattern with turning tasks in today's session. He still requires verbal cues in order to perform tasks with improved form. PT plan to continue improving dynamic and static balance. No changes in HEP today. Pt will continue to benefit from skilled physical therapy to address deficits in strength, balance, and mobility in order to return to full function at home.  OBJECTIVE IMPAIRMENTS: decreased activity tolerance, decreased balance, decreased coordination, decreased endurance, decreased mobility, difficulty walking, decreased ROM, decreased strength, and impaired sensation.   ACTIVITY LIMITATIONS: standing, squatting, stairs, transfers, and bed mobility  PARTICIPATION LIMITATIONS: laundry, driving, and community activity  PERSONAL FACTORS: Age, Fitness,  Past/current experiences, and 3+ comorbidities: Atrial Fibrillation, CAD, Parkinson's,   are also affecting patient's functional outcome.   REHAB POTENTIAL: Good  CLINICAL DECISION MAKING: Evolving/moderate complexity  EVALUATION COMPLEXITY: High   GOALS: Goals reviewed with patient? No  SHORT TERM GOALS: Target date:   Pt will be independent with HEP in order to improve strength and balance in order to decrease fall risk and improve function at home. Baseline:  Goal status: PROGRESSING   LONG TERM GOALS: Target date: 12/26/2022   Pt will increase FOTO to at least 64 to demonstrate significant improvement in function at home related to balance  Baseline: 08/08: 54 09/27/2022: 64 Goal status: ACHIEVED  2.  Pt will improve BERG by at least 3 points in order to demonstrate clinically significant improvement in balance.   Baseline: 08/08: 27/56; 09/27/22: 26/56 Goal status: ONGOING  3. Pt will decrease TUG to below 14 seconds/decrease in order to demonstrate decreased fall risk.  Baseline: 08/06: 21.9; 09/27/22: 8.85 Goal status: ACHIEVED  4. Pt will decrease 5TSTS by at least 3 seconds in order to demonstrate clinically significant improvement in LE strength Baseline: 08/08: 26s; 09/27/22: 16.43s Goal status: ACHIEVED  5. Pt will improve ABC by at least 13% in order to demonstrate clinically significant improvement in balance confidence. Baseline: 08/08: 63% 09/27/22: 61.8% Goal status: ONGOING  PLAN: PT FREQUENCY: 2x/week  PT DURATION: 8 weeks  PLANNED INTERVENTIONS: Therapeutic exercises, Therapeutic activity, Neuromuscular re-education, Balance training, Gait training, Patient/Family education, Self Care, Joint mobilization, Joint manipulation, Vestibular training, Canalith repositioning, Orthotic/Fit training, DME instructions, Dry Needling, Electrical stimulation, Spinal manipulation, Spinal mobilization, Cryotherapy, Moist heat, Taping, Traction, Ultrasound,  Ionotophoresis  4mg /ml Dexamethasone, Manual therapy, and Re-evaluation.  PLAN FOR NEXT SESSION:  Continue progressing strength and balance program. Review and modify HEP   Jason D Huprich PT, DPT, GCS  Eulas Schweitzer, SPT

## 2022-10-04 ENCOUNTER — Ambulatory Visit: Payer: Medicare HMO

## 2022-10-04 DIAGNOSIS — R2681 Unsteadiness on feet: Secondary | ICD-10-CM

## 2022-10-04 DIAGNOSIS — R2689 Other abnormalities of gait and mobility: Secondary | ICD-10-CM

## 2022-10-04 DIAGNOSIS — M6281 Muscle weakness (generalized): Secondary | ICD-10-CM | POA: Diagnosis not present

## 2022-10-04 NOTE — Therapy (Addendum)
OUTPATIENT PHYSICAL THERAPY BALANCE TREATMENT   Patient Name: Daniel [name] MRN: [id number] DOB:[birth year], 82 y.o., male Today's Date: 10/05/2022  END OF SESSION:  PT End of Session - 10/04/22 1059     Visit Number 12    Number of Visits 25    Date for PT Re-Evaluation 11/20/22    Authorization Type eval: 08/28/22    PT Start Time 1100    PT Stop Time 1145    PT Time Calculation (min) 45 min    Equipment Utilized During Treatment Gait belt    Activity Tolerance Patient tolerated treatment well    Behavior During Therapy WFL for tasks assessed/performed            Past Medical History:  Diagnosis Date   AF (atrial fibrillation) (HCC)    B12 deficiency    Coronary artery disease    DH (dermatitis herpetiformis)    Dysphagia    Hypertension    Presence of permanent cardiac pacemaker    2006   Primary parkinsonism    Prostate cancer (HCC) 2014   Past Surgical History:  Procedure Laterality Date   APPENDECTOMY     CARDIAC CATHETERIZATION     CATARACT EXTRACTION W/PHACO Left 05/04/2020   Procedure: CATARACT EXTRACTION PHACO AND INTRAOCULAR LENS PLACEMENT (IOC) LEFT  6.76 00:59.9 11.3%;  Surgeon: Lockie Mola, MD;  Location: [facility];  Service: Ophthalmology;  Laterality: Left;   CATARACT EXTRACTION W/PHACO Right 05/18/2020   Procedure: CATARACT EXTRACTION PHACO AND INTRAOCULAR LENS PLACEMENT (IOC) RIGHT 15.56 01:52.6 13.8%;  Surgeon: Lockie Mola, MD;  Location: [facility];  Service: Ophthalmology;  Laterality: Right;   COLONOSCOPY  2008, 2014   CYSTOSCOPY WITH LITHOLAPAXY N/A 09/17/2019   Procedure: CYSTOSCOPY WITH LITHOLAPAXY;  Surgeon: Orson Ape, MD;  Location: [facility];  Service: Urology;  Laterality: N/A;   ESOPHAGOGASTRODUODENOSCOPY (EGD) WITH PROPOFOL N/A 10/13/2015   Procedure: ESOPHAGOGASTRODUODENOSCOPY (EGD) WITH PROPOFOL;  Surgeon: Christena Deem, MD;  Location: [facility];  Service: Endoscopy;  Laterality: N/A;    FASCIOTOMY Left 01/30/2019   Procedure: I&D BELOW FASCIA FOOT SINGLE LEFT;  Surgeon: Gwyneth Revels, DPM;  Location: [facility];  Service: Podiatry;  Laterality: Left;   GREEN LIGHT LASER TURP (TRANSURETHRAL RESECTION OF PROSTATE N/A 09/17/2019   Procedure: GREEN LIGHT LASER TURP (TRANSURETHRAL RESECTION OF PROSTATE;  Surgeon: Orson Ape, MD;  Location: [facility];  Service: Urology;  Laterality: N/A;   PACEMAKER PLACEMENT  2006   There are no problems to display for this patient.   PCP: Jerl Mina, MD  REFERRING PROVIDER: Lonell Face, MD   REFERRING DIAG: Primary Parkinsonism   RATIONALE FOR EVALUATION AND TREATMENT: Rehabilitation  THERAPY DIAG: Muscle weakness (generalized)  Unsteadiness on feet  Imbalance  ONSET DATE: 07/15/2022  FOLLOW-UP APPT SCHEDULED WITH REFERRING PROVIDER:  Didn't Address    SUBJECTIVE:  SUBJECTIVE STATEMENT:  Balance Impairment & Parkinson's  PERTINENT HISTORY:  Patient reports he's had a dozen falls within the last year; four occurred since his last visit to ED 07/15/2022. Reports spinning with bending over to perform laundry. He fell in his churches bathroom and hit his head. Patient had a laceration on his left ear which was repaired and his was discharged after further evaluation. Pt diagnosed with Parkinson's Disease. He goes to [employer]. He would like to be able to move without fear of falling & get out of bed without any help.    Pain: No Numbness/Tingling: Yes Numbness present BLE, below knee down to plantar aspect of the foot.  Focal Weakness: No Recent changes in overall health/medication: No Prior history of physical therapy for balance:  No Dominant hand: right  Imaging: Yes, Pt reports imaging negative for hematoma, hemorrhaging Red  flags: Negative for bowel/bladder changes, saddle paresthesia, personal history of cancer, h/o spinal tumors, h/o compression fx, h/o abdominal aneurysm, abdominal pain, chills/fever, night sweats, nausea, vomiting, unrelenting pain, first onset of insidious LBP <20 y/o  PRECAUTIONS: Fall  WEIGHT BEARING RESTRICTIONS: No FALLS: Has patient fallen in last 6 months? Yes. Number of falls 4 , Directional pattern for falls: Yes Backwards, turns and falls.  Living Environment Lives with: lives with their spouse Lives in: House/apartment Stairs: No Has following equipment at home: Single point cane and Environmental consultant - 2 wheeled  Prior level of function: Independent and Independent with household mobility with device  Occupational demands: Retired  Hobbies: TEFL teacher, [employer] 1x/wk  Patient Goals: He would like to be able to move without fear of falling & get out of bed without any help.   OBJECTIVE:   Patient Surveys  FOTO: 37, Predicted to improve 64 (Visit 19) ABC: Deferred  Cognition Patient is oriented to person, place, and time.  Recent memory is intact.  Remote memory is intact.  Attention span and concentration are intact.  Expressive speech is intact, pt speech is slightly muffled/hyphophonic.  Patient's fund of knowledge is within normal limits for educational level.   Gross Musculoskeletal Assessment Tremor: None Bulk: Normal Tone: Normal "Large mass" in posterior upper thoracic back; pt noted no pain to palpation. MD aware  Posture: Mildly kyphotic in thoracic spine  AROM AROM (Normal range in degrees) AROM   Lumbar   Flexion (65)   Extension (30)   Right lateral flexion (25)   Left lateral flexion (25)   Right rotation (30)   Left rotation (30)       Hip Right Left  Flexion (125)    Extension (15)    Abduction (40)    Adduction     Internal Rotation (45)    External Rotation (45)        Knee    Flexion (135)    Extension (0)        Ankle     Dorsiflexion (20)    Plantarflexion (50)    Inversion (35)    Eversion (15)    (* = pain; Blank rows = not tested)  LE MMT: MMT (out of 5) Right  Left   Hip flexion 4+ 4+  Hip extension    Hip abduction    Hip adduction    Hip internal rotation 4 4  Hip external rotation 4 4  Knee flexion 4- 4-  Knee extension 4- 4-  Ankle dorsiflexion 4- 4-  Ankle plantarflexion    Ankle inversion  Ankle eversion    (* = pain; Blank rows = not tested)  Sensation Grossly intact to light touch throughout bilateral LEs as determined by testing dermatomes L2-S2. Proprioception, stereognosis, and hot/cold testing deferred on this date.  Sensation is slightly more Right > Left L4-5, S1   Reflexes Deferred  Cranial Nerves Deferred  Coordination/Cerebellar Finger to Nose: Slight Deviation from finger, more impaired noted on left more than right  Heel to Shin: WNL Rapid alternating movements: WNL Finger Opposition: WNL Pronator Drift: Negative  Bed mobility: Supine to sit CGA, pt reported assistance from spouse to return to seated position  Transfers: Assistive device utilized: Environmental consultant - 2 wheeled  Sit to stand: Min A, MinA for power up phase only  Stand to sit: CGA Chair to chair: Deferred Floor: Deferred  Curb:  Level of Assistance: Deferred Assistive device utilized: Deferred Curb Comments: Deferred  Stairs: Deferred  Gait: Gait pattern: step to pattern, decreased arm swing- Right, decreased arm swing- Left, decreased step length- Right, decreased step length- Left, and decreased stride length Distance walked: 89m Assistive device utilized: Walker - 2 wheeled Level of assistance: Modified independence Comments: Patient presents with decreased step length bilaterally;  Functional Outcome Measures  Results Comments  BERG    DGI    FGA    TUG 21.9 seconds Required verbal cues, assistance for transfer    5TSTS    6 Minute Walk Test    10 Meter Gait Speed  Self-selected: 11.3s = 0.88 m/s; With front wheeled walker, slightly below full community ambulation speed  (Blank rows = not tested)  VESTIBULAR TESTING Modified Dix-Hallpike R: negative, L: Negative    TODAY'S TREATMENT    SUBJECTIVE: Patient reports feeling mildly unsteady today. Pt denies falls but a bout of vertigo occurred when getting out of bed this morning.  No further questions or concerns.    PAIN: No resting pain   Neuromuscular Re-ed  NuStep Level 1-4 (49min:1min on/off)  x 10 min for warm up, power, and interval history;  Staggered Sit to Stands with Single or No UE support 2 x 10;  Static Balance Exercises:    Feet Together Stance on Airex added reaching task with second se. 2 x 30s Semi-Tandem Stance on Airex, alternating LE in front 2 x 30s;  Cross over stepping in // bars left over right and right over left  x 2 trials ea direction Forward and Retro walking on Grass 2 x 46m;  Stepping on/off over 6" hurdle Compliant surface on Grass 2 x 10 BLE;    Canalith Repositioning Treatment Dix Hallpike R: Negative  Dix Hallpike L: Positive for mild vertigo and ageotropic pure R horizontal nystagmus lasting >1 minute;. Right Roll Test: Positive for mild vertigo and ageotropic pure L horizontal nystagmus lasting >1 minute;. Left Roll Test: Positive for mild vertigo and ageotropic pure R horizontal nystagmus lasting >1 minute (more vigorous);Marland Kitchen  Pt treated with 2 bouts of Gufoni Maneuver for presumed R horizontal cupulolithiasis. 30s hold in first position followed by 1 minute nose down. Retesting after both maneuvers remains positive for both vertigo and appropriate ageotropic nystagmus.     Not Performed: Airex Static Balance Progression 3 x 10s;   Airex Rhomberg 3 x 10s; Cross Stepping Left and Right  x multiple legnths; Cross Stepping Left and Right with Boxing Punches x multiple lengths Resisted Forwards and Backwards Walking in // Bars, unsteady with backwards  walking;  Seated Long Arc Quads 2 x 10 5# Ankle Weight (AW);  Standing Marches with 3# AW 2 x 10; Step Ups with 3# AW 2 x 10; Step Ups with Claps BLE 2 x 10s; Stepping on Airex 2 x 10;  Stepping Over 6" Hurdles in // bars with 3# AW, 4 x multiple lengths; Semi-Tandem Stance Level Ground 3 x 10s ; Semi-Tandem with Reaching task BLE in front 2 x multiple lengths; Sitting with Large Amplitude stomp and clap 2 x 10 BLE Static Stance, Level Surface with Head Turns (HT) 2 x 15s; Walking in // bars with HT 2 x multiple lengths;  Walking with Weaving through 6 cones x 4;  Walking with PT facilitated Arm Swing in hallway ~440ft; Weaving Through 6 Cones with 6" hurdles x 4;  Weaving through 6 Cones with 12" hurdles x 4;   PATIENT EDUCATION:  Education details: Plan of Care  Person educated: Patient and Spouse Education method: Explanation Education comprehension: verbalized understanding   HOME EXERCISE PROGRAM:  Access Code: [email or web address] URL: [email or web address] Date: 10/05/2022 Prepared by: Ria Comment  Exercises - Sit to Stand with Counter Support  - 1 x daily - 7 x weekly - 3 sets - 10 reps - Standing March with Counter Support  - 1 x daily - 7 x weekly - 3 sets - 10 reps - Standing Romberg to 1/2 Tandem Stance  - 1 x daily - 7 x weekly - 3 x 30s with each foot forward hold   ASSESSMENT:  CLINICAL IMPRESSION: Patient arrived to physical therapy highly motivated. Today's session with a focus on progressing balance and LE strength. Patient presented with increased unsteadiness throughout physical therapy session today. Lateral and posterior sway noted throughout balance training in grass. Pt required minguard to min assist in order to maintain balance stepping over hurdles. PT screened patient for BPPV; pt presented with ageotrophic horizontal nystagmus with roll test and reported minor vertigo. Pt treated with 2 bouts of Gufoni Maneuver for presumed R horizontal  cupulolithiasis. 30s hold in first position followed by 1 minute nose down. Retesting after both maneuvers remains positive for both vertigo and appropriate ageotropic nystagmus. No changes in HEP today. Pt will continue to benefit from skilled physical therapy to address deficits in strength, balance, and mobility in order to return to full function at home.  OBJECTIVE IMPAIRMENTS: decreased activity tolerance, decreased balance, decreased coordination, decreased endurance, decreased mobility, difficulty walking, decreased ROM, decreased strength, and impaired sensation.   ACTIVITY LIMITATIONS: standing, squatting, stairs, transfers, and bed mobility  PARTICIPATION LIMITATIONS: laundry, driving, and community activity  PERSONAL FACTORS: Age, Fitness, Past/current experiences, and 3+ comorbidities: Atrial Fibrillation, CAD, Parkinson's,   are also affecting patient's functional outcome.   REHAB POTENTIAL: Good  CLINICAL DECISION MAKING: Evolving/moderate complexity  EVALUATION COMPLEXITY: High   GOALS: Goals reviewed with patient? No  SHORT TERM GOALS: Target date:   Pt will be independent with HEP in order to improve strength and balance in order to decrease fall risk and improve function at home. Baseline:  Goal status: PROGRESSING   LONG TERM GOALS: Target date: 12/28/2022   Pt will increase FOTO to at least 64 to demonstrate significant improvement in function at home related to balance  Baseline: 08/08: 54 09/27/2022: 64 Goal status: ACHIEVED  2.  Pt will improve BERG by at least 3 points in order to demonstrate clinically significant improvement in balance.   Baseline: 08/08: 27/56; 09/27/22: 26/56 Goal status: ONGOING  3. Pt will decrease TUG to below 14 seconds/decrease in order to demonstrate decreased fall risk.  Baseline: 08/06: 21.9; 09/27/22: 8.85 Goal status: ACHIEVED  4. Pt will decrease 5TSTS by at least 3 seconds in order to demonstrate clinically significant  improvement in LE strength Baseline: 08/08: 26s; 09/27/22: 16.43s Goal status: ACHIEVED  5. Pt will improve ABC by at least 13% in order to demonstrate clinically significant improvement in balance confidence. Baseline: 08/08: 63% 09/27/22: 61.8% Goal status: ONGOING  PLAN: PT FREQUENCY: 2x/week  PT DURATION: 8 weeks  PLANNED INTERVENTIONS: Therapeutic exercises, Therapeutic activity, Neuromuscular re-education, Balance training, Gait training, Patient/Family education, Self Care, Joint mobilization, Joint manipulation, Vestibular training, Canalith repositioning, Orthotic/Fit training, DME instructions, Dry Needling, Electrical stimulation, Spinal manipulation, Spinal mobilization, Cryotherapy, Moist heat, Taping, Traction, Ultrasound, Ionotophoresis 4mg /ml Dexamethasone, Manual therapy, and Re-evaluation.  PLAN FOR NEXT SESSION:  BPPV retesting, continue progressing strength and balance program. Review and modify HEP   Sharalyn Ink Huprich PT, DPT, GCS  Delila Kuklinski, SPT

## 2022-10-08 DIAGNOSIS — H401122 Primary open-angle glaucoma, left eye, moderate stage: Secondary | ICD-10-CM | POA: Diagnosis not present

## 2022-10-08 DIAGNOSIS — H524 Presbyopia: Secondary | ICD-10-CM | POA: Diagnosis not present

## 2022-10-08 DIAGNOSIS — Z961 Presence of intraocular lens: Secondary | ICD-10-CM | POA: Diagnosis not present

## 2022-10-08 DIAGNOSIS — H5203 Hypermetropia, bilateral: Secondary | ICD-10-CM | POA: Diagnosis not present

## 2022-10-08 DIAGNOSIS — H52223 Regular astigmatism, bilateral: Secondary | ICD-10-CM | POA: Diagnosis not present

## 2022-10-08 DIAGNOSIS — H35033 Hypertensive retinopathy, bilateral: Secondary | ICD-10-CM | POA: Diagnosis not present

## 2022-10-08 DIAGNOSIS — H401111 Primary open-angle glaucoma, right eye, mild stage: Secondary | ICD-10-CM | POA: Diagnosis not present

## 2022-10-09 ENCOUNTER — Ambulatory Visit: Payer: Medicare HMO

## 2022-10-09 DIAGNOSIS — R2681 Unsteadiness on feet: Secondary | ICD-10-CM | POA: Diagnosis not present

## 2022-10-09 DIAGNOSIS — R2689 Other abnormalities of gait and mobility: Secondary | ICD-10-CM | POA: Diagnosis not present

## 2022-10-09 DIAGNOSIS — M6281 Muscle weakness (generalized): Secondary | ICD-10-CM | POA: Diagnosis not present

## 2022-10-09 NOTE — Therapy (Cosign Needed Addendum)
OUTPATIENT PHYSICAL THERAPY BALANCE TREATMENT   Patient Name: Daniel [name] MRN: [id number] DOB:05/10/1940, 82 y.o., male Today's Date: 10/10/2022  END OF SESSION:  PT End of Session - 10/09/22 1053     Visit Number 13    Number of Visits 25    Date for PT Re-Evaluation 11/20/22    Authorization Type eval: 08/28/22    PT Start Time 1100    PT Stop Time 1145    PT Time Calculation (min) 45 min    Equipment Utilized During Treatment Gait belt    Activity Tolerance Patient tolerated treatment well    Behavior During Therapy WFL for tasks assessed/performed            Past Medical History:  Diagnosis Date   AF (atrial fibrillation) (HCC)    B12 deficiency    Coronary artery disease    DH (dermatitis herpetiformis)    Dysphagia    Hypertension    Presence of permanent cardiac pacemaker    2006   Primary parkinsonism    Prostate cancer (HCC) 2014   Past Surgical History:  Procedure Laterality Date   APPENDECTOMY     CARDIAC CATHETERIZATION     CATARACT EXTRACTION W/PHACO Left 05/04/2020   Procedure: CATARACT EXTRACTION PHACO AND INTRAOCULAR LENS PLACEMENT (IOC) LEFT  6.76 00:59.9 11.3%;  Surgeon: Lockie Mola, MD;  Location: [facility];  Service: Ophthalmology;  Laterality: Left;   CATARACT EXTRACTION W/PHACO Right 05/18/2020   Procedure: CATARACT EXTRACTION PHACO AND INTRAOCULAR LENS PLACEMENT (IOC) RIGHT 15.56 01:52.6 13.8%;  Surgeon: Lockie Mola, MD;  Location: [facility];  Service: Ophthalmology;  Laterality: Right;   COLONOSCOPY  2008, 2014   CYSTOSCOPY WITH LITHOLAPAXY N/A 09/17/2019   Procedure: CYSTOSCOPY WITH LITHOLAPAXY;  Surgeon: Orson Ape, MD;  Location: [facility];  Service: Urology;  Laterality: N/A;   ESOPHAGOGASTRODUODENOSCOPY (EGD) WITH PROPOFOL N/A 10/13/2015   Procedure: ESOPHAGOGASTRODUODENOSCOPY (EGD) WITH PROPOFOL;  Surgeon: Christena Deem, MD;  Location: [facility];  Service: Endoscopy;  Laterality: N/A;    FASCIOTOMY Left 01/30/2019   Procedure: I&D BELOW FASCIA FOOT SINGLE LEFT;  Surgeon: Gwyneth Revels, DPM;  Location: [facility];  Service: Podiatry;  Laterality: Left;   GREEN LIGHT LASER TURP (TRANSURETHRAL RESECTION OF PROSTATE N/A 09/17/2019   Procedure: GREEN LIGHT LASER TURP (TRANSURETHRAL RESECTION OF PROSTATE;  Surgeon: Orson Ape, MD;  Location: [facility];  Service: Urology;  Laterality: N/A;   PACEMAKER PLACEMENT  2006   There are no problems to display for this patient.   PCP: Jerl Mina, MD  REFERRING PROVIDER: Lonell Face, MD   REFERRING DIAG: Primary Parkinsonism   RATIONALE FOR EVALUATION AND TREATMENT: Rehabilitation  THERAPY DIAG: Muscle weakness (generalized)  Unsteadiness on feet  ONSET DATE: 07/15/2022  FOLLOW-UP APPT SCHEDULED WITH REFERRING PROVIDER:  Didn't Address    SUBJECTIVE:  SUBJECTIVE STATEMENT:  Balance Impairment & Parkinson's  PERTINENT HISTORY:  Patient reports he's had a dozen falls within the last year; four occurred since his last visit to ED 07/15/2022. Reports spinning with bending over to perform laundry. He fell in his churches bathroom and hit his head. Patient had a laceration on his left ear which was repaired and his was discharged after further evaluation. Pt diagnosed with Parkinson's Disease. He goes to [employer]. He would like to be able to move without fear of falling & get out of bed without any help.    Pain: No Numbness/Tingling: Yes Numbness present BLE, below knee down to plantar aspect of the foot.  Focal Weakness: No Recent changes in overall health/medication: No Prior history of physical therapy for balance:  No Dominant hand: right  Imaging: Yes, Pt reports imaging negative for hematoma, hemorrhaging Red flags:  Negative for bowel/bladder changes, saddle paresthesia, personal history of cancer, h/o spinal tumors, h/o compression fx, h/o abdominal aneurysm, abdominal pain, chills/fever, night sweats, nausea, vomiting, unrelenting pain, first onset of insidious LBP <20 y/o  PRECAUTIONS: Fall  WEIGHT BEARING RESTRICTIONS: No FALLS: Has patient fallen in last 6 months? Yes. Number of falls 4 , Directional pattern for falls: Yes Backwards, turns and falls.  Living Environment Lives with: lives with their spouse Lives in: House/apartment Stairs: No Has following equipment at home: Single point cane and Environmental consultant - 2 wheeled  Prior level of function: Independent and Independent with household mobility with device  Occupational demands: Retired  Hobbies: TEFL teacher, [employer] 1x/wk  Patient Goals: He would like to be able to move without fear of falling & get out of bed without any help.   OBJECTIVE:   Patient Surveys  FOTO: 32, Predicted to improve 64 (Visit 19) ABC: Deferred  Cognition Patient is oriented to person, place, and time.  Recent memory is intact.  Remote memory is intact.  Attention span and concentration are intact.  Expressive speech is intact, pt speech is slightly muffled/hyphophonic.  Patient's fund of knowledge is within normal limits for educational level.   Gross Musculoskeletal Assessment Tremor: None Bulk: Normal Tone: Normal "Large mass" in posterior upper thoracic back; pt noted no pain to palpation. MD aware  Posture: Mildly kyphotic in thoracic spine  AROM AROM (Normal range in degrees) AROM   Lumbar   Flexion (65)   Extension (30)   Right lateral flexion (25)   Left lateral flexion (25)   Right rotation (30)   Left rotation (30)       Hip Right Left  Flexion (125)    Extension (15)    Abduction (40)    Adduction     Internal Rotation (45)    External Rotation (45)        Knee    Flexion (135)    Extension (0)        Ankle     Dorsiflexion (20)    Plantarflexion (50)    Inversion (35)    Eversion (15)    (* = pain; Blank rows = not tested)  LE MMT: MMT (out of 5) Right  Left   Hip flexion 4+ 4+  Hip extension    Hip abduction    Hip adduction    Hip internal rotation 4 4  Hip external rotation 4 4  Knee flexion 4- 4-  Knee extension 4- 4-  Ankle dorsiflexion 4- 4-  Ankle plantarflexion    Ankle inversion  Ankle eversion    (* = pain; Blank rows = not tested)  Sensation Grossly intact to light touch throughout bilateral LEs as determined by testing dermatomes L2-S2. Proprioception, stereognosis, and hot/cold testing deferred on this date.  Sensation is slightly more Right > Left L4-5, S1   Reflexes Deferred  Cranial Nerves Deferred  Coordination/Cerebellar Finger to Nose: Slight Deviation from finger, more impaired noted on left more than right  Heel to Shin: WNL Rapid alternating movements: WNL Finger Opposition: WNL Pronator Drift: Negative  Bed mobility: Supine to sit CGA, pt reported assistance from spouse to return to seated position  Transfers: Assistive device utilized: Environmental consultant - 2 wheeled  Sit to stand: Min A, MinA for power up phase only  Stand to sit: CGA Chair to chair: Deferred Floor: Deferred  Curb:  Level of Assistance: Deferred Assistive device utilized: Deferred Curb Comments: Deferred  Stairs: Deferred  Gait: Gait pattern: step to pattern, decreased arm swing- Right, decreased arm swing- Left, decreased step length- Right, decreased step length- Left, and decreased stride length Distance walked: 67m Assistive device utilized: Walker - 2 wheeled Level of assistance: Modified independence Comments: Patient presents with decreased step length bilaterally;  Functional Outcome Measures  Results Comments  BERG    DGI    FGA    TUG 21.9 seconds Required verbal cues, assistance for transfer    5TSTS    6 Minute Walk Test    10 Meter Gait Speed  Self-selected: 11.3s = 0.88 m/s; With front wheeled walker, slightly below full community ambulation speed  (Blank rows = not tested)  VESTIBULAR TESTING Modified Dix-Hallpike R: negative, L: Negative    TODAY'S TREATMENT    SUBJECTIVE: Patient reports feeling better than last session following BPPV testing and canal repositioning. No further questions or concerns.    PAIN: No resting pain   Neuromuscular Re-ed  NuStep Level 1-4 (10min:1min on/off) x 7 min for warm up, power, and interval history;  Staggered Sit to Stands with Single or No UE support 2 x 10;  Static Balance Exercises:    Feet Together Stance on Airex  2 x 30s Semi-Tandem Stance on Airex, alternating LE in front 2 x 30s;  TUG Practice x 3 trials;  Bending and Reach practicing 90 and 180 turns;  Crossover Stepping in // bars, alternating LE in front x 3 trials; Side Stepping over 6" hurdles in // bars x 3 trials; Forward stepping over 6" hurdles in // bars against resistance;   Not Performed: Airex Static Balance Progression 3 x 10s;   Airex Rhomberg 3 x 10s; Cross Stepping Left and Right  x multiple legnths; Cross Stepping Left and Right with Boxing Punches x multiple lengths Resisted Forwards and Backwards Walking in // Bars, unsteady with backwards walking;  Seated Long Arc Quads 2 x 10 5# Ankle Weight (AW); Standing Marches with 3# AW 2 x 10; Step Ups with 3# AW 2 x 10; Step Ups with Claps BLE 2 x 10s; Stepping on Airex 2 x 10;  Stepping Over 6" Hurdles in // bars with 3# AW, 4 x multiple lengths; Semi-Tandem Stance Level Ground 3 x 10s ; Semi-Tandem with Reaching task BLE in front 2 x multiple lengths; Sitting with Large Amplitude stomp and clap 2 x 10 BLE Static Stance, Level Surface with Head Turns (HT) 2 x 15s; Walking in // bars with HT 2 x multiple lengths;  Walking with Weaving through 6 cones x 4;  Walking with PT facilitated Arm Swing  in hallway ~417ft; Weaving Through 6 Cones with 6"  hurdles x 4;  Weaving through 6 Cones with 12" hurdles x 4;   PATIENT EDUCATION:  Education details: Plan of Care  Person educated: Patient and Spouse Education method: Explanation Education comprehension: verbalized understanding   HOME EXERCISE PROGRAM:  Access Code: [email or web address] URL: [email or web address] Date: 10/05/2022 Prepared by: Ria Comment  Exercises - Sit to Stand with Counter Support  - 1 x daily - 7 x weekly - 3 sets - 10 reps - Standing March with Counter Support  - 1 x daily - 7 x weekly - 3 sets - 10 reps - Standing Romberg to 1/2 Tandem Stance  - 1 x daily - 7 x weekly - 3 x 30s with each foot forward hold   ASSESSMENT:  CLINICAL IMPRESSION: Patient arrived to physical therapy highly motivated. Today's session with a main focus on balance and LE strength. Deferred BPPV testing today. Pt demonstrating improvements in dynamic balance today. Able to perform forward and retro gait against resistance without major LOB. Pt able to perform TUG practice and reaching for cones closer to ground level without freezing episode or LOB.  No changes in HEP today. Pt will continue to benefit from skilled physical therapy to address deficits in strength, balance, and mobility in order to return to full function at home.  OBJECTIVE IMPAIRMENTS: decreased activity tolerance, decreased balance, decreased coordination, decreased endurance, decreased mobility, difficulty walking, decreased ROM, decreased strength, and impaired sensation.   ACTIVITY LIMITATIONS: standing, squatting, stairs, transfers, and bed mobility  PARTICIPATION LIMITATIONS: laundry, driving, and community activity  PERSONAL FACTORS: Age, Fitness, Past/current experiences, and 3+ comorbidities: Atrial Fibrillation, CAD, Parkinson's,   are also affecting patient's functional outcome.   REHAB POTENTIAL: Good  CLINICAL DECISION MAKING: Evolving/moderate complexity  EVALUATION COMPLEXITY:  High   GOALS: Goals reviewed with patient? No  SHORT TERM GOALS: Target date:   Pt will be independent with HEP in order to improve strength and balance in order to decrease fall risk and improve function at home. Baseline:  Goal status: PROGRESSING   LONG TERM GOALS: Target date: 01/02/2023   Pt will increase FOTO to at least 64 to demonstrate significant improvement in function at home related to balance  Baseline: 08/08: 54 09/27/2022: 64 Goal status: ACHIEVED  2.  Pt will improve BERG by at least 3 points in order to demonstrate clinically significant improvement in balance.   Baseline: 08/08: 27/56; 09/27/22: 26/56 Goal status: ONGOING  3. Pt will decrease TUG to below 14 seconds/decrease in order to demonstrate decreased fall risk.  Baseline: 08/06: 21.9; 09/27/22: 8.85 Goal status: ACHIEVED  4. Pt will decrease 5TSTS by at least 3 seconds in order to demonstrate clinically significant improvement in LE strength Baseline: 08/08: 26s; 09/27/22: 16.43s Goal status: ACHIEVED  5. Pt will improve ABC by at least 13% in order to demonstrate clinically significant improvement in balance confidence. Baseline: 08/08: 63% 09/27/22: 61.8% Goal status: ONGOING  PLAN: PT FREQUENCY: 2x/week  PT DURATION: 8 weeks  PLANNED INTERVENTIONS: Therapeutic exercises, Therapeutic activity, Neuromuscular re-education, Balance training, Gait training, Patient/Family education, Self Care, Joint mobilization, Joint manipulation, Vestibular training, Canalith repositioning, Orthotic/Fit training, DME instructions, Dry Needling, Electrical stimulation, Spinal manipulation, Spinal mobilization, Cryotherapy, Moist heat, Taping, Traction, Ultrasound, Ionotophoresis 4mg /ml Dexamethasone, Manual therapy, and Re-evaluation.  PLAN FOR NEXT SESSION:  BPPV retesting, continue progressing strength and balance program. Review and modify HEP   Sharalyn Ink Huprich PT, DPT, GCS  Rickesha Veracruz, SPT

## 2022-10-11 ENCOUNTER — Ambulatory Visit: Payer: Medicare HMO

## 2022-10-11 DIAGNOSIS — R2681 Unsteadiness on feet: Secondary | ICD-10-CM | POA: Diagnosis not present

## 2022-10-11 DIAGNOSIS — M6281 Muscle weakness (generalized): Secondary | ICD-10-CM | POA: Diagnosis not present

## 2022-10-11 DIAGNOSIS — R2689 Other abnormalities of gait and mobility: Secondary | ICD-10-CM | POA: Diagnosis not present

## 2022-10-11 NOTE — Therapy (Addendum)
OUTPATIENT PHYSICAL THERAPY BALANCE TREATMENT   Patient Name: Daniel [name] MRN: [id number] DOB:1940-11-22, 82 y.o., male Today's Date: 10/11/2022  END OF SESSION:  PT End of Session - 10/11/22 1306     Visit Number 14    Number of Visits 25    Date for PT Re-Evaluation 11/20/22    Authorization Type eval: 08/28/22    PT Start Time 1300    PT Stop Time 1345    PT Time Calculation (min) 45 min    Equipment Utilized During Treatment Gait belt    Activity Tolerance Patient tolerated treatment well    Behavior During Therapy WFL for tasks assessed/performed            Past Medical History:  Diagnosis Date   AF (atrial fibrillation) (HCC)    B12 deficiency    Coronary artery disease    DH (dermatitis herpetiformis)    Dysphagia    Hypertension    Presence of permanent cardiac pacemaker    2006   Primary parkinsonism    Prostate cancer (HCC) 2014   Past Surgical History:  Procedure Laterality Date   APPENDECTOMY     CARDIAC CATHETERIZATION     CATARACT EXTRACTION W/PHACO Left 05/04/2020   Procedure: CATARACT EXTRACTION PHACO AND INTRAOCULAR LENS PLACEMENT (IOC) LEFT  6.76 00:59.9 11.3%;  Surgeon: Lockie Mola, MD;  Location: [facility];  Service: Ophthalmology;  Laterality: Left;   CATARACT EXTRACTION W/PHACO Right 05/18/2020   Procedure: CATARACT EXTRACTION PHACO AND INTRAOCULAR LENS PLACEMENT (IOC) RIGHT 15.56 01:52.6 13.8%;  Surgeon: Lockie Mola, MD;  Location: [facility];  Service: Ophthalmology;  Laterality: Right;   COLONOSCOPY  2008, 2014   CYSTOSCOPY WITH LITHOLAPAXY N/A 09/17/2019   Procedure: CYSTOSCOPY WITH LITHOLAPAXY;  Surgeon: Orson Ape, MD;  Location: [facility];  Service: Urology;  Laterality: N/A;   ESOPHAGOGASTRODUODENOSCOPY (EGD) WITH PROPOFOL N/A 10/13/2015   Procedure: ESOPHAGOGASTRODUODENOSCOPY (EGD) WITH PROPOFOL;  Surgeon: Christena Deem, MD;  Location: [facility];  Service: Endoscopy;  Laterality: N/A;    FASCIOTOMY Left 01/30/2019   Procedure: I&D BELOW FASCIA FOOT SINGLE LEFT;  Surgeon: Gwyneth Revels, DPM;  Location: [facility];  Service: Podiatry;  Laterality: Left;   GREEN LIGHT LASER TURP (TRANSURETHRAL RESECTION OF PROSTATE N/A 09/17/2019   Procedure: GREEN LIGHT LASER TURP (TRANSURETHRAL RESECTION OF PROSTATE;  Surgeon: Orson Ape, MD;  Location: [facility];  Service: Urology;  Laterality: N/A;   PACEMAKER PLACEMENT  2006   There are no problems to display for this patient.   PCP: Jerl Mina, MD  REFERRING PROVIDER: Lonell Face, MD   REFERRING DIAG: Primary Parkinsonism   RATIONALE FOR EVALUATION AND TREATMENT: Rehabilitation  THERAPY DIAG: Muscle weakness (generalized)  Unsteadiness on feet  ONSET DATE: 07/15/2022  FOLLOW-UP APPT SCHEDULED WITH REFERRING PROVIDER:  Didn't Address    SUBJECTIVE:  SUBJECTIVE STATEMENT:  Balance Impairment & Parkinson's  PERTINENT HISTORY:  Patient reports he's had a dozen falls within the last year; four occurred since his last visit to ED 07/15/2022. Reports spinning with bending over to perform laundry. He fell in his churches bathroom and hit his head. Patient had a laceration on his left ear which was repaired and his was discharged after further evaluation. Pt diagnosed with Parkinson's Disease. He goes to [employer]. He would like to be able to move without fear of falling & get out of bed without any help.    Pain: No Numbness/Tingling: Yes Numbness present BLE, below knee down to plantar aspect of the foot.  Focal Weakness: No Recent changes in overall health/medication: No Prior history of physical therapy for balance:  No Dominant hand: right  Imaging: Yes, Pt reports imaging negative for hematoma, hemorrhaging Red flags:  Negative for bowel/bladder changes, saddle paresthesia, personal history of cancer, h/o spinal tumors, h/o compression fx, h/o abdominal aneurysm, abdominal pain, chills/fever, night sweats, nausea, vomiting, unrelenting pain, first onset of insidious LBP <20 y/o  PRECAUTIONS: Fall  WEIGHT BEARING RESTRICTIONS: No FALLS: Has patient fallen in last 6 months? Yes. Number of falls 4 , Directional pattern for falls: Yes Backwards, turns and falls.  Living Environment Lives with: lives with their spouse Lives in: House/apartment Stairs: No Has following equipment at home: Single point cane and Environmental consultant - 2 wheeled  Prior level of function: Independent and Independent with household mobility with device  Occupational demands: Retired  Hobbies: TEFL teacher, [employer] 1x/wk  Patient Goals: He would like to be able to move without fear of falling & get out of bed without any help.   OBJECTIVE:   Patient Surveys  FOTO: 93, Predicted to improve 64 (Visit 19) ABC: Deferred  Cognition Patient is oriented to person, place, and time.  Recent memory is intact.  Remote memory is intact.  Attention span and concentration are intact.  Expressive speech is intact, pt speech is slightly muffled/hyphophonic.  Patient's fund of knowledge is within normal limits for educational level.   Gross Musculoskeletal Assessment Tremor: None Bulk: Normal Tone: Normal "Large mass" in posterior upper thoracic back; pt noted no pain to palpation. MD aware  Posture: Mildly kyphotic in thoracic spine  AROM AROM (Normal range in degrees) AROM   Lumbar   Flexion (65)   Extension (30)   Right lateral flexion (25)   Left lateral flexion (25)   Right rotation (30)   Left rotation (30)       Hip Right Left  Flexion (125)    Extension (15)    Abduction (40)    Adduction     Internal Rotation (45)    External Rotation (45)        Knee    Flexion (135)    Extension (0)        Ankle     Dorsiflexion (20)    Plantarflexion (50)    Inversion (35)    Eversion (15)    (* = pain; Blank rows = not tested)  LE MMT: MMT (out of 5) Right  Left   Hip flexion 4+ 4+  Hip extension    Hip abduction    Hip adduction    Hip internal rotation 4 4  Hip external rotation 4 4  Knee flexion 4- 4-  Knee extension 4- 4-  Ankle dorsiflexion 4- 4-  Ankle plantarflexion    Ankle inversion  Ankle eversion    (* = pain; Blank rows = not tested)  Sensation Grossly intact to light touch throughout bilateral LEs as determined by testing dermatomes L2-S2. Proprioception, stereognosis, and hot/cold testing deferred on this date.  Sensation is slightly more Right > Left L4-5, S1   Reflexes Deferred  Cranial Nerves Deferred  Coordination/Cerebellar Finger to Nose: Slight Deviation from finger, more impaired noted on left more than right  Heel to Shin: WNL Rapid alternating movements: WNL Finger Opposition: WNL Pronator Drift: Negative  Bed mobility: Supine to sit CGA, pt reported assistance from spouse to return to seated position  Transfers: Assistive device utilized: Environmental consultant - 2 wheeled  Sit to stand: Min A, MinA for power up phase only  Stand to sit: CGA Chair to chair: Deferred Floor: Deferred  Curb:  Level of Assistance: Deferred Assistive device utilized: Deferred Curb Comments: Deferred  Stairs: Deferred  Gait: Gait pattern: step to pattern, decreased arm swing- Right, decreased arm swing- Left, decreased step length- Right, decreased step length- Left, and decreased stride length Distance walked: 66m Assistive device utilized: Walker - 2 wheeled Level of assistance: Modified independence Comments: Patient presents with decreased step length bilaterally;  Functional Outcome Measures  Results Comments  BERG    DGI    FGA    TUG 21.9 seconds Required verbal cues, assistance for transfer    5TSTS    6 Minute Walk Test    10 Meter Gait Speed  Self-selected: 11.3s = 0.88 m/s; With front wheeled walker, slightly below full community ambulation speed  (Blank rows = not tested)  VESTIBULAR TESTING Modified Dix-Hallpike R: negative, L: Negative    TODAY'S TREATMENT   SUBJECTIVE: Patient reports feeling mild unsteadiness but has continued to progress HEP at home. Pt reports that's he is improving with semi tandem stance.  No further questions or concerns.    PAIN: No resting pain   Neuromuscular Re-ed  Sit to Stand with Open Arms & Overhead Clapping 2 x 10;  Marching with arm swings in // bars x 4 trials; Static Balance Progression: Airex feet Parallel 2 x 30s;  Airex feet Together 2 x 30s;  Semi-Tandem with Reaching Task (high fives)1 x 60s; Boxing Punches incorporating various crossing techniques at different heights x 2 min;    Canalith Repositioning Treatment Dix Hallpike positive on the R for mild vertigo and upbeat R torsional nystagmus lasting 8-10s.   Pt treated with 2 bouts of Epley Maneuver for presumed R posterior canal BPPV. One minute holds in each position and retesting between maneuvers. After first maneuver pt continues to present with minor upbeating R torsional nystagmus mild vertigo but severity is decreased. After second Epley Maneuver performed Roll Test and in L Roll Test position pt presents with pure horizontal ageotrophic nystagmus with mild vertigo lasting >1 minute. No nystagmus noted in R Roll Test position and no vertigo. Performed R Sidelying Test which is negative for both vertigo and nsytagmus. Proceeded to perform 1 Liberatory Maneuver for R posterior canal with 1 minute holds in each position;   Not Performed: Airex Static Balance Progression 3 x 10s;   Airex Rhomberg 3 x 10s; Cross Stepping Left and Right  x multiple legnths; Cross Stepping Left and Right with Boxing Punches x multiple lengths Resisted Forwards and Backwards Walking in // Bars, unsteady with backwards walking;  Seated Long  Arc Quads 2 x 10 5# Ankle Weight (AW); Standing Marches with 3# AW 2 x 10; Step Ups with 3# AW  2 x 10; Step Ups with Claps BLE 2 x 10s; Stepping on Airex 2 x 10;  Stepping Over 6" Hurdles in // bars with 3# AW, 4 x multiple lengths; Semi-Tandem Stance Level Ground 3 x 10s ; Semi-Tandem with Reaching task BLE in front 2 x multiple lengths; Sitting with Large Amplitude stomp and clap 2 x 10 BLE Static Stance, Level Surface with Head Turns (HT) 2 x 15s; Walking in // bars with HT 2 x multiple lengths;  Walking with Weaving through 6 cones x 4;  Walking with PT facilitated Arm Swing in hallway ~442ft; Weaving Through 6 Cones with 6" hurdles x 4;  Weaving through 6 Cones with 12" hurdles x 4;   PATIENT EDUCATION:  Education details: Plan of Care  Person educated: Patient and Spouse Education method: Explanation Education comprehension: verbalized understanding   HOME EXERCISE PROGRAM:  Access Code: [email or web address] URL: [email or web address] Date: 10/05/2022 Prepared by: Ria Comment  Exercises - Sit to Stand with Counter Support  - 1 x daily - 7 x weekly - 3 sets - 10 reps - Standing March with Counter Support  - 1 x daily - 7 x weekly - 3 sets - 10 reps - Standing Romberg to 1/2 Tandem Stance  - 1 x daily - 7 x weekly - 3 x 30s with each foot forward hold   ASSESSMENT:  CLINICAL IMPRESSION: Patient arrived to physical therapy highly motivated. Today's session with a main focus on balance. Positive BPPV testing today so performed canalith repositioning treatment with reported improvement. Additional time during session with a focus on progressing static and dynamic balance. No changes in HEP today. Pt will continue to benefit from skilled physical therapy to address deficits in strength, balance, and mobility in order to return to full function at home.  OBJECTIVE IMPAIRMENTS: decreased activity tolerance, decreased balance, decreased coordination, decreased endurance,  decreased mobility, difficulty walking, decreased ROM, decreased strength, and impaired sensation.   ACTIVITY LIMITATIONS: standing, squatting, stairs, transfers, and bed mobility  PARTICIPATION LIMITATIONS: laundry, driving, and community activity  PERSONAL FACTORS: Age, Fitness, Past/current experiences, and 3+ comorbidities: Atrial Fibrillation, CAD, Parkinson's,   are also affecting patient's functional outcome.   REHAB POTENTIAL: Good  CLINICAL DECISION MAKING: Evolving/moderate complexity  EVALUATION COMPLEXITY: High   GOALS: Goals reviewed with patient? No  SHORT TERM GOALS: Target date:   Pt will be independent with HEP in order to improve strength and balance in order to decrease fall risk and improve function at home. Baseline:  Goal status: PROGRESSING   LONG TERM GOALS: Target date: 01/03/2023   Pt will increase FOTO to at least 64 to demonstrate significant improvement in function at home related to balance  Baseline: 08/08: 54 09/27/2022: 64 Goal status: ACHIEVED  2.  Pt will improve BERG by at least 3 points in order to demonstrate clinically significant improvement in balance.   Baseline: 08/08: 27/56; 09/27/22: 26/56 Goal status: ONGOING  3. Pt will decrease TUG to below 14 seconds/decrease in order to demonstrate decreased fall risk.  Baseline: 08/06: 21.9; 09/27/22: 8.85 Goal status: ACHIEVED  4. Pt will decrease 5TSTS by at least 3 seconds in order to demonstrate clinically significant improvement in LE strength Baseline: 08/08: 26s; 09/27/22: 16.43s Goal status: ACHIEVED  5. Pt will improve ABC by at least 13% in order to demonstrate clinically significant improvement in balance confidence. Baseline: 08/08: 63% 09/27/22: 61.8% Goal status: ONGOING  PLAN: PT FREQUENCY: 2x/week  PT DURATION: 8 weeks  PLANNED INTERVENTIONS: Therapeutic exercises,  Therapeutic activity, Neuromuscular re-education, Balance training, Gait training, Patient/Family  education, Self Care, Joint mobilization, Joint manipulation, Vestibular training, Canalith repositioning, Orthotic/Fit training, DME instructions, Dry Needling, Electrical stimulation, Spinal manipulation, Spinal mobilization, Cryotherapy, Moist heat, Taping, Traction, Ultrasound, Ionotophoresis 4mg /ml Dexamethasone, Manual therapy, and Re-evaluation.  PLAN FOR NEXT SESSION:  BPPV retesting, continue progressing strength and balance program. Review and modify HEP   Sharalyn Ink Huprich PT, DPT, GCS  Carinna Newhart, SPT

## 2022-10-16 ENCOUNTER — Ambulatory Visit: Payer: Medicare HMO

## 2022-10-16 DIAGNOSIS — R2689 Other abnormalities of gait and mobility: Secondary | ICD-10-CM | POA: Diagnosis not present

## 2022-10-16 DIAGNOSIS — R2681 Unsteadiness on feet: Secondary | ICD-10-CM

## 2022-10-16 DIAGNOSIS — M6281 Muscle weakness (generalized): Secondary | ICD-10-CM | POA: Diagnosis not present

## 2022-10-16 NOTE — Therapy (Cosign Needed Addendum)
OUTPATIENT PHYSICAL THERAPY BALANCE TREATMENT   Patient Name: Daniel [name] MRN: [id number] DOB:09-17-1940, 82 y.o., male Today's Date: 10/17/2022  END OF SESSION:  PT End of Session - 10/16/22 1110     Visit Number 15    Number of Visits 25    Date for PT Re-Evaluation 11/20/22    Authorization Type eval: 08/28/22    PT Start Time 1105    PT Stop Time 1145    PT Time Calculation (min) 40 min    Equipment Utilized During Treatment Gait belt    Activity Tolerance Patient tolerated treatment well    Behavior During Therapy WFL for tasks assessed/performed            Past Medical History:  Diagnosis Date   AF (atrial fibrillation) (HCC)    B12 deficiency    Coronary artery disease    DH (dermatitis herpetiformis)    Dysphagia    Hypertension    Presence of permanent cardiac pacemaker    2006   Primary parkinsonism    Prostate cancer (HCC) 2014   Past Surgical History:  Procedure Laterality Date   APPENDECTOMY     CARDIAC CATHETERIZATION     CATARACT EXTRACTION W/PHACO Left 05/04/2020   Procedure: CATARACT EXTRACTION PHACO AND INTRAOCULAR LENS PLACEMENT (IOC) LEFT  6.76 00:59.9 11.3%;  Surgeon: Lockie Mola, MD;  Location: [facility];  Service: Ophthalmology;  Laterality: Left;   CATARACT EXTRACTION W/PHACO Right 05/18/2020   Procedure: CATARACT EXTRACTION PHACO AND INTRAOCULAR LENS PLACEMENT (IOC) RIGHT 15.56 01:52.6 13.8%;  Surgeon: Lockie Mola, MD;  Location: [facility];  Service: Ophthalmology;  Laterality: Right;   COLONOSCOPY  2008, 2014   CYSTOSCOPY WITH LITHOLAPAXY N/A 09/17/2019   Procedure: CYSTOSCOPY WITH LITHOLAPAXY;  Surgeon: Orson Ape, MD;  Location: [facility];  Service: Urology;  Laterality: N/A;   ESOPHAGOGASTRODUODENOSCOPY (EGD) WITH PROPOFOL N/A 10/13/2015   Procedure: ESOPHAGOGASTRODUODENOSCOPY (EGD) WITH PROPOFOL;  Surgeon: Christena Deem, MD;  Location: [facility];  Service: Endoscopy;  Laterality: N/A;    FASCIOTOMY Left 01/30/2019   Procedure: I&D BELOW FASCIA FOOT SINGLE LEFT;  Surgeon: Gwyneth Revels, DPM;  Location: [facility];  Service: Podiatry;  Laterality: Left;   GREEN LIGHT LASER TURP (TRANSURETHRAL RESECTION OF PROSTATE N/A 09/17/2019   Procedure: GREEN LIGHT LASER TURP (TRANSURETHRAL RESECTION OF PROSTATE;  Surgeon: Orson Ape, MD;  Location: [facility];  Service: Urology;  Laterality: N/A;   PACEMAKER PLACEMENT  2006   There are no problems to display for this patient.   PCP: Jerl Mina, MD  REFERRING PROVIDER: Lonell Face, MD   REFERRING DIAG: Primary Parkinsonism   RATIONALE FOR EVALUATION AND TREATMENT: Rehabilitation  THERAPY DIAG: Muscle weakness (generalized)  Unsteadiness on feet  ONSET DATE: 07/15/2022  FOLLOW-UP APPT SCHEDULED WITH REFERRING PROVIDER:  Didn't Address    SUBJECTIVE:  SUBJECTIVE STATEMENT:  Balance Impairment & Parkinson's  PERTINENT HISTORY:  Patient reports he's had a dozen falls within the last year; four occurred since his last visit to ED 07/15/2022. Reports spinning with bending over to perform laundry. He fell in his churches bathroom and hit his head. Patient had a laceration on his left ear which was repaired and his was discharged after further evaluation. Pt diagnosed with Parkinson's Disease. He goes to [employer]. He would like to be able to move without fear of falling & get out of bed without any help.    Pain: No Numbness/Tingling: Yes Numbness present BLE, below knee down to plantar aspect of the foot.  Focal Weakness: No Recent changes in overall health/medication: No Prior history of physical therapy for balance:  No Dominant hand: right  Imaging: Yes, Pt reports imaging negative for hematoma, hemorrhaging Red flags:  Negative for bowel/bladder changes, saddle paresthesia, personal history of cancer, h/o spinal tumors, h/o compression fx, h/o abdominal aneurysm, abdominal pain, chills/fever, night sweats, nausea, vomiting, unrelenting pain, first onset of insidious LBP <20 y/o  PRECAUTIONS: Fall  WEIGHT BEARING RESTRICTIONS: No FALLS: Has patient fallen in last 6 months? Yes. Number of falls 4 , Directional pattern for falls: Yes Backwards, turns and falls.  Living Environment Lives with: lives with their spouse Lives in: House/apartment Stairs: No Has following equipment at home: Single point cane and Environmental consultant - 2 wheeled  Prior level of function: Independent and Independent with household mobility with device  Occupational demands: Retired  Hobbies: TEFL teacher, [employer] 1x/wk  Patient Goals: He would like to be able to move without fear of falling & get out of bed without any help.   OBJECTIVE:   Patient Surveys  FOTO: 58, Predicted to improve 64 (Visit 19) ABC: Deferred  Cognition Patient is oriented to person, place, and time.  Recent memory is intact.  Remote memory is intact.  Attention span and concentration are intact.  Expressive speech is intact, pt speech is slightly muffled/hyphophonic.  Patient's fund of knowledge is within normal limits for educational level.   Gross Musculoskeletal Assessment Tremor: None Bulk: Normal Tone: Normal "Large mass" in posterior upper thoracic back; pt noted no pain to palpation. MD aware  Posture: Mildly kyphotic in thoracic spine  AROM AROM (Normal range in degrees) AROM   Lumbar   Flexion (65)   Extension (30)   Right lateral flexion (25)   Left lateral flexion (25)   Right rotation (30)   Left rotation (30)       Hip Right Left  Flexion (125)    Extension (15)    Abduction (40)    Adduction     Internal Rotation (45)    External Rotation (45)        Knee    Flexion (135)    Extension (0)        Ankle     Dorsiflexion (20)    Plantarflexion (50)    Inversion (35)    Eversion (15)    (* = pain; Blank rows = not tested)  LE MMT: MMT (out of 5) Right  Left   Hip flexion 4+ 4+  Hip extension    Hip abduction    Hip adduction    Hip internal rotation 4 4  Hip external rotation 4 4  Knee flexion 4- 4-  Knee extension 4- 4-  Ankle dorsiflexion 4- 4-  Ankle plantarflexion    Ankle inversion  Ankle eversion    (* = pain; Blank rows = not tested)  Sensation Grossly intact to light touch throughout bilateral LEs as determined by testing dermatomes L2-S2. Proprioception, stereognosis, and hot/cold testing deferred on this date.  Sensation is slightly more Right > Left L4-5, S1   Reflexes Deferred  Cranial Nerves Deferred  Coordination/Cerebellar Finger to Nose: Slight Deviation from finger, more impaired noted on left more than right  Heel to Shin: WNL Rapid alternating movements: WNL Finger Opposition: WNL Pronator Drift: Negative  Bed mobility: Supine to sit CGA, pt reported assistance from spouse to return to seated position  Transfers: Assistive device utilized: Environmental consultant - 2 wheeled  Sit to stand: Min A, MinA for power up phase only  Stand to sit: CGA Chair to chair: Deferred Floor: Deferred  Curb:  Level of Assistance: Deferred Assistive device utilized: Deferred Curb Comments: Deferred  Stairs: Deferred  Gait: Gait pattern: step to pattern, decreased arm swing- Right, decreased arm swing- Left, decreased step length- Right, decreased step length- Left, and decreased stride length Distance walked: 46m Assistive device utilized: Walker - 2 wheeled Level of assistance: Modified independence Comments: Patient presents with decreased step length bilaterally;  Functional Outcome Measures  Results Comments  BERG    DGI    FGA    TUG 21.9 seconds Required verbal cues, assistance for transfer    5TSTS    6 Minute Walk Test    10 Meter Gait Speed  Self-selected: 11.3s = 0.88 m/s; With front wheeled walker, slightly below full community ambulation speed  (Blank rows = not tested)  VESTIBULAR TESTING Modified Dix-Hallpike R: negative, L: Negative    TODAY'S TREATMENT   SUBJECTIVE: Patient with improvements in dizziness when transferring from bed. Stomach slightly upset today but highly motivated for physical therapy. No further questions or concerns.    PAIN: No resting pain   Neuromuscular Re-ed  NuStep Level 1-3 x 10 min for warm up and interval history;  Functional Outcome Measures:  5TSTS w/ UE support: 10.70 TUG: Trial 1: 9.18, Trial 2: 8.67  Sit to Stand with 4# medicine ball 1 x 10;  Walking with PT facilitated Arm Swing in hallway ~210 ft; Stepping over 6" hurdles x 3 in // bars; Semi-Tandem Stance in // bars 1 x 30s;  Part Practice on 90 turning with various tasks: tapping on a 6" step, stepping on and off airex pad, picking up/off cone x 3 trials;  6" step taps at stairs with BUE support;  12" step taps at stairs with BUE support (more difficult);   Not Performed: Airex Static Balance Progression 3 x 10s;   Airex Rhomberg 3 x 10s; Cross Stepping Left and Right  x multiple legnths; Cross Stepping Left and Right with Boxing Punches x multiple lengths Resisted Forwards and Backwards Walking in // Bars, unsteady with backwards walking;  Seated Long Arc Quads 2 x 10 5# Ankle Weight (AW); Standing Marches with 3# AW 2 x 10; Step Ups with 3# AW 2 x 10; Step Ups with Claps BLE 2 x 10s; Stepping on Airex 2 x 10;  Stepping Over 6" Hurdles in // bars with 3# AW, 4 x multiple lengths; Semi-Tandem Stance Level Ground 3 x 10s ; Semi-Tandem with Reaching task BLE in front 2 x multiple lengths; Sitting with Large Amplitude stomp and clap 2 x 10 BLE Static Stance, Level Surface with Head Turns (HT) 2 x 15s; Walking in // bars with HT 2 x multiple lengths;  Walking  with Weaving through 6 cones x 4;  Walking with PT  facilitated Arm Swing in hallway ~443ft; Weaving Through 6 Cones with 6" hurdles x 4;  Weaving through 6 Cones with 12" hurdles x 4;   PATIENT EDUCATION:  Education details: Plan of Care  Person educated: Patient and Spouse Education method: Explanation Education comprehension: verbalized understanding   HOME EXERCISE PROGRAM:  Access Code: [email or web address] URL: [email or web address] Date: 10/05/2022 Prepared by: Ria Comment  Exercises - Sit to Stand with Counter Support  - 1 x daily - 7 x weekly - 3 sets - 10 reps - Standing March with Counter Support  - 1 x daily - 7 x weekly - 3 sets - 10 reps - Standing Romberg to 1/2 Tandem Stance  - 1 x daily - 7 x weekly - 3 x 30s with each foot forward hold   ASSESSMENT:  CLINICAL IMPRESSION: Patient arrived to physical therapy highly motivated. PT discussed plan of care with patient; pt endorsed 80% improvement since initial visit. He personally endorsed discharge from physical therapy in the next appointment. He has demonstrated improved LE strength indicated by improved times with TUG and 5TSTS (8.67s and 10.70s respectively). Patient still presents with posterior sway with retro stepping; tactile and verbal cue needed in order to improve shuffling pattern. Patient demonstrated improvement with turning; able to perform 90 turns without LOB in today's session.  Pt will continue to benefit from skilled physical therapy to address deficits in strength, balance, and mobility in order to return to full function at home.  OBJECTIVE IMPAIRMENTS: decreased activity tolerance, decreased balance, decreased coordination, decreased endurance, decreased mobility, difficulty walking, decreased ROM, decreased strength, and impaired sensation.   ACTIVITY LIMITATIONS: standing, squatting, stairs, transfers, and bed mobility  PARTICIPATION LIMITATIONS: laundry, driving, and community activity  PERSONAL FACTORS: Age, Fitness, Past/current  experiences, and 3+ comorbidities: Atrial Fibrillation, CAD, Parkinson's,   are also affecting patient's functional outcome.   REHAB POTENTIAL: Good  CLINICAL DECISION MAKING: Evolving/moderate complexity  EVALUATION COMPLEXITY: High   GOALS: Goals reviewed with patient? No  SHORT TERM GOALS: Target date:   Pt will be independent with HEP in order to improve strength and balance in order to decrease fall risk and improve function at home. Baseline:  Goal status: PROGRESSING   LONG TERM GOALS: Target date: 01/09/2023   Pt will increase FOTO to at least 64 to demonstrate significant improvement in function at home related to balance  Baseline: 08/08: 54 09/27/2022: 64 Goal status: ACHIEVED  2.  Pt will improve BERG by at least 3 points in order to demonstrate clinically significant improvement in balance.   Baseline: 08/08: 27/56; 09/27/22: 26/56 Goal status: ONGOING  3. Pt will decrease TUG to below 14 seconds/decrease in order to demonstrate decreased fall risk.  Baseline: 08/06: 21.9; 09/27/22: 8.85, 10/16/22: 8.67 Goal status: ACHIEVED  4. Pt will decrease 5TSTS by at least 3 seconds in order to demonstrate clinically significant improvement in LE strength Baseline: 08/08: 26s; 09/27/22: 16.43s, 10/16/2022: 10.70 Goal status: ACHIEVED  5. Pt will improve ABC by at least 13% in order to demonstrate clinically significant improvement in balance confidence. Baseline: 08/08: 63% 09/27/22: 61.8% Goal status: ONGOING  PLAN: PT FREQUENCY: 2x/week  PT DURATION: 8 weeks  PLANNED INTERVENTIONS: Therapeutic exercises, Therapeutic activity, Neuromuscular re-education, Balance training, Gait training, Patient/Family education, Self Care, Joint mobilization, Joint manipulation, Vestibular training, Canalith repositioning, Orthotic/Fit training, DME instructions, Dry Needling, Electrical stimulation, Spinal manipulation, Spinal mobilization, Cryotherapy, Moist heat, Taping, Traction,  Ultrasound,  Ionotophoresis 4mg /ml Dexamethasone, Manual therapy, and Re-evaluation.  PLAN FOR NEXT SESSION:  update additional outcome measures, discharge, BPPV retesting, continue progressing strength and balance program. Review and modify HEP   Sharalyn Ink Huprich PT, DPT, GCS  Estiven Kohan, SPT

## 2022-10-18 ENCOUNTER — Ambulatory Visit: Payer: Medicare HMO

## 2022-10-18 DIAGNOSIS — M6281 Muscle weakness (generalized): Secondary | ICD-10-CM

## 2022-10-18 DIAGNOSIS — R2681 Unsteadiness on feet: Secondary | ICD-10-CM | POA: Diagnosis not present

## 2022-10-18 DIAGNOSIS — R2689 Other abnormalities of gait and mobility: Secondary | ICD-10-CM | POA: Diagnosis not present

## 2022-10-18 NOTE — Therapy (Addendum)
OUTPATIENT PHYSICAL THERAPY BALANCE TREATMENT   Patient Name: Daniel [name] MRN: [id number] DOB:1940-07-06, 82 y.o., male Today's Date: 10/18/2022  END OF SESSION:  PT End of Session - 10/18/22 1100     Visit Number 16    Number of Visits 25    Date for PT Re-Evaluation 11/20/22    Authorization Type eval: 08/28/22    PT Start Time 1100    PT Stop Time 1145    PT Time Calculation (min) 45 min    Equipment Utilized During Treatment Gait belt    Activity Tolerance Patient tolerated treatment well    Behavior During Therapy WFL for tasks assessed/performed            Past Medical History:  Diagnosis Date   AF (atrial fibrillation) (HCC)    B12 deficiency    Coronary artery disease    DH (dermatitis herpetiformis)    Dysphagia    Hypertension    Presence of permanent cardiac pacemaker    2006   Primary parkinsonism    Prostate cancer (HCC) 2014   Past Surgical History:  Procedure Laterality Date   APPENDECTOMY     CARDIAC CATHETERIZATION     CATARACT EXTRACTION W/PHACO Left 05/04/2020   Procedure: CATARACT EXTRACTION PHACO AND INTRAOCULAR LENS PLACEMENT (IOC) LEFT  6.76 00:59.9 11.3%;  Surgeon: Lockie Mola, MD;  Location: [facility];  Service: Ophthalmology;  Laterality: Left;   CATARACT EXTRACTION W/PHACO Right 05/18/2020   Procedure: CATARACT EXTRACTION PHACO AND INTRAOCULAR LENS PLACEMENT (IOC) RIGHT 15.56 01:52.6 13.8%;  Surgeon: Lockie Mola, MD;  Location: [facility];  Service: Ophthalmology;  Laterality: Right;   COLONOSCOPY  2008, 2014   CYSTOSCOPY WITH LITHOLAPAXY N/A 09/17/2019   Procedure: CYSTOSCOPY WITH LITHOLAPAXY;  Surgeon: Orson Ape, MD;  Location: [facility];  Service: Urology;  Laterality: N/A;   ESOPHAGOGASTRODUODENOSCOPY (EGD) WITH PROPOFOL N/A 10/13/2015   Procedure: ESOPHAGOGASTRODUODENOSCOPY (EGD) WITH PROPOFOL;  Surgeon: Christena Deem, MD;  Location: [facility];  Service: Endoscopy;  Laterality: N/A;    FASCIOTOMY Left 01/30/2019   Procedure: I&D BELOW FASCIA FOOT SINGLE LEFT;  Surgeon: Gwyneth Revels, DPM;  Location: [facility];  Service: Podiatry;  Laterality: Left;   GREEN LIGHT LASER TURP (TRANSURETHRAL RESECTION OF PROSTATE N/A 09/17/2019   Procedure: GREEN LIGHT LASER TURP (TRANSURETHRAL RESECTION OF PROSTATE;  Surgeon: Orson Ape, MD;  Location: [facility];  Service: Urology;  Laterality: N/A;   PACEMAKER PLACEMENT  2006   There are no problems to display for this patient.   PCP: Jerl Mina, MD  REFERRING PROVIDER: Lonell Face, MD   REFERRING DIAG: Primary Parkinsonism   RATIONALE FOR EVALUATION AND TREATMENT: Rehabilitation  THERAPY DIAG: No diagnosis found.  ONSET DATE: 07/15/2022  FOLLOW-UP APPT SCHEDULED WITH REFERRING PROVIDER:  Didn't Address    SUBJECTIVE:  SUBJECTIVE STATEMENT:  Balance Impairment & Parkinson's  PERTINENT HISTORY:  Patient reports he's had a dozen falls within the last year; four occurred since his last visit to ED 07/15/2022. Reports spinning with bending over to perform laundry. He fell in his churches bathroom and hit his head. Patient had a laceration on his left ear which was repaired and his was discharged after further evaluation. Pt diagnosed with Parkinson's Disease. He goes to [employer]. He would like to be able to move without fear of falling & get out of bed without any help.    Pain: No Numbness/Tingling: Yes Numbness present BLE, below knee down to plantar aspect of the foot.  Focal Weakness: No Recent changes in overall health/medication: No Prior history of physical therapy for balance:  No Dominant hand: right  Imaging: Yes, Pt reports imaging negative for hematoma, hemorrhaging Red flags: Negative for bowel/bladder changes,  saddle paresthesia, personal history of cancer, h/o spinal tumors, h/o compression fx, h/o abdominal aneurysm, abdominal pain, chills/fever, night sweats, nausea, vomiting, unrelenting pain, first onset of insidious LBP <20 y/o  PRECAUTIONS: Fall  WEIGHT BEARING RESTRICTIONS: No FALLS: Has patient fallen in last 6 months? Yes. Number of falls 4 , Directional pattern for falls: Yes Backwards, turns and falls.  Living Environment Lives with: lives with their spouse Lives in: House/apartment Stairs: No Has following equipment at home: Single point cane and Environmental consultant - 2 wheeled  Prior level of function: Independent and Independent with household mobility with device  Occupational demands: Retired  Hobbies: TEFL teacher, [employer] 1x/wk  Patient Goals: He would like to be able to move without fear of falling & get out of bed without any help.   OBJECTIVE:   Patient Surveys  FOTO: 74, Predicted to improve 64 (Visit 19) ABC: Deferred  Cognition Patient is oriented to person, place, and time.  Recent memory is intact.  Remote memory is intact.  Attention span and concentration are intact.  Expressive speech is intact, pt speech is slightly muffled/hyphophonic.  Patient's fund of knowledge is within normal limits for educational level.   Gross Musculoskeletal Assessment Tremor: None Bulk: Normal Tone: Normal "Large mass" in posterior upper thoracic back; pt noted no pain to palpation. MD aware  Posture: Mildly kyphotic in thoracic spine  AROM AROM (Normal range in degrees) AROM   Lumbar   Flexion (65)   Extension (30)   Right lateral flexion (25)   Left lateral flexion (25)   Right rotation (30)   Left rotation (30)       Hip Right Left  Flexion (125)    Extension (15)    Abduction (40)    Adduction     Internal Rotation (45)    External Rotation (45)        Knee    Flexion (135)    Extension (0)        Ankle    Dorsiflexion (20)    Plantarflexion  (50)    Inversion (35)    Eversion (15)    (* = pain; Blank rows = not tested)  LE MMT: MMT (out of 5) Right  Left   Hip flexion 4+ 4+  Hip extension    Hip abduction    Hip adduction    Hip internal rotation 4 4  Hip external rotation 4 4  Knee flexion 4- 4-  Knee extension 4- 4-  Ankle dorsiflexion 4- 4-  Ankle plantarflexion    Ankle inversion  Ankle eversion    (* = pain; Blank rows = not tested)  Sensation Grossly intact to light touch throughout bilateral LEs as determined by testing dermatomes L2-S2. Proprioception, stereognosis, and hot/cold testing deferred on this date.  Sensation is slightly more Right > Left L4-5, S1   Reflexes Deferred  Cranial Nerves Deferred  Coordination/Cerebellar Finger to Nose: Slight Deviation from finger, more impaired noted on left more than right  Heel to Shin: WNL Rapid alternating movements: WNL Finger Opposition: WNL Pronator Drift: Negative  Bed mobility: Supine to sit CGA, pt reported assistance from spouse to return to seated position  Transfers: Assistive device utilized: Environmental consultant - 2 wheeled  Sit to stand: Min A, MinA for power up phase only  Stand to sit: CGA Chair to chair: Deferred Floor: Deferred  Curb:  Level of Assistance: Deferred Assistive device utilized: Deferred Curb Comments: Deferred  Stairs: Deferred  Gait: Gait pattern: step to pattern, decreased arm swing- Right, decreased arm swing- Left, decreased step length- Right, decreased step length- Left, and decreased stride length Distance walked: 21m Assistive device utilized: Walker - 2 wheeled Level of assistance: Modified independence Comments: Patient presents with decreased step length bilaterally;  Functional Outcome Measures  Results Comments  BERG    DGI    FGA    TUG 21.9 seconds Required verbal cues, assistance for transfer    5TSTS    6 Minute Walk Test    10 Meter Gait Speed Self-selected: 11.3s = 0.88 m/s; With front  wheeled walker, slightly below full community ambulation speed  (Blank rows = not tested)  VESTIBULAR TESTING Modified Dix-Hallpike R: negative, L: Negative    TODAY'S TREATMENT   SUBJECTIVE: Patient arrived to physical therapy with a report of a fall yesterday. Patient reported "After sitting on the bed for a couple minutes, I stood up and turned to my right and fell onto the floor." He reports brief weakness and was able to stand with assistance. He denied head trauma, dizziness or vertigo.    PAIN: No resting pain   Neuromuscular Re-ed  NuStep Level 1-3 x 10 min for warm up and interval history;  6" Step up with "Stomp" 1 x 10 ea side;  Walking Around Clinic with Arm Swings and Large Turns ~200 ft; Stepping over 6" Hurdles with Stepping on/off airex;  Part Practice with Wide Turns x 6 BLE; Feet Together in parallel bars with cone stacking x multiple trials Feet in Semi Tandem with Cone Stacking x multiple trials  Cross over stepping with alternating feet in front in // bars x multiple lengths; Sit to Stand with one UE support 1 x 8;    Not Performed: Airex Static Balance Progression 3 x 10s;   Airex Rhomberg 3 x 10s; Cross Stepping Left and Right  x multiple legnths; Cross Stepping Left and Right with Boxing Punches x multiple lengths Resisted Forwards and Backwards Walking in // Bars, unsteady with backwards walking;  Seated Long Arc Quads 2 x 10 5# Ankle Weight (AW); Standing Marches with 3# AW 2 x 10; Step Ups with 3# AW 2 x 10; Step Ups with Claps BLE 2 x 10s; Stepping on Airex 2 x 10;  Stepping Over 6" Hurdles in // bars with 3# AW, 4 x multiple lengths; Semi-Tandem Stance Level Ground 3 x 10s ; Semi-Tandem with Reaching task BLE in front 2 x multiple lengths; Sitting with Large Amplitude stomp and clap 2 x 10 BLE Static Stance, Level Surface with Head Turns (HT)  2 x 15s; Walking in // bars with HT 2 x multiple lengths;  Walking with Weaving through 6 cones x 4;   Walking with PT facilitated Arm Swing in hallway ~437ft; Weaving Through 6 Cones with 6" hurdles x 4;  Weaving through 6 Cones with 12" hurdles x 4;   PATIENT EDUCATION:  Education details: Plan of Care  Person educated: Patient and Spouse Education method: Explanation Education comprehension: verbalized understanding   HOME EXERCISE PROGRAM:  Access Code: [email or web address] URL: [email or web address] Date: 10/05/2022 Prepared by: Ria Comment  Exercises - Sit to Stand with Counter Support  - 1 x daily - 7 x weekly - 3 sets - 10 reps - Standing March with Counter Support  - 1 x daily - 7 x weekly - 3 sets - 10 reps - Standing Romberg to 1/2 Tandem Stance  - 1 x daily - 7 x weekly - 3 x 30s with each foot forward hold   ASSESSMENT:  CLINICAL IMPRESSION: Patient arrived to physical therapy highly motivated. PT discussed continuing plan of care prior to exercise due to recent report of fall. Pt agreed to continue physical therapy in order to improve stepping mechanics. Today's session with a continued focus on improving dynamic balance. He still presents with minor imbalance with single leg stance tasks. PT worked on turning tasks with a focus on slow wide turns to reduce freezing and falls. With today's performance today; pt will continue to benefit from skilled physical therapy to address deficits in strength, balance, and mobility in order to return to full function at home.  OBJECTIVE IMPAIRMENTS: decreased activity tolerance, decreased balance, decreased coordination, decreased endurance, decreased mobility, difficulty walking, decreased ROM, decreased strength, and impaired sensation.   ACTIVITY LIMITATIONS: standing, squatting, stairs, transfers, and bed mobility  PARTICIPATION LIMITATIONS: laundry, driving, and community activity  PERSONAL FACTORS: Age, Fitness, Past/current experiences, and 3+ comorbidities: Atrial Fibrillation, CAD, Parkinson's,   are also affecting  patient's functional outcome.   REHAB POTENTIAL: Good  CLINICAL DECISION MAKING: Evolving/moderate complexity  EVALUATION COMPLEXITY: High   GOALS: Goals reviewed with patient? No  SHORT TERM GOALS: Target date:   Pt will be independent with HEP in order to improve strength and balance in order to decrease fall risk and improve function at home. Baseline:  Goal status: PROGRESSING   LONG TERM GOALS: Target date: 01/10/2023   Pt will increase FOTO to at least 64 to demonstrate significant improvement in function at home related to balance  Baseline: 08/08: 54 09/27/2022: 64 Goal status: ACHIEVED  2.  Pt will improve BERG by at least 3 points in order to demonstrate clinically significant improvement in balance.   Baseline: 08/08: 27/56; 09/27/22: 26/56 Goal status: ONGOING  3. Pt will decrease TUG to below 14 seconds/decrease in order to demonstrate decreased fall risk.  Baseline: 08/06: 21.9; 09/27/22: 8.85, 10/16/22: 8.67 Goal status: ACHIEVED  4. Pt will decrease 5TSTS by at least 3 seconds in order to demonstrate clinically significant improvement in LE strength Baseline: 08/08: 26s; 09/27/22: 16.43s, 10/16/2022: 10.70 Goal status: ACHIEVED  5. Pt will improve ABC by at least 13% in order to demonstrate clinically significant improvement in balance confidence. Baseline: 08/08: 63% 09/27/22: 61.8% Goal status: ONGOING  PLAN: PT FREQUENCY: 2x/week  PT DURATION: 8 weeks  PLANNED INTERVENTIONS: Therapeutic exercises, Therapeutic activity, Neuromuscular re-education, Balance training, Gait training, Patient/Family education, Self Care, Joint mobilization, Joint manipulation, Vestibular training, Canalith repositioning, Orthotic/Fit training, DME instructions, Dry Needling, Electrical stimulation, Spinal manipulation, Spinal mobilization, Cryotherapy, Moist  heat, Taping, Traction, Ultrasound, Ionotophoresis 4mg /ml Dexamethasone, Manual therapy, and Re-evaluation.  PLAN  FOR NEXT SESSION:  update additional outcome measures, discharge, BPPV retesting, continue progressing strength and balance program. Review and modify HEP   Sharalyn Ink Huprich PT, DPT, GCS  Juanda Luba, SPT

## 2022-11-05 ENCOUNTER — Ambulatory Visit: Payer: Medicare HMO | Attending: Neurology

## 2022-11-05 DIAGNOSIS — R2681 Unsteadiness on feet: Secondary | ICD-10-CM | POA: Diagnosis not present

## 2022-11-05 DIAGNOSIS — M6281 Muscle weakness (generalized): Secondary | ICD-10-CM | POA: Insufficient documentation

## 2022-11-05 NOTE — Therapy (Cosign Needed)
OUTPATIENT PHYSICAL THERAPY BALANCE TREATMENT   Patient Name: Daniel [name] MRN: [id number] DOB:08/01/1940, 82 y.o., male Today's Date: 11/06/2022  END OF SESSION:  PT End of Session - 11/05/22 1104     Visit Number 17    Number of Visits 25    Date for PT Re-Evaluation 11/20/22    Authorization Type eval: 08/28/22    PT Start Time 1103    PT Stop Time 1145    PT Time Calculation (min) 42 min    Equipment Utilized During Treatment Gait belt    Activity Tolerance Patient tolerated treatment well    Behavior During Therapy WFL for tasks assessed/performed            Past Medical History:  Diagnosis Date   AF (atrial fibrillation) (HCC)    B12 deficiency    Coronary artery disease    DH (dermatitis herpetiformis)    Dysphagia    Hypertension    Presence of permanent cardiac pacemaker    2006   Primary parkinsonism (HCC)    Prostate cancer (HCC) 2014   Past Surgical History:  Procedure Laterality Date   APPENDECTOMY     CARDIAC CATHETERIZATION     CATARACT EXTRACTION W/PHACO Left 05/04/2020   Procedure: CATARACT EXTRACTION PHACO AND INTRAOCULAR LENS PLACEMENT (IOC) LEFT  6.76 00:59.9 11.3%;  Surgeon: Lockie Mola, MD;  Location: [facility];  Service: Ophthalmology;  Laterality: Left;   CATARACT EXTRACTION W/PHACO Right 05/18/2020   Procedure: CATARACT EXTRACTION PHACO AND INTRAOCULAR LENS PLACEMENT (IOC) RIGHT 15.56 01:52.6 13.8%;  Surgeon: Lockie Mola, MD;  Location: [facility];  Service: Ophthalmology;  Laterality: Right;   COLONOSCOPY  2008, 2014   CYSTOSCOPY WITH LITHOLAPAXY N/A 09/17/2019   Procedure: CYSTOSCOPY WITH LITHOLAPAXY;  Surgeon: Orson Ape, MD;  Location: [facility];  Service: Urology;  Laterality: N/A;   ESOPHAGOGASTRODUODENOSCOPY (EGD) WITH PROPOFOL N/A 10/13/2015   Procedure: ESOPHAGOGASTRODUODENOSCOPY (EGD) WITH PROPOFOL;  Surgeon: Christena Deem, MD;  Location: [facility];  Service: Endoscopy;  Laterality:  N/A;   FASCIOTOMY Left 01/30/2019   Procedure: I&D BELOW FASCIA FOOT SINGLE LEFT;  Surgeon: Gwyneth Revels, DPM;  Location: [facility];  Service: Podiatry;  Laterality: Left;   GREEN LIGHT LASER TURP (TRANSURETHRAL RESECTION OF PROSTATE N/A 09/17/2019   Procedure: GREEN LIGHT LASER TURP (TRANSURETHRAL RESECTION OF PROSTATE;  Surgeon: Orson Ape, MD;  Location: [facility];  Service: Urology;  Laterality: N/A;   PACEMAKER PLACEMENT  2006   There are no problems to display for this patient.   PCP: Jerl Mina, MD  REFERRING PROVIDER: Jerl Mina, MD   REFERRING DIAG: Primary Parkinsonism   RATIONALE FOR EVALUATION AND TREATMENT: Rehabilitation  THERAPY DIAG: Muscle weakness (generalized)  Unsteadiness on feet  ONSET DATE: 07/15/2022  FOLLOW-UP APPT SCHEDULED WITH REFERRING PROVIDER:  Didn't Address    SUBJECTIVE:  SUBJECTIVE STATEMENT:  Balance Impairment & Parkinson's  PERTINENT HISTORY:  Patient reports he's had a dozen falls within the last year; four occurred since his last visit to ED 07/15/2022. Reports spinning with bending over to perform laundry. He fell in his churches bathroom and hit his head. Patient had a laceration on his left ear which was repaired and his was discharged after further evaluation. Pt diagnosed with Parkinson's Disease. He goes to [employer]. He would like to be able to move without fear of falling & get out of bed without any help.    Pain: No Numbness/Tingling: Yes Numbness present BLE, below knee down to plantar aspect of the foot.  Focal Weakness: No Recent changes in overall health/medication: No Prior history of physical therapy for balance:  No Dominant hand: right  Imaging: Yes, Pt reports imaging negative for hematoma, hemorrhaging Red  flags: Negative for bowel/bladder changes, saddle paresthesia, personal history of cancer, h/o spinal tumors, h/o compression fx, h/o abdominal aneurysm, abdominal pain, chills/fever, night sweats, nausea, vomiting, unrelenting pain, first onset of insidious LBP <20 y/o  PRECAUTIONS: Fall  WEIGHT BEARING RESTRICTIONS: No FALLS: Has patient fallen in last 6 months? Yes. Number of falls 4 , Directional pattern for falls: Yes Backwards, turns and falls.  Living Environment Lives with: lives with their spouse Lives in: House/apartment Stairs: No Has following equipment at home: Single point cane and Environmental consultant - 2 wheeled  Prior level of function: Independent and Independent with household mobility with device  Occupational demands: Retired  Hobbies: TEFL teacher, [employer] 1x/wk  Patient Goals: He would like to be able to move without fear of falling & get out of bed without any help.   OBJECTIVE:   Patient Surveys  FOTO: 42, Predicted to improve 64 (Visit 19) ABC: Deferred  Cognition Patient is oriented to person, place, and time.  Recent memory is intact.  Remote memory is intact.  Attention span and concentration are intact.  Expressive speech is intact, pt speech is slightly muffled/hyphophonic.  Patient's fund of knowledge is within normal limits for educational level.   Gross Musculoskeletal Assessment Tremor: None Bulk: Normal Tone: Normal "Large mass" in posterior upper thoracic back; pt noted no pain to palpation. MD aware  Posture: Mildly kyphotic in thoracic spine  AROM AROM (Normal range in degrees) AROM   Lumbar   Flexion (65)   Extension (30)   Right lateral flexion (25)   Left lateral flexion (25)   Right rotation (30)   Left rotation (30)       Hip Right Left  Flexion (125)    Extension (15)    Abduction (40)    Adduction     Internal Rotation (45)    External Rotation (45)        Knee    Flexion (135)    Extension (0)        Ankle     Dorsiflexion (20)    Plantarflexion (50)    Inversion (35)    Eversion (15)    (* = pain; Blank rows = not tested)  LE MMT: MMT (out of 5) Right  Left   Hip flexion 4+ 4+  Hip extension    Hip abduction    Hip adduction    Hip internal rotation 4 4  Hip external rotation 4 4  Knee flexion 4- 4-  Knee extension 4- 4-  Ankle dorsiflexion 4- 4-  Ankle plantarflexion    Ankle inversion  Ankle eversion    (* = pain; Blank rows = not tested)  Sensation Grossly intact to light touch throughout bilateral LEs as determined by testing dermatomes L2-S2. Proprioception, stereognosis, and hot/cold testing deferred on this date.  Sensation is slightly more Right > Left L4-5, S1   Reflexes Deferred  Cranial Nerves Deferred  Coordination/Cerebellar Finger to Nose: Slight Deviation from finger, more impaired noted on left more than right  Heel to Shin: WNL Rapid alternating movements: WNL Finger Opposition: WNL Pronator Drift: Negative  Bed mobility: Supine to sit CGA, pt reported assistance from spouse to return to seated position  Transfers: Assistive device utilized: Environmental consultant - 2 wheeled  Sit to stand: Min A, MinA for power up phase only  Stand to sit: CGA Chair to chair: Deferred Floor: Deferred  Curb:  Level of Assistance: Deferred Assistive device utilized: Deferred Curb Comments: Deferred  Stairs: Deferred  Gait: Gait pattern: step to pattern, decreased arm swing- Right, decreased arm swing- Left, decreased step length- Right, decreased step length- Left, and decreased stride length Distance walked: 8m Assistive device utilized: Walker - 2 wheeled Level of assistance: Modified independence Comments: Patient presents with decreased step length bilaterally;  Functional Outcome Measures  Results Comments  BERG    DGI    FGA    TUG 21.9 seconds Required verbal cues, assistance for transfer    5TSTS    6 Minute Walk Test    10 Meter Gait Speed  Self-selected: 11.3s = 0.88 m/s; With front wheeled walker, slightly below full community ambulation speed  (Blank rows = not tested)  VESTIBULAR TESTING Modified Dix-Hallpike R: negative, L: Negative    TODAY'S TREATMENT   SUBJECTIVE: Patient arrived to physical therapy with a report of three falls within the last two weeks. Patient denied dizziness, vertigo and head trauma.    PAIN: No resting pain   Neuromuscular Re-ed  NuStep Level 1-5 x 6 min for warm up and interval history, PT manually adjusted resistance;  High Marches in // bars x 4 trials Stepping over 6" Hurdles in // bars x 4 trials;  Stepping over 12" Hurdles in // bars x 2 trials (Required UE Support);  Crossover Stepping Left In Front of Right in // bars x 2 trials (difficulty sequencing, CGA required) Crossover Stepping Right in Front of Left in // bars x 2 trials;  5TSTS: 12.8s TUG: 12.6s    Not Performed: Airex Static Balance Progression 3 x 10s;   Airex Rhomberg 3 x 10s; Cross Stepping Left and Right  x multiple legnths; Cross Stepping Left and Right with Boxing Punches x multiple lengths Resisted Forwards and Backwards Walking in // Bars, unsteady with backwards walking;  Seated Long Arc Quads 2 x 10 5# Ankle Weight (AW); Standing Marches with 3# AW 2 x 10; Step Ups with 3# AW 2 x 10; Step Ups with Claps BLE 2 x 10s; Stepping on Airex 2 x 10;  Stepping Over 6" Hurdles in // bars with 3# AW, 4 x multiple lengths; Semi-Tandem Stance Level Ground 3 x 10s ; Semi-Tandem with Reaching task BLE in front 2 x multiple lengths; Sitting with Large Amplitude stomp and clap 2 x 10 BLE Static Stance, Level Surface with Head Turns (HT) 2 x 15s; Walking in // bars with HT 2 x multiple lengths;  Walking with Weaving through 6 cones x 4;  Walking with PT facilitated Arm Swing in hallway ~46ft; Weaving Through 6 Cones with 6" hurdles x 4;  Weaving through  6 Cones with 12" hurdles x 4;   PATIENT EDUCATION:   Education details: Plan of Care  Person educated: Patient and Spouse Education method: Explanation Education comprehension: verbalized understanding   HOME EXERCISE PROGRAM:  Access Code: [email or web address] URL: [email or web address] Date: 10/05/2022 Prepared by: Ria Comment  Exercises - Sit to Stand with Counter Support  - 1 x daily - 7 x weekly - 3 sets - 10 reps - Standing March with Counter Support  - 1 x daily - 7 x weekly - 3 sets - 10 reps - Standing Romberg to 1/2 Tandem Stance  - 1 x daily - 7 x weekly - 3 x 30s with each foot forward hold   ASSESSMENT:  CLINICAL IMPRESSION: Patient arrived to physical therapy highly motivated to improve balance. Patient still presents with moderate unsteadiness when performing 90 turns. When performing turns the patient's BOS narrows and he intermittently experiences a freeing episode. PT educated patient on sequencing techniques or wider turns to prevent falling and feet crossing over. Since his last appointment the patient reported multiple falls associated with LOB during a turn beside. Skin inspection significant with multiple skin tears in the hand and elbow. With today's performance today; pt will continue to benefit from skilled physical therapy to address deficits in strength, balance, and mobility in order to return to full function at home.  OBJECTIVE IMPAIRMENTS: decreased activity tolerance, decreased balance, decreased coordination, decreased endurance, decreased mobility, difficulty walking, decreased ROM, decreased strength, and impaired sensation.   ACTIVITY LIMITATIONS: standing, squatting, stairs, transfers, and bed mobility  PARTICIPATION LIMITATIONS: laundry, driving, and community activity  PERSONAL FACTORS: Age, Fitness, Past/current experiences, and 3+ comorbidities: Atrial Fibrillation, CAD, Parkinson's,   are also affecting patient's functional outcome.   REHAB POTENTIAL: Good  CLINICAL DECISION MAKING:  Evolving/moderate complexity  EVALUATION COMPLEXITY: High   GOALS: Goals reviewed with patient? No  SHORT TERM GOALS: Target date:   Pt will be independent with HEP in order to improve strength and balance in order to decrease fall risk and improve function at home. Baseline:  Goal status: PROGRESSING   LONG TERM GOALS: Target date: 01/29/2023   Pt will increase FOTO to at least 64 to demonstrate significant improvement in function at home related to balance  Baseline: 08/08: 54 09/27/2022: 64 Goal status: ACHIEVED  2.  Pt will improve BERG by at least 3 points in order to demonstrate clinically significant improvement in balance.   Baseline: 08/08: 27/56; 09/27/22: 26/56 Goal status: ONGOING  3. Pt will decrease TUG to below 14 seconds/decrease in order to demonstrate decreased fall risk.  Baseline: 08/06: 21.9; 09/27/22: 8.85, 10/16/22: 8.67 Goal status: ACHIEVED  4. Pt will decrease 5TSTS by at least 3 seconds in order to demonstrate clinically significant improvement in LE strength Baseline: 08/08: 26s; 09/27/22: 16.43s, 10/16/2022: 10.70 Goal status: ACHIEVED  5. Pt will improve ABC by at least 13% in order to demonstrate clinically significant improvement in balance confidence. Baseline: 08/08: 63% 09/27/22: 61.8% Goal status: ONGOING  PLAN: PT FREQUENCY: 2x/week  PT DURATION: 8 weeks  PLANNED INTERVENTIONS: Therapeutic exercises, Therapeutic activity, Neuromuscular re-education, Balance training, Gait training, Patient/Family education, Self Care, Joint mobilization, Joint manipulation, Vestibular training, Canalith repositioning, Orthotic/Fit training, DME instructions, Dry Needling, Electrical stimulation, Spinal manipulation, Spinal mobilization, Cryotherapy, Moist heat, Taping, Traction, Ultrasound, Ionotophoresis 4mg /ml Dexamethasone, Manual therapy, and Re-evaluation.  PLAN FOR NEXT SESSION:  update additional outcome measures, discharge, BPPV retesting,  continue progressing strength and balance program. Review and modify HEP   Barbara Cower  D Huprich PT, DPT, GCS  Chizuko Trine, SPT

## 2022-11-07 ENCOUNTER — Ambulatory Visit: Payer: Medicare HMO

## 2022-11-07 DIAGNOSIS — M6281 Muscle weakness (generalized): Secondary | ICD-10-CM | POA: Diagnosis not present

## 2022-11-07 DIAGNOSIS — R2681 Unsteadiness on feet: Secondary | ICD-10-CM | POA: Diagnosis not present

## 2022-11-07 NOTE — Therapy (Cosign Needed)
OUTPATIENT PHYSICAL THERAPY BALANCE TREATMENT   Patient Name: Daniel [name] MRN: [id number] DOB:1940-04-21, 82 y.o., male Today's Date: 11/08/2022  END OF SESSION:  PT End of Session - 11/07/22 1017     Visit Number 18    Number of Visits 25    Date for PT Re-Evaluation 11/20/22    Authorization Type eval: 08/28/22    PT Start Time 1015    PT Stop Time 1100    PT Time Calculation (min) 45 min    Equipment Utilized During Treatment Gait belt    Activity Tolerance Patient tolerated treatment well    Behavior During Therapy WFL for tasks assessed/performed            Past Medical History:  Diagnosis Date   AF (atrial fibrillation) (HCC)    B12 deficiency    Coronary artery disease    DH (dermatitis herpetiformis)    Dysphagia    Hypertension    Presence of permanent cardiac pacemaker    2006   Primary parkinsonism [facility])    Prostate cancer (HCC) 2014   Past Surgical History:  Procedure Laterality Date   APPENDECTOMY     CARDIAC CATHETERIZATION     CATARACT EXTRACTION W/PHACO Left 05/04/2020   Procedure: CATARACT EXTRACTION PHACO AND INTRAOCULAR LENS PLACEMENT (IOC) LEFT  6.76 00:59.9 11.3%;  Surgeon: Lockie Mola, MD;  Location: [facility];  Service: Ophthalmology;  Laterality: Left;   CATARACT EXTRACTION W/PHACO Right 05/18/2020   Procedure: CATARACT EXTRACTION PHACO AND INTRAOCULAR LENS PLACEMENT (IOC) RIGHT 15.56 01:52.6 13.8%;  Surgeon: Lockie Mola, MD;  Location: [facility];  Service: Ophthalmology;  Laterality: Right;   COLONOSCOPY  2008, 2014   CYSTOSCOPY WITH LITHOLAPAXY N/A 09/17/2019   Procedure: CYSTOSCOPY WITH LITHOLAPAXY;  Surgeon: Orson Ape, MD;  Location: [facility];  Service: Urology;  Laterality: N/A;   ESOPHAGOGASTRODUODENOSCOPY (EGD) WITH PROPOFOL N/A 10/13/2015   Procedure: ESOPHAGOGASTRODUODENOSCOPY (EGD) WITH PROPOFOL;  Surgeon: Christena Deem, MD;  Location: [facility];  Service: Endoscopy;  Laterality:  N/A;   FASCIOTOMY Left 01/30/2019   Procedure: I&D BELOW FASCIA FOOT SINGLE LEFT;  Surgeon: Gwyneth Revels, DPM;  Location: [facility];  Service: Podiatry;  Laterality: Left;   GREEN LIGHT LASER TURP (TRANSURETHRAL RESECTION OF PROSTATE N/A 09/17/2019   Procedure: GREEN LIGHT LASER TURP (TRANSURETHRAL RESECTION OF PROSTATE;  Surgeon: Orson Ape, MD;  Location: [facility];  Service: Urology;  Laterality: N/A;   PACEMAKER PLACEMENT  2006   There are no problems to display for this patient.   PCP: Jerl Mina, MD  REFERRING PROVIDER: Lonell Face, MD   REFERRING DIAG: Primary Parkinsonism   RATIONALE FOR EVALUATION AND TREATMENT: Rehabilitation  THERAPY DIAG: Muscle weakness (generalized)  Unsteadiness on feet  ONSET DATE: 07/15/2022  FOLLOW-UP APPT SCHEDULED WITH REFERRING PROVIDER:  Didn't Address    SUBJECTIVE:  SUBJECTIVE STATEMENT:  Balance Impairment & Parkinson's  PERTINENT HISTORY:  Patient reports he's had a dozen falls within the last year; four occurred since his last visit to ED 07/15/2022. Reports spinning with bending over to perform laundry. He fell in his churches bathroom and hit his head. Patient had a laceration on his left ear which was repaired and his was discharged after further evaluation. Pt diagnosed with Parkinson's Disease. He goes to [employer]. He would like to be able to move without fear of falling & get out of bed without any help.    Pain: No Numbness/Tingling: Yes Numbness present BLE, below knee down to plantar aspect of the foot.  Focal Weakness: No Recent changes in overall health/medication: No Prior history of physical therapy for balance:  No Dominant hand: right  Imaging: Yes, Pt reports imaging negative for hematoma, hemorrhaging Red  flags: Negative for bowel/bladder changes, saddle paresthesia, personal history of cancer, h/o spinal tumors, h/o compression fx, h/o abdominal aneurysm, abdominal pain, chills/fever, night sweats, nausea, vomiting, unrelenting pain, first onset of insidious LBP <20 y/o  PRECAUTIONS: Fall  WEIGHT BEARING RESTRICTIONS: No FALLS: Has patient fallen in last 6 months? Yes. Number of falls 4 , Directional pattern for falls: Yes Backwards, turns and falls.  Living Environment Lives with: lives with their spouse Lives in: House/apartment Stairs: No Has following equipment at home: Single point cane and Environmental consultant - 2 wheeled  Prior level of function: Independent and Independent with household mobility with device  Occupational demands: Retired  Hobbies: TEFL teacher, [employer] 1x/wk  Patient Goals: He would like to be able to move without fear of falling & get out of bed without any help.   OBJECTIVE:   Patient Surveys  FOTO: 21, Predicted to improve 64 (Visit 19) ABC: Deferred  Cognition Patient is oriented to person, place, and time.  Recent memory is intact.  Remote memory is intact.  Attention span and concentration are intact.  Expressive speech is intact, pt speech is slightly muffled/hyphophonic.  Patient's fund of knowledge is within normal limits for educational level.   Gross Musculoskeletal Assessment Tremor: None Bulk: Normal Tone: Normal "Large mass" in posterior upper thoracic back; pt noted no pain to palpation. MD aware  Posture: Mildly kyphotic in thoracic spine  AROM AROM (Normal range in degrees) AROM   Lumbar   Flexion (65)   Extension (30)   Right lateral flexion (25)   Left lateral flexion (25)   Right rotation (30)   Left rotation (30)       Hip Right Left  Flexion (125)    Extension (15)    Abduction (40)    Adduction     Internal Rotation (45)    External Rotation (45)        Knee    Flexion (135)    Extension (0)        Ankle     Dorsiflexion (20)    Plantarflexion (50)    Inversion (35)    Eversion (15)    (* = pain; Blank rows = not tested)  LE MMT: MMT (out of 5) Right  Left   Hip flexion 4+ 4+  Hip extension    Hip abduction    Hip adduction    Hip internal rotation 4 4  Hip external rotation 4 4  Knee flexion 4- 4-  Knee extension 4- 4-  Ankle dorsiflexion 4- 4-  Ankle plantarflexion    Ankle inversion  Ankle eversion    (* = pain; Blank rows = not tested)  Sensation Grossly intact to light touch throughout bilateral LEs as determined by testing dermatomes L2-S2. Proprioception, stereognosis, and hot/cold testing deferred on this date.  Sensation is slightly more Right > Left L4-5, S1   Reflexes Deferred  Cranial Nerves Deferred  Coordination/Cerebellar Finger to Nose: Slight Deviation from finger, more impaired noted on left more than right  Heel to Shin: WNL Rapid alternating movements: WNL Finger Opposition: WNL Pronator Drift: Negative  Bed mobility: Supine to sit CGA, pt reported assistance from spouse to return to seated position  Transfers: Assistive device utilized: Environmental consultant - 2 wheeled  Sit to stand: Min A, MinA for power up phase only  Stand to sit: CGA Chair to chair: Deferred Floor: Deferred  Curb:  Level of Assistance: Deferred Assistive device utilized: Deferred Curb Comments: Deferred  Stairs: Deferred  Gait: Gait pattern: step to pattern, decreased arm swing- Right, decreased arm swing- Left, decreased step length- Right, decreased step length- Left, and decreased stride length Distance walked: 53m Assistive device utilized: Walker - 2 wheeled Level of assistance: Modified independence Comments: Patient presents with decreased step length bilaterally;  Functional Outcome Measures  Results Comments  BERG    DGI    FGA    TUG 21.9 seconds Required verbal cues, assistance for transfer    5TSTS    6 Minute Walk Test    10 Meter Gait Speed  Self-selected: 11.3s = 0.88 m/s; With front wheeled walker, slightly below full community ambulation speed  (Blank rows = not tested)  VESTIBULAR TESTING Modified Dix-Hallpike R: negative, L: Negative    TODAY'S TREATMENT   SUBJECTIVE: Patient arrived to physical therapy with report of improvements with LE strength. No episodes of dizziness reported within the last week. Pt endorsed scheduling discharge from PT in the upcoming sessions.   PAIN: No resting pain   Neuromuscular Re-ed  NuStep Level 1-5 x 6 min for warm up and interval history, PT manually adjusted resistance;   High Marches in // bars x 4 trials 6" Step up with no UE support (CGA) 2 x 20;  12" Step Up with BUE Support 2 x 20;  Sit to Stand with BUE Support 1 x 10 (Close Supervision);  Sit to Stand with 4# Med Ball 1 x 10 (MinA with Power Up);  TRX Partial Squats 2 x 10 (CGA, Posterior Sway);  Stepping over 6" and 12" hurdles in // bars;  Side Stepping onto Airex pads 1 x 10;  Side Stepping onto Airex pads with 6" hurdle in between 1 x 10;  Nautilus Resisted Gait Forward, Retro, Side stepping 2x ea direction (minA for balance with right side stepping);   Not Performed: Airex Static Balance Progression 3 x 10s;   Airex Rhomberg 3 x 10s; Cross Stepping Left and Right  x multiple legnths; Cross Stepping Left and Right with Boxing Punches x multiple lengths Resisted Forwards and Backwards Walking in // Bars, unsteady with backwards walking;  Seated Long Arc Quads 2 x 10 5# Ankle Weight (AW); Standing Marches with 3# AW 2 x 10; Step Ups with 3# AW 2 x 10; Step Ups with Claps BLE 2 x 10s; Stepping on Airex 2 x 10;  Stepping Over 6" Hurdles in // bars with 3# AW, 4 x multiple lengths; Semi-Tandem Stance Level Ground 3 x 10s ; Semi-Tandem with Reaching task BLE in front 2 x multiple lengths; Sitting with Large Amplitude stomp and clap  2 x 10 BLE Static Stance, Level Surface with Head Turns (HT) 2 x 15s; Walking in //  bars with HT 2 x multiple lengths;  Walking with Weaving through 6 cones x 4;  Walking with PT facilitated Arm Swing in hallway ~48ft; Weaving Through 6 Cones with 6" hurdles x 4;  Weaving through 6 Cones with 12" hurdles x 4;   PATIENT EDUCATION:  Education details: Plan of Care  Person educated: Patient and Spouse Education method: Explanation Education comprehension: verbalized understanding   HOME EXERCISE PROGRAM:  Access Code: [email or web address] URL: [email or web address] Date: 10/05/2022 Prepared by: Ria Comment  Exercises - Sit to Stand with Counter Support  - 1 x daily - 7 x weekly - 3 sets - 10 reps - Standing March with Counter Support  - 1 x daily - 7 x weekly - 3 sets - 10 reps - Standing Romberg to 1/2 Tandem Stance  - 1 x daily - 7 x weekly - 3 x 30s with each foot forward hold   ASSESSMENT:  CLINICAL IMPRESSION: Patient arrived to physical therapy highly motivated to improve balance. Today's session with a main focus on improving dynamic balance. Patient demonstrated improvements with standing marches and sit to stands; no major bouts of LOB. However, patient required verbal and tactile cues throughout session in order to maintain balance and wide BOS. Although his overall LE strength and static balance has improved he is still limited with tasks requiring prolonged single leg stance and narrow BOS secondary to balance impairments. With today's performance today; pt will continue to benefit from skilled physical therapy to address deficits in strength, balance, and mobility in order to return to full function at home.  OBJECTIVE IMPAIRMENTS: decreased activity tolerance, decreased balance, decreased coordination, decreased endurance, decreased mobility, difficulty walking, decreased ROM, decreased strength, and impaired sensation.   ACTIVITY LIMITATIONS: standing, squatting, stairs, transfers, and bed mobility  PARTICIPATION LIMITATIONS: laundry, driving, and  community activity  PERSONAL FACTORS: Age, Fitness, Past/current experiences, and 3+ comorbidities: Atrial Fibrillation, CAD, Parkinson's,   are also affecting patient's functional outcome.   REHAB POTENTIAL: Good  CLINICAL DECISION MAKING: Evolving/moderate complexity  EVALUATION COMPLEXITY: High   GOALS: Goals reviewed with patient? No  SHORT TERM GOALS: Target date:   Pt will be independent with HEP in order to improve strength and balance in order to decrease fall risk and improve function at home. Baseline:  Goal status: PROGRESSING   LONG TERM GOALS: Target date: 01/31/2023   Pt will increase FOTO to at least 64 to demonstrate significant improvement in function at home related to balance  Baseline: 08/08: 54 09/27/2022: 64 Goal status: ACHIEVED  2.  Pt will improve BERG by at least 3 points in order to demonstrate clinically significant improvement in balance.   Baseline: 08/08: 27/56; 09/27/22: 26/56 Goal status: ONGOING  3. Pt will decrease TUG to below 14 seconds/decrease in order to demonstrate decreased fall risk.  Baseline: 08/06: 21.9; 09/27/22: 8.85, 10/16/22: 8.67 Goal status: ACHIEVED  4. Pt will decrease 5TSTS by at least 3 seconds in order to demonstrate clinically significant improvement in LE strength Baseline: 08/08: 26s; 09/27/22: 16.43s, 10/16/2022: 10.70 (UE Support) Goal status: ACHIEVED  5. Pt will improve ABC by at least 13% in order to demonstrate clinically significant improvement in balance confidence. Baseline: 08/08: 63% 09/27/22: 61.8% Goal status: ONGOING  PLAN: PT FREQUENCY: 2x/week  PT DURATION: 8 weeks  PLANNED INTERVENTIONS: Therapeutic exercises, Therapeutic activity, Neuromuscular re-education, Balance training, Gait training, Patient/Family education, Self Care,  Joint mobilization, Joint manipulation, Vestibular training, Canalith repositioning, Orthotic/Fit training, DME instructions, Dry Needling, Electrical stimulation, Spinal  manipulation, Spinal mobilization, Cryotherapy, Moist heat, Taping, Traction, Ultrasound, Ionotophoresis 4mg /ml Dexamethasone, Manual therapy, and Re-evaluation.  PLAN FOR NEXT SESSION:  update additional outcome measures,  continue progressing strength and balance program. Review and modify HEP   Lynnea Maizes PT, DPT, GCS  Kayron Hicklin, SPT 11/08/2022 1:42 PM

## 2022-11-12 ENCOUNTER — Ambulatory Visit: Payer: Medicare HMO

## 2022-11-14 ENCOUNTER — Ambulatory Visit: Payer: Medicare HMO

## 2022-11-14 DIAGNOSIS — R2681 Unsteadiness on feet: Secondary | ICD-10-CM

## 2022-11-14 DIAGNOSIS — M6281 Muscle weakness (generalized): Secondary | ICD-10-CM

## 2022-11-14 NOTE — Therapy (Unsigned)
OUTPATIENT PHYSICAL THERAPY BALANCE TREATMENT   Patient Name: Daniel [name] MRN: [id number] DOB:1940-05-29, 82 y.o., male Today's Date: 11/15/2022  END OF SESSION:  PT End of Session - 11/14/22 1354     Visit Number 19    Number of Visits 25    Date for PT Re-Evaluation 11/20/22    Authorization Type eval: 08/28/22    PT Start Time 1400    PT Stop Time 1445    PT Time Calculation (min) 45 min    Equipment Utilized During Treatment Gait belt    Activity Tolerance Patient tolerated treatment well    Behavior During Therapy WFL for tasks assessed/performed            Past Medical History:  Diagnosis Date   AF (atrial fibrillation) (HCC)    B12 deficiency    Coronary artery disease    DH (dermatitis herpetiformis)    Dysphagia    Hypertension    Presence of permanent cardiac pacemaker    2006   Primary parkinsonism (HCC)    Prostate cancer (HCC) 2014   Past Surgical History:  Procedure Laterality Date   APPENDECTOMY     CARDIAC CATHETERIZATION     CATARACT EXTRACTION W/PHACO Left 05/04/2020   Procedure: CATARACT EXTRACTION PHACO AND INTRAOCULAR LENS PLACEMENT (IOC) LEFT  6.76 00:59.9 11.3%;  Surgeon: Lockie Mola, MD;  Location: [facility];  Service: Ophthalmology;  Laterality: Left;   CATARACT EXTRACTION W/PHACO Right 05/18/2020   Procedure: CATARACT EXTRACTION PHACO AND INTRAOCULAR LENS PLACEMENT (IOC) RIGHT 15.56 01:52.6 13.8%;  Surgeon: Lockie Mola, MD;  Location: [facility];  Service: Ophthalmology;  Laterality: Right;   COLONOSCOPY  2008, 2014   CYSTOSCOPY WITH LITHOLAPAXY N/A 09/17/2019   Procedure: CYSTOSCOPY WITH LITHOLAPAXY;  Surgeon: Orson Ape, MD;  Location: [facility];  Service: Urology;  Laterality: N/A;   ESOPHAGOGASTRODUODENOSCOPY (EGD) WITH PROPOFOL N/A 10/13/2015   Procedure: ESOPHAGOGASTRODUODENOSCOPY (EGD) WITH PROPOFOL;  Surgeon: Christena Deem, MD;  Location: [facility];  Service: Endoscopy;  Laterality:  N/A;   FASCIOTOMY Left 01/30/2019   Procedure: I&D BELOW FASCIA FOOT SINGLE LEFT;  Surgeon: Gwyneth Revels, DPM;  Location: [facility];  Service: Podiatry;  Laterality: Left;   GREEN LIGHT LASER TURP (TRANSURETHRAL RESECTION OF PROSTATE N/A 09/17/2019   Procedure: GREEN LIGHT LASER TURP (TRANSURETHRAL RESECTION OF PROSTATE;  Surgeon: Orson Ape, MD;  Location: [facility];  Service: Urology;  Laterality: N/A;   PACEMAKER PLACEMENT  2006   There are no problems to display for this patient.   PCP: Jerl Mina, MD  REFERRING PROVIDER: Lonell Face, MD   REFERRING DIAG: Primary Parkinsonism   RATIONALE FOR EVALUATION AND TREATMENT: Rehabilitation  THERAPY DIAG: Muscle weakness (generalized)  Unsteadiness on feet  ONSET DATE: 07/15/2022  FOLLOW-UP APPT SCHEDULED WITH REFERRING PROVIDER:  Didn't Address    SUBJECTIVE:  SUBJECTIVE STATEMENT:  Balance Impairment & Parkinson's  PERTINENT HISTORY:  Patient reports he's had a dozen falls within the last year; four occurred since his last visit to ED 07/15/2022. Reports spinning with bending over to perform laundry. He fell in his churches bathroom and hit his head. Patient had a laceration on his left ear which was repaired and his was discharged after further evaluation. Pt diagnosed with Parkinson's Disease. He goes to [employer]. He would like to be able to move without fear of falling & get out of bed without any help.    Pain: No Numbness/Tingling: Yes Numbness present BLE, below knee down to plantar aspect of the foot.  Focal Weakness: No Recent changes in overall health/medication: No Prior history of physical therapy for balance:  No Dominant hand: right  Imaging: Yes, Pt reports imaging negative for hematoma, hemorrhaging Red  flags: Negative for bowel/bladder changes, saddle paresthesia, personal history of cancer, h/o spinal tumors, h/o compression fx, h/o abdominal aneurysm, abdominal pain, chills/fever, night sweats, nausea, vomiting, unrelenting pain, first onset of insidious LBP <20 y/o  PRECAUTIONS: Fall  WEIGHT BEARING RESTRICTIONS: No FALLS: Has patient fallen in last 6 months? Yes. Number of falls 4 , Directional pattern for falls: Yes Backwards, turns and falls.  Living Environment Lives with: lives with their spouse Lives in: House/apartment Stairs: No Has following equipment at home: Single point cane and Environmental consultant - 2 wheeled  Prior level of function: Independent and Independent with household mobility with device  Occupational demands: Retired  Hobbies: TEFL teacher, [employer] 1x/wk  Patient Goals: He would like to be able to move without fear of falling & get out of bed without any help.   OBJECTIVE:   Patient Surveys  FOTO: 53, Predicted to improve 64 (Visit 19) ABC: Deferred  Cognition Patient is oriented to person, place, and time.  Recent memory is intact.  Remote memory is intact.  Attention span and concentration are intact.  Expressive speech is intact, pt speech is slightly muffled/hyphophonic.  Patient's fund of knowledge is within normal limits for educational level.   Gross Musculoskeletal Assessment Tremor: None Bulk: Normal Tone: Normal "Large mass" in posterior upper thoracic back; pt noted no pain to palpation. MD aware  Posture: Mildly kyphotic in thoracic spine  AROM AROM (Normal range in degrees) AROM   Lumbar   Flexion (65)   Extension (30)   Right lateral flexion (25)   Left lateral flexion (25)   Right rotation (30)   Left rotation (30)       Hip Right Left  Flexion (125)    Extension (15)    Abduction (40)    Adduction     Internal Rotation (45)    External Rotation (45)        Knee    Flexion (135)    Extension (0)        Ankle     Dorsiflexion (20)    Plantarflexion (50)    Inversion (35)    Eversion (15)    (* = pain; Blank rows = not tested)  LE MMT: MMT (out of 5) Right  Left   Hip flexion 4+ 4+  Hip extension    Hip abduction    Hip adduction    Hip internal rotation 4 4  Hip external rotation 4 4  Knee flexion 4- 4-  Knee extension 4- 4-  Ankle dorsiflexion 4- 4-  Ankle plantarflexion    Ankle inversion  Ankle eversion    (* = pain; Blank rows = not tested)  Sensation Grossly intact to light touch throughout bilateral LEs as determined by testing dermatomes L2-S2. Proprioception, stereognosis, and hot/cold testing deferred on this date.  Sensation is slightly more Right > Left L4-5, S1   Reflexes Deferred  Cranial Nerves Deferred  Coordination/Cerebellar Finger to Nose: Slight Deviation from finger, more impaired noted on left more than right  Heel to Shin: WNL Rapid alternating movements: WNL Finger Opposition: WNL Pronator Drift: Negative  Bed mobility: Supine to sit CGA, pt reported assistance from spouse to return to seated position  Transfers: Assistive device utilized: Environmental consultant - 2 wheeled  Sit to stand: Min A, MinA for power up phase only  Stand to sit: CGA Chair to chair: Deferred Floor: Deferred  Curb:  Level of Assistance: Deferred Assistive device utilized: Deferred Curb Comments: Deferred  Stairs: Deferred  Gait: Gait pattern: step to pattern, decreased arm swing- Right, decreased arm swing- Left, decreased step length- Right, decreased step length- Left, and decreased stride length Distance walked: 17m Assistive device utilized: Walker - 2 wheeled Level of assistance: Modified independence Comments: Patient presents with decreased step length bilaterally;  Functional Outcome Measures  Results Comments  BERG    DGI    FGA    TUG 21.9 seconds Required verbal cues, assistance for transfer    5TSTS    6 Minute Walk Test    10 Meter Gait Speed  Self-selected: 11.3s = 0.88 m/s; With front wheeled walker, slightly below full community ambulation speed  (Blank rows = not tested)  VESTIBULAR TESTING Modified Dix-Hallpike R: negative, L: Negative    TODAY'S TREATMENT   SUBJECTIVE: Patient arrived to physical therapy reporting two falls since the last therapy appointment. Both falls involved dynamic reaching. He denies any significant trauma except for skin tears. Denies head injury. He is still participates in [place] outside of therapy.  PAIN: No resting pain   Neuromuscular Re-ed  NuStep Level 1-6 x 6 min for warm up and interval history, PT manually adjusted resistance;  High marching gait forward/backward in // bars x 4 lengths; Side stepping in // bars x 4 lengths; Cross-over stepping in //bars x 4 lengths; Sit to Stand with 8# med ball 2 x 10 (MinA for balance);  Seated chest passes with 8# med ball 2 x 5; Practiced backwards stepping response but significantly impaired righting reactions so discontinued; Feet together horizontal (FT) and vertical head turns x 30s each; Airex FT horizontal and vertical head turns x 30s each; Forward and lateral stepping over 6" hurdles in // bars;  Walking 6" cone taps alternating sides in // bars x multiple lengths;   Not Performed: Airex Static Balance Progression 3 x 10s;   6" Step up with no UE support (CGA) 2 x 20;  12" Step Up with BUE Support 2 x 20; Cross Stepping Left and Right with Boxing Punches x multiple lengths Resisted Forwards and Backwards Walking in // Bars, unsteady with backwards walking;  Seated Long Arc Quads 2 x 10 5# Ankle Weight (AW); Standing Marches with 3# AW 2 x 10; Step Ups with 3# AW 2 x 10; Step Ups with Claps BLE 2 x 10s; Stepping on Airex 2 x 10;  Semi-Tandem Stance Level Ground 3 x 10s ; Semi-Tandem with Reaching task BLE in front 2 x multiple lengths; Sitting with Large Amplitude stomp and clap 2 x 10 BLE Static Stance, Level Surface with  Head Turns (HT) 2  x 15s; Walking in // bars with HT 2 x multiple lengths;  Walking with Weaving through 6 cones x 4;  Walking with PT facilitated Arm Swing in hallway ~421ft; Weaving Through 6 Cones with 6" hurdles x 4;  Weaving through 6 Cones with 12" hurdles x 4; TRX Partial Squats 2 x 10 (CGA, Posterior Sway);  Side Stepping onto Airex pads 1 x 10;  Side Stepping onto Airex pads with 6" hurdle in between 1 x 10;  Nautilus Resisted Gait Forward, Retro, Side stepping 2x ea direction (minA for balance with right side stepping);   PATIENT EDUCATION:  Education details: Plan of Care  Person educated: Patient and Spouse Education method: Explanation Education comprehension: verbalized understanding   HOME EXERCISE PROGRAM:  Access Code: [email or web address] URL: [email or web address] Date: 10/05/2022 Prepared by: Ria Comment  Exercises - Sit to Stand with Counter Support  - 1 x daily - 7 x weekly - 3 sets - 10 reps - Standing March with Counter Support  - 1 x daily - 7 x weekly - 3 sets - 10 reps - Standing Romberg to 1/2 Tandem Stance  - 1 x daily - 7 x weekly - 3 x 30s with each foot forward hold   ASSESSMENT:  CLINICAL IMPRESSION: Patient arrived to physical therapy highly motivated to improve balance. Today's session with a main focus on improving dynamic balance. Patient demonstrated improvements with standing marches and sit to stands with added weight. However, patient required verbal and tactile cues throughout session in order to maintain balance and wide BOS. Poor righting response noted with attempts to challenge his BOS. He will need updated outcome measures and a progress note at next visit. Pt is also agreeable to discharge next week. Pt will continue to benefit from skilled physical therapy to address deficits in strength, balance, and mobility in order to return to full function at home.  OBJECTIVE IMPAIRMENTS: decreased activity tolerance, decreased balance,  decreased coordination, decreased endurance, decreased mobility, difficulty walking, decreased ROM, decreased strength, and impaired sensation.   ACTIVITY LIMITATIONS: standing, squatting, stairs, transfers, and bed mobility  PARTICIPATION LIMITATIONS: laundry, driving, and community activity  PERSONAL FACTORS: Age, Fitness, Past/current experiences, and 3+ comorbidities: Atrial Fibrillation, CAD, Parkinson's,   are also affecting patient's functional outcome.   REHAB POTENTIAL: Good  CLINICAL DECISION MAKING: Evolving/moderate complexity  EVALUATION COMPLEXITY: High   GOALS: Goals reviewed with patient? No  SHORT TERM GOALS: Target date:   Pt will be independent with HEP in order to improve strength and balance in order to decrease fall risk and improve function at home. Baseline:  Goal status: PROGRESSING   LONG TERM GOALS: Target date: 02/07/2023   Pt will increase FOTO to at least 64 to demonstrate significant improvement in function at home related to balance  Baseline: 08/08: 54 09/27/2022: 64 Goal status: ACHIEVED  2.  Pt will improve BERG by at least 3 points in order to demonstrate clinically significant improvement in balance.   Baseline: 08/08: 27/56; 09/27/22: 26/56 Goal status: ONGOING  3. Pt will decrease TUG to below 14 seconds/decrease in order to demonstrate decreased fall risk.  Baseline: 08/06: 21.9; 09/27/22: 8.85, 10/16/22: 8.67 Goal status: ACHIEVED  4. Pt will decrease 5TSTS by at least 3 seconds in order to demonstrate clinically significant improvement in LE strength Baseline: 08/08: 26s; 09/27/22: 16.43s, 10/16/2022: 10.70 (UE Support) Goal status: ACHIEVED  5. Pt will improve ABC by at least 13% in order to demonstrate clinically significant improvement in balance confidence. Baseline: 08/08:  63% 09/27/22: 61.8% Goal status: ONGOING  PLAN: PT FREQUENCY: 2x/week  PT DURATION: 8 weeks  PLANNED INTERVENTIONS: Therapeutic exercises,  Therapeutic activity, Neuromuscular re-education, Balance training, Gait training, Patient/Family education, Self Care, Joint mobilization, Joint manipulation, Vestibular training, Canalith repositioning, Orthotic/Fit training, DME instructions, Dry Needling, Electrical stimulation, Spinal manipulation, Spinal mobilization, Cryotherapy, Moist heat, Taping, Traction, Ultrasound, Ionotophoresis 4mg /ml Dexamethasone, Manual therapy, and Re-evaluation.  PLAN FOR NEXT SESSION:  update additional outcome measures/progress note, continue progressing strength and balance program. Review and modify HEP   Sharalyn Ink Monterrio Gerst PT, DPT, GCS  11/15/2022 1:26 PM

## 2022-11-19 ENCOUNTER — Ambulatory Visit: Payer: Medicare HMO

## 2022-11-19 DIAGNOSIS — R2681 Unsteadiness on feet: Secondary | ICD-10-CM | POA: Diagnosis not present

## 2022-11-19 DIAGNOSIS — G20C Parkinsonism, unspecified: Secondary | ICD-10-CM | POA: Diagnosis not present

## 2022-11-19 DIAGNOSIS — M6281 Muscle weakness (generalized): Secondary | ICD-10-CM

## 2022-11-19 DIAGNOSIS — M21622 Bunionette of left foot: Secondary | ICD-10-CM | POA: Diagnosis not present

## 2022-11-19 NOTE — Therapy (Cosign Needed)
OUTPATIENT PHYSICAL THERAPY BALANCE PROGRESS NOTE  Dates of reporting period  09/27/2022   to  11/19/2022   Patient Name: Daniel [name] MRN: [id number] DOB:[birth year], 82 y.o., male Today's Date: 11/20/2022  END OF SESSION:  PT End of Session - 11/19/22 1103     Visit Number 20    Number of Visits 25    Date for PT Re-Evaluation 11/20/22    Authorization Type eval: 08/28/22    PT Start Time 1101    PT Stop Time 1145    PT Time Calculation (min) 44 min    Equipment Utilized During Treatment Gait belt    Activity Tolerance Patient tolerated treatment well    Behavior During Therapy WFL for tasks assessed/performed            Past Medical History:  Diagnosis Date   AF (atrial fibrillation) (HCC)    B12 deficiency    Coronary artery disease    DH (dermatitis herpetiformis)    Dysphagia    Hypertension    Presence of permanent cardiac pacemaker    2006   Primary parkinsonism (HCC)    Prostate cancer (HCC) 2014   Past Surgical History:  Procedure Laterality Date   APPENDECTOMY     CARDIAC CATHETERIZATION     CATARACT EXTRACTION W/PHACO Left 05/04/2020   Procedure: CATARACT EXTRACTION PHACO AND INTRAOCULAR LENS PLACEMENT (IOC) LEFT  6.76 00:59.9 11.3%;  Surgeon: Lockie Mola, MD;  Location: [facility];  Service: Ophthalmology;  Laterality: Left;   CATARACT EXTRACTION W/PHACO Right 05/18/2020   Procedure: CATARACT EXTRACTION PHACO AND INTRAOCULAR LENS PLACEMENT (IOC) RIGHT 15.56 01:52.6 13.8%;  Surgeon: Lockie Mola, MD;  Location: [facility];  Service: Ophthalmology;  Laterality: Right;   COLONOSCOPY  2008, 2014   CYSTOSCOPY WITH LITHOLAPAXY N/A 09/17/2019   Procedure: CYSTOSCOPY WITH LITHOLAPAXY;  Surgeon: Orson Ape, MD;  Location: [facility];  Service: Urology;  Laterality: N/A;   ESOPHAGOGASTRODUODENOSCOPY (EGD) WITH PROPOFOL N/A 10/13/2015   Procedure: ESOPHAGOGASTRODUODENOSCOPY (EGD) WITH PROPOFOL;  Surgeon: Christena Deem, MD;   Location[facility];  Service: Endoscopy;  Laterality: N/A;   FASCIOTOMY Left 01/30/2019   Procedure: I&D BELOW FASCIA FOOT SINGLE LEFT;  Surgeon: Gwyneth Revels, DPM;  Location: [facility];  Service: Podiatry;  Laterality: Left;   GREEN LIGHT LASER TURP (TRANSURETHRAL RESECTION OF PROSTATE N/A 09/17/2019   Procedure: GREEN LIGHT LASER TURP (TRANSURETHRAL RESECTION OF PROSTATE;  Surgeon: Orson Ape, MD;  Location: [facility];  Service: Urology;  Laterality: N/A;   PACEMAKER PLACEMENT  2006   There are no problems to display for this patient.   PCP: Jerl Mina, MD  REFERRING PROVIDER: Lonell Face, MD   REFERRING DIAG: Primary Parkinsonism   RATIONALE FOR EVALUATION AND TREATMENT: Rehabilitation  THERAPY DIAG: Muscle weakness (generalized)  Unsteadiness on feet  ONSET DATE: 07/15/2022  FOLLOW-UP APPT SCHEDULED WITH REFERRING PROVIDER:  Didn't Address    SUBJECTIVE:  SUBJECTIVE STATEMENT:  Balance Impairment & Parkinson's  PERTINENT HISTORY:  Patient reports he's had a dozen falls within the last year; four occurred since his last visit to ED 07/15/2022. Reports spinning with bending over to perform laundry. He fell in his churches bathroom and hit his head. Patient had a laceration on his left ear which was repaired and his was discharged after further evaluation. Pt diagnosed with Parkinson's Disease. He goes to [employer]. He would like to be able to move without fear of falling & get out of bed without any help.    Pain: No Numbness/Tingling: Yes Numbness present BLE, below knee down to plantar aspect of the foot.  Focal Weakness: No Recent changes in overall health/medication: No Prior history of physical therapy for balance:  No Dominant hand: right  Imaging: Yes, Pt  reports imaging negative for hematoma, hemorrhaging Red flags: Negative for bowel/bladder changes, saddle paresthesia, personal history of cancer, h/o spinal tumors, h/o compression fx, h/o abdominal aneurysm, abdominal pain, chills/fever, night sweats, nausea, vomiting, unrelenting pain, first onset of insidious LBP <20 y/o  PRECAUTIONS: Fall  WEIGHT BEARING RESTRICTIONS: No FALLS: Has patient fallen in last 6 months? Yes. Number of falls 4 , Directional pattern for falls: Yes Backwards, turns and falls.  Living Environment Lives with: lives with their spouse Lives in: House/apartment Stairs: No Has following equipment at home: Single point cane and Environmental consultant - 2 wheeled  Prior level of function: Independent and Independent with household mobility with device  Occupational demands: Retired  Hobbies: TEFL teacher, [employer] 1x/wk  Patient Goals: He would like to be able to move without fear of falling & get out of bed without any help.   OBJECTIVE:   Patient Surveys  FOTO: 31, Predicted to improve 64 (Visit 19) ABC: Deferred  Cognition Patient is oriented to person, place, and time.  Recent memory is intact.  Remote memory is intact.  Attention span and concentration are intact.  Expressive speech is intact, pt speech is slightly muffled/hyphophonic.  Patient's fund of knowledge is within normal limits for educational level.   Gross Musculoskeletal Assessment Tremor: None Bulk: Normal Tone: Normal "Large mass" in posterior upper thoracic back; pt noted no pain to palpation. MD aware  Posture: Mildly kyphotic in thoracic spine  AROM AROM (Normal range in degrees) AROM   Lumbar   Flexion (65)   Extension (30)   Right lateral flexion (25)   Left lateral flexion (25)   Right rotation (30)   Left rotation (30)       Hip Right Left  Flexion (125)    Extension (15)    Abduction (40)    Adduction     Internal Rotation (45)    External Rotation (45)         Knee    Flexion (135)    Extension (0)        Ankle    Dorsiflexion (20)    Plantarflexion (50)    Inversion (35)    Eversion (15)    (* = pain; Blank rows = not tested)  LE MMT: MMT (out of 5) Right  Left   Hip flexion 4+ 4+  Hip extension    Hip abduction    Hip adduction    Hip internal rotation 4 4  Hip external rotation 4 4  Knee flexion 4- 4-  Knee extension 4- 4-  Ankle dorsiflexion 4- 4-  Ankle plantarflexion    Ankle inversion  Ankle eversion    (* = pain; Blank rows = not tested)  Sensation Grossly intact to light touch throughout bilateral LEs as determined by testing dermatomes L2-S2. Proprioception, stereognosis, and hot/cold testing deferred on this date.  Sensation is slightly more Right > Left L4-5, S1   Reflexes Deferred  Cranial Nerves Deferred  Coordination/Cerebellar Finger to Nose: Slight Deviation from finger, more impaired noted on left more than right  Heel to Shin: WNL Rapid alternating movements: WNL Finger Opposition: WNL Pronator Drift: Negative  Bed mobility: Supine to sit CGA, pt reported assistance from spouse to return to seated position  Transfers: Assistive device utilized: Environmental consultant - 2 wheeled  Sit to stand: Min A, MinA for power up phase only  Stand to sit: CGA Chair to chair: Deferred Floor: Deferred  Curb:  Level of Assistance: Deferred Assistive device utilized: Deferred Curb Comments: Deferred  Stairs: Deferred  Gait: Gait pattern: step to pattern, decreased arm swing- Right, decreased arm swing- Left, decreased step length- Right, decreased step length- Left, and decreased stride length Distance walked: 90m Assistive device utilized: Walker - 2 wheeled Level of assistance: Modified independence Comments: Patient presents with decreased step length bilaterally;  Functional Outcome Measures  Results Comments  BERG    DGI    FGA    TUG 21.9 seconds Required verbal cues, assistance for transfer     5TSTS    6 Minute Walk Test    10 Meter Gait Speed Self-selected: 11.3s = 0.88 m/s; With front wheeled walker, slightly below full community ambulation speed  (Blank rows = not tested)  VESTIBULAR TESTING Modified Dix-Hallpike R: negative, L: Negative    TODAY'S TREATMENT   SUBJECTIVE: Patient arrived to physical therapy reporting two falls since the last therapy appointment. Both falls involved dynamic reaching. He denies any significant trauma except for skin tears. Denies head injury. He is still participates in [place] outside of therapy.  PAIN: No resting pain   Neuromuscular Re-ed  NuStep Level 1-6 x 8 min for warm up and interval history, PT manually adjusted resistance;   Updated Outcome Measures, discussed results with patient:  BERG: 33/56 5TSTS: 12.34s TUG: 8.87s ABC: 74%  Side Stepping over 6" and 12" hurdle in // bars x 3 trials;  Forward Marching over 6" hurdle in // bars x 2 trials;  Seated Med [employer] 2 x 8;   Not Performed: Airex Static Balance Progression 3 x 10s;   6" Step up with no UE support (CGA) 2 x 20;  12" Step Up with BUE Support 2 x 20; Cross Stepping Left and Right with Boxing Punches x multiple lengths Resisted Forwards and Backwards Walking in [employer], unsteady with backwards walking;  Seated Long Arc Quads 2 x 10 5# Ankle Weight (AW); Standing Marches with 3# AW 2 x 10; Step Ups with 3# AW 2 x 10; Step Ups with Claps BLE 2 x 10s; Stepping on Airex 2 x 10;  Semi-Tandem Stance Level Ground 3 x 10s ; Semi-Tandem with Reaching task BLE in front 2 x multiple lengths; Sitting with Large Amplitude stomp and clap 2 x 10 BLE Static Stance, Level Surface with Head Turns (HT) 2 x 15s; Walking in // bars with HT 2 x multiple lengths;  Walking with Weaving through 6 cones x 4;  Walking with PT facilitated Arm Swing in hallway ~433ft; Weaving Through 6 Cones with 6" hurdles x 4;  Weaving through 6 Cones with 12" hurdles x 4; TRX Partial Squats  2 x 10 (CGA, Posterior Sway);  Side Stepping onto Airex pads 1 x 10;  Side Stepping onto Airex pads with 6" hurdle in between 1 x 10;  Nautilus Resisted Gait Forward, Retro, Side stepping 2x ea direction (minA for balance with right side stepping);   PATIENT EDUCATION:  Education details: Plan of Care  Person educated: Patient and Spouse Education method: Explanation Education comprehension: verbalized understanding   HOME EXERCISE PROGRAM:  Access Code: [email or web address] URL: [email or web address] Date: 10/05/2022 Prepared by: Ria Comment  Exercises - Sit to Stand with Counter Support  - 1 x daily - 7 x weekly - 3 sets - 10 reps - Standing March with Counter Support  - 1 x daily - 7 x weekly - 3 sets - 10 reps - Standing Romberg to 1/2 Tandem Stance  - 1 x daily - 7 x weekly - 3 x 30s with each foot forward hold   ASSESSMENT:  CLINICAL IMPRESSION: Patient arrived to physical therapy highly motivated to improve balance. Today's session with a main focus on reassessing patient's progress towards goals. Patient's LE strength has improved but dynamic balance tasks requiring narrow BOS or single leg stance remaining challenging 2/2 impaired balance. 5TSTS and TUG remain relatively unchanged since last visit.  Patient's static balance has significantly improved as indicated with BERG results. PT discussed results of outcome measures with the patient. Pt agreeable to discharge in next visit. Pt will continue to benefit from skilled physical therapy to address deficits in strength, balance, and mobility in order to return to full function at home.  OBJECTIVE IMPAIRMENTS: decreased activity tolerance, decreased balance, decreased coordination, decreased endurance, decreased mobility, difficulty walking, decreased ROM, decreased strength, and impaired sensation.   ACTIVITY LIMITATIONS: standing, squatting, stairs, transfers, and bed mobility  PARTICIPATION LIMITATIONS: laundry,  driving, and community activity  PERSONAL FACTORS: Age, Fitness, Past/current experiences, and 3+ comorbidities: Atrial Fibrillation, CAD, Parkinson's,   are also affecting patient's functional outcome.   REHAB POTENTIAL: Good  CLINICAL DECISION MAKING: Evolving/moderate complexity  EVALUATION COMPLEXITY: High   GOALS: Goals reviewed with patient? No  SHORT TERM GOALS: Target date:   Pt will be independent with HEP in order to improve strength and balance in order to decrease fall risk and improve function at home. Baseline:  Goal status: PROGRESSING   LONG TERM GOALS: Target date: 02/12/2023   Pt will increase FOTO to at least 64 to demonstrate significant improvement in function at home related to balance  Baseline: 08/08: 54 09/27/2022: 64 Goal status: ACHIEVED  2.  Pt will improve BERG by at least 3 points in order to demonstrate clinically significant improvement in balance.   Baseline: 08/08: 27/56; 09/27/22: 26/56 11/19/2022: 33/56 Goal status: ONGOING  3. Pt will decrease TUG to below 14 seconds/decrease in order to demonstrate decreased fall risk.  Baseline: 08/06: 21.9; 09/27/22: 8.85, 10/16/22: 8.67; 11/19/2022: 8.87 Goal status: ACHIEVED  4. Pt will decrease 5TSTS by at least 3 seconds in order to demonstrate clinically significant improvement in LE strength Baseline: 08/08: 26s; 09/27/22: 16.43s, 10/16/2022: 10.70 (UE Support); 11/19/2022: 12.34  Goal status: ACHIEVED  5. Pt will improve ABC by at least 13% in order to demonstrate clinically significant improvement in balance confidence. Baseline: 08/08: 63% 09/27/22: 61.8%; 11/19/2022: 74% Goal status: ACHIEVED    PLAN: PT FREQUENCY: 2x/week  PT DURATION: 8 weeks  PLANNED INTERVENTIONS: Therapeutic exercises, Therapeutic activity, Neuromuscular re-education, Balance training, Gait training, Patient/Family education, Self Care, Joint mobilization, Joint manipulation, Vestibular training, Canalith  repositioning, Orthotic/Fit  training, DME instructions, Dry Needling, Electrical stimulation, Spinal manipulation, Spinal mobilization, Cryotherapy, Moist heat, Taping, Traction, Ultrasound, Ionotophoresis 4mg /ml Dexamethasone, Manual therapy, and Re-evaluation.  PLAN FOR NEXT SESSION:  continue progressing strength and balance program. Review and modify HEP   Sharalyn Ink Huprich PT, DPT, GCS  11/20/2022 12:07 PM

## 2022-11-21 ENCOUNTER — Ambulatory Visit: Payer: Medicare HMO

## 2022-11-21 DIAGNOSIS — R2681 Unsteadiness on feet: Secondary | ICD-10-CM | POA: Diagnosis not present

## 2022-11-21 DIAGNOSIS — M6281 Muscle weakness (generalized): Secondary | ICD-10-CM | POA: Diagnosis not present

## 2022-11-21 NOTE — Therapy (Cosign Needed)
OUTPATIENT PHYSICAL THERAPY BALANCE RE-CERTIFICATION/DISCHARGE  Patient Name: Daniel [name] MRN: [id number] DOB:[birth year], 82 y.o., male Today's Date: 11/22/2022  END OF SESSION:  PT End of Session - 11/21/22 1106     Visit Number 21    Number of Visits 26    Date for PT Re-Evaluation 11/28/22    Authorization Type eval: 08/28/22    PT Start Time 1100    PT Stop Time 1140    PT Time Calculation (min) 40 min    Equipment Utilized During Treatment Gait belt    Activity Tolerance Patient tolerated treatment well    Behavior During Therapy WFL for tasks assessed/performed            Past Medical History:  Diagnosis Date   AF (atrial fibrillation) (HCC)    B12 deficiency    Coronary artery disease    DH (dermatitis herpetiformis)    Dysphagia    Hypertension    Presence of permanent cardiac pacemaker    2006   Primary parkinsonism (HCC)    Prostate cancer (HCC) 2014   Past Surgical History:  Procedure Laterality Date   APPENDECTOMY     CARDIAC CATHETERIZATION     CATARACT EXTRACTION W/PHACO Left 05/04/2020   Procedure: CATARACT EXTRACTION PHACO AND INTRAOCULAR LENS PLACEMENT (IOC) LEFT  6.76 00:59.9 11.3%;  Surgeon: Lockie Mola, MD;  Location: [facility];  Service: Ophthalmology;  Laterality: Left;   CATARACT EXTRACTION W/PHACO Right 05/18/2020   Procedure: CATARACT EXTRACTION PHACO AND INTRAOCULAR LENS PLACEMENT (IOC) RIGHT 15.56 01:52.6 13.8%;  Surgeon: Lockie Mola, MD;  Location: [facility];  Service: Ophthalmology;  Laterality: Right;   COLONOSCOPY  2008, 2014   CYSTOSCOPY WITH LITHOLAPAXY N/A 09/17/2019   Procedure: CYSTOSCOPY WITH LITHOLAPAXY;  Surgeon: Orson Ape, MD;  Location: [facility];  Service: Urology;  Laterality: N/A;   ESOPHAGOGASTRODUODENOSCOPY (EGD) WITH PROPOFOL N/A 10/13/2015   Procedure: ESOPHAGOGASTRODUODENOSCOPY (EGD) WITH PROPOFOL;  Surgeon: Christena Deem, MD;  Location: [facility];  Service: Endoscopy;   Laterality: N/A;   FASCIOTOMY Left 01/30/2019   Procedure: I&D BELOW FASCIA FOOT SINGLE LEFT;  Surgeon: Gwyneth Revels, DPM;  Location: [facility];  Service: Podiatry;  Laterality: Left;   GREEN LIGHT LASER TURP (TRANSURETHRAL RESECTION OF PROSTATE N/A 09/17/2019   Procedure: GREEN LIGHT LASER TURP (TRANSURETHRAL RESECTION OF PROSTATE;  Surgeon: Orson Ape, MD;  Location: [facility];  Service: Urology;  Laterality: N/A;   PACEMAKER PLACEMENT  2006   There are no problems to display for this patient.   PCP: Jerl Mina, MD  REFERRING PROVIDER: Lonell Face, MD   REFERRING DIAG: Primary Parkinsonism   RATIONALE FOR EVALUATION AND TREATMENT: Rehabilitation  THERAPY DIAG: Muscle weakness (generalized)  Unsteadiness on feet  ONSET DATE: 07/15/2022  FOLLOW-UP APPT SCHEDULED WITH REFERRING PROVIDER:  Didn't Address    SUBJECTIVE:  SUBJECTIVE STATEMENT:  Balance Impairment & Parkinson's  PERTINENT HISTORY:  Patient reports he's had a dozen falls within the last year; four occurred since his last visit to ED 07/15/2022. Reports spinning with bending over to perform laundry. He fell in his churches bathroom and hit his head. Patient had a laceration on his left ear which was repaired and his was discharged after further evaluation. Pt diagnosed with Parkinson's Disease. He goes to [employer]. He would like to be able to move without fear of falling & get out of bed without any help.    Pain: No Numbness/Tingling: Yes Numbness present BLE, below knee down to plantar aspect of the foot.  Focal Weakness: No Recent changes in overall health/medication: No Prior history of physical therapy for balance:  No Dominant hand: right  Imaging: Yes, Pt reports imaging negative for hematoma,  hemorrhaging Red flags: Negative for bowel/bladder changes, saddle paresthesia, personal history of cancer, h/o spinal tumors, h/o compression fx, h/o abdominal aneurysm, abdominal pain, chills/fever, night sweats, nausea, vomiting, unrelenting pain, first onset of insidious LBP <20 y/o  PRECAUTIONS: Fall  WEIGHT BEARING RESTRICTIONS: No FALLS: Has patient fallen in last 6 months? Yes. Number of falls 4 , Directional pattern for falls: Yes Backwards, turns and falls.  Living Environment Lives with: lives with their spouse Lives in: House/apartment Stairs: No Has following equipment at home: Single point cane and Environmental consultant - 2 wheeled  Prior level of function: Independent and Independent with household mobility with device  Occupational demands: Retired  Hobbies: TEFL teacher, [employer] 1x/wk  Patient Goals: He would like to be able to move without fear of falling & get out of bed without any help.   OBJECTIVE:   Patient Surveys  FOTO: 57, Predicted to improve 64 (Visit 19) ABC: Deferred  Cognition Patient is oriented to person, place, and time.  Recent memory is intact.  Remote memory is intact.  Attention span and concentration are intact.  Expressive speech is intact, pt speech is slightly muffled/hyphophonic.  Patient's fund of knowledge is within normal limits for educational level.   Gross Musculoskeletal Assessment Tremor: None Bulk: Normal Tone: Normal "Large mass" in posterior upper thoracic back; pt noted no pain to palpation. MD aware  Posture: Mildly kyphotic in thoracic spine  AROM AROM (Normal range in degrees) AROM   Lumbar   Flexion (65)   Extension (30)   Right lateral flexion (25)   Left lateral flexion (25)   Right rotation (30)   Left rotation (30)       Hip Right Left  Flexion (125)    Extension (15)    Abduction (40)    Adduction     Internal Rotation (45)    External Rotation (45)        Knee    Flexion (135)    Extension  (0)        Ankle    Dorsiflexion (20)    Plantarflexion (50)    Inversion (35)    Eversion (15)    (* = pain; Blank rows = not tested)  LE MMT: MMT (out of 5) Right  Left   Hip flexion 4+ 4+  Hip extension    Hip abduction    Hip adduction    Hip internal rotation 4 4  Hip external rotation 4 4  Knee flexion 4- 4-  Knee extension 4- 4-  Ankle dorsiflexion 4- 4-  Ankle plantarflexion    Ankle inversion  Ankle eversion    (* = pain; Blank rows = not tested)  Sensation Grossly intact to light touch throughout bilateral LEs as determined by testing dermatomes L2-S2. Proprioception, stereognosis, and hot/cold testing deferred on this date.  Sensation is slightly more Right > Left L4-5, S1   Reflexes Deferred  Cranial Nerves Deferred  Coordination/Cerebellar Finger to Nose: Slight Deviation from finger, more impaired noted on left more than right  Heel to Shin: WNL Rapid alternating movements: WNL Finger Opposition: WNL Pronator Drift: Negative  Bed mobility: Supine to sit CGA, pt reported assistance from spouse to return to seated position  Transfers: Assistive device utilized: Environmental consultant - 2 wheeled  Sit to stand: Min A, MinA for power up phase only  Stand to sit: CGA Chair to chair: Deferred Floor: Deferred  Curb:  Level of Assistance: Deferred Assistive device utilized: Deferred Curb Comments: Deferred  Stairs: Deferred  Gait: Gait pattern: step to pattern, decreased arm swing- Right, decreased arm swing- Left, decreased step length- Right, decreased step length- Left, and decreased stride length Distance walked: 90m Assistive device utilized: Walker - 2 wheeled Level of assistance: Modified independence Comments: Patient presents with decreased step length bilaterally;  Functional Outcome Measures  Results Comments  BERG    DGI    FGA    TUG 21.9 seconds Required verbal cues, assistance for transfer    5TSTS    6 Minute Walk Test    10 Meter  Gait Speed Self-selected: 11.3s = 0.88 m/s; With front wheeled walker, slightly below full community ambulation speed  (Blank rows = not tested)  VESTIBULAR TESTING Modified Dix-Hallpike R: negative, L: Negative    TODAY'S TREATMENT   SUBJECTIVE: Patient arrived to physical therapy with no new reports of falls. He continues to participate in rock steady boxing and performs HEP with spouse.   PAIN: No resting pain   Neuromuscular Re-ed  NuStep Level 1-6 x 8 min for warm up and interval history, PT manually adjusted resistance;   Updated Outcome Measures, discussed results with patient:  FOTO: 64   Bending and Reaching for Cones in // bars x 2 trials;  Side Stepping with BUE Support in // bars x 2 Trials;  Crossover Stepping with BUE support in // bars x 2 trials; Forward Tall Marching with arm swing in // bars x 3 trials;  Forward Gait with wide turns, using RW x 20 ft;  Seating Boxing Punches BUE x 1 min;  Standing Boxing Punches BUE x 1 min;    PATIENT EDUCATION:  Education details: Plan of Care  Person educated: Patient and Spouse Education method: Explanation Education comprehension: verbalized understanding   HOME EXERCISE PROGRAM:  Access Code: [email or web address] URL: [email or web address] Date: 10/05/2022 Prepared by: Ria Comment  Exercises - Sit to Stand with Counter Support  - 1 x daily - 7 x weekly - 3 sets - 10 reps - Standing March with Counter Support  - 1 x daily - 7 x weekly - 3 sets - 10 reps - Standing Romberg to 1/2 Tandem Stance  - 1 x daily - 7 x weekly - 3 x 30s with each foot forward hold   ASSESSMENT:  CLINICAL IMPRESSION: Patient arrived to physical therapy highly motivated to improve balance. Daniel [name] has demonstrated improvements in LE strength and dynamic balance. In his most recent progress note Daniel [name] significantly reduced his 5TSTS and TUG time. Additionally, he has self reported improved balance confidence and has  significantly improved his BERG score. PT  educated and discussed results of outcome measures with patient. He is still limited with functional mobility secondary to impaired  dynamic balance without use of AD; intermittent freezing episodes and high fall risk. Patient strongly encouraged to continue use of RW with household/community ambulation and participation in [employer]. PT provided comprehensive HEP. Patient agreeable to discharge in today's appointment. Patient no longer requires outpatient rehab.   OBJECTIVE IMPAIRMENTS: decreased activity tolerance, decreased balance, decreased coordination, decreased endurance, decreased mobility, difficulty walking, decreased ROM, decreased strength, and impaired sensation.   ACTIVITY LIMITATIONS: standing, squatting, stairs, transfers, and bed mobility  PARTICIPATION LIMITATIONS: laundry, driving, and community activity  PERSONAL FACTORS: Age, Fitness, Past/current experiences, and 3+ comorbidities: Atrial Fibrillation, CAD, Parkinson's,   are also affecting patient's functional outcome.   REHAB POTENTIAL: Good  CLINICAL DECISION MAKING: Evolving/moderate complexity  EVALUATION COMPLEXITY: High   GOALS: Goals reviewed with patient? No  SHORT TERM GOALS: Target date:   Pt will be independent with HEP in order to improve strength and balance in order to decrease fall risk and improve function at home. Baseline:  Goal status: PROGRESSING   LONG TERM GOALS: Target date: 02/14/2023   Pt will increase FOTO to at least 64 to demonstrate significant improvement in function at home related to balance  Baseline: 08/08: 54 09/27/2022: 64; 11/21/2022: 64 Goal status: ACHIEVED  2.  Pt will improve BERG by at least 3 points in order to demonstrate clinically significant improvement in balance.   Baseline: 08/08: 27/56; 09/27/22: 26/56 11/19/2022: 33/56 Goal status: ACHIEVED  3. Pt will decrease TUG to below 14 seconds/decrease in order to  demonstrate decreased fall risk.  Baseline: 08/06: 21.9; 09/27/22: 8.85, 10/16/22: 8.67; 11/19/2022: 8.87 Goal status: ACHIEVED  4. Pt will decrease 5TSTS by at least 3 seconds in order to demonstrate clinically significant improvement in LE strength Baseline: 08/08: 26s; 09/27/22: 16.43s, 10/16/2022: 10.70 (UE Support); 11/19/2022: 12.34  Goal status: ACHIEVED  5. Pt will improve ABC by at least 13% in order to demonstrate clinically significant improvement in balance confidence. Baseline: 08/08: 63% 09/27/22: 61.8%; 11/19/2022: 74% Goal status: ACHIEVED    PLAN: PT FREQUENCY: 1x/week  PT DURATION: 1 weeks  PLANNED INTERVENTIONS: Therapeutic exercises, Therapeutic activity, Neuromuscular re-education, Balance training, Gait training, Patient/Family education, Self Care, Joint mobilization, Joint manipulation, Vestibular training, Canalith repositioning, Orthotic/Fit training, DME instructions, Dry Needling, Electrical stimulation, Spinal manipulation, Spinal mobilization, Cryotherapy, Moist heat, Taping, Traction, Ultrasound, Ionotophoresis 4mg /ml Dexamethasone, Manual therapy, and Re-evaluation.  PLAN FOR NEXT SESSION:  continue progressing strength and balance program. Review and modify HEP  Chris Christyanna Mckeon SPT Sharalyn Ink Huprich PT, DPT, GCS  11/22/2022 3:17 PM

## 2022-12-03 DIAGNOSIS — Q6672 Congenital pes cavus, left foot: Secondary | ICD-10-CM | POA: Diagnosis not present

## 2022-12-03 DIAGNOSIS — G20C Parkinsonism, unspecified: Secondary | ICD-10-CM | POA: Diagnosis not present

## 2022-12-03 DIAGNOSIS — L97521 Non-pressure chronic ulcer of other part of left foot limited to breakdown of skin: Secondary | ICD-10-CM | POA: Diagnosis not present

## 2022-12-17 DIAGNOSIS — L821 Other seborrheic keratosis: Secondary | ICD-10-CM | POA: Diagnosis not present

## 2022-12-17 DIAGNOSIS — Z08 Encounter for follow-up examination after completed treatment for malignant neoplasm: Secondary | ICD-10-CM | POA: Diagnosis not present

## 2022-12-17 DIAGNOSIS — D2262 Melanocytic nevi of left upper limb, including shoulder: Secondary | ICD-10-CM | POA: Diagnosis not present

## 2022-12-17 DIAGNOSIS — L309 Dermatitis, unspecified: Secondary | ICD-10-CM | POA: Diagnosis not present

## 2022-12-17 DIAGNOSIS — L308 Other specified dermatitis: Secondary | ICD-10-CM | POA: Diagnosis not present

## 2022-12-17 DIAGNOSIS — D225 Melanocytic nevi of trunk: Secondary | ICD-10-CM | POA: Diagnosis not present

## 2022-12-17 DIAGNOSIS — Z09 Encounter for follow-up examination after completed treatment for conditions other than malignant neoplasm: Secondary | ICD-10-CM | POA: Diagnosis not present

## 2022-12-17 DIAGNOSIS — Z872 Personal history of diseases of the skin and subcutaneous tissue: Secondary | ICD-10-CM | POA: Diagnosis not present

## 2022-12-17 DIAGNOSIS — Z85828 Personal history of other malignant neoplasm of skin: Secondary | ICD-10-CM | POA: Diagnosis not present

## 2022-12-17 DIAGNOSIS — D2261 Melanocytic nevi of right upper limb, including shoulder: Secondary | ICD-10-CM | POA: Diagnosis not present

## 2022-12-18 DIAGNOSIS — Z125 Encounter for screening for malignant neoplasm of prostate: Secondary | ICD-10-CM | POA: Diagnosis not present

## 2022-12-18 DIAGNOSIS — N1831 Chronic kidney disease, stage 3a: Secondary | ICD-10-CM | POA: Diagnosis not present

## 2022-12-18 DIAGNOSIS — I480 Paroxysmal atrial fibrillation: Secondary | ICD-10-CM | POA: Diagnosis not present

## 2022-12-18 DIAGNOSIS — D631 Anemia in chronic kidney disease: Secondary | ICD-10-CM | POA: Diagnosis not present

## 2022-12-19 DIAGNOSIS — D485 Neoplasm of uncertain behavior of skin: Secondary | ICD-10-CM | POA: Diagnosis not present

## 2022-12-19 DIAGNOSIS — L309 Dermatitis, unspecified: Secondary | ICD-10-CM | POA: Diagnosis not present

## 2022-12-25 DIAGNOSIS — Z1331 Encounter for screening for depression: Secondary | ICD-10-CM | POA: Diagnosis not present

## 2022-12-25 DIAGNOSIS — Z950 Presence of cardiac pacemaker: Secondary | ICD-10-CM | POA: Diagnosis not present

## 2022-12-25 DIAGNOSIS — Z0000 Encounter for general adult medical examination without abnormal findings: Secondary | ICD-10-CM | POA: Diagnosis not present

## 2022-12-25 DIAGNOSIS — I480 Paroxysmal atrial fibrillation: Secondary | ICD-10-CM | POA: Diagnosis not present

## 2022-12-25 DIAGNOSIS — Z23 Encounter for immunization: Secondary | ICD-10-CM | POA: Diagnosis not present

## 2022-12-25 DIAGNOSIS — L97529 Non-pressure chronic ulcer of other part of left foot with unspecified severity: Secondary | ICD-10-CM | POA: Diagnosis not present

## 2022-12-25 DIAGNOSIS — N3944 Nocturnal enuresis: Secondary | ICD-10-CM | POA: Diagnosis not present

## 2022-12-25 DIAGNOSIS — R350 Frequency of micturition: Secondary | ICD-10-CM | POA: Diagnosis not present

## 2022-12-25 DIAGNOSIS — G20C Parkinsonism, unspecified: Secondary | ICD-10-CM | POA: Diagnosis not present

## 2022-12-31 DIAGNOSIS — M2042 Other hammer toe(s) (acquired), left foot: Secondary | ICD-10-CM | POA: Diagnosis not present

## 2022-12-31 DIAGNOSIS — G20C Parkinsonism, unspecified: Secondary | ICD-10-CM | POA: Diagnosis not present

## 2022-12-31 DIAGNOSIS — L97521 Non-pressure chronic ulcer of other part of left foot limited to breakdown of skin: Secondary | ICD-10-CM | POA: Diagnosis not present

## 2023-01-07 DIAGNOSIS — Z950 Presence of cardiac pacemaker: Secondary | ICD-10-CM | POA: Diagnosis not present

## 2023-01-07 DIAGNOSIS — Z45018 Encounter for adjustment and management of other part of cardiac pacemaker: Secondary | ICD-10-CM | POA: Diagnosis not present

## 2023-01-07 DIAGNOSIS — Z9889 Other specified postprocedural states: Secondary | ICD-10-CM | POA: Diagnosis not present

## 2023-01-07 DIAGNOSIS — R0602 Shortness of breath: Secondary | ICD-10-CM | POA: Diagnosis not present

## 2023-01-07 DIAGNOSIS — I480 Paroxysmal atrial fibrillation: Secondary | ICD-10-CM | POA: Diagnosis not present

## 2023-01-07 DIAGNOSIS — G20C Parkinsonism, unspecified: Secondary | ICD-10-CM | POA: Diagnosis not present

## 2023-01-10 DIAGNOSIS — L120 Bullous pemphigoid: Secondary | ICD-10-CM | POA: Diagnosis not present

## 2023-02-04 DIAGNOSIS — H35033 Hypertensive retinopathy, bilateral: Secondary | ICD-10-CM | POA: Diagnosis not present

## 2023-02-04 DIAGNOSIS — H401122 Primary open-angle glaucoma, left eye, moderate stage: Secondary | ICD-10-CM | POA: Diagnosis not present

## 2023-02-04 DIAGNOSIS — H401111 Primary open-angle glaucoma, right eye, mild stage: Secondary | ICD-10-CM | POA: Diagnosis not present

## 2023-02-04 DIAGNOSIS — H52223 Regular astigmatism, bilateral: Secondary | ICD-10-CM | POA: Diagnosis not present

## 2023-02-04 DIAGNOSIS — H5203 Hypermetropia, bilateral: Secondary | ICD-10-CM | POA: Diagnosis not present

## 2023-02-04 DIAGNOSIS — H524 Presbyopia: Secondary | ICD-10-CM | POA: Diagnosis not present

## 2023-02-04 DIAGNOSIS — Z961 Presence of intraocular lens: Secondary | ICD-10-CM | POA: Diagnosis not present

## 2023-02-05 DIAGNOSIS — R0602 Shortness of breath: Secondary | ICD-10-CM | POA: Diagnosis not present

## 2023-02-05 DIAGNOSIS — G2581 Restless legs syndrome: Secondary | ICD-10-CM | POA: Diagnosis not present

## 2023-02-05 DIAGNOSIS — E611 Iron deficiency: Secondary | ICD-10-CM | POA: Diagnosis not present

## 2023-02-05 DIAGNOSIS — G20C Parkinsonism, unspecified: Secondary | ICD-10-CM | POA: Diagnosis not present

## 2023-02-05 DIAGNOSIS — R634 Abnormal weight loss: Secondary | ICD-10-CM | POA: Diagnosis not present

## 2023-02-05 DIAGNOSIS — Z79899 Other long term (current) drug therapy: Secondary | ICD-10-CM | POA: Diagnosis not present

## 2023-02-05 DIAGNOSIS — E569 Vitamin deficiency, unspecified: Secondary | ICD-10-CM | POA: Diagnosis not present

## 2023-02-05 DIAGNOSIS — R296 Repeated falls: Secondary | ICD-10-CM | POA: Diagnosis not present

## 2023-02-08 ENCOUNTER — Encounter: Payer: Self-pay | Admitting: Urology

## 2023-02-08 ENCOUNTER — Ambulatory Visit: Payer: Medicare HMO | Admitting: Urology

## 2023-02-08 VITALS — BP 120/70 | HR 74 | Ht 74.0 in | Wt 151.0 lb

## 2023-02-08 DIAGNOSIS — R399 Unspecified symptoms and signs involving the genitourinary system: Secondary | ICD-10-CM | POA: Diagnosis not present

## 2023-02-08 DIAGNOSIS — R339 Retention of urine, unspecified: Secondary | ICD-10-CM | POA: Diagnosis not present

## 2023-02-08 DIAGNOSIS — C61 Malignant neoplasm of prostate: Secondary | ICD-10-CM

## 2023-02-08 LAB — BLADDER SCAN AMB NON-IMAGING: Scan Result: 292

## 2023-02-08 MED ORDER — SILODOSIN 8 MG PO CAPS: 8.0000 mg | ORAL_CAPSULE | Freq: Every day | ORAL | 0 refills | Status: DC

## 2023-02-08 NOTE — Progress Notes (Signed)
I, Daniel [name], acting as a scribe for Daniel Altes, MD., have documented all relevant documentation on the behalf of Daniel Altes, MD, as directed by Daniel Altes, MD while in the presence of Daniel Altes, MD.  02/08/2023 [name]:38 AM   Daniel [name], Daniel [name] [id number]  Referring provider: Jerl Mina, MD [address] [facility] [place],  [place] [address]  Chief Complaint  Patient presents with   Other    HPI: Daniel [name] is a 83 y.o. male referred for evaluation of prostate cancer.  Previously followed by Dr. Evelene [name] and diagnosed with low-risk prostate cancer in 2013 and has been on active surveillance. Patient has been unable to obtain Dr. Amada [name] previous records. He also had a Green Light PVP in 2013.  Repeat Daniel [name] PVP 2021 for recurrent lower urinary tract symptoms. H&P at the time of his PVP in 2021 was reviewed and his PSA was 5.3, which was stable.  PSA performed by PCP [name]/2024 was 3.31  He has Parkinson's disease and has recently had worsening lower urinary tract symptoms including urinary frequency, urgency, and urge incontinence along with nocturia x3.  Denies gross hematuria or recurrent UTI.   PMH: Past Medical History:  Diagnosis Date   AF (atrial fibrillation) (HCC)    B12 deficiency    Coronary artery disease    DH (dermatitis herpetiformis)    Dysphagia    Hypertension    Presence of permanent cardiac pacemaker    2006   Primary parkinsonism [facility])    Prostate cancer [facility]) 2014    Surgical History: Past Surgical History:  Procedure Laterality Date   APPENDECTOMY     CARDIAC CATHETERIZATION     CATARACT EXTRACTION W/PHACO Left 05/04/2020   Procedure: CATARACT EXTRACTION PHACO AND INTRAOCULAR LENS PLACEMENT (IOC) LEFT  6.76 00:59.9 [name].3%;  Surgeon: Lockie Mola, MD;  Location: [facility];  Service: Ophthalmology;  Laterality: Left;   CATARACT EXTRACTION W/PHACO Right 05/18/2020   Procedure: CATARACT  EXTRACTION PHACO AND INTRAOCULAR LENS PLACEMENT (IOC) RIGHT 15.56 01:52.6 13.8%;  Surgeon: Lockie Mola, MD;  Location: [facility];  Service: Ophthalmology;  Laterality: Right;   COLONOSCOPY  2008, 2014   CYSTOSCOPY WITH LITHOLAPAXY N/A 09/17/2019   Procedure: CYSTOSCOPY WITH LITHOLAPAXY;  Surgeon: Orson Ape, MD;  Location: [facility];  Service: Urology;  Laterality: N/A;   ESOPHAGOGASTRODUODENOSCOPY (EGD) WITH PROPOFOL N/A 10/13/2015   Procedure: ESOPHAGOGASTRODUODENOSCOPY (EGD) WITH PROPOFOL;  Surgeon: Christena Deem, MD;  Location: [facility] END[facility];  Service: Endoscopy;  Laterality: N/A;   FASCIOTOMY Left 01/30/2019   Procedure: I&D BELOW FASCIA FOOT SINGLE LEFT;  Surgeon: Gwyneth Revels, DPM;  Location: [facility];  Service: Podiatry;  Laterality: Left;   GREEN LIGHT LASER TURP (TRANSURETHRAL RESECTION OF PROSTATE N/A 09/17/2019   Procedure: GREEN LIGHT LASER TURP (TRANSURETHRAL RESECTION OF PROSTATE;  Surgeon: Orson Ape, MD;  Location: [facility];  Service: Urology;  Laterality: N/A;   PACEMAKER PLACEMENT  2006    Home Medications:  Allergies as of 02/08/2023       Reactions   Clindamycin/lincomycin Hives   Doxycycline Rash   Hydrocodone-acetaminophen Rash   Levaquin [levofloxacin] Rash        Medication List        Accurate as of February 08, 2023 [name]:38 AM. If you have any questions, ask your nurse or doctor.          aspirin EC 81 MG tablet Take 81 mg  by mouth at bedtime.   carbidopa-levodopa 25-100 MG tablet Commonly known as: SINEMET IR Take 2 tablets by mouth 3 (three) times daily.   carbidopa-levodopa 50-200 MG tablet Commonly known as: SINEMET CR Take 1 tablet by mouth at bedtime.   cholecalciferol 25 MCG (1000 UNIT) tablet Commonly known as: VITAMIN D3 Take 1,000 Units by mouth daily.   cyanocobalamin 1000 MCG tablet Take 1,000 mcg by mouth daily.   latanoprost 0.005 % ophthalmic solution Commonly known as: XALATAN Place 1 drop  into both eyes at bedtime.   metoprolol succinate 25 MG 24 hr tablet Commonly known as: TOPROL-XL Take 25 mg by mouth every evening.   silodosin 8 MG Caps capsule Commonly known as: RAPAFLO Take 1 capsule (8 mg total) by mouth daily with breakfast.        Allergies:  Allergies  Allergen Reactions   Clindamycin/Lincomycin Hives   Doxycycline Rash   Hydrocodone-Acetaminophen Rash   Levaquin [Levofloxacin] Rash    Family History: Family History  Problem Relation Age of Onset   Heart attack Mother    Heart attack Father     Social History:  reports that he has never smoked. He has never used smokeless tobacco. He reports current alcohol use. He reports that he does not use drugs.   Physical Exam: BP 120/70   Pulse 74   Ht 6\' 2"  (1.88 m)   Wt 151 lb (68.5 kg)   BMI 19.39 kg/m   Constitutional:  Alert and oriented, No acute distress. HEENT: [place] AT Respiratory: Normal respiratory effort, no increased work of breathing. Psychiatric: Normal mood and affect.  Assessment & Plan:    1. Lower urinary tract symptoms PVR was 292 mL Will start silodosin 8 mg daily, and follow up in 1 month for symptom check and repeat PVR. If he is still having bothersome lower urinary tract symptoms on follow-up will add Gemtesa and schedule cystoscopy.   2. Low-risk prostate cancer Active surveillance since 2013 and recent PSA was normal at 3.31   I have reviewed the above documentation for accuracy and completeness, and I agree with the above.   Daniel Altes, MD   [facility] [address], [address] [place], [place] [address] [phone]

## 2023-02-18 DIAGNOSIS — M2042 Other hammer toe(s) (acquired), left foot: Secondary | ICD-10-CM | POA: Diagnosis not present

## 2023-02-18 DIAGNOSIS — Q6672 Congenital pes cavus, left foot: Secondary | ICD-10-CM | POA: Diagnosis not present

## 2023-02-18 DIAGNOSIS — L97521 Non-pressure chronic ulcer of other part of left foot limited to breakdown of skin: Secondary | ICD-10-CM | POA: Diagnosis not present

## 2023-02-18 DIAGNOSIS — G20C Parkinsonism, unspecified: Secondary | ICD-10-CM | POA: Diagnosis not present

## 2023-02-19 DIAGNOSIS — R296 Repeated falls: Secondary | ICD-10-CM | POA: Diagnosis not present

## 2023-02-19 DIAGNOSIS — I4891 Unspecified atrial fibrillation: Secondary | ICD-10-CM | POA: Diagnosis not present

## 2023-02-19 DIAGNOSIS — M899 Disorder of bone, unspecified: Secondary | ICD-10-CM | POA: Diagnosis not present

## 2023-02-19 DIAGNOSIS — R195 Other fecal abnormalities: Secondary | ICD-10-CM | POA: Diagnosis not present

## 2023-02-19 DIAGNOSIS — R634 Abnormal weight loss: Secondary | ICD-10-CM | POA: Diagnosis not present

## 2023-02-20 DIAGNOSIS — R195 Other fecal abnormalities: Secondary | ICD-10-CM | POA: Diagnosis not present

## 2023-02-26 DIAGNOSIS — Z8719 Personal history of other diseases of the digestive system: Secondary | ICD-10-CM | POA: Diagnosis not present

## 2023-02-26 DIAGNOSIS — R634 Abnormal weight loss: Secondary | ICD-10-CM | POA: Diagnosis not present

## 2023-02-26 DIAGNOSIS — Z860100 Personal history of colon polyps, unspecified: Secondary | ICD-10-CM | POA: Diagnosis not present

## 2023-02-26 DIAGNOSIS — R195 Other fecal abnormalities: Secondary | ICD-10-CM | POA: Diagnosis not present

## 2023-02-28 DIAGNOSIS — R634 Abnormal weight loss: Secondary | ICD-10-CM | POA: Diagnosis not present

## 2023-02-28 DIAGNOSIS — R195 Other fecal abnormalities: Secondary | ICD-10-CM | POA: Diagnosis not present

## 2023-02-28 DIAGNOSIS — Z8719 Personal history of other diseases of the digestive system: Secondary | ICD-10-CM | POA: Diagnosis not present

## 2023-02-28 DIAGNOSIS — K5989 Other specified functional intestinal disorders: Secondary | ICD-10-CM | POA: Diagnosis not present

## 2023-03-11 ENCOUNTER — Ambulatory Visit: Payer: Medicare HMO | Admitting: Urology

## 2023-03-11 DIAGNOSIS — G20C Parkinsonism, unspecified: Secondary | ICD-10-CM | POA: Diagnosis not present

## 2023-03-11 DIAGNOSIS — L97521 Non-pressure chronic ulcer of other part of left foot limited to breakdown of skin: Secondary | ICD-10-CM | POA: Diagnosis not present

## 2023-03-13 ENCOUNTER — Ambulatory Visit: Payer: Self-pay | Admitting: Urology

## 2023-03-16 ENCOUNTER — Other Ambulatory Visit: Payer: Self-pay

## 2023-03-16 ENCOUNTER — Emergency Department: Payer: Medicare HMO

## 2023-03-16 ENCOUNTER — Emergency Department
Admission: EM | Admit: 2023-03-16 | Discharge: 2023-03-16 | Disposition: A | Discharge status: Home / Self Care | Payer: Medicare HMO | Attending: Emergency Medicine | Admitting: Emergency Medicine

## 2023-03-16 DIAGNOSIS — L89151 Pressure ulcer of sacral region, stage 1: Secondary | ICD-10-CM | POA: Diagnosis not present

## 2023-03-16 DIAGNOSIS — S2242XA Multiple fractures of ribs, left side, initial encounter for closed fracture: Secondary | ICD-10-CM | POA: Diagnosis not present

## 2023-03-16 DIAGNOSIS — S065X0A Traumatic subdural hemorrhage without loss of consciousness, initial encounter: Secondary | ICD-10-CM | POA: Diagnosis not present

## 2023-03-16 DIAGNOSIS — Z950 Presence of cardiac pacemaker: Secondary | ICD-10-CM | POA: Diagnosis not present

## 2023-03-16 DIAGNOSIS — S3993XA Unspecified injury of pelvis, initial encounter: Secondary | ICD-10-CM | POA: Diagnosis not present

## 2023-03-16 DIAGNOSIS — R1012 Left upper quadrant pain: Secondary | ICD-10-CM | POA: Insufficient documentation

## 2023-03-16 DIAGNOSIS — S199XXA Unspecified injury of neck, initial encounter: Secondary | ICD-10-CM | POA: Diagnosis not present

## 2023-03-16 DIAGNOSIS — G20C Parkinsonism, unspecified: Secondary | ICD-10-CM | POA: Diagnosis not present

## 2023-03-16 DIAGNOSIS — S24109A Unspecified injury at unspecified level of thoracic spinal cord, initial encounter: Secondary | ICD-10-CM | POA: Diagnosis not present

## 2023-03-16 DIAGNOSIS — J942 Hemothorax: Secondary | ICD-10-CM

## 2023-03-16 DIAGNOSIS — S272XXA Traumatic hemopneumothorax, initial encounter: Secondary | ICD-10-CM | POA: Insufficient documentation

## 2023-03-16 DIAGNOSIS — S299XXA Unspecified injury of thorax, initial encounter: Secondary | ICD-10-CM | POA: Diagnosis not present

## 2023-03-16 DIAGNOSIS — S2232XD Fracture of one rib, left side, subsequent encounter for fracture with routine healing: Secondary | ICD-10-CM | POA: Diagnosis not present

## 2023-03-16 DIAGNOSIS — W01198A Fall on same level from slipping, tripping and stumbling with subsequent striking against other object, initial encounter: Secondary | ICD-10-CM | POA: Insufficient documentation

## 2023-03-16 DIAGNOSIS — S27321A Contusion of lung, unilateral, initial encounter: Secondary | ICD-10-CM | POA: Diagnosis not present

## 2023-03-16 DIAGNOSIS — S2242XD Multiple fractures of ribs, left side, subsequent encounter for fracture with routine healing: Secondary | ICD-10-CM | POA: Diagnosis not present

## 2023-03-16 DIAGNOSIS — S2243XA Multiple fractures of ribs, bilateral, initial encounter for closed fracture: Secondary | ICD-10-CM | POA: Diagnosis not present

## 2023-03-16 DIAGNOSIS — W1830XA Fall on same level, unspecified, initial encounter: Secondary | ICD-10-CM | POA: Diagnosis not present

## 2023-03-16 DIAGNOSIS — S3991XA Unspecified injury of abdomen, initial encounter: Secondary | ICD-10-CM | POA: Diagnosis not present

## 2023-03-16 DIAGNOSIS — M6281 Muscle weakness (generalized): Secondary | ICD-10-CM | POA: Diagnosis not present

## 2023-03-16 DIAGNOSIS — Y92002 Bathroom of unspecified non-institutional (private) residence single-family (private) house as the place of occurrence of the external cause: Secondary | ICD-10-CM | POA: Insufficient documentation

## 2023-03-16 DIAGNOSIS — S29002A Unspecified injury of muscle and tendon of back wall of thorax, initial encounter: Secondary | ICD-10-CM | POA: Diagnosis not present

## 2023-03-16 DIAGNOSIS — G479 Sleep disorder, unspecified: Secondary | ICD-10-CM | POA: Diagnosis not present

## 2023-03-16 DIAGNOSIS — S065XAA Traumatic subdural hemorrhage with loss of consciousness status unknown, initial encounter: Secondary | ICD-10-CM

## 2023-03-16 DIAGNOSIS — F05 Delirium due to known physiological condition: Secondary | ICD-10-CM | POA: Diagnosis not present

## 2023-03-16 DIAGNOSIS — S14109A Unspecified injury at unspecified level of cervical spinal cord, initial encounter: Secondary | ICD-10-CM | POA: Diagnosis not present

## 2023-03-16 DIAGNOSIS — I2510 Atherosclerotic heart disease of native coronary artery without angina pectoris: Secondary | ICD-10-CM | POA: Diagnosis not present

## 2023-03-16 DIAGNOSIS — S3992XA Unspecified injury of lower back, initial encounter: Secondary | ICD-10-CM | POA: Diagnosis not present

## 2023-03-16 DIAGNOSIS — S27331A Laceration of lung, unilateral, initial encounter: Secondary | ICD-10-CM | POA: Diagnosis not present

## 2023-03-16 DIAGNOSIS — N400 Enlarged prostate without lower urinary tract symptoms: Secondary | ICD-10-CM | POA: Diagnosis not present

## 2023-03-16 DIAGNOSIS — R279 Unspecified lack of coordination: Secondary | ICD-10-CM | POA: Diagnosis not present

## 2023-03-16 DIAGNOSIS — I6202 Nontraumatic subacute subdural hemorrhage: Secondary | ICD-10-CM | POA: Diagnosis not present

## 2023-03-16 DIAGNOSIS — S0990XA Unspecified injury of head, initial encounter: Secondary | ICD-10-CM | POA: Diagnosis not present

## 2023-03-16 DIAGNOSIS — R5381 Other malaise: Secondary | ICD-10-CM | POA: Diagnosis not present

## 2023-03-16 DIAGNOSIS — G20A1 Parkinson's disease without dyskinesia, without mention of fluctuations: Secondary | ICD-10-CM | POA: Diagnosis not present

## 2023-03-16 DIAGNOSIS — I4891 Unspecified atrial fibrillation: Secondary | ICD-10-CM | POA: Diagnosis not present

## 2023-03-16 DIAGNOSIS — I10 Essential (primary) hypertension: Secondary | ICD-10-CM | POA: Diagnosis not present

## 2023-03-16 DIAGNOSIS — J939 Pneumothorax, unspecified: Secondary | ICD-10-CM | POA: Diagnosis not present

## 2023-03-16 LAB — CBC WITH DIFFERENTIAL/PLATELET
Abs Immature Granulocytes: 0.06 10*3/uL (ref 0.00–0.07)
Basophils Absolute: 0.1 10*3/uL (ref 0.0–0.1)
Basophils Relative: 1 %
Eosinophils Absolute: 0.1 10*3/uL (ref 0.0–0.5)
Eosinophils Relative: 0 %
HCT: 36.9 % — ABNORMAL LOW (ref 39.0–52.0)
Hemoglobin: 11.7 g/dL — ABNORMAL LOW (ref 13.0–17.0)
Immature Granulocytes: 0 %
Lymphocytes Relative: 5 %
Lymphs Abs: 0.7 10*3/uL (ref 0.7–4.0)
MCH: 27 pg (ref 26.0–34.0)
MCHC: 31.7 g/dL (ref 30.0–36.0)
MCV: 85.2 fL (ref 80.0–100.0)
Monocytes Absolute: 0.9 10*3/uL (ref 0.1–1.0)
Monocytes Relative: 6 %
Neutro Abs: 13 10*3/uL — ABNORMAL HIGH (ref 1.7–7.7)
Neutrophils Relative %: 88 %
Platelets: 319 10*3/uL (ref 150–400)
RBC: 4.33 MIL/uL (ref 4.22–5.81)
RDW: 16.9 % — ABNORMAL HIGH (ref 11.5–15.5)
WBC: 14.8 10*3/uL — ABNORMAL HIGH (ref 4.0–10.5)
nRBC: 0 % (ref 0.0–0.2)

## 2023-03-16 LAB — COMPREHENSIVE METABOLIC PANEL WITH GFR
ALT: 6 U/L (ref 0–44)
AST: 31 U/L (ref 15–41)
Albumin: 4 g/dL (ref 3.5–5.0)
Alkaline Phosphatase: 94 U/L (ref 38–126)
Anion gap: 10 (ref 5–15)
BUN: 36 mg/dL — ABNORMAL HIGH (ref 8–23)
CO2: 23 mmol/L (ref 22–32)
Calcium: 9.3 mg/dL (ref 8.9–10.3)
Chloride: 105 mmol/L (ref 98–111)
Creatinine, Ser: 0.95 mg/dL (ref 0.61–1.24)
GFR, Estimated: >60 mL/min
Glucose, Bld: 160 mg/dL — ABNORMAL HIGH (ref 70–99)
Potassium: 4 mmol/L (ref 3.5–5.1)
Sodium: 138 mmol/L (ref 135–145)
Total Bilirubin: 0.9 mg/dL (ref 0.0–1.2)
Total Protein: 7.3 g/dL (ref 6.5–8.1)

## 2023-03-16 LAB — PROTIME-INR
INR: 1.2 (ref 0.8–1.2)
Prothrombin Time: 15 s (ref 11.4–15.2)

## 2023-03-16 MED ORDER — FENTANYL CITRATE PF 50 MCG/ML IJ SOSY
50.0000 ug | PREFILLED_SYRINGE | Freq: Once | INTRAMUSCULAR | Status: AC
  Administered 2023-03-16: 50 ug via INTRAVENOUS
  Filled 2023-03-16: qty 1

## 2023-03-16 MED ORDER — HYDROMORPHONE HCL 1 MG/ML IJ SOLN
0.5000 mg | Freq: Once | INTRAMUSCULAR | Status: AC
  Administered 2023-03-16: 0.5 mg via INTRAVENOUS
  Filled 2023-03-16: qty 0.5

## 2023-03-16 MED ORDER — IOHEXOL 300 MG/ML  SOLN
100.0000 mL | Freq: Once | INTRAMUSCULAR | Status: AC | PRN
  Administered 2023-03-16: 100 mL via INTRAVENOUS

## 2023-03-16 NOTE — ED Triage Notes (Signed)
 Pt reports mechanical fall, hitting his head and left ribcage on the bathtub. No LOC, No thinners

## 2023-03-16 NOTE — ED Notes (Signed)
 Powershared all images/ Faxed Facesheet called [facility] spoke with Rep. Carissa gave her brief information about pt / transferred to Dr. Rosalia Hammers for further consult.

## 2023-03-16 NOTE — ED Notes (Signed)
 Emtala reviewed by this RN, consent to transfer signed. Pt transported via helicopter to [facility].

## 2023-03-16 NOTE — ED Notes (Signed)
 Called [facility] spoke with Rep. Carissa. Pt will go ED to ED as [facility], Accepting: Dr. Lavonia Drafts , number for report : [phone] option 2, Address [address] [place], Transfer coordinator is Cloria Spring [phone] I will reach out to [facility] for transportation.

## 2023-03-16 NOTE — ED Notes (Signed)
 Called [facility] [employer] with Rep. Kennon Rounds for transport, Rep stated she has put the pt. on the list to be picked up.

## 2023-03-17 NOTE — ED Provider Notes (Signed)
 [facility] Provider Note    Event Date/Time   First MD Initiated Contact with Patient 03/16/23 1815     (approximate)   History   Fall   HPI  Daniel [name] is a 83 year old male with history of Parkinson's disease, A-fib not on anticoagulation presenting to the emergency department for evaluation after a fall.  Patient reports baseline balance issues, he was in the bathroom at home when he slipped and fell hitting his head and left side of his rib cage.  Denies LOC.  Denies anticoagulation.      Physical Exam   Triage Vital Signs: ED Triage Vitals [03/16/23 1629]  Encounter Vitals Group     BP 128/85     Systolic BP Percentile      Diastolic BP Percentile      Pulse Rate 100     Resp 16     Temp 97.6 F (36.4 C)     Temp Source Oral     SpO2 100 %     Weight 160 lb (72.6 kg)     Height 6\' 2"  (1.88 m)     Head Circumference      Peak Flow      Pain Score 10     Pain Loc      Pain Education      Exclude from Growth Chart     Most recent vital signs: Vitals:   03/16/23 2056 03/16/23 2058  BP: 110/68 110/68  Pulse: (!) 104 (!) 104  Resp: 19 19  Temp: 97.7 F (36.5 C) 97.7 F (36.5 C)  SpO2: 97% 100%    Nursing notes and vital signs reviewed.  General: Adult male, laying in bed, awake and interactive Head: No visible external trauma of the head Chest: Symmetric chest rise, tender to palpation of the lateral chest into the posterior chest Cardiac: Regular rhythm and rate.  Respiratory: Lungs clear to auscultation Abdomen: Soft, nondistended.  Mild tenderness over the left upper quadrant, remainder of abdomen nontender Pelvis: Stable in AP and lateral compression. No tenderness to palpation. MSK: No deformity to bilateral upper and lower extremity. Full range of motion to bilateral upper lower extremity with no pain. Neuro: Alert, oriented. GCS 15. Normal sensation to light touch in bilateral upper and lower extremity. Skin: No  evidence of burns or lacerations.  ED Results / Procedures / Treatments   Labs (all labs ordered are listed, but only abnormal results are displayed) Labs Reviewed  CBC WITH DIFFERENTIAL/PLATELET - Abnormal; Notable for the following components:      Result Value   WBC 14.8 (*)    Hemoglobin 11.7 (*)    HCT 36.9 (*)    RDW 16.9 (*)    Neutro Abs 13.0 (*)    All other components within normal limits  COMPREHENSIVE METABOLIC PANEL - Abnormal; Notable for the following components:   Glucose, Bld 160 (*)    BUN 36 (*)    All other components within normal limits  PROTIME-INR     EKG EKG independently reviewed interpreted by myself (ER attending) demonstrates:  EKG demonstrates A-fib at a rate of 90, QRS 105, QTc 442, no acute ST changes  RADIOLOGY Imaging independently reviewed and interpreted by myself demonstrates:  CT head demonstrates acute to subacute subdural hematoma measuring 1.6 cm with 3 mm of rightward shift CT C-spine without acute fracture CT chest with rib fractures 8 through 12 posteriorly associated pleural fluid and trace pneumothorax   Contrasted  CT chest abdomen pelvis with recons of the spine ordered to further evaluate for injuries redemonstrating above injuries without new trauma  PROCEDURES:  Critical Care performed: Yes, see critical care procedure note(s)  CRITICAL CARE Performed by: Trinna Post   Total critical care time: 32 minutes  Critical care time was exclusive of separately billable procedures and treating other patients.  Critical care was necessary to treat or prevent imminent or life-threatening deterioration.  Critical care was time spent personally by me on the following activities: development of treatment plan with patient and/or surrogate as well as nursing, discussions with consultants, evaluation of patient's response to treatment, examination of patient, obtaining history from patient or surrogate, ordering and performing  treatments and interventions, ordering and review of laboratory studies, ordering and review of radiographic studies, pulse oximetry and re-evaluation of patient's condition.   Procedures   MEDICATIONS ORDERED IN ED: Medications  fentaNYL (SUBLIMAZE) injection 50 mcg (50 mcg Intravenous Given 03/16/23 1928)  iohexol (OMNIPAQUE) 300 MG/ML solution 100 mL (100 mLs Intravenous Contrast Given 03/16/23 1946)  HYDROmorphone (DILAUDID) injection 0.5 mg (0.5 mg Intravenous Given 03/16/23 2049)     IMPRESSION / MDM / ASSESSMENT AND PLAN / ED COURSE  I reviewed the triage vital signs and the nursing notes.  Differential diagnosis includes, but is not limited to, intracranial bleed, skull fracture, rib fracture, pneumothorax, spine fracture  Patient's presentation is most consistent with acute presentation with potential threat to life or bodily function.  83 year old male presenting to the ER for evaluation after a fall.  Imaging notable for subdural hematoma, fortunately without focal neurologic deficits on exam and GCS 15.  Also notable for multiple rib fractures.  Given incentive spirometer, pulling about 500 prior to receiving pain medication.  Discussed in the setting of polytrauma I did recommend transfer to trauma center.  They did request transfer to [facility].  Case was reviewed with Dr. Dorie Rank and patient was accepted in transfer.  Initial plan for ground transport, but [facility] transport team and [facility] both without available trucks or clear ETA for when a truck would be available. After discussion with patient and accepting facility decision was made to transport patient via air. Patient transferred in stable condition.      FINAL CLINICAL IMPRESSION(S) / ED DIAGNOSES   Final diagnoses:  Subdural hematoma (HCC)  Closed fracture of multiple ribs of left side, initial encounter  Hemopneumothorax on left     Rx / DC Orders   ED Discharge Orders     None        Note:  This  document was prepared using Dragon voice recognition software and may include unintentional dictation errors.   Trinna Post, MD 03/17/23 [phone]

## 2023-03-21 DIAGNOSIS — R5381 Other malaise: Secondary | ICD-10-CM | POA: Diagnosis not present

## 2023-03-21 DIAGNOSIS — S065X0D Traumatic subdural hemorrhage without loss of consciousness, subsequent encounter: Secondary | ICD-10-CM | POA: Diagnosis not present

## 2023-03-21 DIAGNOSIS — S065XAA Traumatic subdural hemorrhage with loss of consciousness status unknown, initial encounter: Secondary | ICD-10-CM | POA: Diagnosis not present

## 2023-03-21 DIAGNOSIS — I10 Essential (primary) hypertension: Secondary | ICD-10-CM | POA: Diagnosis not present

## 2023-03-21 DIAGNOSIS — I4891 Unspecified atrial fibrillation: Secondary | ICD-10-CM | POA: Diagnosis not present

## 2023-03-21 DIAGNOSIS — S2242XA Multiple fractures of ribs, left side, initial encounter for closed fracture: Secondary | ICD-10-CM | POA: Diagnosis not present

## 2023-03-21 DIAGNOSIS — S272XXA Traumatic hemopneumothorax, initial encounter: Secondary | ICD-10-CM | POA: Diagnosis not present

## 2023-03-21 DIAGNOSIS — I6200 Nontraumatic subdural hemorrhage, unspecified: Secondary | ICD-10-CM | POA: Diagnosis not present

## 2023-03-21 DIAGNOSIS — S2242XD Multiple fractures of ribs, left side, subsequent encounter for fracture with routine healing: Secondary | ICD-10-CM | POA: Diagnosis not present

## 2023-03-21 DIAGNOSIS — R279 Unspecified lack of coordination: Secondary | ICD-10-CM | POA: Diagnosis not present

## 2023-03-21 DIAGNOSIS — S27321A Contusion of lung, unilateral, initial encounter: Secondary | ICD-10-CM | POA: Diagnosis not present

## 2023-03-21 DIAGNOSIS — M6281 Muscle weakness (generalized): Secondary | ICD-10-CM | POA: Diagnosis not present

## 2023-03-21 DIAGNOSIS — G20A1 Parkinson's disease without dyskinesia, without mention of fluctuations: Secondary | ICD-10-CM | POA: Diagnosis not present

## 2023-03-21 DIAGNOSIS — S27331D Laceration of lung, unilateral, subsequent encounter: Secondary | ICD-10-CM | POA: Diagnosis not present

## 2023-03-22 DIAGNOSIS — S065X0D Traumatic subdural hemorrhage without loss of consciousness, subsequent encounter: Secondary | ICD-10-CM | POA: Diagnosis not present

## 2023-03-26 ENCOUNTER — Ambulatory Visit: Payer: Medicare HMO

## 2023-03-26 DIAGNOSIS — I4891 Unspecified atrial fibrillation: Secondary | ICD-10-CM | POA: Diagnosis not present

## 2023-03-26 DIAGNOSIS — S2242XA Multiple fractures of ribs, left side, initial encounter for closed fracture: Secondary | ICD-10-CM | POA: Diagnosis not present

## 2023-03-26 DIAGNOSIS — S065X0D Traumatic subdural hemorrhage without loss of consciousness, subsequent encounter: Secondary | ICD-10-CM | POA: Diagnosis not present

## 2023-03-28 DIAGNOSIS — S27331D Laceration of lung, unilateral, subsequent encounter: Secondary | ICD-10-CM | POA: Diagnosis not present

## 2023-03-28 DIAGNOSIS — G20A1 Parkinson's disease without dyskinesia, without mention of fluctuations: Secondary | ICD-10-CM | POA: Diagnosis not present

## 2023-03-28 DIAGNOSIS — I6200 Nontraumatic subdural hemorrhage, unspecified: Secondary | ICD-10-CM | POA: Diagnosis not present

## 2023-03-28 DIAGNOSIS — S2242XD Multiple fractures of ribs, left side, subsequent encounter for fracture with routine healing: Secondary | ICD-10-CM | POA: Diagnosis not present

## 2023-04-01 DIAGNOSIS — S2242XD Multiple fractures of ribs, left side, subsequent encounter for fracture with routine healing: Secondary | ICD-10-CM | POA: Diagnosis not present

## 2023-04-01 DIAGNOSIS — I10 Essential (primary) hypertension: Secondary | ICD-10-CM | POA: Diagnosis not present

## 2023-04-01 DIAGNOSIS — I6200 Nontraumatic subdural hemorrhage, unspecified: Secondary | ICD-10-CM | POA: Diagnosis not present

## 2023-04-04 DIAGNOSIS — S2242XD Multiple fractures of ribs, left side, subsequent encounter for fracture with routine healing: Secondary | ICD-10-CM | POA: Diagnosis not present

## 2023-04-04 DIAGNOSIS — S27331D Laceration of lung, unilateral, subsequent encounter: Secondary | ICD-10-CM | POA: Diagnosis not present

## 2023-04-04 DIAGNOSIS — R278 Other lack of coordination: Secondary | ICD-10-CM | POA: Diagnosis not present

## 2023-04-04 DIAGNOSIS — G20A1 Parkinson's disease without dyskinesia, without mention of fluctuations: Secondary | ICD-10-CM | POA: Diagnosis not present

## 2023-04-04 DIAGNOSIS — I6200 Nontraumatic subdural hemorrhage, unspecified: Secondary | ICD-10-CM | POA: Diagnosis not present

## 2023-04-05 DIAGNOSIS — F32A Depression, unspecified: Secondary | ICD-10-CM | POA: Diagnosis not present

## 2023-04-05 DIAGNOSIS — I2510 Atherosclerotic heart disease of native coronary artery without angina pectoris: Secondary | ICD-10-CM | POA: Diagnosis not present

## 2023-04-05 DIAGNOSIS — I10 Essential (primary) hypertension: Secondary | ICD-10-CM | POA: Diagnosis not present

## 2023-04-05 DIAGNOSIS — I4891 Unspecified atrial fibrillation: Secondary | ICD-10-CM | POA: Diagnosis not present

## 2023-04-05 DIAGNOSIS — D649 Anemia, unspecified: Secondary | ICD-10-CM | POA: Diagnosis not present

## 2023-04-05 DIAGNOSIS — S2242XD Multiple fractures of ribs, left side, subsequent encounter for fracture with routine healing: Secondary | ICD-10-CM | POA: Diagnosis not present

## 2023-04-05 DIAGNOSIS — N400 Enlarged prostate without lower urinary tract symptoms: Secondary | ICD-10-CM | POA: Diagnosis not present

## 2023-04-05 DIAGNOSIS — S065X0D Traumatic subdural hemorrhage without loss of consciousness, subsequent encounter: Secondary | ICD-10-CM | POA: Diagnosis not present

## 2023-04-05 DIAGNOSIS — G20A1 Parkinson's disease without dyskinesia, without mention of fluctuations: Secondary | ICD-10-CM | POA: Diagnosis not present

## 2023-04-08 DIAGNOSIS — F32A Depression, unspecified: Secondary | ICD-10-CM | POA: Diagnosis not present

## 2023-04-08 DIAGNOSIS — I10 Essential (primary) hypertension: Secondary | ICD-10-CM | POA: Diagnosis not present

## 2023-04-08 DIAGNOSIS — I2510 Atherosclerotic heart disease of native coronary artery without angina pectoris: Secondary | ICD-10-CM | POA: Diagnosis not present

## 2023-04-08 DIAGNOSIS — I4891 Unspecified atrial fibrillation: Secondary | ICD-10-CM | POA: Diagnosis not present

## 2023-04-08 DIAGNOSIS — D649 Anemia, unspecified: Secondary | ICD-10-CM | POA: Diagnosis not present

## 2023-04-08 DIAGNOSIS — G20A1 Parkinson's disease without dyskinesia, without mention of fluctuations: Secondary | ICD-10-CM | POA: Diagnosis not present

## 2023-04-08 DIAGNOSIS — S2242XD Multiple fractures of ribs, left side, subsequent encounter for fracture with routine healing: Secondary | ICD-10-CM | POA: Diagnosis not present

## 2023-04-08 DIAGNOSIS — S065X0D Traumatic subdural hemorrhage without loss of consciousness, subsequent encounter: Secondary | ICD-10-CM | POA: Diagnosis not present

## 2023-04-08 DIAGNOSIS — N400 Enlarged prostate without lower urinary tract symptoms: Secondary | ICD-10-CM | POA: Diagnosis not present

## 2023-04-10 DIAGNOSIS — I10 Essential (primary) hypertension: Secondary | ICD-10-CM | POA: Diagnosis not present

## 2023-04-10 DIAGNOSIS — I4891 Unspecified atrial fibrillation: Secondary | ICD-10-CM | POA: Diagnosis not present

## 2023-04-10 DIAGNOSIS — S065X0D Traumatic subdural hemorrhage without loss of consciousness, subsequent encounter: Secondary | ICD-10-CM | POA: Diagnosis not present

## 2023-04-10 DIAGNOSIS — D649 Anemia, unspecified: Secondary | ICD-10-CM | POA: Diagnosis not present

## 2023-04-10 DIAGNOSIS — F32A Depression, unspecified: Secondary | ICD-10-CM | POA: Diagnosis not present

## 2023-04-10 DIAGNOSIS — G20A1 Parkinson's disease without dyskinesia, without mention of fluctuations: Secondary | ICD-10-CM | POA: Diagnosis not present

## 2023-04-10 DIAGNOSIS — N400 Enlarged prostate without lower urinary tract symptoms: Secondary | ICD-10-CM | POA: Diagnosis not present

## 2023-04-10 DIAGNOSIS — S2242XD Multiple fractures of ribs, left side, subsequent encounter for fracture with routine healing: Secondary | ICD-10-CM | POA: Diagnosis not present

## 2023-04-10 DIAGNOSIS — I2510 Atherosclerotic heart disease of native coronary artery without angina pectoris: Secondary | ICD-10-CM | POA: Diagnosis not present

## 2023-04-12 DIAGNOSIS — S27339S Laceration of lung, unspecified, sequela: Secondary | ICD-10-CM | POA: Diagnosis not present

## 2023-04-12 DIAGNOSIS — R1310 Dysphagia, unspecified: Secondary | ICD-10-CM | POA: Diagnosis not present

## 2023-04-12 DIAGNOSIS — Z09 Encounter for follow-up examination after completed treatment for conditions other than malignant neoplasm: Secondary | ICD-10-CM | POA: Diagnosis not present

## 2023-04-12 DIAGNOSIS — I10 Essential (primary) hypertension: Secondary | ICD-10-CM | POA: Diagnosis not present

## 2023-04-12 DIAGNOSIS — I4891 Unspecified atrial fibrillation: Secondary | ICD-10-CM | POA: Diagnosis not present

## 2023-04-12 DIAGNOSIS — I2510 Atherosclerotic heart disease of native coronary artery without angina pectoris: Secondary | ICD-10-CM | POA: Diagnosis not present

## 2023-04-12 DIAGNOSIS — F32A Depression, unspecified: Secondary | ICD-10-CM | POA: Diagnosis not present

## 2023-04-12 DIAGNOSIS — R634 Abnormal weight loss: Secondary | ICD-10-CM | POA: Diagnosis not present

## 2023-04-12 DIAGNOSIS — G20A1 Parkinson's disease without dyskinesia, without mention of fluctuations: Secondary | ICD-10-CM | POA: Diagnosis not present

## 2023-04-12 DIAGNOSIS — Z950 Presence of cardiac pacemaker: Secondary | ICD-10-CM | POA: Diagnosis not present

## 2023-04-12 DIAGNOSIS — S2242XD Multiple fractures of ribs, left side, subsequent encounter for fracture with routine healing: Secondary | ICD-10-CM | POA: Diagnosis not present

## 2023-04-12 DIAGNOSIS — W19XXXS Unspecified fall, sequela: Secondary | ICD-10-CM | POA: Diagnosis not present

## 2023-04-12 DIAGNOSIS — S065X0D Traumatic subdural hemorrhage without loss of consciousness, subsequent encounter: Secondary | ICD-10-CM | POA: Diagnosis not present

## 2023-04-12 DIAGNOSIS — R799 Abnormal finding of blood chemistry, unspecified: Secondary | ICD-10-CM | POA: Diagnosis not present

## 2023-04-15 DIAGNOSIS — B351 Tinea unguium: Secondary | ICD-10-CM | POA: Diagnosis not present

## 2023-04-15 DIAGNOSIS — M79674 Pain in right toe(s): Secondary | ICD-10-CM | POA: Diagnosis not present

## 2023-04-15 DIAGNOSIS — L97521 Non-pressure chronic ulcer of other part of left foot limited to breakdown of skin: Secondary | ICD-10-CM | POA: Diagnosis not present

## 2023-04-15 DIAGNOSIS — G20C Parkinsonism, unspecified: Secondary | ICD-10-CM | POA: Diagnosis not present

## 2023-04-15 DIAGNOSIS — M2042 Other hammer toe(s) (acquired), left foot: Secondary | ICD-10-CM | POA: Diagnosis not present

## 2023-04-15 DIAGNOSIS — M79675 Pain in left toe(s): Secondary | ICD-10-CM | POA: Diagnosis not present

## 2023-04-16 DIAGNOSIS — I2510 Atherosclerotic heart disease of native coronary artery without angina pectoris: Secondary | ICD-10-CM | POA: Diagnosis not present

## 2023-04-16 DIAGNOSIS — S065X0D Traumatic subdural hemorrhage without loss of consciousness, subsequent encounter: Secondary | ICD-10-CM | POA: Diagnosis not present

## 2023-04-16 DIAGNOSIS — F32A Depression, unspecified: Secondary | ICD-10-CM | POA: Diagnosis not present

## 2023-04-16 DIAGNOSIS — G20A1 Parkinson's disease without dyskinesia, without mention of fluctuations: Secondary | ICD-10-CM | POA: Diagnosis not present

## 2023-04-16 DIAGNOSIS — D649 Anemia, unspecified: Secondary | ICD-10-CM | POA: Diagnosis not present

## 2023-04-16 DIAGNOSIS — I10 Essential (primary) hypertension: Secondary | ICD-10-CM | POA: Diagnosis not present

## 2023-04-16 DIAGNOSIS — I4891 Unspecified atrial fibrillation: Secondary | ICD-10-CM | POA: Diagnosis not present

## 2023-04-16 DIAGNOSIS — S2242XD Multiple fractures of ribs, left side, subsequent encounter for fracture with routine healing: Secondary | ICD-10-CM | POA: Diagnosis not present

## 2023-04-16 DIAGNOSIS — N400 Enlarged prostate without lower urinary tract symptoms: Secondary | ICD-10-CM | POA: Diagnosis not present

## 2023-04-17 DIAGNOSIS — G20A1 Parkinson's disease without dyskinesia, without mention of fluctuations: Secondary | ICD-10-CM | POA: Diagnosis not present

## 2023-04-17 DIAGNOSIS — S2242XD Multiple fractures of ribs, left side, subsequent encounter for fracture with routine healing: Secondary | ICD-10-CM | POA: Diagnosis not present

## 2023-04-17 DIAGNOSIS — I10 Essential (primary) hypertension: Secondary | ICD-10-CM | POA: Diagnosis not present

## 2023-04-17 DIAGNOSIS — N400 Enlarged prostate without lower urinary tract symptoms: Secondary | ICD-10-CM | POA: Diagnosis not present

## 2023-04-17 DIAGNOSIS — S065X0D Traumatic subdural hemorrhage without loss of consciousness, subsequent encounter: Secondary | ICD-10-CM | POA: Diagnosis not present

## 2023-04-17 DIAGNOSIS — D649 Anemia, unspecified: Secondary | ICD-10-CM | POA: Diagnosis not present

## 2023-04-17 DIAGNOSIS — I4891 Unspecified atrial fibrillation: Secondary | ICD-10-CM | POA: Diagnosis not present

## 2023-04-17 DIAGNOSIS — F32A Depression, unspecified: Secondary | ICD-10-CM | POA: Diagnosis not present

## 2023-04-17 DIAGNOSIS — I2510 Atherosclerotic heart disease of native coronary artery without angina pectoris: Secondary | ICD-10-CM | POA: Diagnosis not present

## 2023-04-18 DIAGNOSIS — I10 Essential (primary) hypertension: Secondary | ICD-10-CM | POA: Diagnosis not present

## 2023-04-18 DIAGNOSIS — S2242XD Multiple fractures of ribs, left side, subsequent encounter for fracture with routine healing: Secondary | ICD-10-CM | POA: Diagnosis not present

## 2023-04-18 DIAGNOSIS — S065X0D Traumatic subdural hemorrhage without loss of consciousness, subsequent encounter: Secondary | ICD-10-CM | POA: Diagnosis not present

## 2023-04-18 DIAGNOSIS — I4891 Unspecified atrial fibrillation: Secondary | ICD-10-CM | POA: Diagnosis not present

## 2023-04-18 DIAGNOSIS — N400 Enlarged prostate without lower urinary tract symptoms: Secondary | ICD-10-CM | POA: Diagnosis not present

## 2023-04-18 DIAGNOSIS — G20A1 Parkinson's disease without dyskinesia, without mention of fluctuations: Secondary | ICD-10-CM | POA: Diagnosis not present

## 2023-04-18 DIAGNOSIS — D649 Anemia, unspecified: Secondary | ICD-10-CM | POA: Diagnosis not present

## 2023-04-18 DIAGNOSIS — F32A Depression, unspecified: Secondary | ICD-10-CM | POA: Diagnosis not present

## 2023-04-18 DIAGNOSIS — I2510 Atherosclerotic heart disease of native coronary artery without angina pectoris: Secondary | ICD-10-CM | POA: Diagnosis not present

## 2023-04-22 ENCOUNTER — Inpatient Hospital Stay: Admitting: Certified Registered"

## 2023-04-22 ENCOUNTER — Encounter
Admission: EM | Disposition: A | Discharge status: Skilled Nursing Facility | Payer: Self-pay | Source: Home / Self Care | Attending: Osteopathic Medicine

## 2023-04-22 ENCOUNTER — Inpatient Hospital Stay

## 2023-04-22 ENCOUNTER — Other Ambulatory Visit: Payer: Self-pay

## 2023-04-22 ENCOUNTER — Inpatient Hospital Stay
Admission: EM | Admit: 2023-04-22 | Discharge: 2023-04-26 | DRG: 522 | Disposition: A | Discharge status: Skilled Nursing Facility | Attending: Osteopathic Medicine | Admitting: Osteopathic Medicine

## 2023-04-22 ENCOUNTER — Emergency Department

## 2023-04-22 DIAGNOSIS — S72009A Fracture of unspecified part of neck of unspecified femur, initial encounter for closed fracture: Secondary | ICD-10-CM | POA: Diagnosis present

## 2023-04-22 DIAGNOSIS — Z950 Presence of cardiac pacemaker: Secondary | ICD-10-CM

## 2023-04-22 DIAGNOSIS — Z8249 Family history of ischemic heart disease and other diseases of the circulatory system: Secondary | ICD-10-CM

## 2023-04-22 DIAGNOSIS — Z743 Need for continuous supervision: Secondary | ICD-10-CM | POA: Diagnosis not present

## 2023-04-22 DIAGNOSIS — M25552 Pain in left hip: Secondary | ICD-10-CM | POA: Diagnosis not present

## 2023-04-22 DIAGNOSIS — Z9079 Acquired absence of other genital organ(s): Secondary | ICD-10-CM

## 2023-04-22 DIAGNOSIS — I10 Essential (primary) hypertension: Secondary | ICD-10-CM | POA: Diagnosis present

## 2023-04-22 DIAGNOSIS — Z87820 Personal history of traumatic brain injury: Secondary | ICD-10-CM

## 2023-04-22 DIAGNOSIS — Z881 Allergy status to other antibiotic agents status: Secondary | ICD-10-CM | POA: Diagnosis not present

## 2023-04-22 DIAGNOSIS — I959 Hypotension, unspecified: Secondary | ICD-10-CM | POA: Diagnosis not present

## 2023-04-22 DIAGNOSIS — F0280 Dementia in other diseases classified elsewhere without behavioral disturbance: Secondary | ICD-10-CM | POA: Diagnosis not present

## 2023-04-22 DIAGNOSIS — I2510 Atherosclerotic heart disease of native coronary artery without angina pectoris: Secondary | ICD-10-CM | POA: Diagnosis present

## 2023-04-22 DIAGNOSIS — W010XXA Fall on same level from slipping, tripping and stumbling without subsequent striking against object, initial encounter: Secondary | ICD-10-CM | POA: Diagnosis present

## 2023-04-22 DIAGNOSIS — W19XXXA Unspecified fall, initial encounter: Secondary | ICD-10-CM | POA: Diagnosis not present

## 2023-04-22 DIAGNOSIS — Z96642 Presence of left artificial hip joint: Secondary | ICD-10-CM | POA: Diagnosis not present

## 2023-04-22 DIAGNOSIS — Z8546 Personal history of malignant neoplasm of prostate: Secondary | ICD-10-CM

## 2023-04-22 DIAGNOSIS — Z9181 History of falling: Secondary | ICD-10-CM

## 2023-04-22 DIAGNOSIS — Z885 Allergy status to narcotic agent status: Secondary | ICD-10-CM

## 2023-04-22 DIAGNOSIS — S72002D Fracture of unspecified part of neck of left femur, subsequent encounter for closed fracture with routine healing: Secondary | ICD-10-CM | POA: Diagnosis not present

## 2023-04-22 DIAGNOSIS — I4891 Unspecified atrial fibrillation: Secondary | ICD-10-CM | POA: Insufficient documentation

## 2023-04-22 DIAGNOSIS — R0989 Other specified symptoms and signs involving the circulatory and respiratory systems: Secondary | ICD-10-CM | POA: Diagnosis not present

## 2023-04-22 DIAGNOSIS — Z79899 Other long term (current) drug therapy: Secondary | ICD-10-CM | POA: Diagnosis not present

## 2023-04-22 DIAGNOSIS — I4821 Permanent atrial fibrillation: Secondary | ICD-10-CM | POA: Diagnosis not present

## 2023-04-22 DIAGNOSIS — Y92019 Unspecified place in single-family (private) house as the place of occurrence of the external cause: Secondary | ICD-10-CM

## 2023-04-22 DIAGNOSIS — Z7901 Long term (current) use of anticoagulants: Secondary | ICD-10-CM

## 2023-04-22 DIAGNOSIS — G20A1 Parkinson's disease without dyskinesia, without mention of fluctuations: Secondary | ICD-10-CM | POA: Insufficient documentation

## 2023-04-22 DIAGNOSIS — S72002A Fracture of unspecified part of neck of left femur, initial encounter for closed fracture: Secondary | ICD-10-CM | POA: Diagnosis not present

## 2023-04-22 DIAGNOSIS — R000 Tachycardia, unspecified: Secondary | ICD-10-CM | POA: Diagnosis not present

## 2023-04-22 DIAGNOSIS — E538 Deficiency of other specified B group vitamins: Secondary | ICD-10-CM | POA: Diagnosis not present

## 2023-04-22 DIAGNOSIS — R296 Repeated falls: Secondary | ICD-10-CM | POA: Diagnosis present

## 2023-04-22 DIAGNOSIS — S72032A Displaced midcervical fracture of left femur, initial encounter for closed fracture: Secondary | ICD-10-CM | POA: Diagnosis not present

## 2023-04-22 DIAGNOSIS — Z471 Aftercare following joint replacement surgery: Secondary | ICD-10-CM | POA: Diagnosis not present

## 2023-04-22 DIAGNOSIS — Z01810 Encounter for preprocedural cardiovascular examination: Secondary | ICD-10-CM | POA: Diagnosis not present

## 2023-04-22 DIAGNOSIS — M7989 Other specified soft tissue disorders: Secondary | ICD-10-CM | POA: Diagnosis not present

## 2023-04-22 DIAGNOSIS — S2242XA Multiple fractures of ribs, left side, initial encounter for closed fracture: Secondary | ICD-10-CM | POA: Diagnosis not present

## 2023-04-22 DIAGNOSIS — M8580 Other specified disorders of bone density and structure, unspecified site: Secondary | ICD-10-CM | POA: Diagnosis not present

## 2023-04-22 HISTORY — PX: HIP ARTHROPLASTY: SHX981

## 2023-04-22 LAB — BASIC METABOLIC PANEL WITH GFR
Anion gap: 11 (ref 5–15)
BUN: 36 mg/dL — ABNORMAL HIGH (ref 8–23)
CO2: 25 mmol/L (ref 22–32)
Calcium: 9.7 mg/dL (ref 8.9–10.3)
Chloride: 103 mmol/L (ref 98–111)
Creatinine, Ser: 1.15 mg/dL (ref 0.61–1.24)
GFR, Estimated: >60 mL/min (ref >60)
Glucose, Bld: 101 mg/dL — ABNORMAL HIGH (ref 70–99)
Potassium: 4.1 mmol/L (ref 3.5–5.1)
Sodium: 139 mmol/L (ref 135–145)

## 2023-04-22 LAB — CBC
HCT: 41.6 % (ref 39.0–52.0)
Hemoglobin: 13.4 g/dL (ref 13.0–17.0)
MCH: 27.8 pg (ref 26.0–34.0)
MCHC: 32.2 g/dL (ref 30.0–36.0)
MCV: 86.3 fL (ref 80.0–100.0)
Platelets: 292 10*3/uL (ref 150–400)
RBC: 4.82 MIL/uL (ref 4.22–5.81)
RDW: 17.5 % — ABNORMAL HIGH (ref 11.5–15.5)
WBC: 9.4 10*3/uL (ref 4.0–10.5)
nRBC: 0 % (ref 0.0–0.2)

## 2023-04-22 LAB — TROPONIN I (HIGH SENSITIVITY)
Troponin I (High Sensitivity): 12 ng/L (ref <18)
Troponin I (High Sensitivity): 14 ng/L (ref <18)

## 2023-04-22 SURGERY — HEMIARTHROPLASTY (BIPOLAR) HIP, POSTERIOR APPROACH FOR FRACTURE
Anesthesia: General | Site: Hip | Laterality: Left

## 2023-04-22 MED ORDER — PHENYLEPHRINE 80 MCG/ML (10ML) SYRINGE FOR IV PUSH (FOR BLOOD PRESSURE SUPPORT)
PREFILLED_SYRINGE | INTRAVENOUS | Status: AC
  Filled 2023-04-22: qty 10

## 2023-04-22 MED ORDER — SODIUM CHLORIDE 0.9 % IV SOLN: INTRAVENOUS | Status: AC

## 2023-04-22 MED ORDER — METOPROLOL TARTRATE 5 MG/5ML IV SOLN
5.0000 mg | INTRAVENOUS | Status: DC | PRN
  Administered 2023-04-25: 5 mg via INTRAVENOUS
  Filled 2023-04-22: qty 5

## 2023-04-22 MED ORDER — CEFAZOLIN SODIUM-DEXTROSE 2-4 GM/100ML-% IV SOLN
INTRAVENOUS | Status: AC
  Filled 2023-04-22: qty 100

## 2023-04-22 MED ORDER — ONDANSETRON HCL 4 MG PO TABS: 4.0000 mg | ORAL_TABLET | Freq: Four times a day (QID) | ORAL | Status: DC | PRN

## 2023-04-22 MED ORDER — FENTANYL CITRATE (PF) 100 MCG/2ML IJ SOLN
INTRAMUSCULAR | Status: DC | PRN
  Administered 2023-04-22: 50 ug via INTRAVENOUS

## 2023-04-22 MED ORDER — METOPROLOL SUCCINATE ER 25 MG PO TB24: 25.0000 mg | ORAL_TABLET | Freq: Every evening | ORAL | Status: DC

## 2023-04-22 MED ORDER — METOPROLOL TARTRATE 5 MG/5ML IV SOLN
5.0000 mg | Freq: Once | INTRAVENOUS | Status: AC
  Administered 2023-04-22: 5 mg via INTRAVENOUS
  Filled 2023-04-22: qty 5

## 2023-04-22 MED ORDER — ONDANSETRON HCL 4 MG/2ML IJ SOLN: 4.0000 mg | Freq: Four times a day (QID) | INTRAMUSCULAR | Status: DC | PRN

## 2023-04-22 MED ORDER — PHENYLEPHRINE HCL (PRESSORS) 10 MG/ML IV SOLN
INTRAVENOUS | Status: AC
  Filled 2023-04-22: qty 1

## 2023-04-22 MED ORDER — FLEET ENEMA RE ENEM: 1.0000 | ENEMA | Freq: Once | RECTAL | Status: DC | PRN

## 2023-04-22 MED ORDER — METOCLOPRAMIDE HCL 5 MG PO TABS: 5.0000 mg | ORAL_TABLET | Freq: Three times a day (TID) | ORAL | Status: DC | PRN

## 2023-04-22 MED ORDER — ONDANSETRON HCL 4 MG/2ML IJ SOLN
INTRAMUSCULAR | Status: DC | PRN
  Administered 2023-04-22: 4 mg via INTRAVENOUS

## 2023-04-22 MED ORDER — ONDANSETRON HCL 4 MG/2ML IJ SOLN: 4.0000 mg | Freq: Once | INTRAMUSCULAR | Status: DC | PRN

## 2023-04-22 MED ORDER — CEFAZOLIN SODIUM-DEXTROSE 2-4 GM/100ML-% IV SOLN: 2.0000 g | INTRAVENOUS | Status: DC

## 2023-04-22 MED ORDER — TRANEXAMIC ACID-NACL 1000-0.7 MG/100ML-% IV SOLN
INTRAVENOUS | Status: DC | PRN
Start: 2023-04-22 — End: 2023-04-22
  Administered 2023-04-22: 1000 mg via INTRAVENOUS

## 2023-04-22 MED ORDER — MAGNESIUM HYDROXIDE 400 MG/5ML PO SUSP: 30.0000 mL | Freq: Every day | ORAL | Status: DC | PRN

## 2023-04-22 MED ORDER — TRIAMCINOLONE ACETONIDE 40 MG/ML IJ SUSP
INTRAMUSCULAR | Status: DC | PRN
  Administered 2023-04-22: 80 mg

## 2023-04-22 MED ORDER — SODIUM CHLORIDE 0.9% FLUSH
INTRAVENOUS | Status: DC | PRN
  Administered 2023-04-22: 10 mL

## 2023-04-22 MED ORDER — ROCURONIUM BROMIDE 100 MG/10ML IV SOLN
INTRAVENOUS | Status: DC | PRN
  Administered 2023-04-22: 20 mg via INTRAVENOUS
  Administered 2023-04-22: 50 mg via INTRAVENOUS

## 2023-04-22 MED ORDER — BUPIVACAINE LIPOSOME 1.3 % IJ SUSP
INTRAMUSCULAR | Status: AC
  Filled 2023-04-22: qty 20

## 2023-04-22 MED ORDER — PHENYLEPHRINE 80 MCG/ML (10ML) SYRINGE FOR IV PUSH (FOR BLOOD PRESSURE SUPPORT)
PREFILLED_SYRINGE | INTRAVENOUS | Status: DC | PRN
  Administered 2023-04-22: 80 ug via INTRAVENOUS
  Administered 2023-04-22: 240 ug via INTRAVENOUS
  Administered 2023-04-22 (×2): 80 ug via INTRAVENOUS
  Administered 2023-04-22 (×3): 160 ug via INTRAVENOUS
  Administered 2023-04-22 (×2): 80 ug via INTRAVENOUS

## 2023-04-22 MED ORDER — FENTANYL CITRATE (PF) 100 MCG/2ML IJ SOLN
INTRAMUSCULAR | Status: AC
  Filled 2023-04-22: qty 2

## 2023-04-22 MED ORDER — DIPHENHYDRAMINE HCL 12.5 MG/5ML PO ELIX: 12.5000 mg | ORAL_SOLUTION | ORAL | Status: DC | PRN

## 2023-04-22 MED ORDER — ONDANSETRON HCL 4 MG/2ML IJ SOLN
4.0000 mg | Freq: Once | INTRAMUSCULAR | Status: AC
  Administered 2023-04-22: 4 mg via INTRAVENOUS
  Filled 2023-04-22: qty 2

## 2023-04-22 MED ORDER — HYDROCODONE-ACETAMINOPHEN 5-325 MG PO TABS
1.0000 | ORAL_TABLET | ORAL | Status: DC | PRN
  Administered 2023-04-23: 1 via ORAL
  Filled 2023-04-22: qty 1

## 2023-04-22 MED ORDER — SODIUM CHLORIDE 0.9 % IV SOLN
INTRAVENOUS | Status: DC | PRN
  Administered 2023-04-22: 250.4 mL via TOPICAL

## 2023-04-22 MED ORDER — BISACODYL 10 MG RE SUPP: 10.0000 mg | Freq: Every day | RECTAL | Status: DC | PRN

## 2023-04-22 MED ORDER — LACTATED RINGERS IV SOLN: INTRAVENOUS | Status: DC | PRN

## 2023-04-22 MED ORDER — SODIUM CHLORIDE (PF) 0.9 % IJ SOLN
INTRAMUSCULAR | Status: AC
  Filled 2023-04-22: qty 10

## 2023-04-22 MED ORDER — ACETAMINOPHEN 10 MG/ML IV SOLN
INTRAVENOUS | Status: DC | PRN
  Administered 2023-04-22: 1000 mg via INTRAVENOUS

## 2023-04-22 MED ORDER — BUPIVACAINE-EPINEPHRINE (PF) 0.5% -1:200000 IJ SOLN
INTRAMUSCULAR | Status: DC | PRN
  Administered 2023-04-22: 30 mL

## 2023-04-22 MED ORDER — DEXAMETHASONE SODIUM PHOSPHATE 10 MG/ML IJ SOLN
INTRAMUSCULAR | Status: DC | PRN
  Administered 2023-04-22: 10 mg via INTRAVENOUS

## 2023-04-22 MED ORDER — METOCLOPRAMIDE HCL 5 MG/ML IJ SOLN: 5.0000 mg | Freq: Three times a day (TID) | INTRAMUSCULAR | Status: DC | PRN

## 2023-04-22 MED ORDER — ACETAMINOPHEN 10 MG/ML IV SOLN
INTRAVENOUS | Status: AC
  Filled 2023-04-22: qty 100

## 2023-04-22 MED ORDER — ACETAMINOPHEN 10 MG/ML IV SOLN: 1000.0000 mg | Freq: Once | INTRAVENOUS | Status: DC | PRN

## 2023-04-22 MED ORDER — 0.9 % SODIUM CHLORIDE (POUR BTL) OPTIME
TOPICAL | Status: DC | PRN
  Administered 2023-04-22: 1000 mL

## 2023-04-22 MED ORDER — ENOXAPARIN SODIUM 40 MG/0.4ML IJ SOSY
40.0000 mg | PREFILLED_SYRINGE | INTRAMUSCULAR | Status: DC
  Administered 2023-04-23 – 2023-04-26 (×4): 40 mg via SUBCUTANEOUS
  Filled 2023-04-22 (×4): qty 0.4

## 2023-04-22 MED ORDER — ENOXAPARIN SODIUM 40 MG/0.4ML IJ SOSY: 40.0000 mg | PREFILLED_SYRINGE | INTRAMUSCULAR | Status: DC

## 2023-04-22 MED ORDER — PROPOFOL 10 MG/ML IV BOLUS
INTRAVENOUS | Status: DC | PRN
  Administered 2023-04-22: 100 mg via INTRAVENOUS

## 2023-04-22 MED ORDER — ACETAMINOPHEN 325 MG PO TABS
325.0000 mg | ORAL_TABLET | Freq: Four times a day (QID) | ORAL | Status: DC | PRN
  Administered 2023-04-24: 650 mg via ORAL
  Filled 2023-04-22: qty 2

## 2023-04-22 MED ORDER — FENTANYL CITRATE (PF) 100 MCG/2ML IJ SOLN
25.0000 ug | INTRAMUSCULAR | Status: DC | PRN
  Administered 2023-04-22: 50 ug via INTRAVENOUS

## 2023-04-22 MED ORDER — CEFAZOLIN SODIUM-DEXTROSE 2-3 GM-%(50ML) IV SOLR
INTRAVENOUS | Status: DC | PRN
  Administered 2023-04-22: 2 g via INTRAVENOUS

## 2023-04-22 MED ORDER — MORPHINE SULFATE (PF) 4 MG/ML IV SOLN
4.0000 mg | Freq: Once | INTRAVENOUS | Status: AC
  Administered 2023-04-22: 4 mg via INTRAVENOUS
  Filled 2023-04-22: qty 1

## 2023-04-22 MED ORDER — ONDANSETRON HCL 4 MG/2ML IJ SOLN
INTRAMUSCULAR | Status: AC
  Filled 2023-04-22: qty 2

## 2023-04-22 MED ORDER — SUGAMMADEX SODIUM 200 MG/2ML IV SOLN
INTRAVENOUS | Status: AC
  Filled 2023-04-22: qty 2

## 2023-04-22 MED ORDER — TRANEXAMIC ACID-NACL 1000-0.7 MG/100ML-% IV SOLN
INTRAVENOUS | Status: AC
  Filled 2023-04-22: qty 100

## 2023-04-22 MED ORDER — TRIAMCINOLONE ACETONIDE 40 MG/ML IJ SUSP
INTRAMUSCULAR | Status: AC
  Filled 2023-04-22: qty 2

## 2023-04-22 MED ORDER — DOCUSATE SODIUM 100 MG PO CAPS
100.0000 mg | ORAL_CAPSULE | Freq: Two times a day (BID) | ORAL | Status: DC
  Administered 2023-04-22 – 2023-04-26 (×8): 100 mg via ORAL
  Filled 2023-04-22 (×8): qty 1

## 2023-04-22 MED ORDER — CARBIDOPA-LEVODOPA 25-100 MG PO TABS
2.0000 | ORAL_TABLET | Freq: Three times a day (TID) | ORAL | Status: DC
  Administered 2023-04-23 – 2023-04-26 (×9): 2 via ORAL
  Filled 2023-04-22 (×9): qty 2

## 2023-04-22 MED ORDER — CARBIDOPA-LEVODOPA ER 50-200 MG PO TBCR
1.0000 | EXTENDED_RELEASE_TABLET | Freq: Every day | ORAL | Status: DC
  Administered 2023-04-23 – 2023-04-25 (×3): 1 via ORAL
  Filled 2023-04-22 (×3): qty 1

## 2023-04-22 MED ORDER — SODIUM CHLORIDE 0.9 % IR SOLN
Status: DC | PRN
  Administered 2023-04-22: 3000 mL

## 2023-04-22 MED ORDER — SUGAMMADEX SODIUM 200 MG/2ML IV SOLN
INTRAVENOUS | Status: DC | PRN
  Administered 2023-04-22: 200 mg via INTRAVENOUS

## 2023-04-22 MED ORDER — BUPIVACAINE-EPINEPHRINE (PF) 0.5% -1:200000 IJ SOLN
INTRAMUSCULAR | Status: AC
  Filled 2023-04-22: qty 30

## 2023-04-22 MED ORDER — KETOROLAC TROMETHAMINE 30 MG/ML IJ SOLN
INTRAMUSCULAR | Status: DC | PRN
  Administered 2023-04-22: 30 mg via INTRAMUSCULAR

## 2023-04-22 MED ORDER — CEFAZOLIN SODIUM-DEXTROSE 2-4 GM/100ML-% IV SOLN
2.0000 g | Freq: Four times a day (QID) | INTRAVENOUS | Status: AC
  Administered 2023-04-22 – 2023-04-23 (×2): 2 g via INTRAVENOUS
  Filled 2023-04-22 (×2): qty 100

## 2023-04-22 MED ORDER — BUPIVACAINE LIPOSOME 1.3 % IJ SUSP
INTRAMUSCULAR | Status: DC | PRN
  Administered 2023-04-22: 20 mL

## 2023-04-22 MED ORDER — HYDROMORPHONE HCL 1 MG/ML IJ SOLN
1.0000 mg | INTRAMUSCULAR | Status: DC | PRN
  Administered 2023-04-22 – 2023-04-23 (×4): 1 mg via INTRAVENOUS
  Filled 2023-04-22 (×5): qty 1

## 2023-04-22 SURGICAL SUPPLY — 61 items
BAG DECANTER FOR FLEXI CONT (MISCELLANEOUS) IMPLANT
BIT DRILL 2.7 (BIT) ×1 IMPLANT
BIT DRILL 2.7MM (BIT) IMPLANT
BLADE SAGITTAL WIDE XTHICK NO (BLADE) ×1 IMPLANT
BLADE SURG SZ20 CARB STEEL (BLADE) ×1 IMPLANT
BOWL CEMENT MIXING ADV NOZZLE (MISCELLANEOUS) IMPLANT
CEMENT BONE 40GM (Cement) IMPLANT
CHLORAPREP W/TINT 26 (MISCELLANEOUS) ×2 IMPLANT
DRAPE IMP U-DRAPE 54X76 (DRAPES) ×1 IMPLANT
DRAPE INCISE IOBAN 66X60 STRL (DRAPES) ×1 IMPLANT
DRAPE SURG 17X11 SM STRL (DRAPES) ×1 IMPLANT
DRAPE SURG 17X23 STRL (DRAPES) ×1 IMPLANT
DRSG MEPILEX SACRM 8.7X9.8 (GAUZE/BANDAGES/DRESSINGS) IMPLANT
DRSG OPSITE POSTOP 4X12 (GAUZE/BANDAGES/DRESSINGS) IMPLANT
DRSG OPSITE POSTOP 4X8 (GAUZE/BANDAGES/DRESSINGS) IMPLANT
ELECT BLADE 6.5 EXT (BLADE) IMPLANT
ELECT CAUTERY BLADE 6.4 (BLADE) ×1 IMPLANT
ELECT REM PT RETURN 9FT ADLT (ELECTROSURGICAL) ×1 IMPLANT
ELECTRODE REM PT RTRN 9FT ADLT (ELECTROSURGICAL) ×1 IMPLANT
GAUZE PACK 2X3YD (PACKING) IMPLANT
GAUZE XEROFORM 1X8 LF (GAUZE/BANDAGES/DRESSINGS) ×1 IMPLANT
GLOVE BIO SURGEON STRL SZ8 (GLOVE) ×3 IMPLANT
GLOVE BIOGEL M STRL SZ7.5 (GLOVE) IMPLANT
GLOVE BIOGEL PI IND STRL 8 (GLOVE) ×1 IMPLANT
GOWN STRL REUS W/ TWL LRG LVL3 (GOWN DISPOSABLE) ×1 IMPLANT
GOWN STRL REUS W/ TWL XL LVL3 (GOWN DISPOSABLE) ×1 IMPLANT
HEAD MOD COCR 28MM HD -6MM NK (Orthopedic Implant) IMPLANT
HOOD PEEL AWAY T7 (MISCELLANEOUS) ×3 IMPLANT
IV NS 100ML SINGLE PACK (IV SOLUTION) IMPLANT
KIT PREP HIP W/CEMENT RESTRICT (Miscellaneous) IMPLANT
LABEL OR SOLS (LABEL) ×1 IMPLANT
MANIFOLD NEPTUNE II (INSTRUMENTS) ×1 IMPLANT
NDL FILTER BLUNT 18X1 1/2 (NEEDLE) ×1 IMPLANT
NDL SAFETY ECLIPSE 18X1.5 (NEEDLE) ×1 IMPLANT
NDL SPNL 20GX3.5 QUINCKE YW (NEEDLE) ×1 IMPLANT
NEEDLE FILTER BLUNT 18X1 1/2 (NEEDLE) ×1 IMPLANT
NEEDLE SPNL 20GX3.5 QUINCKE YW (NEEDLE) ×1 IMPLANT
NS IRRIG 500ML POUR BTL (IV SOLUTION) ×1 IMPLANT
PACK HIP PROSTHESIS (MISCELLANEOUS) ×1 IMPLANT
PIN STEIN SMOOTH 4.8X9 (Pin) ×1 IMPLANT
PULSAVAC PLUS IRRIG FAN TIP (DISPOSABLE) ×1 IMPLANT
RINGBLOC BI POLAR 28X52MM (Orthopedic Implant) ×1 IMPLANT
SHELL RINGBLOC BI POLR 28X52MM (Orthopedic Implant) IMPLANT
SOL .9 NS 3000ML IRR UROMATIC (IV SOLUTION) ×2 IMPLANT
SPIKE FLUID TRANSFER (MISCELLANEOUS) ×2 IMPLANT
SPONGE T-LAP 18X18 ~~LOC~~+RFID (SPONGE) ×2 IMPLANT
STAPLER SKIN PROX 35W (STAPLE) ×1 IMPLANT
STEM FEM CENTRAL DIST HIP 17 (Stem) IMPLANT
STEM FEM CMT LAT HIP 15X170 (Stem) IMPLANT
STRAP SAFETY 5IN WIDE (MISCELLANEOUS) ×1 IMPLANT
SUT TICRON 2-0 30IN 311381 (SUTURE) ×4 IMPLANT
SUT VIC AB 0 CT1 36 (SUTURE) ×1 IMPLANT
SUT VIC AB 1 CT1 36 (SUTURE) ×1 IMPLANT
SUT VIC AB 2-0 CT1 (SUTURE) ×2 IMPLANT
SYR 10ML LL (SYRINGE) ×1 IMPLANT
SYR 30ML LL (SYRINGE) ×3 IMPLANT
SYR TB 1ML LL NO SAFETY (SYRINGE) IMPLANT
TIP BRUSH PULSAVAC PLUS 24.33 (MISCELLANEOUS) IMPLANT
TIP FAN IRRIG PULSAVAC PLUS (DISPOSABLE) ×1 IMPLANT
TRAP FLUID SMOKE EVACUATOR (MISCELLANEOUS) ×2 IMPLANT
WATER STERILE IRR 1000ML POUR (IV SOLUTION) ×1 IMPLANT

## 2023-04-22 NOTE — Assessment & Plan Note (Addendum)
 Noted multiple falls at home in the setting of Parkinson's disease Currently being admitted for traumatic left hip fracture Noted recent admission in the [facility] system February 2024 for issues including, subacute left subdural hemorrhage with 0.3 cm rightward midline shift, small left hemopneumothorax, small left lower lobe pulmonary laceration, acute fractures of the left 5-10 ribs, some of which are comminuted and segmental-stable Fall precautions  PT/OT evaluation  Monitor

## 2023-04-22 NOTE — Transfer of Care (Signed)
 Immediate Anesthesia Transfer of Care Note  Patient: KALVYN DESA  Procedure(s) Performed: HEMIARTHROPLASTY (BIPOLAR) HIP, POSTERIOR APPROACH FOR FRACTURE (Left: Hip)  Patient Location: PACU  Anesthesia Type:General  Level of Consciousness: drowsy and patient cooperative  Airway & Oxygen Therapy: Patient Spontanous Breathing and Patient connected to face mask oxygen  Post-op Assessment: Report given to RN and Post -op Vital signs reviewed and stable  Post vital signs: Reviewed and stable  Last Vitals:  Vitals Value Taken Time  BP 106/64 04/22/23 1845  Temp    Pulse 101 04/22/23 1846  Resp 18 04/22/23 1846  SpO2 100 % 04/22/23 1846  Vitals shown include unfiled device data.  Last Pain:  Vitals:   04/22/23 1603  TempSrc: Temporal  PainSc: 9       Patients Stated Pain Goal: 0 (04/22/23 1603)  Complications: No notable events documented.

## 2023-04-22 NOTE — Consult Note (Signed)
 ORTHOPAEDIC CONSULTATION  REQUESTING PHYSICIAN: Floydene Flock, MD  Chief Complaint:   Left hip pain.  History of Present Illness: Daniel [name] is an 83 y.o. male with multiple medical problems including hypertension, coronary artery disease, chronic atrial fibrillation, presence of a permanent cardiac pacemaker, and Parkinson's disease who normally lives independently with his wife.  The patient normally ambulates with a walker due to his history of Parkinson's disease.  Unfortunately, because of his Parkinson disease, he has been falling quite a bit.  He was admitted several weeks ago and urgently transferred to [facility] for treatment of an intracranial bleed following a fall.  Since then, his blood thinners to treat his chronic atrial fibrillation have been discontinued.  Apparently, the patient lost his balance and fell this afternoon while in his home.  He was brought to the emergency room where x-rays have demonstrated a displaced left femoral neck fracture.  The patient has been admitted in anticipation of definitive management of this injury.  The patient denies any associated injuries.  He also denies any lightheadedness, dizziness, chest pain, shortness of breath, or other symptoms which may have precipitated his fall.  Past Medical History:  Diagnosis Date   AF (atrial fibrillation) (HCC)    B12 deficiency    Coronary artery disease    DH (dermatitis herpetiformis)    Dysphagia    Hypertension    Presence of permanent cardiac pacemaker    2006   Primary parkinsonism [facility])    Prostate cancer (HCC) 2014   Past Surgical History:  Procedure Laterality Date   APPENDECTOMY     CARDIAC CATHETERIZATION     CATARACT EXTRACTION W/PHACO Left 05/04/2020   Procedure: CATARACT EXTRACTION PHACO AND INTRAOCULAR LENS PLACEMENT (IOC) LEFT  6.76 00:59.9 11.3%;  Surgeon: Lockie Mola, MD;  Location: [facility];  Service:  Ophthalmology;  Laterality: Left;   CATARACT EXTRACTION W/PHACO Right 05/18/2020   Procedure: CATARACT EXTRACTION PHACO AND INTRAOCULAR LENS PLACEMENT (IOC) RIGHT 15.56 01:52.6 13.8%;  Surgeon: Lockie Mola, MD;  Location: [facility];  Service: Ophthalmology;  Laterality: Right;   COLONOSCOPY  2008, 2014   CYSTOSCOPY WITH LITHOLAPAXY N/A 09/17/2019   Procedure: CYSTOSCOPY WITH LITHOLAPAXY;  Surgeon: Orson Ape, MD;  Location: [facility] ORS;  Service: Urology;  Laterality: N/A;   ESOPHAGOGASTRODUODENOSCOPY (EGD) WITH PROPOFOL N/A 10/13/2015   Procedure: ESOPHAGOGASTRODUODENOSCOPY (EGD) WITH PROPOFOL;  Surgeon: Christena Deem, MD;  Location: [facility];  Service: Endoscopy;  Laterality: N/A;   FASCIOTOMY Left 01/30/2019   Procedure: I&D BELOW FASCIA FOOT SINGLE LEFT;  Surgeon: Gwyneth Revels, DPM;  Location: [facility] ORS;  Service: Podiatry;  Laterality: Left;   GREEN LIGHT LASER TURP (TRANSURETHRAL RESECTION OF PROSTATE N/A 09/17/2019   Procedure: GREEN LIGHT LASER TURP (TRANSURETHRAL RESECTION OF PROSTATE;  Surgeon: Orson Ape, MD;  Location: [facility] ORS;  Service: Urology;  Laterality: N/A;   PACEMAKER PLACEMENT  2006   Social History   Socioeconomic History   Marital status: Married    Spouse name: Not on file   Number of children: Not on file   Years of education: Not on file   Highest education level: Not on file  Occupational History   Not on file  Tobacco Use   Smoking status: Never   Smokeless tobacco: Never  Vaping Use   Vaping status: Never Used  Substance and Sexual Activity   Alcohol use: Yes    Comment: RARELY   Drug use: No   Sexual activity: Not on file  Other Topics Concern   Not on file  Social History Narrative   Not on file   Social Drivers of Health   Financial Resource Strain: Low Risk  (02/26/2023)   Received from [facility]   Overall Financial Resource Strain (CARDIA)    Difficulty of Paying Living Expenses: Not hard  at all  Food Insecurity: No Food Insecurity (04/22/2023)   Hunger Vital Sign    Worried About Running Out of Food in the Last Year: Never true    Ran Out of Food in the Last Year: Never true  Transportation Needs: No Transportation Needs (04/22/2023)   PRAPARE - Administrator, Civil Service (Medical): No    Lack of Transportation (Non-Medical): No  Physical Activity: Not on file  Stress: Not on file  Social Connections: Not on file   Family History  Problem Relation Age of Onset   Heart attack Mother    Heart attack Father    Allergies  Allergen Reactions   Clindamycin/Lincomycin Hives   Doxycycline Rash   Hydrocodone-Acetaminophen Rash   Levaquin [Levofloxacin] Rash   Prior to Admission medications   Medication Sig Start Date End Date Taking? Authorizing Provider  aspirin EC 81 MG tablet Take 81 mg by mouth at bedtime.    [provider]  carbidopa-levodopa (SINEMET CR) 50-200 MG tablet Take 1 tablet by mouth at bedtime. 08/07/19   [provider]  carbidopa-levodopa (SINEMET IR) 25-100 MG tablet Take 2 tablets by mouth 3 (three) times daily.  01/07/19   [provider]  cholecalciferol (VITAMIN D) 25 MCG (1000 UT) tablet Take 1,000 Units by mouth daily.    [provider]  cyanocobalamin 1000 MCG tablet Take 1,000 mcg by mouth daily.    [provider]  dapsone 25 MG tablet Take by mouth. 12/17/22   [provider]  latanoprost (XALATAN) 0.005 % ophthalmic solution Place 1 drop into both eyes at bedtime.     [provider]  metoprolol succinate (TOPROL-XL) 25 MG 24 hr tablet Take 25 mg by mouth every evening.     [provider]  metoprolol succinate (TOPROL-XL) 25 MG 24 hr tablet Take 1 tablet by mouth daily. 11/27/21   [provider]  silodosin (RAPAFLO) 8 MG CAPS capsule Take 1 capsule (8 mg total) by mouth daily with breakfast. 02/08/23   Lonna Cobb, Verna Czech, MD   DG Hip Unilat W or Wo  Pelvis 2-3 Views Left Result Date: 04/22/2023 CLINICAL DATA:  Fall and left hip pain. EXAM: DG HIP (WITH OR WITHOUT PELVIS) 2-3V LEFT COMPARISON:  None Available. FINDINGS: Mildly displaced and impacted fracture of the left femoral neck. No dislocation. The bones are osteopenic. The soft tissues are unremarkable. IMPRESSION: Mildly displaced and impacted fracture of the left femoral neck. Electronically Signed   By: Elgie Collard M.D.   On: 04/22/2023 14:55   DG Chest Portable 1 View Result Date: 04/22/2023 CLINICAL DATA:  Fall, hip fracture EXAM: PORTABLE CHEST 1 VIEW COMPARISON:  CT chest 03/16/2023 FINDINGS: Dual lead pacer noted. The patient is rotated to the left on today's radiograph, reducing diagnostic sensitivity and specificity. Low lung volumes are present, causing crowding of the pulmonary vasculature. The patient had multiple acute left lower posterior rib fractures on the CT from 03/16/2023, these are only faintly appreciable on today's chest radiograph. The lungs appear clear. Heart size within normal limits. IMPRESSION: 1. Low lung volumes, causing crowding of the pulmonary vasculature. 2. The patient  had multiple acute left lower posterior rib fractures on the CT from 03/16/2023, these are only faintly appreciable on today's chest radiograph. Electronically Signed   By: Gaylyn Rong M.D.   On: 04/22/2023 14:55    Positive ROS: All other systems have been reviewed and were otherwise negative with the exception of those mentioned in the HPI and as above.  Physical Exam: General:  Alert, no acute distress Psychiatric:  Patient appears to be competent for consent with normal mood and affect   Cardiovascular:  No pedal edema Respiratory:  No wheezing, non-labored breathing GI:  Abdomen is soft and non-tender Skin:  No lesions in the area of chief complaint Neurologic:  Sensation intact distally Lymphatic:  No axillary or cervical lymphadenopathy  Orthopedic Exam:  Orthopedic  examination is limited to the left hip and lower extremity.  The left lower extremity is somewhat shortened and externally rotated as compared to the right.  Skin inspection around the left hip is unremarkable.  No swelling, erythema, ecchymosis, abrasions, or other skin abnormalities are identified.  He has mild effusion over the lateral aspect of the left hip.  He has more severe pain with any attempted active or passive motion of the hip.  He is grossly neurovascularly intact to the left lower extremity and foot.  X-rays:  Recent x-rays of the pelvis and left hip are available for review and have been reviewed by myself.  These films demonstrate a displaced left femoral neck fracture.  No significant degenerative changes of the left hip joint are identified.  No lytic lesions or other acute bony abnormalities are identified.  However, moderate osteopenia is noted throughout the visualized bony structures.  Assessment: Displaced left femoral neck fracture.  Plan: The treatment options, including both surgical and nonsurgical choices, have been discussed in detail with the patient and his family.  The patient would like to proceed with surgical intervention to include a left hip hemiarthroplasty.  The risks (including bleeding, infection, nerve and/or blood vessel injury, persistent or recurrent pain, loosening or failure of the components, leg length inequality, dislocation, need for further surgery, blood clots, strokes, heart attacks or arrhythmias, pneumonia, etc.) and benefits of the surgical procedure were discussed.  The patient states his understanding and agrees to proceed.  He agrees to a blood transfusion if necessary.  A formal written consent will be obtained by the nursing staff.  Thank you for asking me to participate in the care of this most pleasant young unfortunate man.  I will be happy to follow him with you.   Maryagnes Amos, MD  Beeper #:  [phone]  04/22/2023 3:20  PM

## 2023-04-22 NOTE — ED Provider Notes (Signed)
 [facility] Provider Note    Event Date/Time   First MD Initiated Contact with Patient 04/22/23 1304     (approximate)   History   Fall and Hip Pain   HPI  Daniel [name] is a 83 y.o. male with a history of Parkinson's disease and atrial fibrillation not on anticoagulation who presents with left hip pain after mechanical fall from standing height while the patient was trying to walk with his walker.  He denies any his head.  He did not feel dizzy or lightheaded.  He did not pass out.  He denies any neck or back pain.  He has no other injuries.  He is unable to bear weight on the left leg now.  I reviewed the past medical records.  The patient was seen in the ED on 2/22 for a fall with workup showing rib fractures and a subdural hemorrhage and was transferred to [facility].  He was seen in internal medicine on 3/21 for follow-up.   Physical Exam   Triage Vital Signs: ED Triage Vitals  Encounter Vitals Group     BP 04/22/23 1222 (!) 136/102     Systolic BP Percentile --      Diastolic BP Percentile --      Pulse Rate 04/22/23 1222 (!) 120     Resp 04/22/23 1222 20     Temp 04/22/23 1222 98 F (36.7 C)     Temp Source 04/22/23 1222 Oral     SpO2 04/22/23 1222 95 %     Weight --      Height --      Head Circumference --      Peak Flow --      Pain Score 04/22/23 1223 9     Pain Loc --      Pain Education --      Exclude from Growth Chart --     Most recent vital signs: Vitals:   04/22/23 1515 04/22/23 1516  BP:    Pulse: (!) 53 69  Resp: 16 17  Temp:    SpO2: 93% 95%     General: Alert, no distress.  CV:  Good peripheral perfusion.  Tachycardic, irregular rhythm. Resp:  Normal effort.  Abd:  No distention.  Other:  Left lateral hip tenderness.  Left leg shortened.  2+ DP pulse.  No midline spinal tenderness.   ED Results / Procedures / Treatments   Labs (all labs ordered are listed, but only abnormal results are displayed) Labs Reviewed   BASIC METABOLIC PANEL WITH GFR - Abnormal; Notable for the following components:      Result Value   Glucose, Bld 101 (*)    BUN 36 (*)    All other components within normal limits  CBC - Abnormal; Notable for the following components:   RDW 17.5 (*)    All other components within normal limits  TROPONIN I (HIGH SENSITIVITY)  TROPONIN I (HIGH SENSITIVITY)     EKG  ED ECG REPORT I, Dionne Bucy, the attending physician, personally viewed and interpreted this ECG.  Date: 04/22/2023 EKG Time: 1228 Rate: 111 Rhythm: Atrial fibrillation with RVR QRS Axis: normal Intervals: normal ST/T Wave abnormalities: Nonspecific ST abnormalities Narrative Interpretation: no evidence of acute ischemia    RADIOLOGY  XR L hip: I independently viewed and interpreted the images; there is a femoral neck fracture  Chest x-ray: No focal consolidation or edema  PROCEDURES:  Critical Care performed: No  Procedures  MEDICATIONS ORDERED IN ED: Medications  HYDROmorphone (DILAUDID) injection 1 mg ( Intravenous MAR Hold 04/22/23 1550)  metoprolol succinate (TOPROL-XL) 24 hr tablet 25 mg ( Oral Automatically Held 05/01/23 1800)  carbidopa-levodopa (SINEMET IR) 25-100 MG per tablet immediate release 2 tablet ( Oral Automatically Held 05/01/23 2200)  carbidopa-levodopa (SINEMET CR) 50-200 MG per tablet controlled release 1 tablet ( Oral Automatically Held 05/01/23 2200)  enoxaparin (LOVENOX) injection 40 mg ( Subcutaneous Automatically Held 05/01/23 0800)  ondansetron (ZOFRAN) tablet 4 mg ( Oral MAR Hold 04/22/23 1550)    Or  ondansetron (ZOFRAN) injection 4 mg ( Intravenous MAR Hold 04/22/23 1550)  0.9 %  sodium chloride infusion (has no administration in time range)  ceFAZolin (ANCEF) IVPB 2g/100 mL premix ( Intravenous MAR Hold 04/22/23 1550)  metoprolol tartrate (LOPRESSOR) injection 5 mg ( Intravenous MAR Hold 04/22/23 1550)  metoprolol tartrate (LOPRESSOR) injection 5 mg (5 mg Intravenous Given  04/22/23 1411)  ondansetron (ZOFRAN) injection 4 mg (4 mg Intravenous Given 04/22/23 1411)  morphine (PF) 4 MG/ML injection 4 mg (4 mg Intravenous Given 04/22/23 1411)     IMPRESSION / MDM / ASSESSMENT AND PLAN / ED COURSE  I reviewed the triage vital signs and the nursing notes.  83 year old male with PMH as noted above, with atrial fibrillation but not on anticoagulation, presents after mechanical fall from standing height with left hip pain.  On exam the patient is tachycardic in atrial fibrillation.  Other vital signs are normal.  He has pain on any range of motion of the left hip although the extremities neuro/vascular intact.  Differential diagnosis includes, but is not limited to, hip or pelvic fracture, contusion, muscle strain.  Patient's presentation is most consistent with acute presentation with potential threat to life or bodily function.  The patient is on the cardiac monitor to evaluate for evidence of arrhythmia and/or significant heart rate changes.  X-rays reveal a femoral neck fracture.  BMP and CBC were obtained and are unremarkable.  Troponin is negative.  The patient is in atrial fibrillation ranging from a rate of about 110-130 so we will give a dose of IV metoprolol.  I consulted Dr. Joice Lofts from orthopedics who is currently in the OR but will review the case.  ----------------------------------------- 2:12 PM on 04/22/2023 -----------------------------------------  I consulted Dr. Alvester Morin from the hospitalist service; based on our discussion he agrees to evaluate the patient for admission.   FINAL CLINICAL IMPRESSION(S) / ED DIAGNOSES   Final diagnoses:  Closed fracture of neck of left femur, initial encounter (HCC)  Atrial fibrillation with RVR (HCC)     Rx / DC Orders   ED Discharge Orders     None        Note:  This document was prepared using Dragon voice recognition software and may include unintentional dictation errors.    Dionne Bucy,  MD 04/22/23 [phone]

## 2023-04-22 NOTE — ED Notes (Addendum)
 CCMD called at this time

## 2023-04-22 NOTE — ED Triage Notes (Addendum)
 Pt to ED via [facility] from home. Pt reports lost his balance and fell hitting door post with left hip. Pt reports 9/10 left hip pain. Pt unable to straighten leg out due to arthritis so unsure if pt has rotation or shortening. Pt denies LOC or head trauma. No blood thinner. Pt with hx of parkinsons and reports falls a lot. Pt has pacemaker. Pt HR in triage 120s

## 2023-04-22 NOTE — Assessment & Plan Note (Signed)
 Baseline Parkinson's disease on Sinemet Notable multiple recent falls Currently with left hip fracture Continue Sinemet Fall precautions PT OT evaluation

## 2023-04-22 NOTE — Anesthesia Postprocedure Evaluation (Signed)
 Anesthesia Post Note  Patient: Daniel [name]  Procedure(s) Performed: HEMIARTHROPLASTY (BIPOLAR) HIP, POSTERIOR APPROACH FOR FRACTURE (Left: Hip)  Patient location during evaluation: PACU Anesthesia Type: General Level of consciousness: awake and alert Pain management: pain level controlled Vital Signs Assessment: post-procedure vital signs reviewed and stable Respiratory status: spontaneous breathing, nonlabored ventilation, respiratory function stable and patient connected to nasal cannula oxygen Cardiovascular status: blood pressure returned to baseline and stable Postop Assessment: no apparent nausea or vomiting Anesthetic complications: no   No notable events documented.   Last Vitals:  Vitals:   04/22/23 1945 04/22/23 1956  BP: 131/71 108/80  Pulse: (!) 119 (!) 142  Resp: 14   Temp:  36.8 C  SpO2: 100% 93%    Last Pain:  Vitals:   04/22/23 1956  TempSrc: Oral  PainSc:                  [name]

## 2023-04-22 NOTE — Assessment & Plan Note (Signed)
 Baseline history of atrial fibrillation Not on anticoagulation in setting of fall risk On metoprolol chronically Heart rate 90s to low 100s IV as needed metoprolol for systolic heart rate greater than 100 as needed Monitor Consult cardiology as appropriate

## 2023-04-22 NOTE — Anesthesia Preprocedure Evaluation (Addendum)
 Anesthesia Evaluation  Patient identified by MRN, date of birth, ID band Patient awake  General Assessment Comment:Patient AO x 3, but difficult to understand given "stone-faced" parkinsonism.  Reviewed: Allergy & Precautions, H&P , NPO status , Patient's Chart, lab work & pertinent test results, reviewed documented beta blocker date and time   History of Anesthesia Complications Negative for: history of anesthetic complications  Airway Mallampati: III  TM Distance: >3 FB Neck ROM: full  Mouth opening: Limited Mouth Opening  Dental no notable dental hx. (+) Teeth Intact   Pulmonary neg pulmonary ROS, neg sleep apnea, neg COPD, Patient abstained from smoking.Not current smoker   Pulmonary exam normal breath sounds clear to auscultation       Cardiovascular Exercise Tolerance: Poor METShypertension, Pt. on medications + CAD  (-) Past MI + dysrhythmias Atrial Fibrillation + pacemaker  Rhythm:regular Rate:Normal - Systolic murmurs    Neuro/Psych  Trauma from a fall last month, found to have  multiple rib fractures and subdural hematoma. No intervention done. Per wife, they suggested an outpatient procedure (unsure which) to address subdural, but that they told her it wasn't emergent. negative neurological ROS  negative psych ROS   GI/Hepatic Neg liver ROS,neg GERD  ,, Dysphagia noted in chart; per patient and wife, he normally eats without issue, no history of aspiration.   Endo/Other  negative endocrine ROSneg diabetes    Renal/GU negative Renal ROS  negative genitourinary   Musculoskeletal   Abdominal   Peds  Hematology negative hematology ROS (+)   Anesthesia Other Findings Past Medical History: No date: AF (atrial fibrillation) (HCC) No date: B12 deficiency No date: Coronary artery disease 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) No date: Dysphagia No date: Hypertension No date: Presence of permanent cardiac  pacemaker     Comment:  2006 No date: Primary parkinsonism (HCC) 2014: Prostate cancer [facility]) Past Surgical History: No date: APPENDECTOMY No date: CARDIAC CATHETERIZATION 05/04/2020: CATARACT EXTRACTION W/PHACO; Left     Comment:  Procedure: CATARACT EXTRACTION PHACO AND INTRAOCULAR               LENS PLACEMENT (IOC) LEFT  6.76 00:59.9 11.3%;  Surgeon:               Lockie Mola, MD;  Location: [facility];              Service: Ophthalmology;  Laterality: Left; 05/18/2020: CATARACT EXTRACTION W/PHACO; Right     Comment:  Procedure: CATARACT EXTRACTION PHACO AND INTRAOCULAR               LENS PLACEMENT (IOC) RIGHT 15.56 01:52.6 13.8%;  Surgeon:              Lockie Mola, MD;  Location: [facility];              Service: Ophthalmology;  Laterality: Right; 2008, 2014: COLONOSCOPY 09/17/2019: CYSTOSCOPY WITH LITHOLAPAXY; N/A     Comment:  Procedure: CYSTOSCOPY WITH LITHOLAPAXY;  Surgeon: Orson Ape, MD;  Location: [facility];  Service: Urology;                Laterality: N/A; 10/13/2015: ESOPHAGOGASTRODUODENOSCOPY (EGD) WITH PROPOFOL; N/A     Comment:  Procedure: ESOPHAGOGASTRODUODENOSCOPY (EGD) WITH               PROPOFOL;  Surgeon: Christena Deem, MD;  Location:  [facility];  Service: Endoscopy;  Laterality: N/A; 01/30/2019: FASCIOTOMY; Left     Comment:  Procedure: I&D BELOW FASCIA FOOT SINGLE LEFT;  Surgeon:               Gwyneth Revels, DPM;  Location: [facility];  Service:               Podiatry;  Laterality: Left; 09/17/2019: GREEN LIGHT LASER TURP (TRANSURETHRAL RESECTION OF  PROSTATE; N/A     Comment:  Procedure: GREEN LIGHT LASER TURP (TRANSURETHRAL               RESECTION OF PROSTATE;  Surgeon: Orson Ape, MD;                Location: [facility];  Service: Urology;  Laterality: N/A; 2006: PACEMAKER PLACEMENT BMI    Body Mass Index: 20.54 kg/m     Reproductive/Obstetrics negative OB ROS                              Anesthesia Physical Anesthesia Plan  ASA: 3  Anesthesia Plan: General   Post-op Pain Management: Ofirmev IV (intra-op)*   Induction: Intravenous  PONV Risk Score and Plan: 2 and Ondansetron, Dexamethasone and Treatment may vary due to age or medical condition  Airway Management Planned: Oral ETT and Video Laryngoscope Planned  Additional Equipment: None  Intra-op Plan:   Post-operative Plan: Extubation in OR  Informed Consent: I have reviewed the patients History and Physical, chart, labs and discussed the procedure including the risks, benefits and alternatives for the proposed anesthesia with the patient or authorized representative who has indicated his/her understanding and acceptance.     Dental Advisory Given  Plan Discussed with: CRNA  Anesthesia Plan Comments: (Discussed risks of anesthesia with patient and his wife [name] at bedside, including PONV, sore throat, lip/dental/eye damage, post operative cognitive dysfunction.. Rare risks discussed as well, such as cardiorespiratory and neurological sequelae, and allergic reactions. I did discuss r/b/a of spinal anesthesia, but that given patient's recent subdural hematoma, a spinal could put him at increased risk for rebleed.  Discussed the role of CRNA in patient's perioperative care. Patient understands.)       Anesthesia Quick Evaluation

## 2023-04-22 NOTE — H&P (Addendum)
 History and Physical    Patient: Daniel [name]:[id number] DOB: 20-Mar-1940 DOA: 04/22/2023 DOS: the patient was seen and examined on 04/22/2023 PCP: Jerl Mina, MD  Patient coming from: Home  Chief Complaint:  Chief Complaint  Patient presents with   Fall   Hip Pain   HPI: Daniel [name] is a 83 y.o. male with medical history significant of atrial fibrillation, CAD, hypertension, dysphagia, Parkinson's disease, recurrent falls presenting with fall and left hip fracture.  Wife reports patient attempted to ambulate when he missed his walker subsequently falling on his left side.  Mild head trauma.  No loss of conscious.  No chest pain or shortness of breath.  Noted baseline Parkinson's disease.  Per the wife, he has had several falls over the past few months.  Noted mission in February 2024 into the [facility] system for issues including subacute left subdural hemorrhage with 0.3 cm rightward midline shift, small left hemopneumothorax, small left lower lobe pulmonary laceration, acute fractures of the left 5-10 ribs associated with fall.  Has had otherwise stable post hospital course per the wife.  No chest pain or shortness of breath.  No nausea or vomiting.  No recent fevers or chills.  Does report mild progressive decline functionally at home. Presented to the ER afebrile, heart rate 60s to 120s, BP stable.  Satting well on room air.  CBC and CMP grossly within normal limits.  Troponin negative x 1. EKG A-fib with heart rate in the 110s.  Chest x-ray within normal limits.  Left hip plain films with mildly displaced and impacted fracture of left femoral neck. Review of Systems: As mentioned in the history of present illness. All other systems reviewed and are negative. Past Medical History:  Diagnosis Date   AF (atrial fibrillation) (HCC)    B12 deficiency    Coronary artery disease    DH (dermatitis herpetiformis)    Dysphagia    Hypertension    Presence of permanent cardiac pacemaker     2006   Primary parkinsonism [facility])    Prostate cancer (HCC) 2014   Past Surgical History:  Procedure Laterality Date   APPENDECTOMY     CARDIAC CATHETERIZATION     CATARACT EXTRACTION W/PHACO Left 05/04/2020   Procedure: CATARACT EXTRACTION PHACO AND INTRAOCULAR LENS PLACEMENT (IOC) LEFT  6.76 00:59.9 11.3%;  Surgeon: Lockie Mola, MD;  Location: [facility];  Service: Ophthalmology;  Laterality: Left;   CATARACT EXTRACTION W/PHACO Right 05/18/2020   Procedure: CATARACT EXTRACTION PHACO AND INTRAOCULAR LENS PLACEMENT (IOC) RIGHT 15.56 01:52.6 13.8%;  Surgeon: Lockie Mola, MD;  Location: [facility];  Service: Ophthalmology;  Laterality: Right;   COLONOSCOPY  2008, 2014   CYSTOSCOPY WITH LITHOLAPAXY N/A 09/17/2019   Procedure: CYSTOSCOPY WITH LITHOLAPAXY;  Surgeon: Orson Ape, MD;  Location: [facility];  Service: Urology;  Laterality: N/A;   ESOPHAGOGASTRODUODENOSCOPY (EGD) WITH PROPOFOL N/A 10/13/2015   Procedure: ESOPHAGOGASTRODUODENOSCOPY (EGD) WITH PROPOFOL;  Surgeon: Christena Deem, MD;  Location: [facility];  Service: Endoscopy;  Laterality: N/A;   FASCIOTOMY Left 01/30/2019   Procedure: I&D BELOW FASCIA FOOT SINGLE LEFT;  Surgeon: Gwyneth Revels, DPM;  Location: [facility];  Service: Podiatry;  Laterality: Left;   GREEN LIGHT LASER TURP (TRANSURETHRAL RESECTION OF PROSTATE N/A 09/17/2019   Procedure: GREEN LIGHT LASER TURP (TRANSURETHRAL RESECTION OF PROSTATE;  Surgeon: Orson Ape, MD;  Location: [facility];  Service: Urology;  Laterality: N/A;   PACEMAKER PLACEMENT  2006   Social History:  reports  that he has never smoked. He has never used smokeless tobacco. He reports current alcohol use. He reports that he does not use drugs.  Allergies  Allergen Reactions   Clindamycin/Lincomycin Hives   Doxycycline Rash   Hydrocodone-Acetaminophen Rash   Levaquin [Levofloxacin] Rash    Family History  Problem Relation Age of Onset   Heart attack  Mother    Heart attack Father     Prior to Admission medications   Medication Sig Start Date End Date Taking? Authorizing Provider  aspirin EC 81 MG tablet Take 81 mg by mouth at bedtime.    [provider]  carbidopa-levodopa (SINEMET CR) 50-200 MG tablet Take 1 tablet by mouth at bedtime. 08/07/19   [provider]  carbidopa-levodopa (SINEMET IR) 25-100 MG tablet Take 2 tablets by mouth 3 (three) times daily.  01/07/19   [provider]  cholecalciferol (VITAMIN D) 25 MCG (1000 UT) tablet Take 1,000 Units by mouth daily.    [provider]  cyanocobalamin 1000 MCG tablet Take 1,000 mcg by mouth daily.    [provider]  dapsone 25 MG tablet Take by mouth. 12/17/22   [provider]  latanoprost (XALATAN) 0.005 % ophthalmic solution Place 1 drop into both eyes at bedtime.     [provider]  metoprolol succinate (TOPROL-XL) 25 MG 24 hr tablet Take 25 mg by mouth every evening.     [provider]  metoprolol succinate (TOPROL-XL) 25 MG 24 hr tablet Take 1 tablet by mouth daily. 11/27/21   [provider]  silodosin (RAPAFLO) 8 MG CAPS capsule Take 1 capsule (8 mg total) by mouth daily with breakfast. 02/08/23   Riki Altes, MD    Physical Exam: Vitals:   04/22/23 1458 04/22/23 1514 04/22/23 1515 04/22/23 1516  BP:      Pulse: (!) 119 81 (!) 53 69  Resp: 20 17 16 17   Temp:      TempSrc:      SpO2: 96% 93% 93% 95%  Height:       Physical Exam Constitutional:      Appearance: He is normal weight.  HENT:     Head: Normocephalic and atraumatic.     Nose: Nose normal.     Mouth/Throat:     Mouth: Mucous membranes are moist.  Eyes:     Pupils: Pupils are equal, round, and reactive to light.  Cardiovascular:     Rate and Rhythm: Normal rate and regular rhythm.  Pulmonary:     Effort: Pulmonary effort is normal.  Abdominal:     General: Bowel sounds are normal.  Musculoskeletal:     Comments: +  TTP and decreased ROM of the L hip  Skin:    General: Skin is warm.  Neurological:     General: No focal deficit present.  Psychiatric:        Mood and Affect: Mood normal.     Data Reviewed:  There are no new results to review at this time.  DG Hip Unilat W or Wo Pelvis 2-3 Views Left CLINICAL DATA:  Fall and left hip pain.  EXAM: DG HIP (WITH OR WITHOUT PELVIS) 2-3V LEFT  COMPARISON:  None Available.  FINDINGS: Mildly displaced and impacted fracture of the left femoral neck. No dislocation. The bones are osteopenic. The soft tissues are unremarkable.  IMPRESSION: Mildly displaced and impacted fracture of the left femoral neck.  Electronically Signed   By: Elgie Collard M.D.   On:  04/22/2023 14:55 DG Chest Portable 1 View CLINICAL DATA:  Fall, hip fracture  EXAM: PORTABLE CHEST 1 VIEW  COMPARISON:  CT chest 03/16/2023  FINDINGS: Dual lead pacer noted. The patient is rotated to the left on today's radiograph, reducing diagnostic sensitivity and specificity.  Low lung volumes are present, causing crowding of the pulmonary vasculature. The patient had multiple acute left lower posterior rib fractures on the CT from 03/16/2023, these are only faintly appreciable on today's chest radiograph. The lungs appear clear. Heart size within normal limits.  IMPRESSION: 1. Low lung volumes, causing crowding of the pulmonary vasculature. 2. The patient had multiple acute left lower posterior rib fractures on the CT from 03/16/2023, these are only faintly appreciable on today's chest radiograph.  Electronically Signed   By: Gaylyn Rong M.D.   On: 04/22/2023 14:55  Lab Results  Component Value Date   WBC 9.4 04/22/2023   HGB 13.4 04/22/2023   HCT 41.6 04/22/2023   MCV 86.3 04/22/2023   PLT 292 04/22/2023   Last metabolic panel Lab Results  Component Value Date   GLUCOSE 101 (H) 04/22/2023   NA 139 04/22/2023   K 4.1 04/22/2023   CL 103 04/22/2023    CO2 25 04/22/2023   BUN 36 (H) 04/22/2023   CREATININE 1.15 04/22/2023   GFRNONAA >60 04/22/2023   CALCIUM 9.7 04/22/2023   PROT 7.3 03/16/2023   ALBUMIN 4.0 03/16/2023   BILITOT 0.9 03/16/2023   ALKPHOS 94 03/16/2023   AST 31 03/16/2023   ALT 6 03/16/2023   ANIONGAP 11 04/22/2023    Assessment and Plan: * Hip fracture (HCC) Noted left hip fracture status post mechanical fall in setting of baseline Parkinson's disease Dr. Joice Lofts with orthopedic surgery aware of case Plan for operative repair Pain control PT OT evaluation Fall precautions Follow-up orthopedic recommendations  Fall Noted multiple falls at home in the setting of Parkinson's disease Currently being admitted for traumatic left hip fracture Noted recent admission in the [facility] system February 2024 for issues including, subacute left subdural hemorrhage with 0.3 cm rightward midline shift, small left hemopneumothorax, small left lower lobe pulmonary laceration, acute fractures of the left 5-10 ribs, some of which are comminuted and segmental-stable Fall precautions  PT/OT evaluation  Monitor     Atrial fibrillation (HCC) Baseline history of atrial fibrillation Not on anticoagulation in setting of fall risk On metoprolol chronically Heart rate 90s to low 100s IV as needed metoprolol for systolic heart rate greater than 100 as needed Monitor Consult cardiology as appropriate  Parkinson's disease (HCC) Baseline Parkinson's disease on Sinemet Notable multiple recent falls Currently with left hip fracture Continue Sinemet Fall precautions PT OT evaluation      Advance Care Planning:   Code Status: Full Code   Consults: Ortho   Family Communication: Wife at the bedside   Severity of Illness: The appropriate patient status for this patient is INPATIENT. Inpatient status is judged to be reasonable and necessary in order to provide the required intensity of service to ensure the patient's safety. The  patient's presenting symptoms, physical exam findings, and initial radiographic and laboratory data in the context of their chronic comorbidities is felt to place them at high risk for further clinical deterioration. Furthermore, it is not anticipated that the patient will be medically stable for discharge from the hospital within 2 midnights of admission.   * I certify that at the point of admission it is my clinical judgment that the patient will require inpatient  hospital care spanning beyond 2 midnights from the point of admission due to high intensity of service, high risk for further deterioration and high frequency of surveillance required.*  Author: Floydene Flock, MD 04/22/2023 3:39 PM  For on call review [email or web address].

## 2023-04-22 NOTE — Op Note (Signed)
 04/22/2023  6:29 PM  Patient:   Daniel [name]  Pre-Op Diagnosis:   Displaced femoral neck fracture, left hip.  Post-Op Diagnosis:   Same.  Procedure:   Left hip bipolar hemiarthroplasty.  Surgeon:   Maryagnes Amos, MD  Assistant:   Horris Latino, PA-C  Anesthesia:   GET  Findings:   As above.  Complications:   None  EBL:   100 cc  Fluids:   1000 cc crystalloid  UOP:   None  TT:   None  Drains:   None  Closure:   Staples  Implants:   Biomet cemented Echo system with a #15 laterally offset cemented femoral stem, a 52 mm outer diameter shell, and a 28 mm head with a -6 mm neck adapter.  Brief Clinical Note:   The patient is an 83 year old male who sustained above-noted injury earlier this morning when he apparently lost his balance and fell in his home. He was brought to the emergency room where x-rays demonstrated the above-noted injury. The patient has been cleared medically and presents at this time for definitive management of the injury.  Procedure:   The patient was brought into the operating room and lain in the supine position. After adequate general endotracheal intubation and anesthesia was obtained, the patient was repositioned in the right lateral decubitus position and secured using a lateral hip positioner. The bony prominences along the lower part of his leg were properly padded and an axillary roll was utilized. The left hip and lower extremity were prepped with ChloroPrep solution before being draped sterilely. Preoperative antibiotics were administered. A timeout was performed to verify the appropriate surgical site.    A standard posterior approach to the hip was made through an approximately 4-5 inch incision. The incision was carried down through the subcutaneous tissues to expose the gluteal fascia and proximal end of the iliotibial band. These structures were split the length of the incision and the Charnley self-retaining hip retractor placed. The bursal  tissues were swept posteriorly to expose the short external rotators. The anterior border of the piriformis tendon was identified and this plane developed down through the capsule to enter the joint. Abundant fracture hematoma was suctioned. A flap of tissue was elevated off the posterior aspect of the femoral neck and greater trochanter and retracted posteriorly. This flap included the piriformis tendon, the short external rotators, and the posterior capsule. The femoral head was removed in its entirety, then taken to the back table where it was measured and found to be optimally replicated by a 52 mm head. The appropriate trial head was inserted and found to demonstrate an excellent suction fit.   Attention was directed to the femoral side. The femoral neck was recut 10-12 mm above the lesser trochanter using an oscillating saw. The piriformis fossa was debrided of soft tissues before the intramedullary canal was accessed through this point using a triple step reamer. The canal was reamed sequentially beginning with a #7 tapered reamer and progressing to a #17 tapered reamer. This provided excellent circumferential chatter. A box osteotome was used to establish version before the canal was broached sequentially beginning with a #12 broach and progressing to a #16 broach. This was left in place and several trial reductions performed using the standard lateral offset neck options as well as the -6 mm and -3 mm neck length options.   The femoral canal was prepared for cementing by using the bottle brush to gently rasped the inside of the  canal before irrigating it thoroughly using the extended tip on the jet lavage system. The large distal cement restrictor was inserted to the appropriate depth before the canal was packed with a Neo-Synephrine soaked vaginal sponge. Meanwhile, the cement was mixed on the back table.  When the cement was ready, the cement was injected into the femoral canal using the appropriate  pressurized technique. The permanent #15 lateral offset cemented femoral stem was inserted into place with care taken to maintain the appropriate version.   Once the cemented hardened, a repeat trial reduction was performed using the -6 mm neck length. The -6 mm neck length demonstrated excellent stability both in extension and external rotation as well as with flexion to 90 and internal rotation beyond 70. It also was stable in the position of sleep. The 52 mm outer diameter shell and the 28 mm head with the -6 mm neck adapter construct was put together on the back table before being impacted onto the stem of the femoral component. The Morse taper locking mechanism was verified using manual distraction before the head was relocated and the hip placed through a range of motion with the findings as described above.  The wound was copiously irrigated with sterile saline solution via the jet lavage system before the peri-incisional and pericapsular tissues were injected with a "cocktail" of 20 cc of Exparel, 30 cc of 0.5% Sensorcaine, 2 cc of Kenalog 40 (80 mg), and 30 mg of Toradol diluted out to 60 cc with normal saline to help with postoperative analgesia. The posterior flap was reapproximated to the posterior aspect of the greater trochanter using #2 Tycron interrupted sutures placed through drill holes. The iliotibial band was reapproximated using #1 Vicryl interrupted sutures before the gluteal fascia was closed using a running #0 Vicryl suture. At this point, 1 g of transexemic acid in 10 cc of normal saline was injected into the joint to help reduce postoperative bleeding. The subcutaneous tissues were closed in several layers using 2-0 Vicryl interrupted sutures before the skin was closed using staples. A sterile occlusive dressing was applied to the wound . The patient then was rolled back into the supine position on the hospital bed before being awakened, extubated, and returned to the recovery room in  satisfactory condition after tolerating the procedure well.

## 2023-04-22 NOTE — Anesthesia Procedure Notes (Signed)
 Procedure Name: Intubation Date/Time: 04/22/2023 4:51 PM  Performed by: Maryla Morrow., CRNAPre-anesthesia Checklist: Patient identified, Patient being monitored, Timeout performed, Emergency Drugs available and Suction available Patient Re-evaluated:Patient Re-evaluated prior to induction Oxygen Delivery Method: Circle system utilized Preoxygenation: Pre-oxygenation with 100% oxygen Induction Type: IV induction Ventilation: Mask ventilation without difficulty Laryngoscope Size: McGrath and 4 Grade View: Grade I Tube type: Oral Tube size: 7.0 mm Number of attempts: 1 Airway Equipment and Method: Stylet Placement Confirmation: ETT inserted through vocal cords under direct vision, positive ETCO2 and breath sounds checked- equal and bilateral Secured at: 24 cm Tube secured with: Tape Dental Injury: Teeth and Oropharynx as per pre-operative assessment

## 2023-04-22 NOTE — Assessment & Plan Note (Signed)
 Noted left hip fracture status post mechanical fall in setting of baseline Parkinson's disease Dr. Joice Lofts with orthopedic surgery aware of case Plan for operative repair Pain control PT OT evaluation Fall precautions Follow-up orthopedic recommendations

## 2023-04-23 ENCOUNTER — Encounter: Payer: Self-pay | Admitting: Surgery

## 2023-04-23 DIAGNOSIS — S72002A Fracture of unspecified part of neck of left femur, initial encounter for closed fracture: Secondary | ICD-10-CM | POA: Diagnosis not present

## 2023-04-23 LAB — COMPREHENSIVE METABOLIC PANEL WITH GFR
ALT: 19 U/L (ref 0–44)
AST: 29 U/L (ref 15–41)
Albumin: 3.3 g/dL — ABNORMAL LOW (ref 3.5–5.0)
Alkaline Phosphatase: 95 U/L (ref 38–126)
Anion gap: 10 (ref 5–15)
BUN: 46 mg/dL — ABNORMAL HIGH (ref 8–23)
CO2: 21 mmol/L — ABNORMAL LOW (ref 22–32)
Calcium: 8.4 mg/dL — ABNORMAL LOW (ref 8.9–10.3)
Chloride: 104 mmol/L (ref 98–111)
Creatinine, Ser: 1.35 mg/dL — ABNORMAL HIGH (ref 0.61–1.24)
GFR, Estimated: 52 mL/min — ABNORMAL LOW (ref >60)
Glucose, Bld: 181 mg/dL — ABNORMAL HIGH (ref 70–99)
Potassium: 4.6 mmol/L (ref 3.5–5.1)
Sodium: 135 mmol/L (ref 135–145)
Total Bilirubin: 0.9 mg/dL (ref 0.0–1.2)
Total Protein: 6.4 g/dL — ABNORMAL LOW (ref 6.5–8.1)

## 2023-04-23 LAB — CBC
HCT: 34 % — ABNORMAL LOW (ref 39.0–52.0)
Hemoglobin: 11.2 g/dL — ABNORMAL LOW (ref 13.0–17.0)
MCH: 27.9 pg (ref 26.0–34.0)
MCHC: 32.9 g/dL (ref 30.0–36.0)
MCV: 84.8 fL (ref 80.0–100.0)
Platelets: 238 10*3/uL (ref 150–400)
RBC: 4.01 MIL/uL — ABNORMAL LOW (ref 4.22–5.81)
RDW: 17.1 % — ABNORMAL HIGH (ref 11.5–15.5)
WBC: 15.5 10*3/uL — ABNORMAL HIGH (ref 4.0–10.5)
nRBC: 0 % (ref 0.0–0.2)

## 2023-04-23 MED ORDER — METOPROLOL TARTRATE 25 MG PO TABS
25.0000 mg | ORAL_TABLET | Freq: Two times a day (BID) | ORAL | Status: DC
  Administered 2023-04-24 – 2023-04-25 (×3): 25 mg via ORAL
  Filled 2023-04-23 (×4): qty 1

## 2023-04-23 MED ORDER — METOPROLOL SUCCINATE ER 25 MG PO TB24
25.0000 mg | ORAL_TABLET | Freq: Every day | ORAL | Status: DC
  Administered 2023-04-23: 25 mg via ORAL
  Filled 2023-04-23: qty 1

## 2023-04-23 MED ORDER — HYDROCODONE-ACETAMINOPHEN 5-325 MG PO TABS
1.0000 | ORAL_TABLET | ORAL | Status: DC | PRN
  Administered 2023-04-25: 1 via ORAL
  Filled 2023-04-23: qty 1

## 2023-04-23 MED ORDER — METOPROLOL TARTRATE 5 MG/5ML IV SOLN: 5.0000 mg | Freq: Once | INTRAVENOUS | Status: DC

## 2023-04-23 MED ORDER — MORPHINE SULFATE (PF) 2 MG/ML IV SOLN: 2.0000 mg | INTRAVENOUS | Status: DC | PRN

## 2023-04-23 NOTE — Evaluation (Signed)
 Physical Therapy Evaluation Patient Details Name: Daniel [name] MRN: [id number] DOB: 1940-06-19 Today's Date: 04/23/2023  History of Present Illness  Pt is an 83 y.o. male with medical history significant of atrial fibrillation, CAD, pacemaker, HTN, prostate cancer, dysphagia, Parkinson's disease, recurrent falls presenting with fall and left hip fracture. Now s/p L hip bipolar hemiarthroplasty, LLE WBAT, posterior total hip precautions.   Clinical Impression  Pt initially seen in the AM with resting found to be in the 130s to low 140s after obtaining history, PLOF, and discussing hip precautions.  Per MD PT eval held.  MD reached out in PM and stated pt ok to work with as long as HR remained <130 bpm.  Pt's HR remained primarily in the low 100s to occasional low 120s during the PM session with only one brief reading of 131 bpm.  Pt required heavy +2 assist with all functional tasks and was unable to remain in standing at the EOB without heavy assist to prevent posterior LOB.  Pt with seeming lack of B knee ext A/PROM as well with both pt and spouse stating that he has no known history of knee ROM deficits.  Pt has a history of falls and remains at a very high risk for future falls and will benefit from continued PT services upon discharge to safely address deficits listed in patient problem list for decreased caregiver assistance and eventual return to PLOF.            If plan is discharge home, recommend the following: Two people to help with walking and/or transfers;Two people to help with bathing/dressing/bathroom;Assistance with cooking/housework;Direct supervision/assist for medications management;Help with stairs or ramp for entrance;Assist for transportation   Can travel by private vehicle   No    Equipment Recommendations Other (comment) (TBD)  Recommendations for Other Services       Functional Status Assessment Patient has had a recent decline in their functional status and/or  demonstrates limited ability to make significant improvements in function in a reasonable and predictable amount of time     Precautions / Restrictions Precautions Precautions: Posterior Hip;Fall Precaution Booklet Issued: No Recall of Precautions/Restrictions: Impaired Precaution/Restrictions Comments: Pt able to recall 2/3 precautions after initial instruction Restrictions Weight Bearing Restrictions Per Provider Order: Yes LLE Weight Bearing Per Provider Order: Weight bearing as tolerated Other Position/Activity Restrictions: Watch HR      Mobility  Bed Mobility Overal bed mobility: Needs Assistance Bed Mobility: Supine to Sit, Rolling, Sit to Supine Rolling: Max assist, Used rails   Supine to sit: Max assist, +2 for physical assistance, HOB elevated, Used rails Sit to supine: Max assist, +2 for physical assistance   General bed mobility comments: heavy assist for BLE and trunk control with cues for sequencing    Transfers Overall transfer level: Needs assistance Equipment used: Rolling walker (2 wheels) Transfers: Sit to/from Stand Sit to Stand: From elevated surface, Mod assist, +2 physical assistance, Max assist           General transfer comment: VC for hand placement, heavy posterior lean    Ambulation/Gait               General Gait Details: unable  Stairs            Wheelchair Mobility     Tilt Bed    Modified Rankin (Stroke Patients Only)       Balance Overall balance assessment: Needs assistance, History of Falls Sitting-balance support: Bilateral upper extremity supported, Feet supported Sitting  balance-Leahy Scale: Fair Sitting balance - Comments: initial poor, improving to fair with shifting hips to improve B feet placement on floor Postural control: Posterior lean Standing balance support: Bilateral upper extremity supported, Reliant on assistive device for balance Standing balance-Leahy Scale: Poor Standing balance comment:  posterior lean with standing with constant assist needed to prevent posterior LOB                             Pertinent Vitals/Pain Pain Assessment Pain Assessment: No/denies pain    Home Living Family/patient expects to be discharged to:: Private residence Living Arrangements: Spouse/significant other Available Help at Discharge: Family;Available 24 hours/day Type of Home: House Home Access: Stairs to enter Entrance Stairs-Rails: Doctor, general practice of Steps: 4   Home Layout: One level Home Equipment: Shower seat - built Charity fundraiser (2 wheels);Rollator (4 wheels);BSC/3in1;Wheelchair - manual;Grab bars - tub/shower      Prior Function               Mobility Comments: Pt reports ambulating with rollator at baseline for limited household mobility, uses w/c for community needs, endorses 5-6 falls 2/2 LOB. No family present to verify. ADLs Comments: Pt reports being independent with ADL at baseline. No family present to verify.     Extremity/Trunk Assessment   Upper Extremity Assessment Upper Extremity Assessment: Generalized weakness    Lower Extremity Assessment Lower Extremity Assessment: Generalized weakness;LLE deficits/detail LLE Deficits / Details: s/p L hip hemi LLE Sensation: WNL LLE Coordination: decreased gross motor;decreased fine motor       Communication   Communication Communication: Impaired Factors Affecting Communication: Reduced clarity of speech;Hearing impaired    Cognition Arousal: Alert Behavior During Therapy: WFL for tasks assessed/performed                             Following commands: Impaired Following commands impaired: Follows one step commands with increased time     Cueing       General Comments General comments (skin integrity, edema, etc.): HR in Afib, up to 132 and 136 max briefly, generally in 100's to 120's Afib during session.    Exercises Other Exercises Other Exercises:  Pt/family educated in posterior THPs verbally and then during functional tasks   Assessment/Plan    PT Assessment Patient needs continued PT services  PT Problem List Decreased strength;Decreased range of motion;Decreased activity tolerance;Decreased balance;Decreased mobility;Decreased knowledge of use of DME;Decreased safety awareness;Decreased knowledge of precautions       PT Treatment Interventions DME instruction;Gait training;Stair training;Functional mobility training;Therapeutic activities;Therapeutic exercise;Balance training;Patient/family education    PT Goals (Current goals can be found in the Care Plan section)  Acute Rehab PT Goals Patient Stated Goal: Improved balance PT Goal Formulation: With patient Time For Goal Achievement: 05/06/23 Potential to Achieve Goals: Fair    Frequency 7X/week     Co-evaluation PT/OT/SLP Co-Evaluation/Treatment: Yes Reason for Co-Treatment: Complexity of the patient's impairments (multi-system involvement);To address functional/ADL transfers;For patient/therapist safety PT goals addressed during session: Mobility/safety with mobility;Balance;Proper use of DME OT goals addressed during session: ADL's and self-care;Proper use of Adaptive equipment and DME       AM-PAC PT "6 Clicks" Mobility  Outcome Measure Help needed turning from your back to your side while in a flat bed without using bedrails?: Total Help needed moving from lying on your back to sitting on the side of a flat bed without using bedrails?:  Total Help needed moving to and from a bed to a chair (including a wheelchair)?: Total Help needed standing up from a chair using your arms (e.g., wheelchair or bedside chair)?: Total Help needed to walk in hospital room?: Total Help needed climbing 3-5 steps with a railing? : Total 6 Click Score: 6    End of Session Equipment Utilized During Treatment: Gait belt Activity Tolerance: Patient tolerated treatment well Patient  left: in bed;with call bell/phone within reach;with bed alarm set;Other (comment) (Pt left with OT/nursing for hygiene) Nurse Communication: Mobility status;Weight bearing status;Precautions PT Visit Diagnosis: Unsteadiness on feet (R26.81);History of falling (Z91.81);Other abnormalities of gait and mobility (R26.89);Muscle weakness (generalized) (M62.81)    Time: 1000-1031 (and 14:15-14:37 with break secondary to increased HR) PT Time Calculation (min) (ACUTE ONLY): 31 min   Charges:   PT Evaluation $PT Eval Moderate Complexity: 1 Mod PT Treatments $Therapeutic Activity: 8-22 mins PT General Charges $$ ACUTE PT VISIT: 1 Visit    D. Elly Modena PT, DPT 04/23/23, 4:45 PM

## 2023-04-23 NOTE — Evaluation (Signed)
 Occupational Therapy Evaluation Patient Details Name: Daniel [name] MRN: [id number] DOB: 25-May-1940 Today's Date: 04/23/2023   History of Present Illness   83 y.o. male with medical history significant of atrial fibrillation, CAD, pacemaker, HTN, prostate cancer, dysphagia, Parkinson's disease, recurrent falls presenting with fall and left hip fracture. Now s/p L hip bipolar hemiarthroplasty, LLE WBAT, posterior total hip precautions.     Clinical Impressions Pt seen for OT evaluation this date, POD#1 from above surgery. MD cleared therapy to work with pt (keeping HR <130). Pt reports being independent in all ADLs prior to surgery and using a rollator for in home mobility. Several recent falls. Pt is eager to return to PLOF with improved safety and independence. Pt currently requires MAX assist for bed level LB dressing, bathing, and toileting, MAX A +2 for bed mobility, and MOD-MAX A +2 for STS x2 with heavy posterior lean. HR noted to be 132/136 briefly and further mobility efforts discontinued. In general HR in Afib in 100's-120's. Pt/spouse educated in posterior THPs and pt able to recall 2/3 posterior total hip precautions at end of session. Pt would benefit from additional instruction in posterior total hip precautions and how to implement, self care skills, falls prevention strategies, home/routines modifications, DME/AE for LB bathing and dressing tasks, compression stocking mgt strategies, and car transfer techniques.      If plan is discharge home, recommend the following:   Two people to help with walking and/or transfers;Assistance with cooking/housework;Assist for transportation;Direct supervision/assist for medications management;A lot of help with bathing/dressing/bathroom;Help with stairs or ramp for entrance     Functional Status Assessment   Patient has had a recent decline in their functional status and demonstrates the ability to make significant improvements in function in  a reasonable and predictable amount of time.     Equipment Recommendations   Other (comment) (defer)     Recommendations for Other Services         Precautions/Restrictions   Precautions Precautions: Posterior Hip;Fall Recall of Precautions/Restrictions: Impaired Precaution/Restrictions Comments: Pt able to recall 2/3 precautions after initial instruction Restrictions Weight Bearing Restrictions Per Provider Order: Yes LLE Weight Bearing Per Provider Order: Weight bearing as tolerated     Mobility Bed Mobility Overal bed mobility: Needs Assistance Bed Mobility: Supine to Sit, Rolling, Sit to Supine Rolling: Max assist, Used rails   Supine to sit: Max assist, +2 for physical assistance, HOB elevated, Used rails Sit to supine: Max assist, +2 for physical assistance        Transfers Overall transfer level: Needs assistance Equipment used: Rolling walker (2 wheels) Transfers: Sit to/from Stand Sit to Stand: From elevated surface, Mod assist, +2 physical assistance, Max assist           General transfer comment: VC for hand placement, heavy posterior lean      Balance Overall balance assessment: Needs assistance Sitting-balance support: Bilateral upper extremity supported, Feet supported Sitting balance-Leahy Scale: Fair Sitting balance - Comments: initial poor, improving to fair with shifting hips to improve B feet placement on floor Postural control: Posterior lean Standing balance support: Bilateral upper extremity supported, Reliant on assistive device for balance Standing balance-Leahy Scale: Poor Standing balance comment: posterior lean with standing                           ADL either performed or assessed with clinical judgement   ADL Overall ADL's : Needs assistance/impaired  Upper Body Dressing : Bed level;Maximal assistance Upper Body Dressing Details (indicate cue type and reason): gown Lower Body Dressing:  Bed level;Maximal assistance Lower Body Dressing Details (indicate cue type and reason): socks             Functional mobility during ADLs: Maximal assistance;Moderate assistance;+2 for physical assistance;Rolling walker (2 wheels);Cueing for sequencing       Vision         Perception         Praxis         Pertinent Vitals/Pain Pain Assessment Pain Assessment: No/denies pain     Extremity/Trunk Assessment Upper Extremity Assessment Upper Extremity Assessment: Generalized weakness   Lower Extremity Assessment Lower Extremity Assessment: Defer to PT evaluation;LLE deficits/detail;Generalized weakness (unable to reach full knee extension bilaterally) LLE Deficits / Details: s/p L hip hemi, LLE Coordination: decreased gross motor;decreased fine motor       Communication Communication Communication: Impaired Factors Affecting Communication: Reduced clarity of speech   Cognition Arousal: Alert Behavior During Therapy: WFL for tasks assessed/performed Cognition: No apparent impairments                               Following commands: Impaired Following commands impaired: Follows one step commands with increased time     Cueing  General Comments   Cueing Techniques: Verbal cues;Tactile cues  HR in Afib, up to 132 and 136 max briefly, generally in 100's to 120's Afib during session.   Exercises Other Exercises Other Exercises: Pt/family educated in posterior THPs, pt able to recall 2/3 at end of session   Shoulder Instructions      Home Living Family/patient expects to be discharged to:: Private residence Living Arrangements: Spouse/significant other Available Help at Discharge: Family;Available 24 hours/day Type of Home: House Home Access: Stairs to enter [employer] of Steps: 4 Entrance Stairs-Rails: Right;Left Home Layout: One level     Bathroom Shower/Tub: Walk-in shower         Home Equipment: Shower seat - built  Charity fundraiser (2 wheels);Rollator (4 wheels);BSC/3in1;Wheelchair - manual;Grab bars - tub/shower          Prior Functioning/Environment               Mobility Comments: Pt reports ambulating with rollator at baseline for limited household mobility, uses w/c for community needs, endorses several falls 2/2 LOB. No family present to verify. ADLs Comments: Pt reports being independent with ADL at baseline. No family present to verify.    OT Problem List: Decreased strength;Decreased coordination;Cardiopulmonary status limiting activity;Decreased range of motion;Decreased activity tolerance;Impaired balance (sitting and/or standing);Decreased safety awareness;Decreased knowledge of use of DME or AE;Decreased knowledge of precautions   OT Treatment/Interventions: Self-care/ADL training;Therapeutic exercise;Therapeutic activities;DME and/or AE instruction;Neuromuscular education;Patient/family education;Balance training      OT Goals(Current goals can be found in the care plan section)   Acute Rehab OT Goals Patient Stated Goal: get better OT Goal Formulation: With patient/family Time For Goal Achievement: 05/07/23 Potential to Achieve Goals: Good ADL Goals Pt Will Transfer to Toilet: with mod assist;bedside commode;ambulating (LRAD, maintaining posterior THPs) Pt Will Perform Toileting - Clothing Manipulation and hygiene: sitting/lateral leans;with min assist Additional ADL Goal #1: Pt will recall 3/3 posterior THPs and how to maintain during LB ADL with PRN VC.   OT Frequency:  Min 2X/week    Co-evaluation PT/OT/SLP Co-Evaluation/Treatment: Yes Reason for Co-Treatment: Complexity of the patient's impairments (multi-system involvement);To address functional/ADL transfers;For  patient/therapist safety PT goals addressed during session: Mobility/safety with mobility;Balance;Proper use of DME OT goals addressed during session: ADL's and self-care;Proper use of Adaptive equipment  and DME      AM-PAC OT "6 Clicks" Daily Activity     Outcome Measure Help from another person eating meals?: None Help from another person taking care of personal grooming?: A Little Help from another person toileting, which includes using toliet, bedpan, or urinal?: Total Help from another person bathing (including washing, rinsing, drying)?: A Lot Help from another person to put on and taking off regular upper body clothing?: A Lot Help from another person to put on and taking off regular lower body clothing?: A Lot 6 Click Score: 14   End of Session Equipment Utilized During Treatment: Rolling walker (2 wheels);Gait belt  Activity Tolerance: Patient tolerated treatment well Patient left: in bed;with call bell/phone within reach;with bed alarm set;with family/visitor present;Other (comment);with SCD's reapplied (pillows placed between legs to support proper joint positioning)  OT Visit Diagnosis: Other abnormalities of gait and mobility (R26.89);Repeated falls (R29.6);Muscle weakness (generalized) (M62.81)                Time: 1419-1440 OT Time Calculation (min): 21 min Charges:  OT General Charges $OT Visit: 1 Visit OT Evaluation $OT Eval Moderate Complexity: 1 Mod  Arman Filter., MPH, MS, OTR/L ascom [phone] 04/23/23, 3:56 PM

## 2023-04-23 NOTE — Progress Notes (Signed)
  Subjective: 1 Day Post-Op Procedure(s) (LRB): HEMIARTHROPLASTY (BIPOLAR) HIP, POSTERIOR APPROACH FOR FRACTURE (Left) Patient reports pain as mild.   Patient is well, and has had no acute complaints or problems PT and care management to assist with d/c planning. May need SNF following discharge for period of time. Negative for chest pain and shortness of breath Fever: no Gastrointestinal:Negative for nausea and vomiting  Objective: Vital signs in last 24 hours: Temp:  [97.8 F (36.6 C)-99.1 F (37.3 C)] 98.2 F (36.8 C) (04/01 0739) Pulse Rate:  [53-126] 117 (04/01 1112) Resp:  [10-32] 20 (04/01 0739) BP: (88-131)/(58-87) 117/64 (04/01 0739) SpO2:  [87 %-100 %] 99 % (04/01 0739) Weight:  [72.6 kg] 72.6 kg (03/31 1603)  Intake/Output from previous day:  Intake/Output Summary (Last 24 hours) at 04/23/2023 1422 Last data filed at 04/23/2023 1218 Gross per 24 hour  Intake 1867.5 ml  Output 100 ml  Net 1767.5 ml    Intake/Output this shift: Total I/O In: 321.3 [I.V.:121.3; IV Piggyback:200] Out: -   Labs: Recent Labs    04/22/23 1240 04/23/23 0516  HGB 13.4 11.2*   Recent Labs    04/22/23 1240 04/23/23 0516  WBC 9.4 15.5*  RBC 4.82 4.01*  HCT 41.6 34.0*  PLT 292 238   Recent Labs    04/22/23 1240 04/23/23 0516  NA 139 135  K 4.1 4.6  CL 103 104  CO2 25 21*  BUN 36* 46*  CREATININE 1.15 1.35*  GLUCOSE 101* 181*  CALCIUM 9.7 8.4*   No results for input(s): "LABPT", "INR" in the last 72 hours.   EXAM General - Patient is Alert, Appropriate, and Oriented Extremity - ABD soft Neurovascular intact Incision: dressing C/D/I No cellulitis present Compartment soft Dressing/Incision - clean, dry, no drainage noted to the left hip honeycomb dressing. Motor Function - intact, moving foot and toes well on exam.  Abdomen soft with intact bowel sounds.  Past Medical History:  Diagnosis Date   AF (atrial fibrillation) (HCC)    B12 deficiency    Coronary  artery disease    DH (dermatitis herpetiformis)    Dysphagia    Hypertension    Presence of permanent cardiac pacemaker    2006   Primary parkinsonism (HCC)    Prostate cancer (HCC) 2014    Assessment/Plan: 1 Day Post-Op Procedure(s) (LRB): HEMIARTHROPLASTY (BIPOLAR) HIP, POSTERIOR APPROACH FOR FRACTURE (Left) Principal Problem:   Hip fracture (HCC) Active Problems:   Parkinson's disease (HCC)   Fall   Atrial fibrillation (HCC)  Estimated body mass index is 20.55 kg/m as calculated from the following:   Height as of this encounter: 6\' 2"  (1.88 m).   Weight as of this encounter: 72.6 kg. Advance diet Up with therapy D/C IV fluids when tolerating po intake.  Up with therapy today, unable to work with PT this AM due to HR.  PT currently in room. Continue to work on BM. Care management to assist with discharge planning. HR 117 with history of A fib.  DVT Prophylaxis - Lovenox and TED hose Weight-Bearing as tolerated to left leg  J. Horris Latino, PA-C [facility] 04/23/2023, 2:22 PM

## 2023-04-23 NOTE — Plan of Care (Signed)

## 2023-04-23 NOTE — Plan of Care (Signed)
  Problem: Clinical Measurements: Goal: Ability to maintain clinical measurements within normal limits will improve Outcome: Progressing   Problem: Elimination: Goal: Will not experience complications related to bowel motility Outcome: Progressing   Problem: Pain Managment: Goal: General experience of comfort will improve and/or be controlled Outcome: Progressing   Problem: Safety: Goal: Ability to remain free from injury will improve Outcome: Progressing   Problem: Skin Integrity: Goal: Risk for impaired skin integrity will decrease Outcome: Progressing

## 2023-04-23 NOTE — Progress Notes (Signed)
  PROGRESS NOTE    [name]  JYN:[id number] DOB: 1940/05/18 DOA: 04/22/2023 PCP: Daniel Mina, MD  [id number]-[id number]  LOS: 1 day   Brief hospital course:   Assessment & Plan: Daniel [name] is a 83 y.o. male with medical history significant of atrial fibrillation, CAD, hypertension, dysphagia, Parkinson's disease, recurrent falls presenting with fall and left hip fracture.  Wife reports patient attempted to ambulate when he missed his walker subsequently falling on his left side.    * Hip fracture (HCC) S/p left HEMIARTHROPLASTY on 04/22/23 Noted left hip fracture status post mechanical fall in setting of baseline Parkinson's disease --Weight-Bearing as tolerated to left leg  --PT/OT  Recurrent Falls Noted multiple falls at home in the setting of Parkinson's disease.  Even though pt uses walker, he can fall backwards. Noted recent admission in the [facility] February 2024 for issues including, subacute left subdural hemorrhage with 0.3 cm rightward midline shift, small left hemopneumothorax, small left lower lobe pulmonary laceration, acute fractures of the left 5-10 ribs, some of which are comminuted and segmental-stable Fall precautions  PT/OT evaluation   [place] Atrial fibrillation (HCC) w RVR Not on anticoagulation in setting of fall risk Takes Toprol 12.5 mg daily PTA. --HR went into 130's this morning, due to missing home metop --order metop as Lopressor 25 mg BID with BP holding parameter.  Parkinson's disease (HCC) Baseline Parkinson's disease on Sinemet Notable multiple recent falls Currently with left hip fracture --cont home Sinemet   DVT prophylaxis: Lovenox SQ Code Status: Full code  Family Communication: son updated at bedside today Level of care: Telemetry Medical Dispo:   The patient is from: home Anticipated d/c is to: SNF rehab Anticipated d/c date is: whenever bed available   Subjective and Interval History:  Pain  controlled.   Objective: Vitals:   04/23/23 1100 04/23/23 1112 04/23/23 1640 04/23/23 1643  BP:   94/65 100/63  Pulse: (!) 121 (!) 117 (!) 124 97  Resp:   16   Temp:   98.5 F (36.9 C) 98 F (36.7 C)  TempSrc:   Oral   SpO2:   97% 99%  Weight:      Height:        Intake/Output Summary (Last 24 hours) at 04/23/2023 1806 Last data filed at 04/23/2023 1608 Gross per 24 hour  Intake 1834.5 ml  Output 50 ml  Net 1784.5 ml   Filed Weights   04/22/23 1603  Weight: 72.6 kg    Examination:   Constitutional: NAD, alert, oriented HEENT: conjunctivae and lids normal, EOMI CV: No cyanosis.   RESP: normal respiratory effort, on RA Neuro: II - XII grossly intact.   Psych: Normal mood and affect.     Data Reviewed: I have personally reviewed labs and imaging studies  Time spent: 35 minutes  Daniel Priestly, MD [facility] If 7PM-7AM, please contact night-coverage 04/23/2023, 6:06 PM

## 2023-04-23 NOTE — Progress Notes (Signed)
 PT Cancellation Note  Patient Details Name: Daniel [name] MRN: [id number] DOB: [birth year]   Cancelled Treatment:    Reason Eval/Treat Not Completed: Medical issues which prohibited therapy. Pt found at rest with HR on tele in the 130s to occasional low 140s, MD notified and PT evaluation held at this time.  Will attempt to see pt at a future date/time as medically appropriate.   Ovidio Hanger PT, DPT 04/23/23, 12:35 PM

## 2023-04-24 DIAGNOSIS — S72002A Fracture of unspecified part of neck of left femur, initial encounter for closed fracture: Secondary | ICD-10-CM | POA: Diagnosis not present

## 2023-04-24 LAB — CBC
HCT: 29.6 % — ABNORMAL LOW (ref 39.0–52.0)
Hemoglobin: 10 g/dL — ABNORMAL LOW (ref 13.0–17.0)
MCH: 27.8 pg (ref 26.0–34.0)
MCHC: 33.8 g/dL (ref 30.0–36.0)
MCV: 82.2 fL (ref 80.0–100.0)
Platelets: 227 10*3/uL (ref 150–400)
RBC: 3.6 MIL/uL — ABNORMAL LOW (ref 4.22–5.81)
RDW: 17.3 % — ABNORMAL HIGH (ref 11.5–15.5)
WBC: 20.6 10*3/uL — ABNORMAL HIGH (ref 4.0–10.5)
nRBC: 0 % (ref 0.0–0.2)

## 2023-04-24 LAB — BASIC METABOLIC PANEL WITH GFR
Anion gap: 9 (ref 5–15)
BUN: 61 mg/dL — ABNORMAL HIGH (ref 8–23)
CO2: 21 mmol/L — ABNORMAL LOW (ref 22–32)
Calcium: 8.4 mg/dL — ABNORMAL LOW (ref 8.9–10.3)
Chloride: 102 mmol/L (ref 98–111)
Creatinine, Ser: 1.2 mg/dL (ref 0.61–1.24)
GFR, Estimated: >60 mL/min (ref >60)
Glucose, Bld: 142 mg/dL — ABNORMAL HIGH (ref 70–99)
Potassium: 4.4 mmol/L (ref 3.5–5.1)
Sodium: 132 mmol/L — ABNORMAL LOW (ref 135–145)

## 2023-04-24 LAB — MAGNESIUM: Magnesium: 2.1 mg/dL (ref 1.7–2.4)

## 2023-04-24 NOTE — Progress Notes (Signed)
 PROGRESS NOTE    Daniel [name]   ZOX:[id number] DOB: March 28, 1940  DOA: 04/22/2023 Date of Service: 04/24/23 which is hospital day 2  PCP: Jerl Mina, MD    Hospital course / significant events:   HPI: Daniel [name] is a 83 y.o. male with medical history significant of atrial fibrillation, CAD, hypertension, dysphagia, Parkinson's disease, recurrent falls presenting with fall and left hip fracture.  Wife reports patient attempted to ambulate when he missed his walker subsequently falling on his left side.   03/31: admitted to hospitalist service, ortho consult, L hip repair 04/01: PT/OT, WBAT, SNF recs  04/02: pending SNF rehab placement     Consultants:  Orthopedics  Procedures/Surgeries: 04/22/23: Left hip bipolar hemiarthroplasty w/ Dr Joice Lofts      ASSESSMENT & PLAN:   Displaced femoral neck fracture, left hip  04/22/23: Left hip bipolar hemiarthroplasty w/ Dr Madelin Rear as tolerated to left leg  PT/OT Lovenox    Recurrent Falls Noted multiple falls at home in the setting of Parkinson's disease.  Even though pt uses walker, he can fall backwards. Noted recent admission in the [facility] system February 2024 for issues including, subacute left subdural hemorrhage with 0.3 cm rightward midline shift, small left hemopneumothorax, small left lower lobe pulmonary laceration, acute fractures of the left 5-10 ribs, some of which are comminuted and segmental-stable Fall precautions  PT/OT  Hold long term anticoagulation / antiplatelet    Permeant Atrial fibrillation  Episode here of brief RVR d/t beta blocker withdrawal / rebound tachycardia - resolved Not on anticoagulation in setting of fall risk Lopressor 25 mg BID with BP holding parameter.   Parkinson's disease cont home Sinemet    No concerns based on BMI: Body mass index is 20.55 kg/m.  Underweight - under 18  overweight - 25 to 29 obese - 30 or more Class 1 obesity: BMI of 30.0 to 34 Class 2  obesity: BMI of 35.0 to 39 Class 3 obesity: BMI of 40.0 to 49 Super Morbid Obesity: BMI 50-59 Super-super Morbid Obesity: BMI 60+ Significantly low or high BMI is associated with higher medical risk.  Weight management advised as adjunct to other disease management and risk reduction treatments    DVT prophylaxis: lovenox  IV fluids: no continuous IV fluids  Nutrition: cardiac Central lines / other devices: none  Code Status: FULL CODE ACP documentation reviewed:  none on file in [facility]  TOC needs: placement Medical barriers to dispo: none. Expected medical readiness for discharge once placement is arranged .              Subjective / Brief ROS:  Patient reports no concerns Denies CP/SOB.  Pain controlled.  Denies new weakness.  Tolerating diet.  Reports no concerns w/ urination/defecation.   Family Communication: none at this time     Objective Findings:  Vitals:   04/24/23 0619 04/24/23 0822 04/24/23 0823 04/24/23 1125  BP: 123/65 (!) 93/58 (!) 93/58 92/68  Pulse: 88 77 75 65  Resp: 17  17 18   Temp: (!) 97.5 F (36.4 C)  97.6 F (36.4 C) 97.7 F (36.5 C)  TempSrc:      SpO2: 94%  100% 100%  Weight:      Height:        Intake/Output Summary (Last 24 hours) at 04/24/2023 1549 Last data filed at 04/24/2023 0500 Gross per 24 hour  Intake 617 ml  Output 1350 ml  Net -733 ml   Filed Weights   04/22/23 1603  Weight: 72.6 kg    Examination:  Physical Exam Constitutional:      General: He is not in acute distress. Cardiovascular:     Rate and Rhythm: Normal rate and regular rhythm.  Pulmonary:     Effort: Pulmonary effort is normal.     Breath sounds: Normal breath sounds.  Neurological:     Mental Status: He is alert. Mental status is at baseline.  Psychiatric:        Mood and Affect: Mood normal.        Behavior: Behavior normal.          Scheduled Medications:   carbidopa-levodopa  1 tablet Oral QHS   carbidopa-levodopa  2 tablet  Oral TID   docusate sodium  100 mg Oral BID   enoxaparin (LOVENOX) injection  40 mg Subcutaneous Q24H   metoprolol tartrate  25 mg Oral BID    Continuous Infusions:   ceFAZolin (ANCEF) IV      PRN Medications:  acetaminophen, bisacodyl, diphenhydrAMINE, HYDROcodone-acetaminophen, magnesium hydroxide, metoCLOPramide **OR** metoCLOPramide (REGLAN) injection, metoprolol tartrate, morphine injection, ondansetron **OR** ondansetron (ZOFRAN) IV, sodium phosphate  Antimicrobials from admission:  Anti-infectives (From admission, onward)    Start     Dose/Rate Route Frequency Ordered Stop   04/22/23 2200  ceFAZolin (ANCEF) IVPB 2g/100 mL premix        2 g 200 mL/hr over 30 Minutes Intravenous Every 6 hours 04/22/23 2055 04/23/23 0438   04/22/23 1530  ceFAZolin (ANCEF) IVPB 2g/100 mL premix        2 g 200 mL/hr over 30 Minutes Intravenous 30 min pre-op 04/22/23 1532             Data Reviewed:  I have personally reviewed the following...  CBC: Recent Labs  Lab 04/22/23 1240 04/23/23 0516 04/24/23 0218  WBC 9.4 15.5* 20.6*  HGB 13.4 11.2* 10.0*  HCT 41.6 34.0* 29.6*  MCV 86.3 84.8 82.2  PLT 292 238 227   Basic Metabolic Panel: Recent Labs  Lab 04/22/23 1240 04/23/23 0516 04/24/23 0218  NA 139 135 132*  K 4.1 4.6 4.4  CL 103 104 102  CO2 25 21* 21*  GLUCOSE 101* 181* 142*  BUN 36* 46* 61*  CREATININE 1.15 1.35* 1.20  CALCIUM 9.7 8.4* 8.4*  MG  --   --  2.1   GFR: Estimated Creatinine Clearance: 48.7 mL/min (by C-G formula based on SCr of 1.2 mg/dL). Liver Function Tests: Recent Labs  Lab 04/23/23 0516  AST 29  ALT 19  ALKPHOS 95  BILITOT 0.9  PROT 6.4*  ALBUMIN 3.3*   No results for input(s): "LIPASE", "AMYLASE" in the last 168 hours. No results for input(s): "AMMONIA" in the last 168 hours. Coagulation Profile: No results for input(s): "INR", "PROTIME" in the last 168 hours. Cardiac Enzymes: No results for input(s): "CKTOTAL", "CKMB", "CKMBINDEX",  "TROPONINI" in the last 168 hours. BNP (last 3 results) No results for input(s): "PROBNP" in the last 8760 hours. HbA1C: No results for input(s): "HGBA1C" in the last 72 hours. CBG: No results for input(s): "GLUCAP" in the last 168 hours. Lipid Profile: No results for input(s): "CHOL", "HDL", "LDLCALC", "TRIG", "CHOLHDL", "LDLDIRECT" in the last 72 hours. Thyroid Function Tests: No results for input(s): "TSH", "T4TOTAL", "FREET4", "T3FREE", "THYROIDAB" in the last 72 hours. Anemia Panel: No results for input(s): "VITAMINB12", "FOLATE", "FERRITIN", "TIBC", "IRON", "RETICCTPCT" in the last 72 hours. Most Recent Urinalysis On File:  No results found for: "COLORURINE", "APPEARANCEUR", "LABSPEC", "PHURINE", "GLUCOSEU", "HGBUR", "BILIRUBINUR", "  KETONESUR", "PROTEINUR", "UROBILINOGEN", "NITRITE", "LEUKOCYTESUR" Sepsis Labs: @LABRCNTIP (procalcitonin:4,lacticidven:4) Microbiology: No results found for this or any previous visit (from the past 240 hours).    Radiology Studies last 3 days: DG HIP UNILAT W OR W/O PELVIS 2-3 VIEWS LEFT Result Date: 04/22/2023 CLINICAL DATA:  [id number] Status post hip hemiarthroplasty [id number] EXAM: DG HIP (WITH OR WITHOUT PELVIS) 2-3V LEFT COMPARISON:  X-ray pelvis 04/22/2023. FINDINGS: Status post total left hip arthroplasty. No radiographic findings suggest surgical hardware complication. Severe degenerative changes of the right hip. There is no evidence of arthropathy or other focal bone abnormality. Overlying left hip skin staples. Pain soft tissue edema consistent with postsurgical changes. IMPRESSION: Status post total left hip arthroplasty. Electronically Signed   By: Tish Frederickson M.D.   On: 04/22/2023 22:23   DG Hip Unilat W or Wo Pelvis 2-3 Views Left Result Date: 04/22/2023 CLINICAL DATA:  Fall and left hip pain. EXAM: DG HIP (WITH OR WITHOUT PELVIS) 2-3V LEFT COMPARISON:  None Available. FINDINGS: Mildly displaced and impacted fracture of the left femoral  neck. No dislocation. The bones are osteopenic. The soft tissues are unremarkable. IMPRESSION: Mildly displaced and impacted fracture of the left femoral neck. Electronically Signed   By: Elgie Collard M.D.   On: 04/22/2023 14:55   DG Chest Portable 1 View Result Date: 04/22/2023 CLINICAL DATA:  Fall, hip fracture EXAM: PORTABLE CHEST 1 VIEW COMPARISON:  CT chest 03/16/2023 FINDINGS: Dual lead pacer noted. The patient is rotated to the left on today's radiograph, reducing diagnostic sensitivity and specificity. Low lung volumes are present, causing crowding of the pulmonary vasculature. The patient had multiple acute left lower posterior rib fractures on the CT from 03/16/2023, these are only faintly appreciable on today's chest radiograph. The lungs appear clear. Heart size within normal limits. IMPRESSION: 1. Low lung volumes, causing crowding of the pulmonary vasculature. 2. The patient had multiple acute left lower posterior rib fractures on the CT from 03/16/2023, these are only faintly appreciable on today's chest radiograph. Electronically Signed   By: Gaylyn Rong M.D.   On: 04/22/2023 14:55         Sunnie Nielsen, DO [facility] 04/24/2023, 3:49 PM    Dictation software may have been used to generate the above note. Typos may occur and escape review in typed/dictated notes. Please contact Dr Lyn Hollingshead directly for clarity if needed.  Staff may message me via secure chat in Epic  but this may not receive an immediate response,  please page me for urgent matters!  If 7PM-7AM, please contact night coverage [email or web address]

## 2023-04-24 NOTE — Plan of Care (Signed)
  Problem: Clinical Measurements: Goal: Diagnostic test results will improve Outcome: Progressing Goal: Respiratory complications will improve Outcome: Progressing Goal: Cardiovascular complication will be avoided Outcome: Progressing   Problem: Safety: Goal: Ability to remain free from injury will improve Outcome: Progressing

## 2023-04-24 NOTE — TOC Progression Note (Signed)
 Transition of Care [facility]) - Progression Note    Patient Details  Name: Daniel [name] MRN: [id number] Date of Birth: 1940-09-11  Transition of [facility] CM/SW Contact  Marlowe Sax, RN Phone Number: 04/24/2023, 11:20 AM  Clinical Narrative:    Spoke with the patient and he is agreeable to a bed search to go to [facility], PASSR obtained, FL2 complete, bedsearch sent   Expected Discharge Plan: Skilled Nursing Facility Barriers to Discharge: Insurance Authorization, SNF Pending bed offer  Expected Discharge Plan and Services   Discharge Planning Services: CM Consult   Living arrangements for the past 2 months: Single Family Home                 DME Arranged: N/A DME Agency: NA       HH Arranged: NA           Social Determinants of Health (SDOH) Interventions SDOH Screenings   Food Insecurity: No Food Insecurity (04/22/2023)  Housing: Low Risk  (04/22/2023)  Transportation Needs: No Transportation Needs (04/22/2023)  Utilities: Not At Risk (04/22/2023)  Financial Resource Strain: Low Risk  (02/26/2023)   Received from [facility]  Tobacco Use: Low Risk  (04/22/2023)    Readmission Risk Interventions     No data to display

## 2023-04-24 NOTE — Plan of Care (Signed)

## 2023-04-24 NOTE — NC FL2 (Signed)
 [place] MEDICAID FL2 LEVEL OF CARE FORM     IDENTIFICATION  Patient Name: Daniel [name] Birthdate: 06-07-1940 Sex: male Admission Date (Current Location): 04/22/2023  [place] and [place] Number:  Chiropodist and Address:  [facility], [address] [place], [place] [address]      Provider Number: [id number]  Attending Physician Name and Address:  Sunnie Nielsen, DO  Relative Name and Phone Number:  [name]  [facility]  Emergency Contact  [phone]  [address]  [place] [address]    Current Level of Care: Hospital Recommended Level of Care: Skilled Nursing Facility Prior Approval Number:    Date Approved/Denied:   PASRR Number: [id number]  Discharge Plan: SNF    Current Diagnoses: Patient Active Problem List   Diagnosis Date Noted   Hip fracture (HCC) 04/22/2023   Parkinson's disease (HCC) 04/22/2023   Fall 04/22/2023   Atrial fibrillation (HCC) 04/22/2023    Orientation RESPIRATION BLADDER Height & Weight     Self, Time, Situation, Place  Normal (Heart Healthy) Incontinent Weight: 72.6 kg Height:  6\' 2"  (188 cm)  BEHAVIORAL SYMPTOMS/MOOD NEUROLOGICAL BOWEL NUTRITION STATUS      Incontinent Diet  AMBULATORY STATUS COMMUNICATION OF NEEDS Skin   Extensive Assist Verbally Normal, Surgical wounds                       Personal Care Assistance Level of Assistance  Bathing, Dressing, Feeding Bathing Assistance: Limited assistance Feeding assistance: Limited assistance Dressing Assistance: Maximum assistance     Functional Limitations Info  Sight, Speech, Hearing Sight Info: Adequate Hearing Info: Adequate Speech Info: Adequate    SPECIAL CARE FACTORS FREQUENCY  PT (By licensed PT), OT (By licensed OT)     PT Frequency: 5 times per week OT Frequency: 5 times per week            Contractures Contractures Info: Not present    Additional Factors Info  Code Status, Allergies Code Status Info:  full code Allergies Info: Clindamycin/lincomycin, Clindamycin, Doxycycline, Hydrocodone-acetaminophen, Levaquin (Levofloxacin)           Current Medications (04/24/2023):  This is the current hospital active medication list Current Facility-Administered Medications  Medication Dose Route Frequency Provider Last Rate Last Admin   acetaminophen (TYLENOL) tablet 325-650 mg  325-650 mg Oral Q6H PRN Poggi, Excell Seltzer, MD       bisacodyl (DULCOLAX) suppository 10 mg  10 mg Rectal Daily PRN Poggi, Excell Seltzer, MD       carbidopa-levodopa (SINEMET CR) 50-200 MG per tablet controlled release 1 tablet  1 tablet Oral QHS Poggi, Excell Seltzer, MD   1 tablet at 04/23/23 2113   carbidopa-levodopa (SINEMET IR) 25-100 MG per tablet immediate release 2 tablet  2 tablet Oral TID Christena Flake, MD   2 tablet at 04/24/23 0820   ceFAZolin (ANCEF) IVPB 2g/100 mL premix  2 g Intravenous 30 min Pre-Op Poggi, Excell Seltzer, MD       diphenhydrAMINE (BENADRYL) 12.5 MG/5ML elixir 12.5-25 mg  12.5-25 mg Oral Q4H PRN Poggi, Excell Seltzer, MD       docusate sodium (COLACE) capsule 100 mg  100 mg Oral BID Poggi, Excell Seltzer, MD   100 mg at 04/24/23 0820   enoxaparin (LOVENOX) injection 40 mg  40 mg Subcutaneous Q24H Poggi, Excell Seltzer, MD   40 mg at 04/24/23 0820   HYDROcodone-acetaminophen (NORCO/VICODIN) 5-325 MG per tablet 1-2 tablet  1-2 tablet  Oral Q4H PRN Darlin Priestly, MD       magnesium hydroxide (MILK OF MAGNESIA) suspension 30 mL  30 mL Oral Daily PRN Poggi, Excell Seltzer, MD       metoCLOPramide (REGLAN) tablet 5-10 mg  5-10 mg Oral Q8H PRN Poggi, Excell Seltzer, MD       Or   metoCLOPramide (REGLAN) injection 5-10 mg  5-10 mg Intravenous Q8H PRN Poggi, Excell Seltzer, MD       metoprolol tartrate (LOPRESSOR) injection 5 mg  5 mg Intravenous Q5 min PRN Poggi, Excell Seltzer, MD       metoprolol tartrate (LOPRESSOR) tablet 25 mg  25 mg Oral BID Darlin Priestly, MD       morphine (PF) 2 MG/ML injection 2 mg  2 mg Intravenous Q4H PRN Darlin Priestly, MD       ondansetron [facility]) tablet 4 mg  4 mg  Oral Q6H PRN Poggi, Excell Seltzer, MD       Or   ondansetron (ZOFRAN) injection 4 mg  4 mg Intravenous Q6H PRN Poggi, Excell Seltzer, MD       sodium phosphate (FLEET) enema 1 enema  1 enema Rectal Once PRN Poggi, Excell Seltzer, MD         Discharge Medications: Please see discharge summary for a list of discharge medications.  Relevant Imaging Results:  Relevant Lab Results:   Additional Information SS# [id number]  Marlowe Sax, RN

## 2023-04-24 NOTE — Progress Notes (Signed)
 Physical Therapy Treatment Patient Details Name: Daniel [name] MRN: [id number] DOB: [birth year] Today's Date: 04/24/2023   History of Present Illness Pt is an 83 y.o. male with medical history significant of atrial fibrillation, CAD, pacemaker, HTN, prostate cancer, dysphagia, Parkinson's disease, recurrent falls presenting with fall and left hip fracture. Now s/p L hip bipolar hemiarthroplasty, LLE WBAT, posterior total hip precautions.    PT Comments  Pt was long sitting in bed, A and O x 2. Agrees to session and remains cooperative and pleasant throughout. Pt did not require +2 assist this session however does required max assist of one for all standing activity. He presents with severe posterior push in standing. Parkinsonian shuffling steps form EOB to recliner. Once in recliner, author reviewed HEP and pt perform several exercises in chair prior to conclusion of session. Overall pt is progressing however seems to be far from his baseline abilities. DC recs remain appropriate to maximize independence and safety with all ADLs.    If plan is discharge home, recommend the following: A lot of help with walking and/or transfers;A lot of help with bathing/dressing/bathroom;Assistance with cooking/housework;Assistance with feeding;Direct supervision/assist for medications management;Direct supervision/assist for financial management;Assist for transportation;Help with stairs or ramp for entrance     Equipment Recommendations  Other (comment) (Defer to next level of care)       Precautions / Restrictions Precautions Precautions: Posterior Hip;Fall Precaution Booklet Issued: Yes (comment) Recall of Precautions/Restrictions: Impaired Restrictions Weight Bearing Restrictions Per Provider Order: Yes LLE Weight Bearing Per Provider Order: Weight bearing as tolerated Other Position/Activity Restrictions: Watch HR     Mobility  Bed Mobility Overal bed mobility: Needs Assistance Bed Mobility: Supine  to Sit, Rolling Rolling: Max assist, Used rails  General bed mobility comments: Increased time to perform with vcs for sequencing and technique improvements.    Transfers Overall transfer level: Needs assistance Equipment used: Rolling walker (2 wheels) Transfers: Sit to/from Stand, Bed to chair/wheelchair/BSC Sit to Stand: From elevated surface, Mod assist, Max assist  General transfer comment: Pt was able to stand 1 x EOB prior to standpivot to recliner. parkinsonian step quality with severe posterior push in standing.    Ambulation/Gait  General Gait Details: took a few steps from EOB to recliner with shuffling/parkinsonian step pattern. severe posterior lean with poor ability to correct even with vcing   Balance Overall balance assessment: Needs assistance, History of Falls Sitting-balance support: Bilateral upper extremity supported, Feet supported Sitting balance-Leahy Scale: Fair Sitting balance - Comments: initial poor, improving to fair with shifting hips to improve B feet placement on floor   Standing balance support: Bilateral upper extremity supported, Reliant on assistive device for balance Standing balance-Leahy Scale: Poor Standing balance comment: posterior lean with standing with constant assist needed to prevent posterior LOB     Communication Communication Communication: Impaired  Cognition Arousal: Alert Behavior During Therapy: WFL for tasks assessed/performed   PT - Cognitive impairments: No apparent impairments    PT - Cognition Comments: Pt is A and O x 3 Following commands: Intact Following commands impaired: Follows one step commands with increased time    Cueing Cueing Techniques: Verbal cues, Tactile cues         Pertinent Vitals/Pain Pain Assessment Pain Assessment: 0-10 Pain Score: 4  Pain Descriptors / Indicators: Discomfort Pain Intervention(s): Limited activity within patient's tolerance, Monitored during session, Premedicated before  session, Repositioned     PT Goals (current goals can now be found in the care plan section) Acute Rehab  PT Goals Patient Stated Goal: Improved balance Progress towards PT goals: Progressing toward goals    Frequency    7X/week           Co-evaluation     PT goals addressed during session: Mobility/safety with mobility;Balance;Proper use of DME;Strengthening/ROM        AM-PAC PT "6 Clicks" Mobility   Outcome Measure  Help needed turning from your back to your side while in a flat bed without using bedrails?: A Lot Help needed moving from lying on your back to sitting on the side of a flat bed without using bedrails?: A Lot Help needed moving to and from a bed to a chair (including a wheelchair)?: A Lot Help needed standing up from a chair using your arms (e.g., wheelchair or bedside chair)?: A Lot Help needed to walk in hospital room?: A Lot Help needed climbing 3-5 steps with a railing? : Total 6 Click Score: 11    End of Session Equipment Utilized During Treatment: Gait belt Activity Tolerance: Patient tolerated treatment well Patient left: in chair;with call bell/phone within reach;with chair alarm set Nurse Communication: Mobility status;Weight bearing status;Precautions PT Visit Diagnosis: Unsteadiness on feet (R26.81);History of falling (Z91.81);Other abnormalities of gait and mobility (R26.89);Muscle weakness (generalized) (M62.81)     Time: 1010-1025 PT Time Calculation (min) (ACUTE ONLY): 15 min  Charges:    $Therapeutic Activity: 8-22 mins PT General Charges $$ ACUTE PT VISIT: 1 Visit                    Jetta Lout PTA 04/24/23, 11:51 AM

## 2023-04-24 NOTE — Hospital Course (Addendum)
 Hospital course / significant events:   HPI: Daniel [name] is a 83 y.o. male with medical history significant of atrial fibrillation, CAD, hypertension, dysphagia, Parkinson's disease, recurrent falls presenting with fall and left hip fracture.  Wife reports patient attempted to ambulate when he missed his walker subsequently falling on his left side.   03/31: admitted to hospitalist service, ortho consult, L hip repair 04/01: PT/OT, WBAT, recs for SNF  04/02-04/03: pending SNF rehab placement 04/04: remains stable, discharge to [facility]    Consultants:  Orthopedics  Procedures/Surgeries: 04/22/23: Left hip bipolar hemiarthroplasty w/ Dr Joice Lofts      ASSESSMENT & PLAN:   Displaced femoral neck fracture, left hip  04/22/23: Left hip bipolar hemiarthroplasty w/ Dr Madelin Rear as tolerated to left leg  PT/OT Following discharge continue Lovenox daily for 14 days. Follow-up with [facility] in 10-14 days postop for staple removal (no later than 05/06/23)   Recurrent Falls Noted multiple falls at home in the setting of Parkinson's disease.  Even though pt uses walker, he can fall backwards. Noted recent admission in the [facility] system February 2024 for issues including, subacute left subdural hemorrhage with 0.3 cm rightward midline shift, small left hemopneumothorax, small left lower lobe pulmonary laceration, acute fractures of the left 5-10 ribs, some of which are comminuted and segmental-stable Fall precautions  PT/OT  Hold long term anticoagulation / antiplatelet - ok for DVT ppx as above    Permeant Atrial fibrillation  Episode here of brief RVR d/t beta blocker withdrawal / rebound tachycardia - resolved Not on anticoagulation in setting of fall risk Continue beta blocker    Parkinson's disease Hospital Delirium - 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  Underweight - under 18  overweight - 25 to 29 obese - 30 or more Class 1 obesity: BMI of 30.0 to 34 Class 2 obesity: BMI of 35.0 to 39 Class 3 obesity: BMI of 40.0 to 49 Super Morbid Obesity: BMI 50-59 Super-super Morbid Obesity: BMI 60+ Significantly low or high BMI is associated with higher medical risk.  Weight management advised as adjunct to other disease management and risk reduction treatments    DVT prophylaxis: lovenox  IV fluids: no continuous IV fluids  Nutrition: cardiac Central lines / other devices: none  Code Status: FULL CODE ACP documentation reviewed:  none on file in [facility]CA  TOC needs: placement Medical barriers to dispo: none.

## 2023-04-24 NOTE — Progress Notes (Signed)
  Subjective: 2 Days Post-Op Procedure(s) (LRB): HEMIARTHROPLASTY (BIPOLAR) HIP, POSTERIOR APPROACH FOR FRACTURE (Left) Patient reports pain as mild.   Patient is well, and has had no acute complaints or problems PT and care management to assist with d/c planning. May need SNF following discharge for period of time. Negative for chest pain and shortness of breath Fever: no Gastrointestinal:Negative for nausea and vomiting  Objective: Vital signs in last 24 hours: Temp:  [97.5 F (36.4 C)-98.5 F (36.9 C)] 97.5 F (36.4 C) (04/02 0619) Pulse Rate:  [78-124] 88 (04/02 0619) Resp:  [16-20] 17 (04/02 0619) BP: (94-123)/(62-65) 123/65 (04/02 0619) SpO2:  [94 %-99 %] 94 % (04/02 0619)  Intake/Output from previous day:  Intake/Output Summary (Last 24 hours) at 04/24/2023 0738 Last data filed at 04/24/2023 0500 Gross per 24 hour  Intake 938.25 ml  Output 1350 ml  Net -411.75 ml    Intake/Output this shift: No intake/output data recorded.  Labs: Recent Labs    04/22/23 1240 04/23/23 0516 04/24/23 0218  HGB 13.4 11.2* 10.0*   Recent Labs    04/23/23 0516 04/24/23 0218  WBC 15.5* 20.6*  RBC 4.01* 3.60*  HCT 34.0* 29.6*  PLT 238 227   Recent Labs    04/23/23 0516 04/24/23 0218  NA 135 132*  K 4.6 4.4  CL 104 102  CO2 21* 21*  BUN 46* 61*  CREATININE 1.35* 1.20  GLUCOSE 181* 142*  CALCIUM 8.4* 8.4*   No results for input(s): "LABPT", "INR" in the last 72 hours.   EXAM General - Patient is Alert, Appropriate, and Oriented Extremity - ABD soft Neurovascular intact Incision: dressing C/D/I No cellulitis present Compartment soft Dressing/Incision - clean, dry, no drainage noted to the left hip honeycomb dressing. Motor Function - intact, moving foot and toes well on exam.  Abdomen soft with intact bowel sounds.  Past Medical History:  Diagnosis Date   AF (atrial fibrillation) (HCC)    B12 deficiency    Coronary artery disease    DH (dermatitis  herpetiformis)    Dysphagia    Hypertension    Presence of permanent cardiac pacemaker    2006   Primary parkinsonism (HCC)    Prostate cancer (HCC) 2014    Assessment/Plan: 2 Days Post-Op Procedure(s) (LRB): HEMIARTHROPLASTY (BIPOLAR) HIP, POSTERIOR APPROACH FOR FRACTURE (Left) Principal Problem:   Hip fracture (HCC) Active Problems:   Parkinson's disease (HCC)   Fall   Atrial fibrillation (HCC)  Estimated body mass index is 20.55 kg/m as calculated from the following:   Height as of this encounter: 6\' 2"  (1.88 m).   Weight as of this encounter: 72.6 kg. Advance diet Up with therapy D/C IV fluids when tolerating po intake.  Labs and vitals reviewed, WBC up to 20.6, no signs of infection to the left hip, continue to monitor. Continue to work on BM. Care management to assist with discharge planning. HR 88 this AM.  DVT Prophylaxis - Lovenox and TED hose Weight-Bearing as tolerated to left leg  J. Horris Latino, PA-C [facility] 04/24/2023, 7:38 AM

## 2023-04-25 DIAGNOSIS — S72002A Fracture of unspecified part of neck of left femur, initial encounter for closed fracture: Secondary | ICD-10-CM | POA: Diagnosis not present

## 2023-04-25 LAB — CBC WITH DIFFERENTIAL/PLATELET
Abs Immature Granulocytes: 0.09 10*3/uL — ABNORMAL HIGH (ref 0.00–0.07)
Basophils Absolute: 0 10*3/uL (ref 0.0–0.1)
Basophils Relative: 0 %
Eosinophils Absolute: 0 10*3/uL (ref 0.0–0.5)
Eosinophils Relative: 0 %
HCT: 30 % — ABNORMAL LOW (ref 39.0–52.0)
Hemoglobin: 10.2 g/dL — ABNORMAL LOW (ref 13.0–17.0)
Immature Granulocytes: 1 %
Lymphocytes Relative: 5 %
Lymphs Abs: 0.7 10*3/uL (ref 0.7–4.0)
MCH: 28.3 pg (ref 26.0–34.0)
MCHC: 34 g/dL (ref 30.0–36.0)
MCV: 83.1 fL (ref 80.0–100.0)
Monocytes Absolute: 1 10*3/uL (ref 0.1–1.0)
Monocytes Relative: 7 %
Neutro Abs: 12.2 10*3/uL — ABNORMAL HIGH (ref 1.7–7.7)
Neutrophils Relative %: 87 %
Platelets: 242 10*3/uL (ref 150–400)
RBC: 3.61 MIL/uL — ABNORMAL LOW (ref 4.22–5.81)
RDW: 17.2 % — ABNORMAL HIGH (ref 11.5–15.5)
WBC: 14 10*3/uL — ABNORMAL HIGH (ref 4.0–10.5)
nRBC: 0 % (ref 0.0–0.2)

## 2023-04-25 LAB — BASIC METABOLIC PANEL WITH GFR
Anion gap: 7 (ref 5–15)
BUN: 53 mg/dL — ABNORMAL HIGH (ref 8–23)
CO2: 24 mmol/L (ref 22–32)
Calcium: 8.7 mg/dL — ABNORMAL LOW (ref 8.9–10.3)
Chloride: 105 mmol/L (ref 98–111)
Creatinine, Ser: 1.07 mg/dL (ref 0.61–1.24)
GFR, Estimated: >60 mL/min (ref >60)
Glucose, Bld: 127 mg/dL — ABNORMAL HIGH (ref 70–99)
Potassium: 4.5 mmol/L (ref 3.5–5.1)
Sodium: 136 mmol/L (ref 135–145)

## 2023-04-25 LAB — MAGNESIUM: Magnesium: 2.2 mg/dL (ref 1.7–2.4)

## 2023-04-25 MED ORDER — HYDROCODONE-ACETAMINOPHEN 5-325 MG PO TABS: 1.0000 | ORAL_TABLET | Freq: Four times a day (QID) | ORAL | 0 refills | Status: DC | PRN

## 2023-04-25 MED ORDER — LORAZEPAM 2 MG/ML IJ SOLN: 1.0000 mg | INTRAMUSCULAR | Status: DC | PRN

## 2023-04-25 MED ORDER — ENOXAPARIN SODIUM 40 MG/0.4ML IJ SOSY: 40.0000 mg | PREFILLED_SYRINGE | INTRAMUSCULAR | 0 refills | Status: DC

## 2023-04-25 MED ORDER — QUETIAPINE FUMARATE 25 MG PO TABS
25.0000 mg | ORAL_TABLET | Freq: Two times a day (BID) | ORAL | Status: DC
  Administered 2023-04-25 (×2): 25 mg via ORAL
  Filled 2023-04-25 (×2): qty 1

## 2023-04-25 NOTE — Progress Notes (Signed)
 PROGRESS NOTE    Daniel [name]   ZOX:[id number] DOB: 1940-12-13  DOA: 04/22/2023 Date of Service: 04/25/23 which is hospital day 3  PCP: Jerl Mina, MD    Hospital course / significant events:   HPI: Daniel [name] is a 83 y.o. male with medical history significant of atrial fibrillation, CAD, hypertension, dysphagia, Parkinson's disease, recurrent falls presenting with fall and left hip fracture.  Wife reports patient attempted to ambulate when he missed his walker subsequently falling on his left side.   03/31: admitted to hospitalist service, ortho consult, L hip repair 04/01: PT/OT, WBAT, SNF recs  04/02: pending SNF rehab placement 04/03: auth approved for [facility], can go tomorrow     Consultants:  Orthopedics  Procedures/Surgeries: 04/22/23: Left hip bipolar hemiarthroplasty w/ Dr Joice Lofts      ASSESSMENT & PLAN:   Displaced femoral neck fracture, left hip  04/22/23: Left hip bipolar hemiarthroplasty w/ Dr Madelin Rear as tolerated to left leg  PT/OT Following discharge continue Lovenox daily for 14 days. Follow-up with [facility] in 10-14 days for staple removal   Recurrent Falls Noted multiple falls at home in the setting of Parkinson's disease.  Even though pt uses walker, he can fall backwards. Noted recent admission in the [facility] system February 2024 for issues including, subacute left subdural hemorrhage with 0.3 cm rightward midline shift, small left hemopneumothorax, small left lower lobe pulmonary laceration, acute fractures of the left 5-10 ribs, some of which are comminuted and segmental-stable Fall precautions  PT/OT  Hold long term anticoagulation / antiplatelet    Permeant Atrial fibrillation  Episode here of brief RVR d/t beta blocker withdrawal / rebound tachycardia - resolved Not on anticoagulation in setting of fall risk Lopressor 25 mg BID with BP holding parameter.   Parkinson's disease cont home Sinemet    No concerns based  on BMI: Body mass index is 20.55 kg/m.  Underweight - under 18  overweight - 25 to 29 obese - 30 or more Class 1 obesity: BMI of 30.0 to 34 Class 2 obesity: BMI of 35.0 to 39 Class 3 obesity: BMI of 40.0 to 49 Super Morbid Obesity: BMI 50-59 Super-super Morbid Obesity: BMI 60+ Significantly low or high BMI is associated with higher medical risk.  Weight management advised as adjunct to other disease management and risk reduction treatments    DVT prophylaxis: lovenox  IV fluids: no continuous IV fluids  Nutrition: cardiac Central lines / other devices: none  Code Status: FULL CODE ACP documentation reviewed:  none on file in [facility]  TOC needs: placement Medical barriers to dispo: none.              Subjective / Brief ROS:  Patient reports no concerns He is not verbalizing / not answering questions Per RN, has been confised, agitated but generally responsive to redirection    Family Communication: family at bedside on rounds      Objective Findings:  Vitals:   04/24/23 1631 04/24/23 2028 04/25/23 0507 04/25/23 0825  BP: 121/74 (!) 137/93 (!) 141/69 (!) 105/55  Pulse: 83 99 (!) 57 71  Resp: 16 16 16 18   Temp: 97.6 F (36.4 C) 98.2 F (36.8 C) 97.6 F (36.4 C) 99 F (37.2 C)  TempSrc:      SpO2: 93% 100% 100% 100%  Weight:      Height:        Intake/Output Summary (Last 24 hours) at 04/25/2023 1557 Last data filed at 04/25/2023 8119 Gross per  24 hour  Intake --  Output 1200 ml  Net -1200 ml   Filed Weights   04/22/23 1603  Weight: 72.6 kg    Examination:  Physical Exam Constitutional:      General: He is not in acute distress. Cardiovascular:     Rate and Rhythm: Normal rate and regular rhythm.  Pulmonary:     Effort: Pulmonary effort is normal.     Breath sounds: Normal breath sounds.  Neurological:     Mental Status: He is alert. Mental status is at baseline. He is disoriented.  Psychiatric:        Mood and Affect: Mood normal.         Behavior: Behavior normal.          Scheduled Medications:   carbidopa-levodopa  1 tablet Oral QHS   carbidopa-levodopa  2 tablet Oral TID   docusate sodium  100 mg Oral BID   enoxaparin (LOVENOX) injection  40 mg Subcutaneous Q24H   metoprolol tartrate  25 mg Oral BID   QUEtiapine  25 mg Oral BID    Continuous Infusions:   ceFAZolin (ANCEF) IV      PRN Medications:  acetaminophen, bisacodyl, diphenhydrAMINE, HYDROcodone-acetaminophen, magnesium hydroxide, metoCLOPramide **OR** metoCLOPramide (REGLAN) injection, metoprolol tartrate, morphine injection, ondansetron **OR** ondansetron (ZOFRAN) IV, sodium phosphate  Antimicrobials from admission:  Anti-infectives (From admission, onward)    Start     Dose/Rate Route Frequency Ordered Stop   04/22/23 2200  ceFAZolin (ANCEF) IVPB 2g/100 mL premix        2 g 200 mL/hr over 30 Minutes Intravenous Every 6 hours 04/22/23 2055 04/23/23 0438   04/22/23 1530  ceFAZolin (ANCEF) IVPB 2g/100 mL premix        2 g 200 mL/hr over 30 Minutes Intravenous 30 min pre-op 04/22/23 1532             Data Reviewed:  I have personally reviewed the following...  CBC: Recent Labs  Lab 04/22/23 1240 04/23/23 0516 04/24/23 0218 04/25/23 0447  WBC 9.4 15.5* 20.6* 14.0*  NEUTROABS  --   --   --  12.2*  HGB 13.4 11.2* 10.0* 10.2*  HCT 41.6 34.0* 29.6* 30.0*  MCV 86.3 84.8 82.2 83.1  PLT 292 238 227 242   Basic Metabolic Panel: Recent Labs  Lab 04/22/23 1240 04/23/23 0516 04/24/23 0218 04/25/23 0447  NA 139 135 132* 136  K 4.1 4.6 4.4 4.5  CL 103 104 102 105  CO2 25 21* 21* 24  GLUCOSE 101* 181* 142* 127*  BUN 36* 46* 61* 53*  CREATININE 1.15 1.35* 1.20 1.07  CALCIUM 9.7 8.4* 8.4* 8.7*  MG  --   --  2.1 2.2   GFR: Estimated Creatinine Clearance: 54.7 mL/min (by C-G formula based on SCr of 1.07 mg/dL). Liver Function Tests: Recent Labs  Lab 04/23/23 0516  AST 29  ALT 19  ALKPHOS 95  BILITOT 0.9  PROT 6.4*  ALBUMIN  3.3*   No results for input(s): "LIPASE", "AMYLASE" in the last 168 hours. No results for input(s): "AMMONIA" in the last 168 hours. Coagulation Profile: No results for input(s): "INR", "PROTIME" in the last 168 hours. Cardiac Enzymes: No results for input(s): "CKTOTAL", "CKMB", "CKMBINDEX", "TROPONINI" in the last 168 hours. BNP (last 3 results) No results for input(s): "PROBNP" in the last 8760 hours. HbA1C: No results for input(s): "HGBA1C" in the last 72 hours. CBG: No results for input(s): "GLUCAP" in the last 168 hours. Lipid Profile: No results for  input(s): "CHOL", "HDL", "LDLCALC", "TRIG", "CHOLHDL", "LDLDIRECT" in the last 72 hours. Thyroid Function Tests: No results for input(s): "TSH", "T4TOTAL", "FREET4", "T3FREE", "THYROIDAB" in the last 72 hours. Anemia Panel: No results for input(s): "VITAMINB12", "FOLATE", "FERRITIN", "TIBC", "IRON", "RETICCTPCT" in the last 72 hours. Most Recent Urinalysis On File:  No results found for: "COLORURINE", "APPEARANCEUR", "LABSPEC", "PHURINE", "GLUCOSEU", "HGBUR", "BILIRUBINUR", "KETONESUR", "PROTEINUR", "UROBILINOGEN", "NITRITE", "LEUKOCYTESUR" Sepsis Labs: @LABRCNTIP (procalcitonin:4,lacticidven:4) Microbiology: No results found for this or any previous visit (from the past 240 hours).    Radiology Studies last 3 days: DG HIP UNILAT W OR W/O PELVIS 2-3 VIEWS LEFT Result Date: 04/22/2023 CLINICAL DATA:  [id number] Status post hip hemiarthroplasty [id number] EXAM: DG HIP (WITH OR WITHOUT PELVIS) 2-3V LEFT COMPARISON:  X-ray pelvis 04/22/2023. FINDINGS: Status post total left hip arthroplasty. No radiographic findings suggest surgical hardware complication. Severe degenerative changes of the right hip. There is no evidence of arthropathy or other focal bone abnormality. Overlying left hip skin staples. Pain soft tissue edema consistent with postsurgical changes. IMPRESSION: Status post total left hip arthroplasty. Electronically Signed   By:  Tish Frederickson M.D.   On: 04/22/2023 22:23   DG Hip Unilat W or Wo Pelvis 2-3 Views Left Result Date: 04/22/2023 CLINICAL DATA:  Fall and left hip pain. EXAM: DG HIP (WITH OR WITHOUT PELVIS) 2-3V LEFT COMPARISON:  None Available. FINDINGS: Mildly displaced and impacted fracture of the left femoral neck. No dislocation. The bones are osteopenic. The soft tissues are unremarkable. IMPRESSION: Mildly displaced and impacted fracture of the left femoral neck. Electronically Signed   By: Elgie Collard M.D.   On: 04/22/2023 14:55   DG Chest Portable 1 View Result Date: 04/22/2023 CLINICAL DATA:  Fall, hip fracture EXAM: PORTABLE CHEST 1 VIEW COMPARISON:  CT chest 03/16/2023 FINDINGS: Dual lead pacer noted. The patient is rotated to the left on today's radiograph, reducing diagnostic sensitivity and specificity. Low lung volumes are present, causing crowding of the pulmonary vasculature. The patient had multiple acute left lower posterior rib fractures on the CT from 03/16/2023, these are only faintly appreciable on today's chest radiograph. The lungs appear clear. Heart size within normal limits. IMPRESSION: 1. Low lung volumes, causing crowding of the pulmonary vasculature. 2. The patient had multiple acute left lower posterior rib fractures on the CT from 03/16/2023, these are only faintly appreciable on today's chest radiograph. Electronically Signed   By: Gaylyn Rong M.D.   On: 04/22/2023 14:55         Sunnie Nielsen, DO [facility] 04/25/2023, 3:57 PM    Dictation software may have been used to generate the above note. Typos may occur and escape review in typed/dictated notes. Please contact Dr Lyn Hollingshead directly for clarity if needed.  Staff may message me via secure chat in Epic  but this may not receive an immediate response,  please page me for urgent matters!  If 7PM-7AM, please contact night coverage [email or web address]

## 2023-04-25 NOTE — Progress Notes (Signed)
 Physical Therapy Treatment Patient Details Name: Daniel [name] MRN: [id number] DOB: [birth year] Today's Date: 04/25/2023   History of Present Illness Pt is an 83 y.o. male with medical history significant of atrial fibrillation, CAD, pacemaker, HTN, prostate cancer, dysphagia, Parkinson's disease, recurrent falls presenting with fall and left hip fracture. Now s/p L hip bipolar hemiarthroplasty, LLE WBAT, posterior total hip precautions.    PT Comments  Pt was long sitting in bed with RN at bedside. Pt is agreeable to OOB activity but does seems more confused/slightly agitated today. He was agreeable to OOB activity. Overall less assistance to exit bed and stand several time to RW. Pt did not have severe posterior push like previous date. He was able to take a few step with recliner follow. Overall tolerates session well. DC recs remain appropriate to maximize his independence and safety with all ADLs while decreasing caregiver burden.    If plan is discharge home, recommend the following: A lot of help with walking and/or transfers;A lot of help with bathing/dressing/bathroom;Assistance with cooking/housework;Assistance with feeding;Direct supervision/assist for medications management;Direct supervision/assist for financial management;Assist for transportation;Help with stairs or ramp for entrance     Equipment Recommendations  Other (comment) (Defer to next level of care)       Precautions / Restrictions Precautions Precautions: Posterior Hip;Fall Precaution Booklet Issued: Yes (comment) Recall of Precautions/Restrictions: Impaired Precaution/Restrictions Comments: pt somewhat aware of hip injury however no recall of precautions today Restrictions Weight Bearing Restrictions Per Provider Order: Yes LLE Weight Bearing Per Provider Order: Weight bearing as tolerated     Mobility  Bed Mobility Overal bed mobility: Needs Assistance Bed Mobility: Supine to Sit  Supine to sit: Min assist   General bed mobility comments: Much less assistance today versus previous date. Author contributes improved abilities due to pt's slight agitation.    Transfers Overall transfer level: Needs assistance Equipment used: Rolling walker (2 wheels) Transfers: Sit to/from Stand Sit to Stand: Min assist, Mod assist  General transfer comment: pt also able to demonstrate imporved abilities to STS. He stood 3 x EOB. Pt tends to have narrow BOS. much less severe posterior push versus previous date    Ambulation/Gait Ambulation/Gait assistance: Min assist, Mod assist Gait Distance (Feet): 5 Feet Assistive device: Rolling walker (2 wheels) Gait Pattern/deviations: Step-to pattern Gait velocity: decreased  General Gait Details: pt was able to take ~ 5 ft/steps with RW+ chair follow. Does endorse pain/fatigue but overall tolerated well. Chair alarm in place. RN aware of his abilities.     Balance Overall balance assessment: Needs assistance, History of Falls Sitting-balance support: Bilateral upper extremity supported, Feet supported Sitting balance-Leahy Scale: Fair     Standing balance support: Bilateral upper extremity supported, Reliant on assistive device for balance Standing balance-Leahy Scale: Poor Standing balance comment: pt remains high fall risk     Communication Communication Communication: No apparent difficulties  Cognition Arousal: Alert Behavior During Therapy: Agitated   PT - Cognitive impairments: History of cognitive impairments    PT - Cognition Comments: pt is slightly agitated this session. Per RN, pt did not sleep last night. Overall more confused versus previous date. Following commands: Intact      Cueing Cueing Techniques: Verbal cues, Tactile cues         Pertinent Vitals/Pain Pain Assessment Pain Assessment: No/denies pain Pain Score: 0-No pain Pain Descriptors / Indicators: Discomfort Pain Intervention(s): Limited activity within patient's tolerance,  Repositioned     PT Goals (current goals can now be found  in the care plan section) Acute Rehab PT Goals Patient Stated Goal: none stated Progress towards PT goals: Progressing toward goals    Frequency    7X/week       Co-evaluation     PT goals addressed during session: Mobility/safety with mobility;Balance;Proper use of DME;Strengthening/ROM        AM-PAC PT "6 Clicks" Mobility   Outcome Measure  Help needed turning from your back to your side while in a flat bed without using bedrails?: A Lot Help needed moving from lying on your back to sitting on the side of a flat bed without using bedrails?: A Lot Help needed moving to and from a bed to a chair (including a wheelchair)?: A Lot Help needed standing up from a chair using your arms (e.g., wheelchair or bedside chair)?: A Lot Help needed to walk in hospital room?: A Lot Help needed climbing 3-5 steps with a railing? : A Lot 6 Click Score: 12    End of Session   Activity Tolerance: Patient tolerated treatment well Patient left: in chair;with call bell/phone within reach;with chair alarm set Nurse Communication: Mobility status;Weight bearing status;Precautions PT Visit Diagnosis: Unsteadiness on feet (R26.81);History of falling (Z91.81);Other abnormalities of gait and mobility (R26.89);Muscle weakness (generalized) (M62.81)     Time: 1610-9604 PT Time Calculation (min) (ACUTE ONLY): 24 min  Charges:    $Gait Training: 8-22 mins $Therapeutic Activity: 8-22 mins PT General Charges $$ ACUTE PT VISIT: 1 Visit                    Jetta Lout PTA 04/25/23, 8:54 AM

## 2023-04-25 NOTE — Care Management Important Message (Signed)
 Important Message  Patient Details  Name: Daniel [name] MRN: [id number] Date of Birth: October 25, 1940   Important Message Given:  Yes - Medicare IM     Cristela Blue, CMA 04/25/2023, 11:16 AM

## 2023-04-25 NOTE — Progress Notes (Signed)
  Subjective: 3 Days Post-Op Procedure(s) (LRB): HEMIARTHROPLASTY (BIPOLAR) HIP, POSTERIOR APPROACH FOR FRACTURE (Left) Patient reports pain as mild.  Reports moderate pain when getting up with therapy. Patient is well, and has had no acute complaints or problems Current plan is for d/c to SNF. Negative for chest pain and shortness of breath Fever: no Gastrointestinal:Negative for nausea and vomiting Patient has had a BM since surgery.  Objective: Vital signs in last 24 hours: Temp:  [97.6 F (36.4 C)-98.2 F (36.8 C)] 97.6 F (36.4 C) (04/03 0507) Pulse Rate:  [57-99] 57 (04/03 0507) Resp:  [16-18] 16 (04/03 0507) BP: (92-141)/(58-93) 141/69 (04/03 0507) SpO2:  [93 %-100 %] 100 % (04/03 0507)  Intake/Output from previous day:  Intake/Output Summary (Last 24 hours) at 04/25/2023 0636 Last data filed at 04/25/2023 0605 Gross per 24 hour  Intake --  Output 1200 ml  Net -1200 ml    Intake/Output this shift: Total I/O In: -  Out: 750 [Urine:750]  Labs: Recent Labs    04/22/23 1240 04/23/23 0516 04/24/23 0218 04/25/23 0447  HGB 13.4 11.2* 10.0* 10.2*   Recent Labs    04/24/23 0218 04/25/23 0447  WBC 20.6* 14.0*  RBC 3.60* 3.61*  HCT 29.6* 30.0*  PLT 227 242   Recent Labs    04/24/23 0218 04/25/23 0447  NA 132* 136  K 4.4 4.5  CL 102 105  CO2 21* 24  BUN 61* 53*  CREATININE 1.20 1.07  GLUCOSE 142* 127*  CALCIUM 8.4* 8.7*   No results for input(s): "LABPT", "INR" in the last 72 hours.   EXAM General - Patient is Alert, Appropriate, and Oriented Extremity - ABD soft Neurovascular intact Incision: dressing C/D/I No cellulitis present Compartment soft Dressing/Incision - clean, dry, no drainage noted to the left hip honeycomb dressing. Motor Function - intact, moving foot and toes well on exam.  Abdomen soft with intact bowel sounds.  Past Medical History:  Diagnosis Date   AF (atrial fibrillation) (HCC)    B12 deficiency    Coronary artery  disease    DH (dermatitis herpetiformis)    Dysphagia    Hypertension    Presence of permanent cardiac pacemaker    2006   Primary parkinsonism (HCC)    Prostate cancer (HCC) 2014    Assessment/Plan: 3 Days Post-Op Procedure(s) (LRB): HEMIARTHROPLASTY (BIPOLAR) HIP, POSTERIOR APPROACH FOR FRACTURE (Left) Principal Problem:   Hip fracture (HCC) Active Problems:   Parkinson's disease (HCC)   Fall   Atrial fibrillation (HCC)  Estimated body mass index is 20.55 kg/m as calculated from the following:   Height as of this encounter: 6\' 2"  (1.88 m).   Weight as of this encounter: 72.6 kg. Advance diet Up with therapy D/C IV fluids when tolerating po intake.  Labs and vitals reviewed, WBC down to 14.0 this AM. Patient has had a BM. Plan for discharge to SNF.  Following discharge continue Lovenox daily for 14 days. Follow-up with [facility] in 10-14 days for staple removal.  DVT Prophylaxis - Lovenox and TED hose Weight-Bearing as tolerated to left leg  J. Horris Latino, PA-C [facility] 04/25/2023, 6:36 AM

## 2023-04-25 NOTE — TOC Progression Note (Signed)
 Transition of Care [facility]) - Progression Note    Patient Details  Name: Daniel [name] MRN: [id number] Date of Birth: August 03, 1940  [facility] CM/SW Contact  Marlowe Sax, RN Phone Number: 04/25/2023, 12:52 PM  Clinical Narrative:     Reviewed the bed offers with Spouse and Patient, they chose [facility], I notified Tam[facility] at [facility], Ins pending  Expected Discharge Plan: Skilled Nursing Facility Barriers to Discharge: Insurance Authorization, SNF Pending bed offer  Expected Discharge Plan and Services   Discharge Planning Services: CM Consult   Living arrangements for the past 2 months: Single Family Home                 DME Arranged: N/A DME Agency: NA       HH Arranged: NA           Social Determinants of Health (SDOH) Interventions SDOH Screenings   Food Insecurity: No Food Insecurity (04/22/2023)  Housing: Low Risk  (04/22/2023)  Transportation Needs: No Transportation Needs (04/22/2023)  Utilities: Not At Risk (04/22/2023)  Financial Resource Strain: Low Risk  (02/26/2023)   Received from [facility]  Tobacco Use: Low Risk  (04/22/2023)    Readmission Risk Interventions     No data to display

## 2023-04-25 NOTE — Progress Notes (Signed)
 Occupational Therapy Treatment Patient Details Name: Daniel [name] MRN: [id number] DOB: 1940-07-22 Today's Date: 04/25/2023   History of present illness Pt is an 83 y.o. male with medical history significant of atrial fibrillation, CAD, pacemaker, HTN, prostate cancer, dysphagia, Parkinson's disease, recurrent falls presenting with fall and left hip fracture. Now s/p L hip bipolar hemiarthroplasty, LLE WBAT, posterior total hip precautions.   OT comments  [name] was seen for OT treatment on this date. Upon arrival to room pt seated in chair, agreeable to tx. Pt requires MOD A for chair>BSC>bed stand pivot t/f. MAX A pericare in standing. MAX A for LB access in sitting, difficulty maintaining posterior hip pcns. MIN A sit>sup. Pt making good progress toward goals, will continue to follow POC. Discharge recommendation remains appropriate.       If plan is discharge home, recommend the following:  Two people to help with walking and/or transfers;Assistance with cooking/housework;Assist for transportation;Direct supervision/assist for medications management;A lot of help with bathing/dressing/bathroom;Help with stairs or ramp for entrance   Equipment Recommendations  Other (comment) (defer)    Recommendations for Other Services      Precautions / Restrictions Precautions Precautions: Posterior Hip;Fall Precaution Booklet Issued: Yes (comment) Recall of Precautions/Restrictions: Impaired Restrictions Weight Bearing Restrictions Per Provider Order: Yes LLE Weight Bearing Per Provider Order: Weight bearing as tolerated       Mobility Bed Mobility Overal bed mobility: Needs Assistance Bed Mobility: Sit to Supine       Sit to supine: Min assist        Transfers Overall transfer level: Needs assistance   Transfers: Sit to/from Stand, Bed to chair/wheelchair/BSC Sit to Stand: Min assist Stand pivot transfers: Mod assist               Balance Overall balance assessment:  Needs assistance, History of Falls Sitting-balance support: Bilateral upper extremity supported, Feet supported Sitting balance-Leahy Scale: Fair     Standing balance support: Bilateral upper extremity supported, Reliant on assistive device for balance Standing balance-Leahy Scale: Poor                             ADL either performed or assessed with clinical judgement   ADL Overall ADL's : Needs assistance/impaired                                       General ADL Comments: MOD A for chair>BSC/bed stand pivot t/f. MAX A pericare in standing. MAX A for LB access in sitting, difficulty maintaining posterior hip pcns     Communication Communication Communication: Impaired Factors Affecting Communication: Reduced clarity of speech   Cognition Arousal: Alert Behavior During Therapy: Restless Cognition: No apparent impairments                               Following commands: Intact        Cueing   Cueing Techniques: Verbal cues, Tactile cues             Pertinent Vitals/ Pain       Pain Assessment Pain Assessment: No/denies pain   Frequency  Min 2X/week        Progress Toward Goals  OT Goals(current goals can now be found in the care plan section)  Progress towards OT goals: Progressing toward goals  Acute Rehab OT Goals OT Goal Formulation: With patient/family Time For Goal Achievement: 05/07/23 Potential to Achieve Goals: Good ADL Goals Pt Will Transfer to Toilet: with mod assist;bedside commode;ambulating Pt Will Perform Toileting - Clothing Manipulation and hygiene: sitting/lateral leans;with min assist Additional ADL Goal #1: Pt will recall 3/3 posterior THPs and how to maintain during LB ADL with PRN VC.  Plan      Co-evaluation        PT goals addressed during session: Mobility/safety with mobility;Balance;Proper use of DME;Strengthening/ROM        AM-PAC OT "6 Clicks" Daily Activity     Outcome  Measure   Help from another person eating meals?: None Help from another person taking care of personal grooming?: A Little Help from another person toileting, which includes using toliet, bedpan, or urinal?: A Lot Help from another person bathing (including washing, rinsing, drying)?: A Lot Help from another person to put on and taking off regular upper body clothing?: A Lot Help from another person to put on and taking off regular lower body clothing?: A Lot 6 Click Score: 15    End of Session Equipment Utilized During Treatment: Gait belt  OT Visit Diagnosis: Other abnormalities of gait and mobility (R26.89);Repeated falls (R29.6);Muscle weakness (generalized) (M62.81)   Activity Tolerance Patient tolerated treatment well   Patient Left in bed;with call bell/phone within reach;with bed alarm set   Nurse Communication          Time: 1610-9604 OT Time Calculation (min): 14 min  Charges: OT General Charges $OT Visit: 1 Visit OT Treatments $Self Care/Home Management : 8-22 mins  Kathie Dike, M.S. OTR/L  04/25/23, 10:15 AM  ascom [phone]

## 2023-04-25 NOTE — Plan of Care (Signed)
  Problem: Clinical Measurements: Goal: Will remain free from infection Outcome: Progressing Goal: Diagnostic test results will improve Outcome: Progressing   Problem: Activity: Goal: Risk for activity intolerance will decrease Outcome: Progressing   Problem: Nutrition: Goal: Adequate nutrition will be maintained Outcome: Progressing

## 2023-04-25 NOTE — TOC Progression Note (Signed)
 Transition of Care [facility]) - Progression Note    Patient Details  Name: Daniel [name] MRN: [id number] Date of Birth: 1940-04-13  [facility]) CM/SW Contact  Marlowe Sax, RN Phone Number: 04/25/2023, 1:31 PM  Clinical Narrative:     Pending PlanAuthID: [id number]   Expected Discharge Plan: Skilled Nursing Facility Barriers to Discharge: Insurance Authorization, SNF Pending bed offer  Expected Discharge Plan and Services   Discharge Planning Services: CM Consult   Living arrangements for the past 2 months: Single Family Home                 DME Arranged: N/A DME Agency: NA       HH Arranged: NA           Social Determinants of Health (SDOH) Interventions SDOH Screenings   Food Insecurity: No Food Insecurity (04/22/2023)  Housing: Low Risk  (04/22/2023)  Transportation Needs: No Transportation Needs (04/22/2023)  Utilities: Not At Risk (04/22/2023)  Financial Resource Strain: Low Risk  (02/26/2023)   Received from [facility]  Tobacco Use: Low Risk  (04/22/2023)    Readmission Risk Interventions     No data to display

## 2023-04-25 NOTE — TOC Progression Note (Signed)
 Transition of Care [facility]) - Progression Note    Patient Details  Name: Daniel [name] MRN: [id number] Date of Birth: 11/02/1940  Transition of [facility]) CM/SW Contact  Marlowe Sax, RN Phone Number: 04/25/2023, 3:54 PM  Clinical Narrative:     Approved PlanAuthID:[id number] dates:4/3-04/29/23 next review date: 04/29/23   Expected Discharge Plan: Skilled Nursing Facility Barriers to Discharge: Insurance Authorization, SNF Pending bed offer  Expected Discharge Plan and Services   Discharge Planning Services: CM Consult   Living arrangements for the past 2 months: Single Family Home                 DME Arranged: N/A DME Agency: NA       HH Arranged: NA           Social Determinants of Health (SDOH) Interventions SDOH Screenings   Food Insecurity: No Food Insecurity (04/22/2023)  Housing: Low Risk  (04/22/2023)  Transportation Needs: No Transportation Needs (04/22/2023)  Utilities: Not At Risk (04/22/2023)  Financial Resource Strain: Low Risk  (02/26/2023)   Received from [facility]  Tobacco Use: Low Risk  (04/22/2023)    Readmission Risk Interventions     No data to display

## 2023-04-26 DIAGNOSIS — S72002A Fracture of unspecified part of neck of left femur, initial encounter for closed fracture: Secondary | ICD-10-CM | POA: Diagnosis not present

## 2023-04-26 DIAGNOSIS — Z743 Need for continuous supervision: Secondary | ICD-10-CM | POA: Diagnosis not present

## 2023-04-26 DIAGNOSIS — G20C Parkinsonism, unspecified: Secondary | ICD-10-CM | POA: Diagnosis not present

## 2023-04-26 DIAGNOSIS — G4700 Insomnia, unspecified: Secondary | ICD-10-CM | POA: Diagnosis not present

## 2023-04-26 DIAGNOSIS — K5909 Other constipation: Secondary | ICD-10-CM | POA: Diagnosis not present

## 2023-04-26 DIAGNOSIS — S065XAA Traumatic subdural hemorrhage with loss of consciousness status unknown, initial encounter: Secondary | ICD-10-CM | POA: Diagnosis not present

## 2023-04-26 DIAGNOSIS — G2581 Restless legs syndrome: Secondary | ICD-10-CM | POA: Diagnosis not present

## 2023-04-26 DIAGNOSIS — S72002D Fracture of unspecified part of neck of left femur, subsequent encounter for closed fracture with routine healing: Secondary | ICD-10-CM | POA: Diagnosis not present

## 2023-04-26 DIAGNOSIS — M6281 Muscle weakness (generalized): Secondary | ICD-10-CM | POA: Diagnosis not present

## 2023-04-26 DIAGNOSIS — I6200 Nontraumatic subdural hemorrhage, unspecified: Secondary | ICD-10-CM | POA: Diagnosis not present

## 2023-04-26 DIAGNOSIS — Z1331 Encounter for screening for depression: Secondary | ICD-10-CM | POA: Diagnosis not present

## 2023-04-26 DIAGNOSIS — R944 Abnormal results of kidney function studies: Secondary | ICD-10-CM | POA: Diagnosis not present

## 2023-04-26 DIAGNOSIS — R300 Dysuria: Secondary | ICD-10-CM | POA: Diagnosis not present

## 2023-04-26 DIAGNOSIS — I10 Essential (primary) hypertension: Secondary | ICD-10-CM | POA: Diagnosis not present

## 2023-04-26 DIAGNOSIS — S2242XD Multiple fractures of ribs, left side, subsequent encounter for fracture with routine healing: Secondary | ICD-10-CM | POA: Diagnosis not present

## 2023-04-26 DIAGNOSIS — E569 Vitamin deficiency, unspecified: Secondary | ICD-10-CM | POA: Diagnosis not present

## 2023-04-26 DIAGNOSIS — I959 Hypotension, unspecified: Secondary | ICD-10-CM | POA: Diagnosis not present

## 2023-04-26 DIAGNOSIS — G20A1 Parkinson's disease without dyskinesia, without mention of fluctuations: Secondary | ICD-10-CM | POA: Diagnosis not present

## 2023-04-26 DIAGNOSIS — E611 Iron deficiency: Secondary | ICD-10-CM | POA: Diagnosis not present

## 2023-04-26 LAB — BASIC METABOLIC PANEL WITH GFR
Anion gap: 6 (ref 5–15)
BUN: 48 mg/dL — ABNORMAL HIGH (ref 8–23)
CO2: 25 mmol/L (ref 22–32)
Calcium: 8.7 mg/dL — ABNORMAL LOW (ref 8.9–10.3)
Chloride: 107 mmol/L (ref 98–111)
Creatinine, Ser: 1.13 mg/dL (ref 0.61–1.24)
GFR, Estimated: >60 mL/min (ref >60)
Glucose, Bld: 101 mg/dL — ABNORMAL HIGH (ref 70–99)
Potassium: 4.3 mmol/L (ref 3.5–5.1)
Sodium: 138 mmol/L (ref 135–145)

## 2023-04-26 LAB — MAGNESIUM: Magnesium: 2.3 mg/dL (ref 1.7–2.4)

## 2023-04-26 MED ORDER — FLEET ENEMA RE ENEM: 1.0000 | ENEMA | Freq: Once | RECTAL | Status: DC | PRN

## 2023-04-26 MED ORDER — MAGNESIUM HYDROXIDE 400 MG/5ML PO SUSP: 30.0000 mL | Freq: Every day | ORAL | Status: DC | PRN

## 2023-04-26 MED ORDER — METOPROLOL SUCCINATE ER 25 MG PO TB24: 12.5000 mg | ORAL_TABLET | Freq: Every day | ORAL | Status: DC

## 2023-04-26 MED ORDER — BISACODYL 10 MG RE SUPP: 10.0000 mg | Freq: Every day | RECTAL | Status: DC | PRN

## 2023-04-26 MED ORDER — ACETAMINOPHEN 325 MG PO TABS
325.0000 mg | ORAL_TABLET | Freq: Four times a day (QID) | ORAL | Status: DC | PRN
Start: 2023-04-26 — End: 2023-12-13

## 2023-04-26 MED ORDER — METOPROLOL SUCCINATE ER 25 MG PO TB24
12.5000 mg | ORAL_TABLET | Freq: Every day | ORAL | Status: DC
  Filled 2023-04-26: qty 1

## 2023-04-26 MED ORDER — DOCUSATE SODIUM 100 MG PO CAPS: 100.0000 mg | ORAL_CAPSULE | Freq: Two times a day (BID) | ORAL | Status: DC

## 2023-04-26 MED ORDER — CARBIDOPA-LEVODOPA 25-100 MG PO TABS: 2.0000 | ORAL_TABLET | Freq: Three times a day (TID) | ORAL | Status: AC

## 2023-04-26 NOTE — Progress Notes (Signed)
  Subjective: 4 Days Post-Op Procedure(s) (LRB): HEMIARTHROPLASTY (BIPOLAR) HIP, POSTERIOR APPROACH FOR FRACTURE (Left) Patient reports pain as mild. Sleeping well when I enter the room this AM. Patient is well, and has had no acute complaints or problems Plan for discharge to [facility] today. Negative for chest pain and shortness of breath Fever: no Gastrointestinal:Negative for nausea and vomiting Patient has had a BM since surgery.  Objective: Vital signs in last 24 hours: Temp:  [98 F (36.7 C)-99 F (37.2 C)] 98.4 F (36.9 C) (04/04 0421) Pulse Rate:  [50-131] 50 (04/04 0421) Resp:  [12-18] 12 (04/04 0421) BP: (95-141)/(55-86) 95/58 (04/04 0421) SpO2:  [97 %-100 %] 98 % (04/04 0421)  Intake/Output from previous day:  Intake/Output Summary (Last 24 hours) at 04/26/2023 0650 Last data filed at 04/26/2023 0323 Gross per 24 hour  Intake --  Output 700 ml  Net -700 ml    Intake/Output this shift: Total I/O In: -  Out: 700 [Urine:700]  Labs: Recent Labs    04/24/23 0218 04/25/23 0447  HGB 10.0* 10.2*   Recent Labs    04/24/23 0218 04/25/23 0447  WBC 20.6* 14.0*  RBC 3.60* 3.61*  HCT 29.6* 30.0*  PLT 227 242   Recent Labs    04/25/23 0447 04/26/23 0543  NA 136 138  K 4.5 4.3  CL 105 107  CO2 24 25  BUN 53* 48*  CREATININE 1.07 1.13  GLUCOSE 127* 101*  CALCIUM 8.7* 8.7*   No results for input(s): "LABPT", "INR" in the last 72 hours.   EXAM General - Patient is Alert, Appropriate, and Oriented Extremity - ABD soft Neurovascular intact Incision: dressing C/D/I No cellulitis present Compartment soft Dressing/Incision - clean, dry, no drainage noted to the left hip honeycomb dressing. Motor Function - intact, moving foot and toes well on exam.  Abdomen soft with intact bowel sounds.  Past Medical History:  Diagnosis Date   AF (atrial fibrillation) (HCC)    B12 deficiency    Coronary artery disease    DH (dermatitis herpetiformis)    Dysphagia     Hypertension    Presence of permanent cardiac pacemaker    2006   Primary parkinsonism (HCC)    Prostate cancer (HCC) 2014    Assessment/Plan: 4 Days Post-Op Procedure(s) (LRB): HEMIARTHROPLASTY (BIPOLAR) HIP, POSTERIOR APPROACH FOR FRACTURE (Left) Principal Problem:   Hip fracture (HCC) Active Problems:   Parkinson's disease (HCC)   Fall   Atrial fibrillation (HCC)  Estimated body mass index is 20.55 kg/m as calculated from the following:   Height as of this encounter: 6\' 2"  (1.88 m).   Weight as of this encounter: 72.6 kg. Advance diet Up with therapy D/C IV fluids when tolerating po intake.  Vitals reviewed this AM, BP soft 95/58. Patient has had a BM. Plan for discharge to [facility].  Insurance has been approved.  Following discharge continue Lovenox daily for 14 days. Follow-up with [facility] in 10-14 days for staple removal.  DVT Prophylaxis - Lovenox and TED hose Weight-Bearing as tolerated to left leg  J. Horris Latino, PA-C [facility] 04/26/2023, 6:50 AM

## 2023-04-26 NOTE — TOC Progression Note (Signed)
 Transition of Care [facility]) - Progression Note    Patient Details  Name: Daniel [name] MRN: [id number] Date of Birth: 09/07/1940  Transition [facility] CM/SW Contact  Marlowe Sax, RN Phone Number: 04/26/2023, 12:51 PM  Clinical Narrative:    Spoke with wife [name] and notified her that the patient will DC today to [facility] room 714  Called [facility] to arrange transport Asked for transport after 2 PM so he can finish his lunch  Expected Discharge Plan: Skilled Nursing Facility Barriers to Discharge: English as a second language teacher, SNF Pending bed offer  Expected Discharge Plan and Services   Discharge Planning Services: CM Consult   Living arrangements for the past 2 months: Single Family Home Expected Discharge Date: 04/26/23               DME Arranged: N/A DME Agency: NA       HH Arranged: NA           Social Determinants of Health (SDOH) Interventions SDOH Screenings   Food Insecurity: No Food Insecurity (04/22/2023)  Housing: Low Risk  (04/22/2023)  Transportation Needs: No Transportation Needs (04/22/2023)  Utilities: Not At Risk (04/22/2023)  Financial Resource Strain: Low Risk  (02/26/2023)   Received from [facility]  Tobacco Use: Low Risk  (04/22/2023)    Readmission Risk Interventions     No data to display

## 2023-04-26 NOTE — Progress Notes (Signed)
 PT Cancellation Note  Patient Details Name: Daniel [name] MRN: [id number] DOB: Nov 21, 1940   Cancelled Treatment:     PT attempt x 2 this date. Pt sound asleep on both attempts. Unable to stay awake to safely participate. Will return this afternoon and continue to follow per current POC.    Rushie Chestnut 04/26/2023, 11:31 AM

## 2023-04-26 NOTE — Discharge Instructions (Signed)

## 2023-04-26 NOTE — Discharge Summary (Signed)
 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 in 10-14 days postop for staple removal (no later than 05/06/23)    Discharge Instructions     Call MD for:  redness, tenderness, or signs of infection (pain, swelling, redness, odor or green/yellow discharge around incision site)   Complete by: As directed    Diet general   Complete by: As directed    Increase activity slowly   Complete by: As directed          Discharge Diagnoses: Principal Problem:   Hip fracture [facility]) Active Problems:   Fall   Parkinson's disease [facility])   Atrial fibrillation [facility])       Hospital course / significant events:   HPI: Daniel [name] is a 83 y.o. male with medical history significant of atrial fibrillation, CAD, hypertension, dysphagia, Parkinson's disease, recurrent falls presenting with fall and left hip fracture.  Wife reports patient attempted to ambulate when he missed his walker subsequently falling on his left side.   03/31: admitted to hospitalist service, ortho consult, L hip repair 04/01: PT/OT, WBAT, recs for SNF  04/02-04/03: pending SNF rehab placement 04/04: remains stable, discharge to [facility]    Consultants:  Orthopedics  Procedures/Surgeries: 04/22/23: Left hip bipolar hemiarthroplasty w/ Dr  Joice Lofts      ASSESSMENT & PLAN:   Displaced femoral neck fracture, left hip  04/22/23: Left hip bipolar hemiarthroplasty w/ Dr Madelin Rear as tolerated to left leg  PT/OT Following discharge continue Lovenox daily for 14 days. Follow-up with [facility] in 10-14 days postop for staple removal (no later than 05/06/23)   Recurrent Falls Noted multiple falls at home in the setting of Parkinson's disease.  Even though pt uses walker, he can fall backwards. Noted recent admission in the [facility] system February 2024 for issues including, subacute left subdural hemorrhage with 0.3 cm rightward midline shift, small left hemopneumothorax, small left lower lobe pulmonary laceration, acute fractures of the left 5-10 ribs, some of which are comminuted and segmental-stable Fall precautions  PT/OT  Hold long term anticoagulation / antiplatelet - ok for DVT ppx as above    Permeant Atrial fibrillation  Episode here of brief RVR d/t beta blocker withdrawal / rebound tachycardia - resolved Not on anticoagulation in setting of fall risk Continue beta blocker with holding parameters for hypotension If low BP/tachycardia persists will need follow up with PCP/cardiology    Parkinson's disease [facility] - 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  Underweight - under 18  overweight - 25 to 29 obese - 30 or more Class 1 obesity: BMI of 30.0 to 34 Class 2 obesity: BMI of 35.0 to 39 Class 3 obesity: BMI of 40.0 to 49 Super Morbid Obesity: BMI 50-59 Super-super Morbid Obesity:  BMI 60+ Significantly low or high BMI is associated with higher medical risk.  Weight management advised as adjunct to other disease management and risk reduction treatments             Discharge Instructions  Allergies as of 04/26/2023       Reactions   Clindamycin/lincomycin Hives   Clindamycin Hives   Doxycycline Rash    Hydrocodone-acetaminophen Rash   Levaquin [levofloxacin] Rash        Medication List     STOP taking these medications    aspirin EC 81 MG tablet   silodosin 8 MG Caps capsule Commonly known as: RAPAFLO       TAKE these medications    acetaminophen 325 MG tablet Commonly known as: TYLENOL Take 1-2 tablets (325-650 mg total) by mouth every 6 (six) hours as needed for mild pain (pain score 1-3) (or temp > 100.5).   bisacodyl 10 MG suppository Commonly known as: DULCOLAX Place 1 suppository (10 mg total) rectally daily as needed for moderate constipation.   carbidopa-levodopa 50-200 MG tablet Commonly known as: SINEMET CR Take 1 tablet by mouth at bedtime. What changed: Another medication with the same name was changed. Make sure you understand how and when to take each.   carbidopa-levodopa 25-100 MG tablet Commonly known as: SINEMET IR Take 2 tablets by mouth with breakfast, with lunch, and with evening meal. What changed: when to take this   cholecalciferol 25 MCG (1000 UNIT) tablet Commonly known as: VITAMIN D3 Take 1,000 Units by mouth daily.   cyanocobalamin 1000 MCG tablet Take 1,000 mcg by mouth daily.   dapsone 25 MG tablet Take 25 mg by mouth 3 (three) times a week.   docusate sodium 100 MG capsule Commonly known as: COLACE Take 1 capsule (100 mg total) by mouth 2 (two) times daily.   enoxaparin 40 MG/0.4ML injection Commonly known as: LOVENOX Inject 0.4 mLs (40 mg total) into the skin daily.   HYDROcodone-acetaminophen 5-325 MG tablet Commonly known as: NORCO/VICODIN Take 1 tablet by mouth every 6 (six) hours as needed for severe pain (pain score 7-10).   latanoprost 0.005 % ophthalmic solution Commonly known as: XALATAN Place 1 drop into both eyes at bedtime.   magnesium hydroxide 400 MG/5ML suspension Commonly known as: MILK OF MAGNESIA Take 30 mLs by mouth daily as needed for mild constipation.   metoprolol succinate 25 MG 24 hr  tablet Commonly known as: TOPROL-XL Take 0.5 tablets (12.5 mg total) by mouth daily. ** DO NOT CRUSH **    (BETA BLOCKER)  - HOLD FOR SBP <110, DBP <70 Start taking on: April 27, 2023 What changed: additional instructions   sodium phosphate Enem Place 133 mLs (1 enema total) rectally once as needed for severe constipation.         Follow-up Information     Anson [place], PA-C Follow up in 14 day(s).   Specialty: Physician Assistant Why: Ladell Heads Contact information: [address] [place] [address] [phone]                 Allergies  Allergen Reactions   Clindamycin/Lincomycin Hives   Clindamycin Hives   Doxycycline Rash   Hydrocodone-Acetaminophen Rash   Levaquin [Levofloxacin] Rash     Subjective: pt reprots no concerns, speech is a bit slurred at baseline but he can follow commands, he shakes head 'no' when asked about pain, touble breathing, new weakness, abdominal pain, any other problems    Discharge Exam:  BP 103/60 (BP Location: Left Arm)   Pulse 67   Temp 97.6 F (36.4 C)   Resp 16   Ht 6\' 2"  (1.88 m)   Wt 72.6 kg   SpO2 100%   BMI 20.55 kg/m  General: Pt is alert, awake, not in acute distress Cardiovascular: RRR, S1/S2 +murmue, no rubs, no gallops Respiratory: CTA bilaterally, no wheezing, no rhonchi Abdominal: Soft, NT, ND, bowel sounds +WNL Extremities: no edema, no cyanosis     The results of significant diagnostics from this hospitalization (including imaging, microbiology, ancillary and laboratory) are listed below for reference.     Microbiology: No results found for this or any previous visit (from the past 240 hours).   Labs: BNP (last 3 results) No results for input(s): "BNP" in the last 8760 hours. Basic Metabolic Panel: Recent Labs  Lab 04/22/23 1240 04/23/23 0516 04/24/23 0218 04/25/23 0447 04/26/23 0543  NA 139 135 132* 136 138  K 4.1 4.6 4.4 4.5 4.3  CL 103 104 102 105 107  CO2 25  21* 21* 24 25  GLUCOSE 101* 181* 142* 127* 101*  BUN 36* 46* 61* 53* 48*  CREATININE 1.15 1.35* 1.20 1.07 1.13  CALCIUM 9.7 8.4* 8.4* 8.7* 8.7*  MG  --   --  2.1 2.2 2.3   Liver Function Tests: Recent Labs  Lab 04/23/23 0516  AST 29  ALT 19  ALKPHOS 95  BILITOT 0.9  PROT 6.4*  ALBUMIN 3.3*   No results for input(s): "LIPASE", "AMYLASE" in the last 168 hours. No results for input(s): "AMMONIA" in the last 168 hours. CBC: Recent Labs  Lab 04/22/23 1240 04/23/23 0516 04/24/23 0218 04/25/23 0447  WBC 9.4 15.5* 20.6* 14.0*  NEUTROABS  --   --   --  12.2*  HGB 13.4 11.2* 10.0* 10.2*  HCT 41.6 34.0* 29.6* 30.0*  MCV 86.3 84.8 82.2 83.1  PLT 292 238 227 242   Cardiac Enzymes: No results for input(s): "CKTOTAL", "CKMB", "CKMBINDEX", "TROPONINI" in the last 168 hours. BNP: Invalid input(s): "POCBNP" CBG: No results for input(s): "GLUCAP" in the last 168 hours. D-Dimer No results for input(s): "DDIMER" in the last 72 hours. Hgb A1c No results for input(s): "HGBA1C" in the last 72 hours. Lipid Profile No results for input(s): "CHOL", "HDL", "LDLCALC", "TRIG", "CHOLHDL", "LDLDIRECT" in the last 72 hours. Thyroid function studies No results for input(s): "TSH", "T4TOTAL", "T3FREE", "THYROIDAB" in the last 72 hours.  Invalid input(s): "FREET3" Anemia work up No results for input(s): "VITAMINB12", "FOLATE", "FERRITIN", "TIBC", "IRON", "RETICCTPCT" in the last 72 hours. Urinalysis No results found for: "COLORURINE", "APPEARANCEUR", "LABSPEC", "PHURINE", "GLUCOSEU", "HGBUR", "BILIRUBINUR", "KETONESUR", "PROTEINUR", "UROBILINOGEN", "NITRITE", "LEUKOCYTESUR" Sepsis Labs Recent Labs  Lab 04/22/23 1240 04/23/23 0516 04/24/23 0218 04/25/23 0447  WBC 9.4 15.5* 20.6* 14.0*   Microbiology No results found for this or any previous visit (from the past 240 hours). Imaging DG HIP UNILAT W OR W/O PELVIS 2-3 VIEWS LEFT Result Date: 04/22/2023 CLINICAL DATA:  [id number] Status post  hip hemiarthroplasty [id number] EXAM: DG HIP (WITH OR WITHOUT PELVIS) 2-3V LEFT COMPARISON:  X-ray pelvis 04/22/2023. FINDINGS: Status post total left hip arthroplasty. No radiographic findings suggest surgical hardware complication. Severe degenerative changes of the right hip. There is no evidence of arthropathy or other focal bone abnormality. Overlying left hip skin staples. Pain soft tissue edema consistent with postsurgical changes. IMPRESSION: Status post total left hip arthroplasty. Electronically Signed   By: Tish Frederickson M.D.   On: 04/22/2023 22:23  DG Hip Unilat W or Wo Pelvis 2-3 Views Left Result Date: 04/22/2023 CLINICAL DATA:  Fall and left hip pain. EXAM: DG HIP (WITH OR WITHOUT PELVIS) 2-3V LEFT COMPARISON:  None Available. FINDINGS: Mildly displaced and impacted fracture of the left femoral neck. No dislocation. The bones are osteopenic. The soft tissues are unremarkable. IMPRESSION: Mildly displaced and impacted fracture of the left femoral neck. Electronically Signed   By: Elgie Collard M.D.   On: 04/22/2023 14:55   DG Chest Portable 1 View Result Date: 04/22/2023 CLINICAL DATA:  Fall, hip fracture EXAM: PORTABLE CHEST 1 VIEW COMPARISON:  CT chest 03/16/2023 FINDINGS: Dual lead pacer noted. The patient is rotated to the left on today's radiograph, reducing diagnostic sensitivity and specificity. Low lung volumes are present, causing crowding of the pulmonary vasculature. The patient had multiple acute left lower posterior rib fractures on the CT from 03/16/2023, these are only faintly appreciable on today's chest radiograph. The lungs appear clear. Heart size within normal limits. IMPRESSION: 1. Low lung volumes, causing crowding of the pulmonary vasculature. 2. The patient had multiple acute left lower posterior rib fractures on the CT from 03/16/2023, these are only faintly appreciable on today's chest radiograph. Electronically Signed   By: Gaylyn Rong M.D.   On: 04/22/2023  14:55      Time coordinating discharge: over 30 minutes  SIGNED:  Sunnie Nielsen DO [facility]

## 2023-04-29 DIAGNOSIS — G20A1 Parkinson's disease without dyskinesia, without mention of fluctuations: Secondary | ICD-10-CM | POA: Diagnosis not present

## 2023-04-29 DIAGNOSIS — I10 Essential (primary) hypertension: Secondary | ICD-10-CM | POA: Diagnosis not present

## 2023-04-30 DIAGNOSIS — S72002D Fracture of unspecified part of neck of left femur, subsequent encounter for closed fracture with routine healing: Secondary | ICD-10-CM | POA: Diagnosis not present

## 2023-04-30 DIAGNOSIS — M6281 Muscle weakness (generalized): Secondary | ICD-10-CM | POA: Diagnosis not present

## 2023-05-02 DIAGNOSIS — R300 Dysuria: Secondary | ICD-10-CM | POA: Diagnosis not present

## 2023-05-02 DIAGNOSIS — I10 Essential (primary) hypertension: Secondary | ICD-10-CM | POA: Diagnosis not present

## 2023-05-02 DIAGNOSIS — G4700 Insomnia, unspecified: Secondary | ICD-10-CM | POA: Diagnosis not present

## 2023-05-03 DIAGNOSIS — I6200 Nontraumatic subdural hemorrhage, unspecified: Secondary | ICD-10-CM | POA: Diagnosis not present

## 2023-05-03 DIAGNOSIS — R944 Abnormal results of kidney function studies: Secondary | ICD-10-CM | POA: Diagnosis not present

## 2023-05-03 DIAGNOSIS — S2242XD Multiple fractures of ribs, left side, subsequent encounter for fracture with routine healing: Secondary | ICD-10-CM | POA: Diagnosis not present

## 2023-05-08 DIAGNOSIS — I959 Hypotension, unspecified: Secondary | ICD-10-CM | POA: Diagnosis not present

## 2023-05-09 DIAGNOSIS — I10 Essential (primary) hypertension: Secondary | ICD-10-CM | POA: Diagnosis not present

## 2023-05-09 DIAGNOSIS — G4700 Insomnia, unspecified: Secondary | ICD-10-CM | POA: Diagnosis not present

## 2023-05-14 DIAGNOSIS — R944 Abnormal results of kidney function studies: Secondary | ICD-10-CM | POA: Diagnosis not present

## 2023-05-14 DIAGNOSIS — S2242XD Multiple fractures of ribs, left side, subsequent encounter for fracture with routine healing: Secondary | ICD-10-CM | POA: Diagnosis not present

## 2023-05-16 DIAGNOSIS — G20C Parkinsonism, unspecified: Secondary | ICD-10-CM | POA: Diagnosis not present

## 2023-05-16 DIAGNOSIS — G4700 Insomnia, unspecified: Secondary | ICD-10-CM | POA: Diagnosis not present

## 2023-05-21 DIAGNOSIS — I10 Essential (primary) hypertension: Secondary | ICD-10-CM | POA: Diagnosis not present

## 2023-05-21 DIAGNOSIS — E569 Vitamin deficiency, unspecified: Secondary | ICD-10-CM | POA: Diagnosis not present

## 2023-05-30 DIAGNOSIS — I10 Essential (primary) hypertension: Secondary | ICD-10-CM | POA: Diagnosis not present

## 2023-05-30 DIAGNOSIS — S72002D Fracture of unspecified part of neck of left femur, subsequent encounter for closed fracture with routine healing: Secondary | ICD-10-CM | POA: Diagnosis not present

## 2023-05-30 DIAGNOSIS — G20A1 Parkinson's disease without dyskinesia, without mention of fluctuations: Secondary | ICD-10-CM | POA: Diagnosis not present

## 2023-06-03 DIAGNOSIS — I10 Essential (primary) hypertension: Secondary | ICD-10-CM | POA: Diagnosis not present

## 2023-06-03 DIAGNOSIS — K5909 Other constipation: Secondary | ICD-10-CM | POA: Diagnosis not present

## 2023-06-03 DIAGNOSIS — S72002D Fracture of unspecified part of neck of left femur, subsequent encounter for closed fracture with routine healing: Secondary | ICD-10-CM | POA: Diagnosis not present

## 2023-06-05 DIAGNOSIS — G2581 Restless legs syndrome: Secondary | ICD-10-CM | POA: Diagnosis not present

## 2023-06-05 DIAGNOSIS — E611 Iron deficiency: Secondary | ICD-10-CM | POA: Diagnosis not present

## 2023-06-05 DIAGNOSIS — G20C Parkinsonism, unspecified: Secondary | ICD-10-CM | POA: Diagnosis not present

## 2023-06-05 DIAGNOSIS — E569 Vitamin deficiency, unspecified: Secondary | ICD-10-CM | POA: Diagnosis not present

## 2023-06-05 DIAGNOSIS — Z1331 Encounter for screening for depression: Secondary | ICD-10-CM | POA: Diagnosis not present

## 2023-06-05 DIAGNOSIS — S065XAA Traumatic subdural hemorrhage with loss of consciousness status unknown, initial encounter: Secondary | ICD-10-CM | POA: Diagnosis not present

## 2023-06-07 DIAGNOSIS — S72002D Fracture of unspecified part of neck of left femur, subsequent encounter for closed fracture with routine healing: Secondary | ICD-10-CM | POA: Diagnosis not present

## 2023-06-07 DIAGNOSIS — I10 Essential (primary) hypertension: Secondary | ICD-10-CM | POA: Diagnosis not present

## 2023-06-07 DIAGNOSIS — G20A1 Parkinson's disease without dyskinesia, without mention of fluctuations: Secondary | ICD-10-CM | POA: Diagnosis not present

## 2023-06-10 DIAGNOSIS — S72002D Fracture of unspecified part of neck of left femur, subsequent encounter for closed fracture with routine healing: Secondary | ICD-10-CM | POA: Diagnosis not present

## 2023-06-10 DIAGNOSIS — G20A1 Parkinson's disease without dyskinesia, without mention of fluctuations: Secondary | ICD-10-CM | POA: Diagnosis not present

## 2023-06-10 DIAGNOSIS — I10 Essential (primary) hypertension: Secondary | ICD-10-CM | POA: Diagnosis not present

## 2023-06-10 DIAGNOSIS — I2510 Atherosclerotic heart disease of native coronary artery without angina pectoris: Secondary | ICD-10-CM | POA: Diagnosis not present

## 2023-06-10 DIAGNOSIS — E559 Vitamin D deficiency, unspecified: Secondary | ICD-10-CM | POA: Diagnosis not present

## 2023-06-10 DIAGNOSIS — E538 Deficiency of other specified B group vitamins: Secondary | ICD-10-CM | POA: Diagnosis not present

## 2023-06-10 DIAGNOSIS — I4891 Unspecified atrial fibrillation: Secondary | ICD-10-CM | POA: Diagnosis not present

## 2023-06-10 DIAGNOSIS — M96842 Postprocedural seroma of a musculoskeletal structure following a musculoskeletal system procedure: Secondary | ICD-10-CM | POA: Diagnosis not present

## 2023-06-10 DIAGNOSIS — R1310 Dysphagia, unspecified: Secondary | ICD-10-CM | POA: Diagnosis not present

## 2023-06-11 DIAGNOSIS — Z950 Presence of cardiac pacemaker: Secondary | ICD-10-CM | POA: Diagnosis not present

## 2023-06-11 DIAGNOSIS — G20C Parkinsonism, unspecified: Secondary | ICD-10-CM | POA: Diagnosis not present

## 2023-06-11 DIAGNOSIS — R296 Repeated falls: Secondary | ICD-10-CM | POA: Diagnosis not present

## 2023-06-13 ENCOUNTER — Inpatient Hospital Stay
Admission: EM | Admit: 2023-06-13 | Discharge: 2023-06-16 | DRG: 372 | Disposition: A | Discharge status: Home / Self Care | Attending: Internal Medicine | Admitting: Internal Medicine

## 2023-06-13 ENCOUNTER — Other Ambulatory Visit: Payer: Self-pay

## 2023-06-13 ENCOUNTER — Emergency Department

## 2023-06-13 ENCOUNTER — Encounter: Payer: Self-pay | Admitting: *Deleted

## 2023-06-13 DIAGNOSIS — Z950 Presence of cardiac pacemaker: Secondary | ICD-10-CM | POA: Diagnosis not present

## 2023-06-13 DIAGNOSIS — I10 Essential (primary) hypertension: Secondary | ICD-10-CM | POA: Diagnosis present

## 2023-06-13 DIAGNOSIS — G20A1 Parkinson's disease without dyskinesia, without mention of fluctuations: Secondary | ICD-10-CM | POA: Diagnosis present

## 2023-06-13 DIAGNOSIS — R197 Diarrhea, unspecified: Secondary | ICD-10-CM | POA: Diagnosis present

## 2023-06-13 DIAGNOSIS — I4891 Unspecified atrial fibrillation: Secondary | ICD-10-CM | POA: Diagnosis present

## 2023-06-13 DIAGNOSIS — I482 Chronic atrial fibrillation, unspecified: Secondary | ICD-10-CM | POA: Diagnosis present

## 2023-06-13 DIAGNOSIS — I2510 Atherosclerotic heart disease of native coronary artery without angina pectoris: Secondary | ICD-10-CM | POA: Diagnosis present

## 2023-06-13 DIAGNOSIS — Z8249 Family history of ischemic heart disease and other diseases of the circulatory system: Secondary | ICD-10-CM | POA: Diagnosis not present

## 2023-06-13 DIAGNOSIS — E559 Vitamin D deficiency, unspecified: Secondary | ICD-10-CM | POA: Diagnosis not present

## 2023-06-13 DIAGNOSIS — S72002D Fracture of unspecified part of neck of left femur, subsequent encounter for closed fracture with routine healing: Secondary | ICD-10-CM | POA: Diagnosis not present

## 2023-06-13 DIAGNOSIS — Z881 Allergy status to other antibiotic agents status: Secondary | ICD-10-CM | POA: Diagnosis not present

## 2023-06-13 DIAGNOSIS — L89612 Pressure ulcer of right heel, stage 2: Secondary | ICD-10-CM | POA: Diagnosis present

## 2023-06-13 DIAGNOSIS — M96842 Postprocedural seroma of a musculoskeletal structure following a musculoskeletal system procedure: Secondary | ICD-10-CM | POA: Diagnosis not present

## 2023-06-13 DIAGNOSIS — Z96642 Presence of left artificial hip joint: Secondary | ICD-10-CM | POA: Diagnosis present

## 2023-06-13 DIAGNOSIS — A045 Campylobacter enteritis: Principal | ICD-10-CM | POA: Diagnosis present

## 2023-06-13 DIAGNOSIS — Z993 Dependence on wheelchair: Secondary | ICD-10-CM | POA: Diagnosis not present

## 2023-06-13 DIAGNOSIS — Z9079 Acquired absence of other genital organ(s): Secondary | ICD-10-CM | POA: Diagnosis not present

## 2023-06-13 DIAGNOSIS — Z87828 Personal history of other (healed) physical injury and trauma: Secondary | ICD-10-CM

## 2023-06-13 DIAGNOSIS — H409 Unspecified glaucoma: Secondary | ICD-10-CM | POA: Diagnosis present

## 2023-06-13 DIAGNOSIS — Z79899 Other long term (current) drug therapy: Secondary | ICD-10-CM | POA: Diagnosis not present

## 2023-06-13 DIAGNOSIS — I700 Atherosclerosis of aorta: Secondary | ICD-10-CM | POA: Diagnosis not present

## 2023-06-13 DIAGNOSIS — K4020 Bilateral inguinal hernia, without obstruction or gangrene, not specified as recurrent: Secondary | ICD-10-CM | POA: Diagnosis not present

## 2023-06-13 DIAGNOSIS — R1310 Dysphagia, unspecified: Secondary | ICD-10-CM | POA: Diagnosis not present

## 2023-06-13 DIAGNOSIS — Z885 Allergy status to narcotic agent status: Secondary | ICD-10-CM

## 2023-06-13 DIAGNOSIS — R109 Unspecified abdominal pain: Secondary | ICD-10-CM

## 2023-06-13 DIAGNOSIS — E538 Deficiency of other specified B group vitamins: Secondary | ICD-10-CM | POA: Diagnosis not present

## 2023-06-13 DIAGNOSIS — Z8546 Personal history of malignant neoplasm of prostate: Secondary | ICD-10-CM

## 2023-06-13 DIAGNOSIS — R531 Weakness: Principal | ICD-10-CM

## 2023-06-13 DIAGNOSIS — R1084 Generalized abdominal pain: Secondary | ICD-10-CM | POA: Diagnosis not present

## 2023-06-13 LAB — CBC
HCT: 37.7 % — ABNORMAL LOW (ref 39.0–52.0)
Hemoglobin: 12.1 g/dL — ABNORMAL LOW (ref 13.0–17.0)
MCH: 27.5 pg (ref 26.0–34.0)
MCHC: 32.1 g/dL (ref 30.0–36.0)
MCV: 85.7 fL (ref 80.0–100.0)
Platelets: 382 10*3/uL (ref 150–400)
RBC: 4.4 MIL/uL (ref 4.22–5.81)
RDW: 15.2 % (ref 11.5–15.5)
WBC: 8 10*3/uL (ref 4.0–10.5)
nRBC: 0 % (ref 0.0–0.2)

## 2023-06-13 LAB — BASIC METABOLIC PANEL WITH GFR
Anion gap: 8 (ref 5–15)
BUN: 40 mg/dL — ABNORMAL HIGH (ref 8–23)
CO2: 25 mmol/L (ref 22–32)
Calcium: 9.3 mg/dL (ref 8.9–10.3)
Chloride: 103 mmol/L (ref 98–111)
Creatinine, Ser: 1.07 mg/dL (ref 0.61–1.24)
GFR, Estimated: >60 mL/min (ref >60)
Glucose, Bld: 114 mg/dL — ABNORMAL HIGH (ref 70–99)
Potassium: 4.3 mmol/L (ref 3.5–5.1)
Sodium: 136 mmol/L (ref 135–145)

## 2023-06-13 LAB — TROPONIN I (HIGH SENSITIVITY): Troponin I (High Sensitivity): 12 ng/L (ref <18)

## 2023-06-13 LAB — LIPASE, BLOOD: Lipase: 33 U/L (ref 11–51)

## 2023-06-13 MED ORDER — DILTIAZEM HCL 25 MG/5ML IV SOLN
10.0000 mg | Freq: Once | INTRAVENOUS | Status: AC
  Administered 2023-06-14: 10 mg via INTRAVENOUS
  Filled 2023-06-13: qty 5

## 2023-06-13 MED ORDER — SODIUM CHLORIDE 0.9 % IV BOLUS
500.0000 mL | Freq: Once | INTRAVENOUS | Status: AC
  Administered 2023-06-13: 500 mL via INTRAVENOUS

## 2023-06-13 NOTE — ED Triage Notes (Signed)
 Pt to triage via wheelchair.  Pt released from [facility] recently.   Family report pt is weak and is having diarrhea and abd pain.  No vomiting.  Hx parkinson's  pt alert.

## 2023-06-13 NOTE — ED Provider Notes (Signed)
 [facility] Provider Note    Event Date/Time   First MD Initiated Contact with Patient 06/13/23 2324     (approximate)   History   Weakness   HPI  [name] is a 83 y.o. male brought to the ED from home by his family with a chief complaint of generalized weakness, abdominal pain and diarrhea.  Patient with a history of Parkinson's, CAD, atrial fibrillation not on anticoagulation due to subdural hematoma, hypertension, pacemaker who was discharged Sunday from SNF following a stay status post hip fracture with surgical repair.  He is currently wheelchair bound due to postop complication.  Has been having declining strength and appetite since being home.  Unable to get around by himself.  Endorses generalized abdominal pain and diarrhea.  Denies associated fever/chills, chest pain, shortness of breath, nausea, vomiting or dysuria.     Past Medical History   Past Medical History:  Diagnosis Date   AF (atrial fibrillation) (HCC)    B12 deficiency    Coronary artery disease    DH (dermatitis herpetiformis)    Dysphagia    Hypertension    Presence of permanent cardiac pacemaker    2006   Primary parkinsonism [facility])    Prostate cancer [facility]) 2014     Active Problem List   Patient Active Problem List   Diagnosis Date Noted   Hip fracture (HCC) 04/22/2023   Parkinson's disease (HCC) 04/22/2023   Fall 04/22/2023   Atrial fibrillation (HCC) 04/22/2023     Past Surgical History   Past Surgical History:  Procedure Laterality Date   APPENDECTOMY     CARDIAC CATHETERIZATION     CATARACT EXTRACTION W/PHACO Left 05/04/2020   Procedure: CATARACT EXTRACTION PHACO AND INTRAOCULAR LENS PLACEMENT (IOC) LEFT  6.76 00:59.9 11.3%;  Surgeon: Annell Kidney, MD;  Location: [facility];  Service: Ophthalmology;  Laterality: Left;   CATARACT EXTRACTION W/PHACO Right 05/18/2020   Procedure: CATARACT EXTRACTION PHACO AND INTRAOCULAR LENS PLACEMENT (IOC)  RIGHT 15.56 01:52.6 13.8%;  Surgeon: Annell Kidney, MD;  Location: [facility];  Service: Ophthalmology;  Laterality: Right;   COLONOSCOPY  2008, 2014   CYSTOSCOPY WITH LITHOLAPAXY N/A 09/17/2019   Procedure: CYSTOSCOPY WITH LITHOLAPAXY;  Surgeon: Rea Cambridge, MD;  Location: [facility];  Service: Urology;  Laterality: N/A;   ESOPHAGOGASTRODUODENOSCOPY (EGD) WITH PROPOFOL  N/A 10/13/2015   Procedure: ESOPHAGOGASTRODUODENOSCOPY (EGD) WITH PROPOFOL ;  Surgeon: Deveron Fly, MD;  Location: [facility];  Service: Endoscopy;  Laterality: N/A;   FASCIOTOMY Left 01/30/2019   Procedure: I&D BELOW FASCIA FOOT SINGLE LEFT;  Surgeon: Anell Baptist, DPM;  Location: [facility];  Service: Podiatry;  Laterality: Left;   GREEN LIGHT LASER TURP (TRANSURETHRAL RESECTION OF PROSTATE N/A 09/17/2019   Procedure: GREEN LIGHT LASER TURP (TRANSURETHRAL RESECTION OF PROSTATE;  Surgeon: Rea Cambridge, MD;  Location: [facility];  Service: Urology;  Laterality: N/A;   HIP ARTHROPLASTY Left 04/22/2023   Procedure: HEMIARTHROPLASTY (BIPOLAR) HIP, POSTERIOR APPROACH FOR FRACTURE;  Surgeon: Elner Hahn, MD;  Location: [facility];  Service: Orthopedics;  Laterality: Left;   PACEMAKER PLACEMENT  2006     Home Medications   Prior to Admission medications   Medication Sig Start Date End Date Taking? Authorizing Provider  acetaminophen  (TYLENOL ) 325 MG tablet Take 1-2 tablets (325-650 mg total) by mouth every 6 (six) hours as needed for mild pain (pain score 1-3) (or temp > 100.5). 04/26/23   Alexander, Natalie, DO  bisacodyl  (DULCOLAX) 10 MG suppository  Place 1 suppository (10 mg total) rectally daily as needed for moderate constipation. 04/26/23   Alexander, Natalie, DO  carbidopa -levodopa  (SINEMET  CR) 50-200 MG tablet Take 1 tablet by mouth at bedtime. 08/07/19   [provider]  carbidopa -levodopa  (SINEMET  IR) 25-100 MG tablet Take 2 tablets by mouth with breakfast, with lunch, and with evening meal.  04/26/23   Melodi Sprung, DO  cholecalciferol (VITAMIN D) 25 MCG (1000 UT) tablet Take 1,000 Units by mouth daily.    [provider]  cyanocobalamin  1000 MCG tablet Take 1,000 mcg by mouth daily.    [provider]  dapsone 25 MG tablet Take 25 mg by mouth 3 (three) times a week. 12/17/22   [provider]  docusate sodium  (COLACE) 100 MG capsule Take 1 capsule (100 mg total) by mouth 2 (two) times daily. 04/26/23   Alexander, Natalie, DO  enoxaparin  (LOVENOX ) 40 MG/0.4ML injection Inject 0.4 mLs (40 mg total) into the skin daily. 04/25/23   Rojelio Clement, PA-C  HYDROcodone -acetaminophen  (NORCO/VICODIN) 5-325 MG tablet Take 1 tablet by mouth every 6 (six) hours as needed for severe pain (pain score 7-10). 04/25/23   Rojelio Clement, PA-C  latanoprost (XALATAN) 0.005 % ophthalmic solution Place 1 drop into both eyes at bedtime.     [provider]  magnesium  hydroxide (MILK OF MAGNESIA) 400 MG/5ML suspension Take 30 mLs by mouth daily as needed for mild constipation. 04/26/23   Alexander, Natalie, DO  metoprolol  succinate (TOPROL -XL) 25 MG 24 hr tablet Take 0.5 tablets (12.5 mg total) by mouth daily. ** DO NOT CRUSH **    (BETA BLOCKER)  - HOLD FOR SBP <110, DBP <70 04/27/23   Alexander, Natalie, DO  sodium phosphate  (FLEET) ENEM Place 133 mLs (1 enema total) rectally once as needed for severe constipation. 04/26/23   Alexander, Natalie, DO     Allergies  Clindamycin /lincomycin, Clindamycin , Doxycycline, Hydrocodone -acetaminophen , and Levaquin [levofloxacin]   Family History   Family History  Problem Relation Age of Onset   Heart attack Mother    Heart attack Father      Physical Exam  Triage Vital Signs: ED Triage Vitals  Encounter Vitals Group     BP 06/13/23 1847 113/78     Systolic BP Percentile --      Diastolic BP Percentile --      Pulse Rate 06/13/23 1847 90     Resp 06/13/23 1847 18     Temp 06/13/23 1847 98 F (36.7 C)     Temp  Source 06/13/23 1847 Oral     SpO2 06/13/23 1847 99 %     Weight 06/13/23 1844 147 lb (66.7 kg)     Height 06/13/23 1844 6\' 2"  (1.88 m)     Head Circumference --      Peak Flow --      Pain Score 06/13/23 1844 0     Pain Loc --      Pain Education --      Exclude from Growth Chart --     Updated Vital Signs: BP (!) 151/94   Pulse (!) 130   Temp 98 F (36.7 C) (Oral)   Resp 17   Ht 6\' 2"  (1.88 m)   Wt 66.7 kg   SpO2 100%   BMI 18.87 kg/m    General: Awake, mild distress.  Mildly dry mucous membranes. CV:  Tachycardic, irregularly irregular rhythm.  Good peripheral perfusion.  Resp:  Normal effort.  CAD. Abd:  Mild generalized tenderness  to palpation without rebound or guarding.  No distention.  Other:  Healed left hip incision.   ED Results / Procedures / Treatments  Labs (all labs ordered are listed, but only abnormal results are displayed) Labs Reviewed  BASIC METABOLIC PANEL WITH GFR - Abnormal; Notable for the following components:      Result Value   Glucose, Bld 114 (*)    BUN 40 (*)    All other components within normal limits  CBC - Abnormal; Notable for the following components:   Hemoglobin 12.1 (*)    HCT 37.7 (*)    All other components within normal limits  C DIFFICILE QUICK SCREEN W PCR REFLEX    GASTROINTESTINAL PANEL BY PCR, STOOL (REPLACES STOOL CULTURE)  LIPASE, BLOOD  HEPATIC FUNCTION PANEL  URINALYSIS, ROUTINE W REFLEX MICROSCOPIC  TROPONIN I (HIGH SENSITIVITY)  TROPONIN I (HIGH SENSITIVITY)     EKG  ED ECG REPORT I, Alcee Sipos J, the attending physician, personally viewed and interpreted this ECG.   Date: 06/14/2023  EKG Time: 1846  Rate: 112  Rhythm: atrial fibrillation, rate 112  Axis: Normal  Intervals:none  ST&T Change: Nonspecific    RADIOLOGY I have independently visualized interpreted patient's imaging studies as well as noted the radiology interpretation:  Chest x-ray: No acute cardiopulmonary process  CT  abdomen/pelvis: No acute intra-abdominal abnormality; small postoperative seroma  Official radiology report(s): CT ABDOMEN PELVIS W CONTRAST Result Date: 06/14/2023 CLINICAL DATA:  Weakness and diarrhea EXAM: CT ABDOMEN AND PELVIS WITH CONTRAST TECHNIQUE: Multidetector CT imaging of the abdomen and pelvis was performed using the standard protocol following bolus administration of intravenous contrast. RADIATION DOSE REDUCTION: This exam was performed according to the departmental dose-optimization program which includes automated exposure control, adjustment of the mA and/or kV according to patient size and/or use of iterative reconstruction technique. CONTRAST:  100mL OMNIPAQUE  IOHEXOL  300 MG/ML  SOLN COMPARISON:  None Available. FINDINGS: Lower chest: No acute abnormality. Hepatobiliary: No focal liver abnormality is seen. No gallstones, gallbladder wall thickening, or biliary dilatation. Pancreas: Unremarkable. No pancreatic ductal dilatation or surrounding inflammatory changes. Spleen: Normal in size without focal abnormality. Adrenals/Urinary Tract: Adrenal glands are within normal limits. Kidneys demonstrate normal enhancement pattern bilaterally. No renal calculi or obstructive changes are seen. The bladder is within normal limits. Stomach/Bowel: No obstructive or inflammatory changes of the colon are seen. Scattered fecal material is noted without obstructive change. Appendix has been surgically removed. Small bowel and stomach are unremarkable. Vascular/Lymphatic: Aortic atherosclerosis. No enlarged abdominal or pelvic lymph nodes. Reproductive: Prostate is unremarkable. Other: Fluid containing inguinal hernias are noted bilaterally. Musculoskeletal: Changes consistent with recent left hip replacement are noted. Small amount of fluid is noted along the posterior aspect of the joint space measuring 4.0 x 2.2 cm likely related to a postoperative seroma. No definitive joint effusion is seen. No other bony  abnormality is noted. IMPRESSION: Recent left hip replacement. Small postoperative seroma is noted in the soft tissues posteriorly. No other focal abnormality is noted. Electronically Signed   By: Violeta Grey M.D.   On: 06/14/2023 00:47   DG Chest 2 View Result Date: 06/13/2023 CLINICAL DATA:  Weakness. EXAM: CHEST - 2 VIEW COMPARISON:  06/22/2023. FINDINGS: The heart size and mediastinal contours are stable. There is atherosclerotic calcification of the aorta. No consolidation, effusion, or pneumothorax is seen. Bony structures are stable. A dual lead pacemaker is present over the left chest. IMPRESSION: No active cardiopulmonary disease. Electronically Signed   By: Wyvonnia Heimlich  M.D.   On: 06/13/2023 19:52     PROCEDURES:  Critical Care performed: Yes, see critical care procedure note(s)  CRITICAL CARE Performed by: Norlene Beavers   Total critical care time: 30 minutes  Critical care time was exclusive of separately billable procedures and treating other patients.  Critical care was necessary to treat or prevent imminent or life-threatening deterioration.  Critical care was time spent personally by me on the following activities: development of treatment plan with patient and/or surrogate as well as nursing, discussions with consultants, evaluation of patient's response to treatment, examination of patient, obtaining history from patient or surrogate, ordering and performing treatments and interventions, ordering and review of laboratory studies, ordering and review of radiographic studies, pulse oximetry and re-evaluation of patient's condition.   Aaron Aas1-3 Lead EKG Interpretation  Performed by: Norlene Beavers, MD Authorized by: Norlene Beavers, MD     Interpretation: abnormal     ECG rate:  135   ECG rate assessment: tachycardic     Rhythm: atrial fibrillation     Ectopy: none     Conduction: normal   Comments:     Patient placed on cardia monitor to evaluate for  arrhythmias    MEDICATIONS ORDERED IN ED: Medications  sodium chloride  0.9 % bolus 500 mL (500 mLs Intravenous New Bag/Given 06/13/23 2359)  diltiazem (CARDIZEM) injection 10 mg (10 mg Intravenous Given 06/14/23 0002)  iohexol  (OMNIPAQUE ) 300 MG/ML solution 100 mL (100 mLs Intravenous Contrast Given 06/14/23 0021)     IMPRESSION / MDM / ASSESSMENT AND PLAN / ED COURSE  I reviewed the triage vital signs and the nursing notes.                             83 year old male presenting to the ED for generalized weakness, abdominal pain and diarrhea. Differential diagnosis includes, but is not limited to, acute appendicitis, renal colic, testicular torsion, urinary tract infection/pyelonephritis, prostatitis,  epididymitis, diverticulitis, small bowel obstruction or ileus, colitis, abdominal aortic aneurysm, gastroenteritis, hernia, etc. I personally reviewed patient's records and note a PCP follow-up on 06/11/2023 for Parkinson's, subdural hematoma, frequent falls, hip fracture status post repair.  Patient's presentation is most consistent with acute complicated illness / injury requiring diagnostic workup.  The patient is on the cardiac monitor to evaluate for evidence of arrhythmia and/or significant heart rate changes.  Administer Cardizem for A-fib with RVR.  Laboratory results unremarkable.  Obtain CT abdomen/pelvis, administer IV fluid hydration.  Anticipate hospitalization.  Clinical Course as of 06/14/23 0126  Fri Jun 14, 2023  0125 Heart rate A-fib 90s-110s.  CT unremarkable for acute process.  Will consult hospitalist services for evaluation and admission. [JS]    Clinical Course User Index [JS] Norlene Beavers, MD    FINAL CLINICAL IMPRESSION(S) / ED DIAGNOSES   Final diagnoses:  Generalized weakness  Atrial fibrillation with rapid ventricular response (HCC)  Diarrhea, unspecified type  Abdominal pain, unspecified abdominal location     Rx / DC Orders   ED Discharge Orders      None        Note:  This document was prepared using Dragon voice recognition software and may include unintentional dictation errors.   Kaylan Friedmann J, MD 06/14/23 [phone]

## 2023-06-13 NOTE — ED Triage Notes (Signed)
 First nurse note: Pt assisted out of car by this RN. Son reports pt dc on Sunday from [facility] after left hip surgery. Pt reports increased weakness and diarrhea since.

## 2023-06-14 ENCOUNTER — Emergency Department

## 2023-06-14 DIAGNOSIS — R197 Diarrhea, unspecified: Secondary | ICD-10-CM | POA: Diagnosis present

## 2023-06-14 DIAGNOSIS — K4020 Bilateral inguinal hernia, without obstruction or gangrene, not specified as recurrent: Secondary | ICD-10-CM | POA: Diagnosis not present

## 2023-06-14 LAB — GASTROINTESTINAL PANEL BY PCR, STOOL (REPLACES STOOL CULTURE)

## 2023-06-14 LAB — BASIC METABOLIC PANEL WITH GFR
Anion gap: 9 (ref 5–15)
BUN: 39 mg/dL — ABNORMAL HIGH (ref 8–23)
CO2: 23 mmol/L (ref 22–32)
Calcium: 8.7 mg/dL — ABNORMAL LOW (ref 8.9–10.3)
Chloride: 106 mmol/L (ref 98–111)
Creatinine, Ser: 0.94 mg/dL (ref 0.61–1.24)
GFR, Estimated: >60 mL/min (ref >60)
Glucose, Bld: 99 mg/dL (ref 70–99)
Potassium: 3.7 mmol/L (ref 3.5–5.1)
Sodium: 138 mmol/L (ref 135–145)

## 2023-06-14 LAB — HEPATIC FUNCTION PANEL
ALT: 11 U/L (ref 0–44)
AST: 20 U/L (ref 15–41)
Albumin: 3.5 g/dL (ref 3.5–5.0)
Alkaline Phosphatase: 106 U/L (ref 38–126)
Bilirubin, Direct: <0.1 mg/dL (ref 0.0–0.2)
Total Bilirubin: 0.6 mg/dL (ref 0.0–1.2)
Total Protein: 7.3 g/dL (ref 6.5–8.1)

## 2023-06-14 LAB — CBC
HCT: 32.7 % — ABNORMAL LOW (ref 39.0–52.0)
Hemoglobin: 10.5 g/dL — ABNORMAL LOW (ref 13.0–17.0)
MCH: 27.3 pg (ref 26.0–34.0)
MCHC: 32.1 g/dL (ref 30.0–36.0)
MCV: 85.2 fL (ref 80.0–100.0)
Platelets: 305 10*3/uL (ref 150–400)
RBC: 3.84 MIL/uL — ABNORMAL LOW (ref 4.22–5.81)
RDW: 15.5 % (ref 11.5–15.5)
WBC: 6.4 10*3/uL (ref 4.0–10.5)
nRBC: 0 % (ref 0.0–0.2)

## 2023-06-14 LAB — C DIFFICILE QUICK SCREEN W PCR REFLEX
C Diff antigen: NEGATIVE
C Diff interpretation: NOT DETECTED
C Diff toxin: NEGATIVE

## 2023-06-14 LAB — TROPONIN I (HIGH SENSITIVITY): Troponin I (High Sensitivity): 11 ng/L (ref <18)

## 2023-06-14 MED ORDER — OXYCODONE HCL 5 MG PO TABS: 5.0000 mg | ORAL_TABLET | ORAL | Status: DC | PRN

## 2023-06-14 MED ORDER — DAPSONE 25 MG PO TABS
25.0000 mg | ORAL_TABLET | ORAL | Status: DC
  Filled 2023-06-14: qty 1

## 2023-06-14 MED ORDER — ONDANSETRON HCL 4 MG/2ML IJ SOLN: 4.0000 mg | Freq: Four times a day (QID) | INTRAMUSCULAR | Status: DC | PRN

## 2023-06-14 MED ORDER — ACETAMINOPHEN 650 MG RE SUPP: 650.0000 mg | Freq: Four times a day (QID) | RECTAL | Status: DC | PRN

## 2023-06-14 MED ORDER — SODIUM CHLORIDE 0.9% FLUSH
3.0000 mL | Freq: Two times a day (BID) | INTRAVENOUS | Status: DC
  Administered 2023-06-14 – 2023-06-15 (×3): 3 mL via INTRAVENOUS

## 2023-06-14 MED ORDER — IOHEXOL 300 MG/ML  SOLN
100.0000 mL | Freq: Once | INTRAMUSCULAR | Status: AC | PRN
  Administered 2023-06-14: 100 mL via INTRAVENOUS

## 2023-06-14 MED ORDER — ONDANSETRON HCL 4 MG PO TABS: 4.0000 mg | ORAL_TABLET | Freq: Four times a day (QID) | ORAL | Status: DC | PRN

## 2023-06-14 MED ORDER — ENOXAPARIN SODIUM 40 MG/0.4ML IJ SOSY: 40.0000 mg | PREFILLED_SYRINGE | INTRAMUSCULAR | Status: DC

## 2023-06-14 MED ORDER — CARBIDOPA-LEVODOPA ER 50-200 MG PO TBCR
1.0000 | EXTENDED_RELEASE_TABLET | Freq: Every day | ORAL | Status: DC
  Administered 2023-06-15 (×2): 1 via ORAL
  Filled 2023-06-14 (×4): qty 1

## 2023-06-14 MED ORDER — METOPROLOL SUCCINATE ER 25 MG PO TB24
12.5000 mg | ORAL_TABLET | Freq: Every day | ORAL | Status: DC
  Administered 2023-06-14 – 2023-06-16 (×3): 12.5 mg via ORAL
  Filled 2023-06-14 (×3): qty 1

## 2023-06-14 MED ORDER — LATANOPROST 0.005 % OP SOLN
1.0000 [drp] | Freq: Every day | OPHTHALMIC | Status: DC
  Administered 2023-06-14 – 2023-06-15 (×2): 1 [drp] via OPHTHALMIC
  Filled 2023-06-14: qty 2.5

## 2023-06-14 MED ORDER — ACETAMINOPHEN 325 MG PO TABS: 650.0000 mg | ORAL_TABLET | Freq: Four times a day (QID) | ORAL | Status: DC | PRN

## 2023-06-14 MED ORDER — CARBIDOPA-LEVODOPA 25-100 MG PO TABS
2.0000 | ORAL_TABLET | Freq: Three times a day (TID) | ORAL | Status: DC
  Administered 2023-06-14 – 2023-06-16 (×7): 2 via ORAL
  Filled 2023-06-14 (×8): qty 2

## 2023-06-14 MED ORDER — HYDRALAZINE HCL 20 MG/ML IJ SOLN: 5.0000 mg | INTRAMUSCULAR | Status: DC | PRN

## 2023-06-14 MED ORDER — MORPHINE SULFATE (PF) 2 MG/ML IV SOLN: 2.0000 mg | INTRAVENOUS | Status: DC | PRN

## 2023-06-14 MED ORDER — SODIUM CHLORIDE 0.9 % IV BOLUS
500.0000 mL | Freq: Once | INTRAVENOUS | Status: AC
  Administered 2023-06-14: 500 mL via INTRAVENOUS

## 2023-06-14 MED ORDER — LACTATED RINGERS IV SOLN: INTRAVENOUS | Status: DC

## 2023-06-14 NOTE — Assessment & Plan Note (Addendum)
 Received a diltiazem 10 mg IV bolus in the ED Continue home metoprolol  Not currently on anticoagulation

## 2023-06-14 NOTE — ED Notes (Signed)
 Pt was able to void and had a small amount of formed stool

## 2023-06-14 NOTE — H&P (Addendum)
 History and Physical    Patient: Daniel [name] AVW:[id number] DOB: 1940/05/05 DOA: 06/13/2023 DOS: the patient was seen and examined on 06/14/2023 PCP: Lyle San, MD  Patient coming from: Home  Chief Complaint:  Chief Complaint  Patient presents with   Weakness    HPI: Daniel [name] is a 83 y.o. male with medical history significant for  atrial fibrillation, CAD, hypertension, dysphagia, Parkinson's disease, hospitalized in April with a hip fracture and discharged from rehab 5 days ago, being admitted with diarrhea and rapid A-fib in the ED that required a diltiazem bolus.  Wife at bedside contributes to the history however patient also able to contribute.  Since his arrival home from rehab he has been having intractable diarrhea.  He states whenever he eats goes right through him and he has decreased appetite and generalized weakness.  He has no nausea or vomiting and he has had no fever or chills.  Denies cough or chest pain ED course and data review:In rapid A-fib at 130 with otherwise normal vitals  Labs unremarkable but noted BUN of 40 with creatinine 1.07 and hemoglobin 12.1.  Lipase and LFTs WNL.  WBC normal. GI panel pending  EKG showing A-fib at 112  CT abdomen and pelvis nonacute  Patient treated with an NS 500 mL bolus and diltiazem 10 mg IV bolus with improvement in rate to the low 100s  Hospitalist consulted for admission.     Review of Systems: As mentioned in the history of present illness. All other systems reviewed and are negative.  Past Medical History:  Diagnosis Date   AF (atrial fibrillation) (HCC)    B12 deficiency    Coronary artery disease    DH (dermatitis herpetiformis)    Dysphagia    Hypertension    Presence of permanent cardiac pacemaker    2006   Primary parkinsonism [facility])    Prostate cancer (HCC) 2014   Past Surgical History:  Procedure Laterality Date   APPENDECTOMY     CARDIAC CATHETERIZATION     CATARACT EXTRACTION W/PHACO Left  05/04/2020   Procedure: CATARACT EXTRACTION PHACO AND INTRAOCULAR LENS PLACEMENT (IOC) LEFT  6.76 00:59.9 11.3%;  Surgeon: Annell Kidney, MD;  Location: [facility];  Service: Ophthalmology;  Laterality: Left;   CATARACT EXTRACTION W/PHACO Right 05/18/2020   Procedure: CATARACT EXTRACTION PHACO AND INTRAOCULAR LENS PLACEMENT (IOC) RIGHT 15.56 01:52.6 13.8%;  Surgeon: Annell Kidney, MD;  Location: [facility];  Service: Ophthalmology;  Laterality: Right;   COLONOSCOPY  2008, 2014   CYSTOSCOPY WITH LITHOLAPAXY N/A 09/17/2019   Procedure: CYSTOSCOPY WITH LITHOLAPAXY;  Surgeon: Rea Cambridge, MD;  Location: [facility] ORS;  Service: Urology;  Laterality: N/A;   ESOPHAGOGASTRODUODENOSCOPY (EGD) WITH PROPOFOL  N/A 10/13/2015   Procedure: ESOPHAGOGASTRODUODENOSCOPY (EGD) WITH PROPOFOL ;  Surgeon: Deveron Fly, MD;  Location: [facility] ENDOSCOPY;  Service: Endoscopy;  Laterality: N/A;   FASCIOTOMY Left 01/30/2019   Procedure: I&D BELOW FASCIA FOOT SINGLE LEFT;  Surgeon: Anell Baptist, DPM;  Location: [facility] ORS;  Service: Podiatry;  Laterality: Left;   GREEN LIGHT LASER TURP (TRANSURETHRAL RESECTION OF PROSTATE N/A 09/17/2019   Procedure: GREEN LIGHT LASER TURP (TRANSURETHRAL RESECTION OF PROSTATE;  Surgeon: Rea Cambridge, MD;  Location: [facility] ORS;  Service: Urology;  Laterality: N/A;   HIP ARTHROPLASTY Left 04/22/2023   Procedure: HEMIARTHROPLASTY (BIPOLAR) HIP, POSTERIOR APPROACH FOR FRACTURE;  Surgeon: Elner Hahn, MD;  Location: [facility] ORS;  Service: Orthopedics;  Laterality: Left;   PACEMAKER PLACEMENT  2006  Social History:  reports that he has never smoked. He has never used smokeless tobacco. He reports that he does not currently use alcohol. He reports that he does not use drugs.  Allergies  Allergen Reactions   Clindamycin /Lincomycin Hives   Clindamycin  Hives   Doxycycline Rash   Hydrocodone -Acetaminophen  Rash   Levaquin [Levofloxacin] Rash    Family History  Problem  Relation Age of Onset   Heart attack Mother    Heart attack Father     Prior to Admission medications   Medication Sig Start Date End Date Taking? Authorizing Provider  acetaminophen  (TYLENOL ) 325 MG tablet Take 1-2 tablets (325-650 mg total) by mouth every 6 (six) hours as needed for mild pain (pain score 1-3) (or temp > 100.5). 04/26/23   Alexander, Natalie, DO  bisacodyl  (DULCOLAX) 10 MG suppository Place 1 suppository (10 mg total) rectally daily as needed for moderate constipation. 04/26/23   Alexander, Natalie, DO  carbidopa -levodopa  (SINEMET  CR) 50-200 MG tablet Take 1 tablet by mouth at bedtime. 08/07/19   [provider]  carbidopa -levodopa  (SINEMET  IR) 25-100 MG tablet Take 2 tablets by mouth with breakfast, with lunch, and with evening meal. 04/26/23   Melodi Sprung, DO  cholecalciferol (VITAMIN D) 25 MCG (1000 UT) tablet Take 1,000 Units by mouth daily.    [provider]  cyanocobalamin  1000 MCG tablet Take 1,000 mcg by mouth daily.    [provider]  dapsone 25 MG tablet Take 25 mg by mouth 3 (three) times a week. 12/17/22   [provider]  docusate sodium  (COLACE) 100 MG capsule Take 1 capsule (100 mg total) by mouth 2 (two) times daily. 04/26/23   Alexander, Natalie, DO  enoxaparin  (LOVENOX ) 40 MG/0.4ML injection Inject 0.4 mLs (40 mg total) into the skin daily. 04/25/23   Rojelio Clement, PA-C  HYDROcodone -acetaminophen  (NORCO/VICODIN) 5-325 MG tablet Take 1 tablet by mouth every 6 (six) hours as needed for severe pain (pain score 7-10). 04/25/23   Rojelio Clement, PA-C  latanoprost (XALATAN) 0.005 % ophthalmic solution Place 1 drop into both eyes at bedtime.     [provider]  magnesium  hydroxide (MILK OF MAGNESIA) 400 MG/5ML suspension Take 30 mLs by mouth daily as needed for mild constipation. 04/26/23   Alexander, Natalie, DO  metoprolol  succinate (TOPROL -XL) 25 MG 24 hr tablet Take 0.5 tablets (12.5 mg total) by mouth daily. **  DO NOT CRUSH **    (BETA BLOCKER)  - HOLD FOR SBP <110, DBP <70 04/27/23   Alexander, Natalie, DO  sodium phosphate  (FLEET) ENEM Place 133 mLs (1 enema total) rectally once as needed for severe constipation. 04/26/23   Melodi Sprung, DO    Physical Exam: Vitals:   06/13/23 1844 06/13/23 1847 06/13/23 2330  BP:  113/78 (!) 151/94  Pulse:  90 (!) 130  Resp:  18 17  Temp:  98 F (36.7 C)   TempSrc:  Oral   SpO2:  99% 100%  Weight: 66.7 kg    Height: 6\' 2"  (1.88 m)     Physical Exam Vitals and nursing note reviewed.  Constitutional:      General: He is not in acute distress.    Comments: Patient with generalized tremor, rigidity, but awake and alert  HENT:     Head: Normocephalic and atraumatic.  Cardiovascular:     Rate and Rhythm: Tachycardia present. Rhythm irregular.     Heart sounds: Normal heart sounds.  Pulmonary:     Effort: Pulmonary effort is  normal.     Breath sounds: Normal breath sounds.  Abdominal:     Palpations: Abdomen is soft.     Tenderness: There is no abdominal tenderness.  Neurological:     Mental Status: Mental status is at baseline.     Labs on Admission: I have personally reviewed following labs and imaging studies  CBC: Recent Labs  Lab 06/13/23 1848  WBC 8.0  HGB 12.1*  HCT 37.7*  MCV 85.7  PLT 382   Basic Metabolic Panel: Recent Labs  Lab 06/13/23 1848  NA 136  K 4.3  CL 103  CO2 25  GLUCOSE 114*  BUN 40*  CREATININE 1.07  CALCIUM 9.3   GFR: Estimated Creatinine Clearance: 50.2 mL/min (by C-G formula based on SCr of 1.07 mg/dL). Liver Function Tests: Recent Labs  Lab 06/13/23 1848  AST 20  ALT 11  ALKPHOS 106  BILITOT 0.6  PROT 7.3  ALBUMIN 3.5   Recent Labs  Lab 06/13/23 1848  LIPASE 33   No results for input(s): "AMMONIA" in the last 168 hours. Coagulation Profile: No results for input(s): "INR", "PROTIME" in the last 168 hours. Cardiac Enzymes: No results for input(s): "CKTOTAL", "CKMB", "CKMBINDEX",  "TROPONINI" in the last 168 hours. BNP (last 3 results) No results for input(s): "PROBNP" in the last 8760 hours. HbA1C: No results for input(s): "HGBA1C" in the last 72 hours. CBG: No results for input(s): "GLUCAP" in the last 168 hours. Lipid Profile: No results for input(s): "CHOL", "HDL", "LDLCALC", "TRIG", "CHOLHDL", "LDLDIRECT" in the last 72 hours. Thyroid  Function Tests: No results for input(s): "TSH", "T4TOTAL", "FREET4", "T3FREE", "THYROIDAB" in the last 72 hours. Anemia Panel: No results for input(s): "VITAMINB12", "FOLATE", "FERRITIN", "TIBC", "IRON", "RETICCTPCT" in the last 72 hours. Urine analysis: No results found for: "COLORURINE", "APPEARANCEUR", "LABSPEC", "PHURINE", "GLUCOSEU", "HGBUR", "BILIRUBINUR", "KETONESUR", "PROTEINUR", "UROBILINOGEN", "NITRITE", "LEUKOCYTESUR"  Radiological Exams on Admission: CT ABDOMEN PELVIS W CONTRAST Result Date: 06/14/2023 CLINICAL DATA:  Weakness and diarrhea EXAM: CT ABDOMEN AND PELVIS WITH CONTRAST TECHNIQUE: Multidetector CT imaging of the abdomen and pelvis was performed using the standard protocol following bolus administration of intravenous contrast. RADIATION DOSE REDUCTION: This exam was performed according to the departmental dose-optimization program which includes automated exposure control, adjustment of the mA and/or kV according to patient size and/or use of iterative reconstruction technique. CONTRAST:  OMNIPAQUE  IOHEXOL  300 MG/ML  SOLN COMPARISON:  None Available. FINDINGS: Lower chest: No acute abnormality. Hepatobiliary: No focal liver abnormality is seen. No gallstones, gallbladder wall thickening, or biliary dilatation. Pancreas: Unremarkable. No pancreatic ductal dilatation or surrounding inflammatory changes. Spleen: Normal in size without focal abnormality. Adrenals/Urinary Tract: Adrenal glands are within normal limits. Kidneys demonstrate normal enhancement pattern bilaterally. No renal calculi or obstructive  changes are seen. The bladder is within normal limits. Stomach/Bowel: No obstructive or inflammatory changes of the colon are seen. Scattered fecal material is noted without obstructive change. Appendix has been surgically removed. Small bowel and stomach are unremarkable. Vascular/Lymphatic: Aortic atherosclerosis. No enlarged abdominal or pelvic lymph nodes. Reproductive: Prostate is unremarkable. Other: Fluid containing inguinal hernias are noted bilaterally. Musculoskeletal: Changes consistent with recent left hip replacement are noted. Small amount of fluid is noted along the posterior aspect of the joint space measuring 4.0 x 2.2 cm likely related to a postoperative seroma. No definitive joint effusion is seen. No other bony abnormality is noted. IMPRESSION: Recent left hip replacement. Small postoperative seroma is noted in the soft tissues posteriorly. No other focal abnormality is noted. Electronically  Signed   By: Violeta Grey M.D.   On: 06/14/2023 00:47   DG Chest 2 View Result Date: 06/13/2023 CLINICAL DATA:  Weakness. EXAM: CHEST - 2 VIEW COMPARISON:  06/22/2023. FINDINGS: The heart size and mediastinal contours are stable. There is atherosclerotic calcification of the aorta. No consolidation, effusion, or pneumothorax is seen. Bony structures are stable. A dual lead pacemaker is present over the left chest. IMPRESSION: No active cardiopulmonary disease. Electronically Signed   By: Wyvonnia Heimlich M.D.   On: 06/13/2023 19:52   Data Reviewed for HPI: Relevant notes from primary care and specialist visits, past discharge summaries as available in EHR, including Care Everywhere. Prior diagnostic testing as pertinent to current admission diagnoses Updated medications and problem lists for reconciliation ED course, including vitals, labs, imaging, treatment and response to treatment Triage notes, nursing and pharmacy notes and ED provider's notes Notable results as noted above in  HPI      Assessment and Plan: * Acute diarrhea Will repeat 500 mL bolus Follow GI panel Clear liquids  Atrial fibrillation with rapid ventricular response (HCC) Received a diltiazem 10 mg IV bolus in the ED Continue home metoprolol  Not currently on anticoagulation  Parkinson's disease (HCC) Continue Sinemet     DVT prophylaxis: Lovenox   Consults: none  Advance Care Planning:   Code Status: Prior   Family Communication: Wife at bedside  Disposition Plan: Back to previous home environment  Severity of Illness: The appropriate patient status for this patient is OBSERVATION. Observation status is judged to be reasonable and necessary in order to provide the required intensity of service to ensure the patient's safety. The patient's presenting symptoms, physical exam findings, and initial radiographic and laboratory data in the context of their medical condition is felt to place them at decreased risk for further clinical deterioration. Furthermore, it is anticipated that the patient will be medically stable for discharge from the hospital within 2 midnights of admission.   Author: Lanetta Pion, MD 06/14/2023 1:51 AM  For on call review [email or web address].

## 2023-06-14 NOTE — ED Notes (Addendum)
 Pt brief changed, new bed pad placed, incontinent of urine and stool, urine malodorous, non slip socks placed on pt

## 2023-06-14 NOTE — ED Notes (Signed)
 Pt took clear liquid breakfast tray and also clear liquid lunch tray and tolerated this well, he called out that he needed to have a BM, changed him out of wet depend and chuck and placed him on bedpan.

## 2023-06-14 NOTE — Care Management Obs Status (Signed)
 MEDICARE OBSERVATION STATUS NOTIFICATION   Patient Details  Name: TRAMOND SLINKER MRN: [id number] Date of Birth: 1940/05/14   Medicare Observation Status Notification Given:  Yes    Nellene Banana Dorwin Fitzhenry, RN 06/14/2023, 10:35 AM

## 2023-06-14 NOTE — Assessment & Plan Note (Signed)
 Will repeat 500 mL bolus Follow GI panel Clear liquids

## 2023-06-14 NOTE — Progress Notes (Signed)
  Progress Note   Patient: Daniel [name] EXB:[id number] DOB: 10/27/1940 DOA: 06/13/2023     0 DOS: the patient was seen and examined on 06/14/2023   Brief hospital course: 82yo with h/o afib, CAD, HTN, Parkinson's disease, and dysphagia who presented on 5/22 with diarrhea.  He was last hospitalized last month for a hip fracture and was discharged to SNF rehab, went home 5 days PTA.  In the ER, he was noted to be in afib with RVR and given a Diltiazem  bolus with improvement.    Assessment and Plan:   Acute diarrhea Patient presented with diarrhea C diff negative GI pathogen panel is pending Clear liquids Monitor and treat symptomatically for now   Atrial fibrillation with rapid ventricular response  Received a diltiazem  10 mg IV bolus in the ED Continue home metoprolol  Not currently on anticoagulation Has pacemaker   Parkinson's disease  Continue Sinemet   Glaucoma Continue latanoprost   H/o SDH SDH in 02/2022 and hip fracture after a fall in 03/2022 Hold Lovenox  Will order SCDs    Consultants: None  Procedures: None  Antibiotics: None  30 Day Unplanned Readmission Risk Score    Flowsheet Row ED to Hosp-Admission (Discharged) from 04/22/2023 in [facility] (1A)  30 Day Unplanned Readmission Risk Score (%) 16.37 Filed at 04/26/2023 1600       This score is the patient's risk of an unplanned readmission within 30 days of being discharged (0 -100%). The score is based on dignosis, age, lab data, medications, orders, and past utilization.   Low:  0-14.9   Medium: 15-21.9   High: 22-29.9   Extreme: 30 and above           Subjective: Improved diarrhea on recheck this AM.  Physical Exam: Vitals:   06/14/23 0823 06/14/23 0930 06/14/23 1000 06/14/23 1246  BP:  114/89 112/62 (!) 112/59  Pulse:  93 79 87  Resp:  19 16 13   Temp: 97.8 F (36.6 C)   97.9 F (36.6 C)  TempSrc: Oral   Oral  SpO2:  98% 97% 97%  Weight:      Height:         No intake or output data in the 24 hours ending 06/14/23 1450 Filed Weights   06/13/23 1844  Weight: 66.7 kg    Exam:  General:  Appears calm and comfortable and is in NAD Eyes:  normal lids, iris ENT:  grossly normal hearing, lips & tongue, mmm; Neck:  no LAD, masses or thyromegaly Cardiovascular:  RRR. No LE edema.  Respiratory:   CTA bilaterally with no wheezes/rales/rhonchi.  Normal respiratory effort. Abdomen:  soft, mildly diffusely TTP, ND Skin:  no rash or induration seen on limited exam Musculoskeletal:  +muscular rigidity Psychiatric:  flat mood and affect, speech sparse but appropriate Neurologic:  unable to effectively evaluate  Data Reviewed: I have reviewed the patient's lab results since admission.  Pertinent labs for today include:   Stable BMP WBC 6.4 Hgb 10.5, stable    Family Communication: None present  Disposition: Status is: Observation The patient remains OBS appropriate and will d/c before 2 midnights.  Planned Discharge Destination: Home    Time spent: 50 minutes  Author: Lorita Rosa, MD 06/14/2023 2:50 PM  For on call review [email or web address].

## 2023-06-14 NOTE — ED Notes (Signed)
 CCMD contacted for cardiac monitoring

## 2023-06-14 NOTE — Assessment & Plan Note (Signed)
-

## 2023-06-14 NOTE — TOC Progression Note (Signed)
 Transition of Care [facility]) - Progression Note    Patient Details  Name: Daniel [name] MRN: [id number] Date of Birth: Dec 25, 1940  Transition of [facility]) CM/SW Contact  Arminda Landmark, RN Phone Number: 06/14/2023, 10:58 AM  Clinical Narrative:    Pt does not have a TOC consult but spoke to his wife when getting her to sign the [facility] letter. He's recently returned home from a rehab/SNF after breaking his hip and required surgery. He's currently receiving HHC for PT/OT through [facility]. He has a private caregiver 2-3 hours per day to help him with getting OOB, bathed, and dressed. The caregiver returns about 1930, 3 days/week, to get him back in bed. Wife states, "this is really expensive and it's difficult to afford". They can only afford the caregiver to come 3x/week in the evenings and the other 4 days his sons come help. TOC will monitor for [facility] consult and continue to follow.         Expected Discharge Plan and Services                                               Social Determinants of Health (SDOH) Interventions SDOH Screenings   Food Insecurity: No Food Insecurity (06/14/2023)  Housing: Low Risk  (06/14/2023)  Transportation Needs: No Transportation Needs (06/14/2023)  Utilities: Not At Risk (06/14/2023)  Financial Resource Strain: Low Risk  (02/26/2023)   Received from [facility]  Social Connections: Moderately Isolated (06/14/2023)  Tobacco Use: Low Risk  (06/13/2023)    Readmission Risk Interventions     No data to display

## 2023-06-15 DIAGNOSIS — G20A1 Parkinson's disease without dyskinesia, without mention of fluctuations: Secondary | ICD-10-CM | POA: Diagnosis present

## 2023-06-15 DIAGNOSIS — Z993 Dependence on wheelchair: Secondary | ICD-10-CM | POA: Diagnosis not present

## 2023-06-15 DIAGNOSIS — Z96642 Presence of left artificial hip joint: Secondary | ICD-10-CM | POA: Diagnosis present

## 2023-06-15 DIAGNOSIS — I482 Chronic atrial fibrillation, unspecified: Secondary | ICD-10-CM | POA: Diagnosis present

## 2023-06-15 DIAGNOSIS — Z950 Presence of cardiac pacemaker: Secondary | ICD-10-CM | POA: Diagnosis not present

## 2023-06-15 DIAGNOSIS — L89612 Pressure ulcer of right heel, stage 2: Secondary | ICD-10-CM | POA: Diagnosis present

## 2023-06-15 DIAGNOSIS — A045 Campylobacter enteritis: Secondary | ICD-10-CM | POA: Diagnosis present

## 2023-06-15 DIAGNOSIS — Z885 Allergy status to narcotic agent status: Secondary | ICD-10-CM | POA: Diagnosis not present

## 2023-06-15 DIAGNOSIS — I10 Essential (primary) hypertension: Secondary | ICD-10-CM | POA: Diagnosis present

## 2023-06-15 DIAGNOSIS — Z9079 Acquired absence of other genital organ(s): Secondary | ICD-10-CM | POA: Diagnosis not present

## 2023-06-15 DIAGNOSIS — Z8249 Family history of ischemic heart disease and other diseases of the circulatory system: Secondary | ICD-10-CM | POA: Diagnosis not present

## 2023-06-15 DIAGNOSIS — Z881 Allergy status to other antibiotic agents status: Secondary | ICD-10-CM | POA: Diagnosis not present

## 2023-06-15 DIAGNOSIS — Z8546 Personal history of malignant neoplasm of prostate: Secondary | ICD-10-CM | POA: Diagnosis not present

## 2023-06-15 DIAGNOSIS — Z79899 Other long term (current) drug therapy: Secondary | ICD-10-CM | POA: Diagnosis not present

## 2023-06-15 DIAGNOSIS — I2510 Atherosclerotic heart disease of native coronary artery without angina pectoris: Secondary | ICD-10-CM | POA: Diagnosis present

## 2023-06-15 DIAGNOSIS — R197 Diarrhea, unspecified: Secondary | ICD-10-CM | POA: Diagnosis present

## 2023-06-15 DIAGNOSIS — H409 Unspecified glaucoma: Secondary | ICD-10-CM | POA: Diagnosis present

## 2023-06-15 LAB — BASIC METABOLIC PANEL WITH GFR
Anion gap: 8 (ref 5–15)
BUN: 27 mg/dL — ABNORMAL HIGH (ref 8–23)
CO2: 24 mmol/L (ref 22–32)
Calcium: 8.9 mg/dL (ref 8.9–10.3)
Chloride: 107 mmol/L (ref 98–111)
Creatinine, Ser: 0.87 mg/dL (ref 0.61–1.24)
GFR, Estimated: >60 mL/min (ref >60)
Glucose, Bld: 92 mg/dL (ref 70–99)
Potassium: 3.8 mmol/L (ref 3.5–5.1)
Sodium: 139 mmol/L (ref 135–145)

## 2023-06-15 LAB — CBC
HCT: 31 % — ABNORMAL LOW (ref 39.0–52.0)
Hemoglobin: 10.1 g/dL — ABNORMAL LOW (ref 13.0–17.0)
MCH: 27.7 pg (ref 26.0–34.0)
MCHC: 32.6 g/dL (ref 30.0–36.0)
MCV: 85.2 fL (ref 80.0–100.0)
Platelets: 307 10*3/uL (ref 150–400)
RBC: 3.64 MIL/uL — ABNORMAL LOW (ref 4.22–5.81)
RDW: 15 % (ref 11.5–15.5)
WBC: 6.2 10*3/uL (ref 4.0–10.5)
nRBC: 0 % (ref 0.0–0.2)

## 2023-06-15 MED ORDER — LACTATED RINGERS IV BOLUS
500.0000 mL | Freq: Once | INTRAVENOUS | Status: AC
  Administered 2023-06-15: 500 mL via INTRAVENOUS

## 2023-06-15 NOTE — Hospital Course (Signed)
 82yo with h/o afib, CAD, HTN, Parkinson's disease, and dysphagia who presented on 5/22 with diarrhea.  He was last hospitalized last month for a hip fracture and was discharged to SNF rehab, went home 5 days PTA.  In the ER, he was noted to be in afib with RVR and given a Diltiazem bolus with improvement.  Diarrhea is improving.

## 2023-06-15 NOTE — Plan of Care (Signed)

## 2023-06-15 NOTE — Plan of Care (Signed)

## 2023-06-15 NOTE — Evaluation (Signed)
 Physical Therapy Evaluation Patient Details Name: Daniel [name] MRN: [id number] DOB: Jun 13, 1940 Today's Date: 06/15/2023  History of Present Illness  Pt is an 83 y.o. with h/o afib, CAD, HTN, Parkinson's disease, and dysphagia who presented on 5/22 with diarrhea.  He was last hospitalized last month for a L hip fracture (s/p hemiarthroplasty, WBAT, post THPs) and was discharged to SNF rehab, went home 5 days PTA (active with HHPT).  In the ER, he was noted to be in afib with RVR and given a Diltiazem bolus with improvement.  Clinical Impression  Patient resting in bed upon arrival to room; alert and oriented, follows commands and agreeable to participation with treatment session.  Does endorse mild/mod pain to R heel (noted with open area; RN informed/aware); improved with dressing applied (per RN) and repositioning/pressure relief.  Patient generally weak and deconditioned throughout all extremities; mild increase in weakness to L LE in closed-chain position (due to recent hip fracture/repair).  Does verbally recall 2/3 THPs; frequent cuing for adherence with mobility efforts. Currently requiring mod assist for bed mobility; min/mod assist +1-2 for sit/stand, standing balance and basic transfers with RW.  Requires assist for anterior weight translation and lift off from all surfaces; very narrowed BOS with stand pivot transfers, consistent manual advancement of RW to initiate/guide movement.  Poor dynamic balance, constant hands-on assist to prevent posterior LOB.  Additional gait efforts deferred due to toileting needs this session. Would benefit from skilled PT to address above deficits and promote optimal return to PLOF.; recommend post-acute PT follow up as indicated by interdisciplinary care team.          If plan is discharge home, recommend the following: A lot of help with walking and/or transfers;A lot of help with bathing/dressing/bathroom   Can travel by private vehicle         Equipment Recommendations    Recommendations for Other Services       Functional Status Assessment Patient has had a recent decline in their functional status and demonstrates the ability to make significant improvements in function in a reasonable and predictable amount of time.     Precautions / Restrictions Precautions Precautions: Posterior Hip;Fall;ICD/Pacemaker Restrictions Weight Bearing Restrictions Per Provider Order: Yes Other Position/Activity Restrictions: WBAT LLE      Mobility  Bed Mobility Overal bed mobility: Needs Assistance Bed Mobility: Supine to Sit     Supine to sit: Mod assist          Transfers Overall transfer level: Needs assistance Equipment used: Rolling walker (2 wheels) Transfers: Sit to/from Stand, Bed to chair/wheelchair/BSC Sit to Stand: Min assist, Mod assist, +2 physical assistance   Step pivot transfers: Min assist, +2 physical assistance       General transfer comment: assist for anterior weight translation and lift off from all surfaces; very narrowed BOS with stand pivot transfers, consistent manual advancement of RW to initiate/guide movement.  Poor dynamic balance, constant hands-on assist to prevent posterior LOB    Ambulation/Gait               General Gait Details: deferred due to toileting needs  Stairs            Wheelchair Mobility     Tilt Bed    Modified Rankin (Stroke Patients Only)       Balance Overall balance assessment: Needs assistance Sitting-balance support: No upper extremity supported, Feet supported Sitting balance-Leahy Scale: Fair     Standing balance support: Bilateral upper extremity supported Standing  balance-Leahy Scale: Poor                               Pertinent Vitals/Pain Pain Assessment Pain Assessment: Faces Faces Pain Scale: Hurts a little bit Pain Location: RLE heel Pain Descriptors / Indicators: Tender Pain Intervention(s): Limited activity  within patient's tolerance, Monitored during session, Repositioned    Home Living Family/patient expects to be discharged to:: Private residence Living Arrangements: Spouse/significant other Available Help at Discharge: Family;Available 24 hours/day Type of Home: House Home Access: Stairs to enter Entrance Stairs-Rails: Doctor, general practice of Steps: 4 (has stair lift to access stairs)   Home Layout: One level Home Equipment: Shower seat - built Charity fundraiser (2 wheels);Rollator (4 wheels);BSC/3in1;Wheelchair - manual;Grab bars - tub/shower      Prior Function Prior Level of Function : Needs assist;History of Falls (last six months)             Mobility Comments: Prior to hip fx (before rehab stay) pt was amb with rollator at baseline for household distances, WC use for community mobility. Since return from rehab, has required +1-2 for all transfers.  Has hired caregiver 2-3 hours/day, 3days/week; sons assist otherwise ADLs Comments: Pt reports he is fairly indep, with his family/caregivers with him at 24/7 to assist him as needed. Pt states since home from rehab stay he has been needing physical assistance to get to the [facility] from his lift chair.     Extremity/Trunk Assessment   Upper Extremity Assessment Upper Extremity Assessment: Overall WFL for tasks assessed    Lower Extremity Assessment Lower Extremity Assessment: Generalized weakness (grossly 3+ to 4-/5 throughout) RLE Deficits / Details: Recent hip fx repair    Cervical / Trunk Assessment Cervical / Trunk Assessment: Normal  Communication   Communication Communication: Impaired Factors Affecting Communication: Reduced clarity of speech    Cognition Arousal: Alert Behavior During Therapy: WFL for tasks assessed/performed   PT - Cognitive impairments: No apparent impairments                         Following commands: Intact       Cueing Cueing Techniques: Verbal cues, Tactile  cues     General Comments General comments (skin integrity, edema, etc.): Noted open heel wound on RLE, and significant redness on bilateral LE toes- RN notified in room to assess    Exercises Other Exercises Other Exercises: Toilet transfer, SPT with RW, min/mod assist +2; min/mod assist +1 for standing balance, dep of second person for hygiene.   Assessment/Plan    PT Assessment Patient needs continued PT services  PT Problem List Decreased strength;Decreased activity tolerance;Decreased balance;Decreased mobility;Decreased knowledge of use of DME;Decreased safety awareness;Decreased knowledge of precautions       PT Treatment Interventions DME instruction;Gait training;Functional mobility training;Therapeutic activities;Therapeutic exercise;Balance training;Patient/family education    PT Goals (Current goals can be found in the Care Plan section)  Acute Rehab PT Goals Patient Stated Goal: to be able to walk again PT Goal Formulation: With patient Time For Goal Achievement: 06/29/23 Potential to Achieve Goals: Good    Frequency Min 3X/week     Co-evaluation   Reason for Co-Treatment: Complexity of the patient's impairments (multi-system involvement);For patient/therapist safety;To address functional/ADL transfers PT goals addressed during session: Mobility/safety with mobility;Balance;Proper use of DME OT goals addressed during session: ADL's and self-care;Strengthening/ROM       AM-PAC PT "6 Clicks" Mobility  Outcome Measure Help needed turning from your back to your side while in a flat bed without using bedrails?: A Little Help needed moving from lying on your back to sitting on the side of a flat bed without using bedrails?: A Lot Help needed moving to and from a bed to a chair (including a wheelchair)?: A Little Help needed standing up from a chair using your arms (e.g., wheelchair or bedside chair)?: A Little Help needed to walk in hospital room?: A Lot Help needed  climbing 3-5 steps with a railing? : A Lot 6 Click Score: 15    End of Session Equipment Utilized During Treatment: Gait belt Activity Tolerance: Patient tolerated treatment well Patient left: in chair;with call bell/phone within reach;with chair alarm set Nurse Communication: Mobility status PT Visit Diagnosis: Muscle weakness (generalized) (M62.81);Difficulty in walking, not elsewhere classified (R26.2)    Time: 1610-9604 PT Time Calculation (min) (ACUTE ONLY): 51 min   Charges:   PT Evaluation $PT Eval Moderate Complexity: 1 Mod PT Treatments $Therapeutic Activity: 8-22 mins PT General Charges $$ ACUTE PT VISIT: 1 Visit         Zaila Crew H. Bevin Bucks, PT, DPT, NCS 06/15/23, 12:53 PM [phone]

## 2023-06-15 NOTE — Progress Notes (Signed)
 Progress Note   Patient: Daniel [name] GNF:[id number] DOB: [birth year] DOA: 06/13/2023     0 DOS: the patient was seen and examined on 06/15/2023   Brief hospital course: 82yo with h/o afib, CAD, HTN, Parkinson's disease, and dysphagia who presented on 5/22 with diarrhea.  He was last hospitalized last month for a hip fracture and was discharged to SNF rehab, went home 5 days PTA.  In the ER, he was noted to be in afib with RVR and given a Diltiazem bolus with improvement.  Diarrhea is improving.    Assessment and Plan:  Acute diarrhea due to Campylobacter Patient presented with diarrhea C diff negative GI pathogen panel + Campylobacter Clear liquids -> full liquids -> soft diet Treatment is not required for uncomplicated cases unless symptoms persist for >1 week Will give an additional 500 cc IVF over 2 hours today for marginal BP   Atrial fibrillation with rapid ventricular response  Received a diltiazem 10 mg IV bolus in the ED Continue home metoprolol  Not currently on anticoagulation Has pacemaker   Parkinson's disease  Continue Sinemet    Glaucoma Continue latanoprost   H/o SDH SDH in 02/2022 and hip fracture after a fall in 03/2022 Hold Lovenox  Will order SCDs  Pressure ulcer, POA Pressure Injury 06/15/23 Heel Right Stage 2 (Active)  06/15/23 1041  Location: Heel  Location Orientation: Right  Staging:   Wound Description (Comments): Stage 2  Present on Admission:    Generalized weakness Patient only released from SNF rehab 5 days PTA and then developed fulminant diarrhea He is better now from a diarrhea standpoint but still quite weak PT/OT recommending SNF again but he is in co-pay days Will keep for an additional night and encourage OOB to chair and mobility when possible Anticipate dc to home on 5/25          Consultants: None   Procedures: None   Antibiotics: None     30 Day Unplanned Readmission Risk Score    Flowsheet Row ED to  Hosp-Admission (Discharged) from 04/22/2023 in [facility] (1A)  30 Day Unplanned Readmission Risk Score (%) 16.37 Filed at 04/26/2023 1600       This score is the patient's risk of an unplanned readmission within 30 days of being discharged (0 -100%). The score is based on dignosis, age, lab data, medications, orders, and past utilization.   Low:  0-14.9   Medium: 15-21.9   High: 22-29.9   Extreme: 30 and above           Subjective: Still feels very weak but his diarrhea is much improved.   Objective: Vitals:   06/15/23 0759 06/15/23 1150  BP: 116/69 (!) 85/60  Pulse: 68 66  Resp:    Temp: (!) 97.3 F (36.3 C) 98.7 F (37.1 C)  SpO2: 100% 100%    Intake/Output Summary (Last 24 hours) at 06/15/2023 1402 Last data filed at 06/15/2023 1101 Gross per 24 hour  Intake 914.51 ml  Output 700 ml  Net 214.51 ml   Filed Weights   06/13/23 1844  Weight: 66.7 kg    Exam:  General:  Appears calm and comfortable and is in NAD Eyes:  normal lids, iris ENT:  grossly normal hearing, lips & tongue, mmm Cardiovascular:  RRR. No LE edema.  Respiratory:   CTA bilaterally with no wheezes/rales/rhonchi.  Normal respiratory effort. Abdomen:  soft, generally NT, ND Skin:  no rash or induration seen on limited exam Musculoskeletal:  +muscular rigidity  Psychiatric:  flat mood and affect, speech sparse but appropriate Neurologic:  no gross abnormalities noted  Data Reviewed: I have reviewed the patient's lab results since admission.  Pertinent labs for today include:   Stable BMP Stable CBC GI pathogen panel positive for Campylobacter    Family Communication: None present; I spoke with his wife by telephone  Disposition: Status is: Observation The patient remains OBS appropriate and will d/c before 2 midnights.     Time spent: 50 minutes  Unresulted Labs (From admission, onward)     Start     Ordered   06/16/23 0500  CBC with  Differential/Platelet  Tomorrow morning,   R        06/15/23 1402   06/16/23 0500  Basic metabolic panel with GFR  Tomorrow morning,   R        06/15/23 1402   06/14/23 1303  Miscellaneous test (send-out)  Once,   R        06/14/23 1303             Author: Lorita Rosa, MD 06/15/2023 2:02 PM  For on call review [email or web address].

## 2023-06-15 NOTE — Progress Notes (Signed)
 Occupational Therapy Evaluation Patient Details Name: Daniel [name] MRN: [id number] DOB: Oct 08, 1940 Today's Date: 06/15/2023   History of Present Illness   Pt is an 83 y.o. with h/o afib, CAD, HTN, Parkinson's disease, and dysphagia who presented on 5/22 with diarrhea.  He was last hospitalized last month for a hip fracture and was discharged to SNF rehab, went home 5 days PTA.  In the ER, he was noted to be in afib with RVR and given a Diltiazem bolus with improvement.     Clinical Impressions Pt was seen for OT/PT evaluation this date. Prior to hospital admission, pt was recently at [facility] for rehab services, returned home for few days receiving HHOT/PT before returning to hospital. Pt lives with his wife, receiving assistance from caregiver 3x weekly and from his two sons who live close by. Pt endorses he hasn't been "getting around very well post rehab stay." Pt presents to acute OT demonstrating impaired ADL performance and functional mobility 2/2 (See OT problem list for additional functional deficits). Pt currently requires MODA to come to EOB from supine position. Pt STS from EOB with use of RW and MINA +2 for safety. Pt transferred into recliner and then step pivoted with RW to [facility] for BM (Pt void in [facility]) with MINA +2. Pt unaware of soiled chuck sheet. Pt required MAXA for pericare, completed in standing. Pt returned to recliner with all needs in reach while reviewing hip precautions with PT. Pt would benefit from skilled OT services to address noted impairments and functional limitations (see below for any additional details) in order to maximize safety and independence while minimizing falls risk and caregiver burden. OT will follow acutely.     If plan is discharge home, recommend the following:   A lot of help with walking and/or transfers;A lot of help with bathing/dressing/bathroom;Assistance with cooking/housework;Assist for transportation;Help with stairs or ramp for entrance      Functional Status Assessment   Patient has had a recent decline in their functional status and demonstrates the ability to make significant improvements in function in a reasonable and predictable amount of time.     Equipment Recommendations   Other (comment) (Defer to next venue of care)     Recommendations for Other Services         Precautions/Restrictions   Precautions Precautions: Posterior Hip;Fall;ICD/Pacemaker Restrictions Weight Bearing Restrictions Per Provider Order: Yes Other Position/Activity Restrictions: WBAT LLE     Mobility Bed Mobility Overal bed mobility: Needs Assistance Bed Mobility: Supine to Sit     Supine to sit: Mod assist          Transfers Overall transfer level: Needs assistance Equipment used: Rolling walker (2 wheels) Transfers: Sit to/from Stand, Bed to chair/wheelchair/BSC Sit to Stand: Min assist, +2 safety/equipment     Step pivot transfers: Min assist, +2 safety/equipment            Balance Overall balance assessment: Needs assistance Sitting-balance support: Feet supported, Single extremity supported Sitting balance-Leahy Scale: Good     Standing balance support: Bilateral upper extremity supported, During functional activity, Reliant on assistive device for balance Standing balance-Leahy Scale: Poor                             ADL either performed or assessed with clinical judgement   ADL Overall ADL's : Needs assistance/impaired     Grooming: Wash/dry face;Wash/dry hands;Set up;Bed level  Upper Body Dressing : Minimal assistance;Sitting Upper Body Dressing Details (indicate cue type and reason): Donning/doffing gown in sitting Lower Body Dressing: Maximal assistance;Bed level Lower Body Dressing Details (indicate cue type and reason): Donning bilateral socks Toilet Transfer: +2 for safety/equipment;Moderate assistance;Rolling walker (2 wheels);BSC/3in1 Toilet Transfer Details  (indicate cue type and reason): Step pivot from recliner Toileting- Clothing Manipulation and Hygiene: Maximal assistance;Sit to/from stand;Cueing for safety       Functional mobility during ADLs: Moderate assistance;+2 for safety/equipment;Rolling walker (2 wheels);Cueing for sequencing General ADL Comments: Set up - MINA for seated grooming tasks, requires external support for standing peri care      Pertinent Vitals/Pain Pain Assessment Pain Assessment: Faces Faces Pain Scale: Hurts a little bit Pain Location: RLE heel Pain Descriptors / Indicators: Tender Pain Intervention(s): Repositioned, Monitored during session, Other (comment) (RN in room to assess)     Extremity/Trunk Assessment Upper Extremity Assessment Upper Extremity Assessment: Generalized weakness;Overall [facility] for tasks assessed   Lower Extremity Assessment Lower Extremity Assessment: Defer to PT evaluation;RLE deficits/detail RLE Deficits / Details: Recent hip fx repair   Cervical / Trunk Assessment Cervical / Trunk Assessment: Normal   Communication Communication Communication: Impaired Factors Affecting Communication: Reduced clarity of speech   Cognition Arousal: Alert Behavior During Therapy: WFL for tasks assessed/performed Cognition: No apparent impairments             OT - Cognition Comments: A/Ox4                 Following commands: Intact       Cueing  General Comments   Cueing Techniques: Verbal cues;Tactile cues  Noted open heel wound on RLE, and significant redness on bilateral LE toes- RN notified in room to assess   Exercises Exercises: Other exercises Other Exercises Other Exercises: Edu: Role of OT, fall prevention techniques, safe ADL completion   Shoulder Instructions      Home Living Family/patient expects to be discharged to:: Private residence Living Arrangements: Spouse/significant other Available Help at Discharge: Family;Available 24 hours/day Type of  Home: House Home Access: Stairs to enter [employer] of Steps: 4 Entrance Stairs-Rails: Right;Left Home Layout: One level     Bathroom Shower/Tub: Walk-in shower         Home Equipment: Shower seat - built Charity fundraiser (2 wheels);Rollator (4 wheels);BSC/3in1;Wheelchair - manual;Grab bars - tub/shower          Prior Functioning/Environment Prior Level of Function : Needs assist;History of Falls (last six months)             Mobility Comments: Prior to hip fx (before rehab stay) pt was amb with rollator at baseline for household distances, WC use for community mobility. ADLs Comments: Pt reports he is fairly indep, with his family/caregivers with him at 24/7 to assist him as needed. Pt states since home from rehab stay he has been needing physical assistance to get to the [facility] from his lift chair.    OT Problem List: Decreased strength;Decreased range of motion;Decreased activity tolerance;Impaired balance (sitting and/or standing);Decreased coordination;Decreased safety awareness;Decreased knowledge of use of DME or AE   OT Treatment/Interventions: Self-care/ADL training;Therapeutic exercise;Energy conservation;DME and/or AE instruction;Therapeutic activities;Cognitive remediation/compensation;Visual/perceptual remediation/compensation;Patient/family education;Balance training      OT Goals(Current goals can be found in the care plan section)   Acute Rehab OT Goals Patient Stated Goal: Feel better OT Goal Formulation: With patient Time For Goal Achievement: 06/29/23 Potential to Achieve Goals: Good ADL Goals Pt Will Perform Grooming: standing;with contact guard  assist Pt Will Perform Lower Body Dressing: with set-up;sitting/lateral leans Pt Will Transfer to Toilet: ambulating;with contact guard assist Pt Will Perform Toileting - Clothing Manipulation and hygiene: with max assist;sit to/from stand   OT Frequency:  Min 3X/week    Co-evaluation PT/OT/SLP  Co-Evaluation/Treatment: Yes Reason for Co-Treatment: Complexity of the patient's impairments (multi-system involvement);For patient/therapist safety;To address functional/ADL transfers PT goals addressed during session: Mobility/safety with mobility;Balance;Proper use of DME OT goals addressed during session: ADL's and self-care;Strengthening/ROM      AM-PAC OT "6 Clicks" Daily Activity     Outcome Measure Help from another person eating meals?: A Little Help from another person taking care of personal grooming?: A Little Help from another person toileting, which includes using toliet, bedpan, or urinal?: A Lot Help from another person bathing (including washing, rinsing, drying)?: A Lot Help from another person to put on and taking off regular upper body clothing?: A Little Help from another person to put on and taking off regular lower body clothing?: A Lot 6 Click Score: 15   End of Session Equipment Utilized During Treatment: Gait belt;Rolling walker (2 wheels) Nurse Communication: Mobility status  Activity Tolerance: Patient tolerated treatment well Patient left: in chair;with call bell/phone within reach;with chair alarm set  OT Visit Diagnosis: Unsteadiness on feet (R26.81);Other abnormalities of gait and mobility (R26.89);Repeated falls (R29.6);Muscle weakness (generalized) (M62.81)                Time: 0865-7846 OT Time Calculation (min): 42 min Charges:  OT General Charges $OT Visit: 1 Visit OT Evaluation $OT Eval Moderate Complexity: 1 Mod OT Treatments $Self Care/Home Management : 8-22 mins  Rosaria Common M.S. OTR/L  06/15/23, 12:08 PM

## 2023-06-16 DIAGNOSIS — L89612 Pressure ulcer of right heel, stage 2: Secondary | ICD-10-CM | POA: Diagnosis present

## 2023-06-16 DIAGNOSIS — Z87828 Personal history of other (healed) physical injury and trauma: Secondary | ICD-10-CM

## 2023-06-16 DIAGNOSIS — A045 Campylobacter enteritis: Principal | ICD-10-CM

## 2023-06-16 DIAGNOSIS — H409 Unspecified glaucoma: Secondary | ICD-10-CM | POA: Diagnosis present

## 2023-06-16 DIAGNOSIS — R531 Weakness: Principal | ICD-10-CM

## 2023-06-16 LAB — CBC WITH DIFFERENTIAL/PLATELET
Abs Immature Granulocytes: 0.03 10*3/uL (ref 0.00–0.07)
Basophils Absolute: 0.1 10*3/uL (ref 0.0–0.1)
Basophils Relative: 1 %
Eosinophils Absolute: 0.3 10*3/uL (ref 0.0–0.5)
Eosinophils Relative: 5 %
HCT: 33.4 % — ABNORMAL LOW (ref 39.0–52.0)
Hemoglobin: 10.7 g/dL — ABNORMAL LOW (ref 13.0–17.0)
Immature Granulocytes: 1 %
Lymphocytes Relative: 17 %
Lymphs Abs: 1.1 10*3/uL (ref 0.7–4.0)
MCH: 26.8 pg (ref 26.0–34.0)
MCHC: 32 g/dL (ref 30.0–36.0)
MCV: 83.5 fL (ref 80.0–100.0)
Monocytes Absolute: 0.7 10*3/uL (ref 0.1–1.0)
Monocytes Relative: 11 %
Neutro Abs: 4.2 10*3/uL (ref 1.7–7.7)
Neutrophils Relative %: 65 %
Platelets: 309 10*3/uL (ref 150–400)
RBC: 4 MIL/uL — ABNORMAL LOW (ref 4.22–5.81)
RDW: 15 % (ref 11.5–15.5)
WBC: 6.4 10*3/uL (ref 4.0–10.5)
nRBC: 0 % (ref 0.0–0.2)

## 2023-06-16 LAB — BASIC METABOLIC PANEL WITH GFR
Anion gap: 7 (ref 5–15)
BUN: 24 mg/dL — ABNORMAL HIGH (ref 8–23)
CO2: 26 mmol/L (ref 22–32)
Calcium: 8.8 mg/dL — ABNORMAL LOW (ref 8.9–10.3)
Chloride: 108 mmol/L (ref 98–111)
Creatinine, Ser: 1.03 mg/dL (ref 0.61–1.24)
GFR, Estimated: >60 mL/min (ref >60)
Glucose, Bld: 108 mg/dL — ABNORMAL HIGH (ref 70–99)
Potassium: 3.8 mmol/L (ref 3.5–5.1)
Sodium: 141 mmol/L (ref 135–145)

## 2023-06-16 NOTE — TOC Transition Note (Signed)
 Transition of Care [facility]) - Discharge Note   Patient Details  Name: Daniel [name] MRN: [id number] Date of Birth: [birth year]  [facility] CM/SW Contact:  Holland Lundborg, RN Phone Number: 06/16/2023, 9:44 AM   Clinical Narrative:     Patient with discharge planned for today.  Spoke with wife, confirms they are not able to pay copay days at [facility] and will be going home with home health, max services.  Cory with [facility] notified patient will need PT, OT, RN, SW, and Aide services.  Per wife, she and her sons will provide transportation back home.  They already have all DME needed.    Final next level of care: Home w Home Health Services Barriers to Discharge: Barriers Resolved   Patient Goals and CMS Choice Patient states their goals for this hospitalization and ongoing recovery are:: home with home health          Discharge Placement                       Discharge Plan and Services Additional resources added to the After Visit Summary for                            [facility] Arranged: PT, OT, Nurse's Aide, RN, Social Work [facility] Agency: [facility] Date [facility] Agency Contacted: 06/16/23 Time HH Agency Contacted: [phone] Representative spoke with at [facility] Agency: Randel Buss  Social Drivers of Health (SDOH) Interventions SDOH Screenings   Food Insecurity: No Food Insecurity (06/14/2023)  Housing: Low Risk  (06/14/2023)  Transportation Needs: No Transportation Needs (06/14/2023)  Utilities: Not At Risk (06/14/2023)  Financial Resource Strain: Low Risk  (02/26/2023)   Received from [facility]  Social Connections: Moderately Isolated (06/14/2023)  Tobacco Use: Low Risk  (06/13/2023)     Readmission Risk Interventions     No data to display

## 2023-06-16 NOTE — Discharge Summary (Signed)
 Physician Discharge Summary   Patient: Daniel [name] MRN: [id number] DOB: 1940-12-11  Admit date:     06/13/2023  Discharge date: 06/16/23  Discharge Physician: Lorita Rosa   PCP: Lyle San, MD   Recommendations at discharge:   You are being discharged with home health physical and occupational therapy, skilled nursing recommended but declined Follow up with Dr. Dean Every this week; call for an appointment  Discharge Diagnoses: Principal Problem:   Campylobacter diarrhea Active Problems:   Atrial fibrillation with rapid ventricular response (HCC)   Parkinson's disease (HCC)   Glaucoma   History of subdural hematoma (post traumatic)   Pressure injury of right heel, stage 2 (HCC)   Generalized weakness    Hospital Course: 83yo with h/o afib, CAD, HTN, Parkinson's disease, and dysphagia who presented on 5/22 with diarrhea.  He was last hospitalized last month for a hip fracture and was discharged to SNF rehab, went home 5 days PTA.  In the ER, he was noted to be in afib with RVR and given a Diltiazem  bolus with improvement.  Diarrhea is improving.    Assessment and Plan:  Acute diarrhea due to Campylobacter Patient presented with diarrhea C diff negative GI pathogen panel + Campylobacter Clear liquids -> full liquids -> soft diet Treatment is not required for uncomplicated cases unless symptoms persist for >1 week BPs have stabilized   Atrial fibrillation with rapid ventricular response  Received a diltiazem  10 mg IV bolus in the ED Continue home metoprolol  Not currently on anticoagulation Has pacemaker   Parkinson's disease  Continue Sinemet  Mildly confused this AM   Glaucoma Continue latanoprost    H/o SDH SDH in 02/2022 and hip fracture after a fall in 03/2022 Appears to be stable   Pressure ulcer, POA Pressure Injury 06/15/23 Heel Right Stage 2 (Active)  06/15/23 1041  Location: Heel  Location Orientation: Right  Staging:   Wound Description  (Comments): Stage 2  Present on Admission:     Generalized weakness Patient only released from SNF rehab 5 days PTA and then developed fulminant diarrhea He is better now from a diarrhea standpoint but still quite weak PT/OT recommending SNF again but he is in co-pay days and wife reports that they cannot afford to pay $350 per day Will keep for an additional night and encourage OOB to chair and mobility when possible Dc to home on 5/25           Consultants: None   Procedures: None   Antibiotics: None  Pain control - [place]  Controlled Substance Reporting System database was reviewed. and patient was instructed, not to drive, operate heavy machinery, perform activities at heights, swimming or participation in water activities or provide baby-sitting services while on Pain, Sleep and Anxiety Medications; until their outpatient Physician has advised to do so again. Also recommended to not to take more than prescribed Pain, Sleep and Anxiety Medications.    Disposition: Home Diet recommendation:  Regular diet DISCHARGE MEDICATION: Allergies as of 06/16/2023       Reactions   Clindamycin /lincomycin Hives   Clindamycin  Hives   Doxycycline Rash   Hydrocodone -acetaminophen  Rash   Levaquin [levofloxacin] Rash        Medication List     TAKE these medications    acetaminophen  325 MG tablet Commonly known as: TYLENOL  Take 1-2 tablets (325-650 mg total) by mouth every 6 (six) hours as needed for mild pain (pain score 1-3) (or temp > 100.5).   bisacodyl  10 MG suppository Commonly known  as: DULCOLAX Place 1 suppository (10 mg total) rectally daily as needed for moderate constipation.   carbidopa -levodopa  50-200 MG tablet Commonly known as: SINEMET  CR Take 1 tablet by mouth at bedtime.   carbidopa -levodopa  25-100 MG tablet Commonly known as: SINEMET  IR Take 2 tablets by mouth with breakfast, with lunch, and with evening meal.   cholecalciferol 25 MCG (1000  UNIT) tablet Commonly known as: VITAMIN D3 Take 1,000 Units by mouth daily.   cyanocobalamin  1000 MCG tablet Take 1,000 mcg by mouth daily.   dapsone 25 MG tablet Take 25 mg by mouth 3 (three) times a week.   docusate sodium  100 MG capsule Commonly known as: COLACE Take 1 capsule (100 mg total) by mouth 2 (two) times daily.   latanoprost 0.005 % ophthalmic solution Commonly known as: XALATAN Place 1 drop into both eyes at bedtime.   magnesium  hydroxide 400 MG/5ML suspension Commonly known as: MILK OF MAGNESIA Take 30 mLs by mouth daily as needed for mild constipation.   metoprolol  succinate 25 MG 24 hr tablet Commonly known as: TOPROL -XL Take 0.5 tablets (12.5 mg total) by mouth daily. ** DO NOT CRUSH **    (BETA BLOCKER)  - HOLD FOR SBP <110, DBP <70   sodium phosphate  Enem Place 133 mLs (1 enema total) rectally once as needed for severe constipation.        Discharge Exam:    Subjective: Denies pain today, mildly confused but oriented x 3 (I had just awakened him).  One formed BM last night.   Objective: Vitals:   06/16/23 0640 06/16/23 0904  BP: 124/70   Pulse: 66 60  Resp: 20   Temp: 98.2 F (36.8 C)   SpO2: 95%     Intake/Output Summary (Last 24 hours) at 06/16/2023 1111 Last data filed at 06/16/2023 0900 Gross per 24 hour  Intake 150 ml  Output 1550 ml  Net -1400 ml   Filed Weights   06/13/23 1844  Weight: 66.7 kg    Exam:  General:  Appears calm and comfortable and is in NAD Eyes:  normal lids, iris ENT:  grossly normal hearing, lips & tongue, mmm Cardiovascular:  RRR. No LE edema.  Respiratory:   CTA bilaterally with no wheezes/rales/rhonchi.  Normal respiratory effort. Abdomen:  soft, generally NT, ND Skin:  no rash or induration seen on limited exam Musculoskeletal:  +muscular rigidity Psychiatric:  flat mood and affect, speech sparse but appropriate, A&O x 3 Neurologic:  no gross abnormalities noted  Data Reviewed: I have reviewed  the patient's lab results since admission.  Pertinent labs for today include:   BMP stable CBC stable    Condition at discharge: fair  The results of significant diagnostics from this hospitalization (including imaging, microbiology, ancillary and laboratory) are listed below for reference.   Imaging Studies: CT ABDOMEN PELVIS W CONTRAST Result Date: 06/14/2023 CLINICAL DATA:  Weakness and diarrhea EXAM: CT ABDOMEN AND PELVIS WITH CONTRAST TECHNIQUE: Multidetector CT imaging of the abdomen and pelvis was performed using the standard protocol following bolus administration of intravenous contrast. RADIATION DOSE REDUCTION: This exam was performed according to the departmental dose-optimization program which includes automated exposure control, adjustment of the mA and/or kV according to patient size and/or use of iterative reconstruction technique. CONTRAST:  100mL OMNIPAQUE  IOHEXOL  300 MG/ML  SOLN COMPARISON:  None Available. FINDINGS: Lower chest: No acute abnormality. Hepatobiliary: No focal liver abnormality is seen. No gallstones, gallbladder wall thickening, or biliary dilatation. Pancreas: Unremarkable. No pancreatic ductal dilatation or  surrounding inflammatory changes. Spleen: Normal in size without focal abnormality. Adrenals/Urinary Tract: Adrenal glands are within normal limits. Kidneys demonstrate normal enhancement pattern bilaterally. No renal calculi or obstructive changes are seen. The bladder is within normal limits. Stomach/Bowel: No obstructive or inflammatory changes of the colon are seen. Scattered fecal material is noted without obstructive change. Appendix has been surgically removed. Small bowel and stomach are unremarkable. Vascular/Lymphatic: Aortic atherosclerosis. No enlarged abdominal or pelvic lymph nodes. Reproductive: Prostate is unremarkable. Other: Fluid containing inguinal hernias are noted bilaterally. Musculoskeletal: Changes consistent with recent left hip  replacement are noted. Small amount of fluid is noted along the posterior aspect of the joint space measuring 4.0 x 2.2 cm likely related to a postoperative seroma. No definitive joint effusion is seen. No other bony abnormality is noted. IMPRESSION: Recent left hip replacement. Small postoperative seroma is noted in the soft tissues posteriorly. No other focal abnormality is noted. Electronically Signed   By: Violeta Grey M.D.   On: 06/14/2023 00:47   DG Chest 2 View Result Date: 06/13/2023 CLINICAL DATA:  Weakness. EXAM: CHEST - 2 VIEW COMPARISON:  06/22/2023. FINDINGS: The heart size and mediastinal contours are stable. There is atherosclerotic calcification of the aorta. No consolidation, effusion, or pneumothorax is seen. Bony structures are stable. A dual lead pacemaker is present over the left chest. IMPRESSION: No active cardiopulmonary disease. Electronically Signed   By: Wyvonnia Heimlich M.D.   On: 06/13/2023 19:52    Microbiology: Results for orders placed or performed during the hospital encounter of 06/13/23  C Difficile Quick Screen w PCR reflex     Status: None   Collection Time: 06/14/23  1:03 PM   Specimen: STOOL  Result Value Ref Range Status   C Diff antigen NEGATIVE NEGATIVE Final   C Diff toxin NEGATIVE NEGATIVE Final   C Diff interpretation No C. difficile detected.  Final    Comment: Performed at [facility], [address] [place], [place] [address]  Gastrointestinal Panel by PCR , Stool     Status: Abnormal   Collection Time: 06/14/23  1:03 PM   Specimen: STOOL  Result Value Ref Range Status   Campylobacter species DETECTED (A) NOT DETECTED Final    Comment: RESULT CALLED TO, READ BACK BY AND VERIFIED WITH: Vick Gram AT 1447 06/14/23.PMF    Plesimonas shigelloides NOT DETECTED NOT DETECTED Final   Salmonella species NOT DETECTED NOT DETECTED Final   Yersinia enterocolitica NOT DETECTED NOT DETECTED Final   Vibrio species NOT DETECTED NOT DETECTED Final    Vibrio cholerae NOT DETECTED NOT DETECTED Final   Enteroaggregative E coli (EAEC) NOT DETECTED NOT DETECTED Final   Enteropathogenic E coli (EPEC) NOT DETECTED NOT DETECTED Final   Enterotoxigenic E coli (ETEC) NOT DETECTED NOT DETECTED Final   Shiga like toxin producing E coli (STEC) NOT DETECTED NOT DETECTED Final   Shigella/Enteroinvasive E coli (EIEC) NOT DETECTED NOT DETECTED Final   Cryptosporidium NOT DETECTED NOT DETECTED Final   Cyclospora cayetanensis NOT DETECTED NOT DETECTED Final   Entamoeba histolytica NOT DETECTED NOT DETECTED Final   Giardia lamblia NOT DETECTED NOT DETECTED Final   Adenovirus F40/41 NOT DETECTED NOT DETECTED Final   Astrovirus NOT DETECTED NOT DETECTED Final   Norovirus GI/GII NOT DETECTED NOT DETECTED Final   Rotavirus A NOT DETECTED NOT DETECTED Final   Sapovirus (I, II, IV, and V) NOT DETECTED NOT DETECTED Final    Comment: Performed at [facility], [address]  [place], [place] [address]    Labs: CBC: Recent Labs  Lab 06/13/23 1848 06/14/23 0457 06/15/23 0528 06/16/23 0434  WBC 8.0 6.4 6.2 6.4  NEUTROABS  --   --   --  4.2  HGB 12.1* 10.5* 10.1* 10.7*  HCT 37.7* 32.7* 31.0* 33.4*  MCV 85.7 85.2 85.2 83.5  PLT 382 305 307 309   Basic Metabolic Panel: Recent Labs  Lab 06/13/23 1848 06/14/23 0457 06/15/23 0528 06/16/23 0434  NA 136 138 139 141  K 4.3 3.7 3.8 3.8  CL 103 106 107 108  CO2 25 23 24 26   GLUCOSE 114* 99 92 108*  BUN 40* 39* 27* 24*  CREATININE 1.07 0.94 0.87 1.03  CALCIUM 9.3 8.7* 8.9 8.8*   Liver Function Tests: Recent Labs  Lab 06/13/23 1848  AST 20  ALT 11  ALKPHOS 106  BILITOT 0.6  PROT 7.3  ALBUMIN 3.5   CBG: No results for input(s): "GLUCAP" in the last 168 hours.  Discharge time spent: greater than 30 minutes.  Signed: Lorita Rosa, MD [facility] 06/16/2023

## 2023-06-20 DIAGNOSIS — E538 Deficiency of other specified B group vitamins: Secondary | ICD-10-CM | POA: Diagnosis not present

## 2023-06-20 DIAGNOSIS — E559 Vitamin D deficiency, unspecified: Secondary | ICD-10-CM | POA: Diagnosis not present

## 2023-06-20 DIAGNOSIS — I10 Essential (primary) hypertension: Secondary | ICD-10-CM | POA: Diagnosis not present

## 2023-06-20 DIAGNOSIS — I2510 Atherosclerotic heart disease of native coronary artery without angina pectoris: Secondary | ICD-10-CM | POA: Diagnosis not present

## 2023-06-20 DIAGNOSIS — M96842 Postprocedural seroma of a musculoskeletal structure following a musculoskeletal system procedure: Secondary | ICD-10-CM | POA: Diagnosis not present

## 2023-06-20 DIAGNOSIS — R1310 Dysphagia, unspecified: Secondary | ICD-10-CM | POA: Diagnosis not present

## 2023-06-20 DIAGNOSIS — S72002D Fracture of unspecified part of neck of left femur, subsequent encounter for closed fracture with routine healing: Secondary | ICD-10-CM | POA: Diagnosis not present

## 2023-06-20 DIAGNOSIS — I4891 Unspecified atrial fibrillation: Secondary | ICD-10-CM | POA: Diagnosis not present

## 2023-06-20 DIAGNOSIS — G20A1 Parkinson's disease without dyskinesia, without mention of fluctuations: Secondary | ICD-10-CM | POA: Diagnosis not present

## 2023-06-21 DIAGNOSIS — Z96642 Presence of left artificial hip joint: Secondary | ICD-10-CM | POA: Diagnosis not present

## 2023-06-21 DIAGNOSIS — I4891 Unspecified atrial fibrillation: Secondary | ICD-10-CM | POA: Diagnosis not present

## 2023-06-21 DIAGNOSIS — I2510 Atherosclerotic heart disease of native coronary artery without angina pectoris: Secondary | ICD-10-CM | POA: Diagnosis not present

## 2023-06-21 DIAGNOSIS — S72002D Fracture of unspecified part of neck of left femur, subsequent encounter for closed fracture with routine healing: Secondary | ICD-10-CM | POA: Diagnosis not present

## 2023-06-21 DIAGNOSIS — M96842 Postprocedural seroma of a musculoskeletal structure following a musculoskeletal system procedure: Secondary | ICD-10-CM | POA: Diagnosis not present

## 2023-06-21 DIAGNOSIS — E538 Deficiency of other specified B group vitamins: Secondary | ICD-10-CM | POA: Diagnosis not present

## 2023-06-21 DIAGNOSIS — R1310 Dysphagia, unspecified: Secondary | ICD-10-CM | POA: Diagnosis not present

## 2023-06-21 DIAGNOSIS — G20A1 Parkinson's disease without dyskinesia, without mention of fluctuations: Secondary | ICD-10-CM | POA: Diagnosis not present

## 2023-06-21 DIAGNOSIS — E559 Vitamin D deficiency, unspecified: Secondary | ICD-10-CM | POA: Diagnosis not present

## 2023-06-21 DIAGNOSIS — I10 Essential (primary) hypertension: Secondary | ICD-10-CM | POA: Diagnosis not present

## 2023-06-24 DIAGNOSIS — E559 Vitamin D deficiency, unspecified: Secondary | ICD-10-CM | POA: Diagnosis not present

## 2023-06-24 DIAGNOSIS — I10 Essential (primary) hypertension: Secondary | ICD-10-CM | POA: Diagnosis not present

## 2023-06-24 DIAGNOSIS — I4891 Unspecified atrial fibrillation: Secondary | ICD-10-CM | POA: Diagnosis not present

## 2023-06-24 DIAGNOSIS — S72002D Fracture of unspecified part of neck of left femur, subsequent encounter for closed fracture with routine healing: Secondary | ICD-10-CM | POA: Diagnosis not present

## 2023-06-24 DIAGNOSIS — I2510 Atherosclerotic heart disease of native coronary artery without angina pectoris: Secondary | ICD-10-CM | POA: Diagnosis not present

## 2023-06-24 DIAGNOSIS — G20A1 Parkinson's disease without dyskinesia, without mention of fluctuations: Secondary | ICD-10-CM | POA: Diagnosis not present

## 2023-06-24 DIAGNOSIS — E538 Deficiency of other specified B group vitamins: Secondary | ICD-10-CM | POA: Diagnosis not present

## 2023-06-24 DIAGNOSIS — R1310 Dysphagia, unspecified: Secondary | ICD-10-CM | POA: Diagnosis not present

## 2023-06-24 DIAGNOSIS — M96842 Postprocedural seroma of a musculoskeletal structure following a musculoskeletal system procedure: Secondary | ICD-10-CM | POA: Diagnosis not present

## 2023-06-25 DIAGNOSIS — I10 Essential (primary) hypertension: Secondary | ICD-10-CM | POA: Diagnosis not present

## 2023-06-25 DIAGNOSIS — M96842 Postprocedural seroma of a musculoskeletal structure following a musculoskeletal system procedure: Secondary | ICD-10-CM | POA: Diagnosis not present

## 2023-06-25 DIAGNOSIS — E559 Vitamin D deficiency, unspecified: Secondary | ICD-10-CM | POA: Diagnosis not present

## 2023-06-25 DIAGNOSIS — S72002D Fracture of unspecified part of neck of left femur, subsequent encounter for closed fracture with routine healing: Secondary | ICD-10-CM | POA: Diagnosis not present

## 2023-06-25 DIAGNOSIS — R1310 Dysphagia, unspecified: Secondary | ICD-10-CM | POA: Diagnosis not present

## 2023-06-25 DIAGNOSIS — I2510 Atherosclerotic heart disease of native coronary artery without angina pectoris: Secondary | ICD-10-CM | POA: Diagnosis not present

## 2023-06-25 DIAGNOSIS — G20A1 Parkinson's disease without dyskinesia, without mention of fluctuations: Secondary | ICD-10-CM | POA: Diagnosis not present

## 2023-06-25 DIAGNOSIS — E538 Deficiency of other specified B group vitamins: Secondary | ICD-10-CM | POA: Diagnosis not present

## 2023-06-25 DIAGNOSIS — I4891 Unspecified atrial fibrillation: Secondary | ICD-10-CM | POA: Diagnosis not present

## 2023-06-26 DIAGNOSIS — S72002D Fracture of unspecified part of neck of left femur, subsequent encounter for closed fracture with routine healing: Secondary | ICD-10-CM | POA: Diagnosis not present

## 2023-06-26 DIAGNOSIS — G20A1 Parkinson's disease without dyskinesia, without mention of fluctuations: Secondary | ICD-10-CM | POA: Diagnosis not present

## 2023-06-26 DIAGNOSIS — M96842 Postprocedural seroma of a musculoskeletal structure following a musculoskeletal system procedure: Secondary | ICD-10-CM | POA: Diagnosis not present

## 2023-06-26 DIAGNOSIS — I4891 Unspecified atrial fibrillation: Secondary | ICD-10-CM | POA: Diagnosis not present

## 2023-06-26 DIAGNOSIS — E538 Deficiency of other specified B group vitamins: Secondary | ICD-10-CM | POA: Diagnosis not present

## 2023-06-26 DIAGNOSIS — E559 Vitamin D deficiency, unspecified: Secondary | ICD-10-CM | POA: Diagnosis not present

## 2023-06-26 DIAGNOSIS — I10 Essential (primary) hypertension: Secondary | ICD-10-CM | POA: Diagnosis not present

## 2023-06-26 DIAGNOSIS — I2510 Atherosclerotic heart disease of native coronary artery without angina pectoris: Secondary | ICD-10-CM | POA: Diagnosis not present

## 2023-06-26 DIAGNOSIS — R1310 Dysphagia, unspecified: Secondary | ICD-10-CM | POA: Diagnosis not present

## 2023-06-27 DIAGNOSIS — S72002D Fracture of unspecified part of neck of left femur, subsequent encounter for closed fracture with routine healing: Secondary | ICD-10-CM | POA: Diagnosis not present

## 2023-06-27 DIAGNOSIS — E538 Deficiency of other specified B group vitamins: Secondary | ICD-10-CM | POA: Diagnosis not present

## 2023-06-27 DIAGNOSIS — M96842 Postprocedural seroma of a musculoskeletal structure following a musculoskeletal system procedure: Secondary | ICD-10-CM | POA: Diagnosis not present

## 2023-06-27 DIAGNOSIS — I2510 Atherosclerotic heart disease of native coronary artery without angina pectoris: Secondary | ICD-10-CM | POA: Diagnosis not present

## 2023-06-27 DIAGNOSIS — I4891 Unspecified atrial fibrillation: Secondary | ICD-10-CM | POA: Diagnosis not present

## 2023-06-27 DIAGNOSIS — E559 Vitamin D deficiency, unspecified: Secondary | ICD-10-CM | POA: Diagnosis not present

## 2023-06-27 DIAGNOSIS — I10 Essential (primary) hypertension: Secondary | ICD-10-CM | POA: Diagnosis not present

## 2023-06-27 DIAGNOSIS — R1310 Dysphagia, unspecified: Secondary | ICD-10-CM | POA: Diagnosis not present

## 2023-06-27 DIAGNOSIS — G20A1 Parkinson's disease without dyskinesia, without mention of fluctuations: Secondary | ICD-10-CM | POA: Diagnosis not present

## 2023-07-01 DIAGNOSIS — I10 Essential (primary) hypertension: Secondary | ICD-10-CM | POA: Diagnosis not present

## 2023-07-01 DIAGNOSIS — E538 Deficiency of other specified B group vitamins: Secondary | ICD-10-CM | POA: Diagnosis not present

## 2023-07-01 DIAGNOSIS — I4891 Unspecified atrial fibrillation: Secondary | ICD-10-CM | POA: Diagnosis not present

## 2023-07-01 DIAGNOSIS — I2510 Atherosclerotic heart disease of native coronary artery without angina pectoris: Secondary | ICD-10-CM | POA: Diagnosis not present

## 2023-07-01 DIAGNOSIS — S72002D Fracture of unspecified part of neck of left femur, subsequent encounter for closed fracture with routine healing: Secondary | ICD-10-CM | POA: Diagnosis not present

## 2023-07-01 DIAGNOSIS — M96842 Postprocedural seroma of a musculoskeletal structure following a musculoskeletal system procedure: Secondary | ICD-10-CM | POA: Diagnosis not present

## 2023-07-01 DIAGNOSIS — E559 Vitamin D deficiency, unspecified: Secondary | ICD-10-CM | POA: Diagnosis not present

## 2023-07-01 DIAGNOSIS — G20A1 Parkinson's disease without dyskinesia, without mention of fluctuations: Secondary | ICD-10-CM | POA: Diagnosis not present

## 2023-07-01 DIAGNOSIS — R1310 Dysphagia, unspecified: Secondary | ICD-10-CM | POA: Diagnosis not present

## 2023-07-02 DIAGNOSIS — E538 Deficiency of other specified B group vitamins: Secondary | ICD-10-CM | POA: Diagnosis not present

## 2023-07-02 DIAGNOSIS — R1310 Dysphagia, unspecified: Secondary | ICD-10-CM | POA: Diagnosis not present

## 2023-07-02 DIAGNOSIS — I4891 Unspecified atrial fibrillation: Secondary | ICD-10-CM | POA: Diagnosis not present

## 2023-07-02 DIAGNOSIS — I2510 Atherosclerotic heart disease of native coronary artery without angina pectoris: Secondary | ICD-10-CM | POA: Diagnosis not present

## 2023-07-02 DIAGNOSIS — M96842 Postprocedural seroma of a musculoskeletal structure following a musculoskeletal system procedure: Secondary | ICD-10-CM | POA: Diagnosis not present

## 2023-07-02 DIAGNOSIS — G20A1 Parkinson's disease without dyskinesia, without mention of fluctuations: Secondary | ICD-10-CM | POA: Diagnosis not present

## 2023-07-02 DIAGNOSIS — E559 Vitamin D deficiency, unspecified: Secondary | ICD-10-CM | POA: Diagnosis not present

## 2023-07-02 DIAGNOSIS — I10 Essential (primary) hypertension: Secondary | ICD-10-CM | POA: Diagnosis not present

## 2023-07-02 DIAGNOSIS — S72002D Fracture of unspecified part of neck of left femur, subsequent encounter for closed fracture with routine healing: Secondary | ICD-10-CM | POA: Diagnosis not present

## 2023-07-03 DIAGNOSIS — E538 Deficiency of other specified B group vitamins: Secondary | ICD-10-CM | POA: Diagnosis not present

## 2023-07-03 DIAGNOSIS — R1310 Dysphagia, unspecified: Secondary | ICD-10-CM | POA: Diagnosis not present

## 2023-07-03 DIAGNOSIS — S72002D Fracture of unspecified part of neck of left femur, subsequent encounter for closed fracture with routine healing: Secondary | ICD-10-CM | POA: Diagnosis not present

## 2023-07-03 DIAGNOSIS — M96842 Postprocedural seroma of a musculoskeletal structure following a musculoskeletal system procedure: Secondary | ICD-10-CM | POA: Diagnosis not present

## 2023-07-03 DIAGNOSIS — I10 Essential (primary) hypertension: Secondary | ICD-10-CM | POA: Diagnosis not present

## 2023-07-03 DIAGNOSIS — I4891 Unspecified atrial fibrillation: Secondary | ICD-10-CM | POA: Diagnosis not present

## 2023-07-03 DIAGNOSIS — I2510 Atherosclerotic heart disease of native coronary artery without angina pectoris: Secondary | ICD-10-CM | POA: Diagnosis not present

## 2023-07-03 DIAGNOSIS — E559 Vitamin D deficiency, unspecified: Secondary | ICD-10-CM | POA: Diagnosis not present

## 2023-07-03 DIAGNOSIS — G20A1 Parkinson's disease without dyskinesia, without mention of fluctuations: Secondary | ICD-10-CM | POA: Diagnosis not present

## 2023-07-03 LAB — MISCELLANEOUS TEST

## 2023-07-04 DIAGNOSIS — M96842 Postprocedural seroma of a musculoskeletal structure following a musculoskeletal system procedure: Secondary | ICD-10-CM | POA: Diagnosis not present

## 2023-07-04 DIAGNOSIS — G20A1 Parkinson's disease without dyskinesia, without mention of fluctuations: Secondary | ICD-10-CM | POA: Diagnosis not present

## 2023-07-04 DIAGNOSIS — I2510 Atherosclerotic heart disease of native coronary artery without angina pectoris: Secondary | ICD-10-CM | POA: Diagnosis not present

## 2023-07-04 DIAGNOSIS — S72002D Fracture of unspecified part of neck of left femur, subsequent encounter for closed fracture with routine healing: Secondary | ICD-10-CM | POA: Diagnosis not present

## 2023-07-04 DIAGNOSIS — I4891 Unspecified atrial fibrillation: Secondary | ICD-10-CM | POA: Diagnosis not present

## 2023-07-04 DIAGNOSIS — E559 Vitamin D deficiency, unspecified: Secondary | ICD-10-CM | POA: Diagnosis not present

## 2023-07-04 DIAGNOSIS — R1310 Dysphagia, unspecified: Secondary | ICD-10-CM | POA: Diagnosis not present

## 2023-07-04 DIAGNOSIS — I10 Essential (primary) hypertension: Secondary | ICD-10-CM | POA: Diagnosis not present

## 2023-07-04 DIAGNOSIS — E538 Deficiency of other specified B group vitamins: Secondary | ICD-10-CM | POA: Diagnosis not present

## 2023-07-05 DIAGNOSIS — I10 Essential (primary) hypertension: Secondary | ICD-10-CM | POA: Diagnosis not present

## 2023-07-05 DIAGNOSIS — G20A1 Parkinson's disease without dyskinesia, without mention of fluctuations: Secondary | ICD-10-CM | POA: Diagnosis not present

## 2023-07-05 DIAGNOSIS — E538 Deficiency of other specified B group vitamins: Secondary | ICD-10-CM | POA: Diagnosis not present

## 2023-07-05 DIAGNOSIS — I2510 Atherosclerotic heart disease of native coronary artery without angina pectoris: Secondary | ICD-10-CM | POA: Diagnosis not present

## 2023-07-05 DIAGNOSIS — S72002D Fracture of unspecified part of neck of left femur, subsequent encounter for closed fracture with routine healing: Secondary | ICD-10-CM | POA: Diagnosis not present

## 2023-07-05 DIAGNOSIS — I4891 Unspecified atrial fibrillation: Secondary | ICD-10-CM | POA: Diagnosis not present

## 2023-07-05 DIAGNOSIS — R1310 Dysphagia, unspecified: Secondary | ICD-10-CM | POA: Diagnosis not present

## 2023-07-05 DIAGNOSIS — M96842 Postprocedural seroma of a musculoskeletal structure following a musculoskeletal system procedure: Secondary | ICD-10-CM | POA: Diagnosis not present

## 2023-07-05 DIAGNOSIS — E559 Vitamin D deficiency, unspecified: Secondary | ICD-10-CM | POA: Diagnosis not present

## 2023-07-08 DIAGNOSIS — R1310 Dysphagia, unspecified: Secondary | ICD-10-CM | POA: Diagnosis not present

## 2023-07-08 DIAGNOSIS — I2510 Atherosclerotic heart disease of native coronary artery without angina pectoris: Secondary | ICD-10-CM | POA: Diagnosis not present

## 2023-07-08 DIAGNOSIS — E559 Vitamin D deficiency, unspecified: Secondary | ICD-10-CM | POA: Diagnosis not present

## 2023-07-08 DIAGNOSIS — I4891 Unspecified atrial fibrillation: Secondary | ICD-10-CM | POA: Diagnosis not present

## 2023-07-08 DIAGNOSIS — S72002D Fracture of unspecified part of neck of left femur, subsequent encounter for closed fracture with routine healing: Secondary | ICD-10-CM | POA: Diagnosis not present

## 2023-07-08 DIAGNOSIS — E538 Deficiency of other specified B group vitamins: Secondary | ICD-10-CM | POA: Diagnosis not present

## 2023-07-08 DIAGNOSIS — M96842 Postprocedural seroma of a musculoskeletal structure following a musculoskeletal system procedure: Secondary | ICD-10-CM | POA: Diagnosis not present

## 2023-07-08 DIAGNOSIS — G20A1 Parkinson's disease without dyskinesia, without mention of fluctuations: Secondary | ICD-10-CM | POA: Diagnosis not present

## 2023-07-08 DIAGNOSIS — I10 Essential (primary) hypertension: Secondary | ICD-10-CM | POA: Diagnosis not present

## 2023-07-09 DIAGNOSIS — M96842 Postprocedural seroma of a musculoskeletal structure following a musculoskeletal system procedure: Secondary | ICD-10-CM | POA: Diagnosis not present

## 2023-07-09 DIAGNOSIS — E559 Vitamin D deficiency, unspecified: Secondary | ICD-10-CM | POA: Diagnosis not present

## 2023-07-09 DIAGNOSIS — R1310 Dysphagia, unspecified: Secondary | ICD-10-CM | POA: Diagnosis not present

## 2023-07-09 DIAGNOSIS — S72002D Fracture of unspecified part of neck of left femur, subsequent encounter for closed fracture with routine healing: Secondary | ICD-10-CM | POA: Diagnosis not present

## 2023-07-09 DIAGNOSIS — I2510 Atherosclerotic heart disease of native coronary artery without angina pectoris: Secondary | ICD-10-CM | POA: Diagnosis not present

## 2023-07-09 DIAGNOSIS — I4891 Unspecified atrial fibrillation: Secondary | ICD-10-CM | POA: Diagnosis not present

## 2023-07-09 DIAGNOSIS — I10 Essential (primary) hypertension: Secondary | ICD-10-CM | POA: Diagnosis not present

## 2023-07-09 DIAGNOSIS — G20A1 Parkinson's disease without dyskinesia, without mention of fluctuations: Secondary | ICD-10-CM | POA: Diagnosis not present

## 2023-07-09 DIAGNOSIS — E538 Deficiency of other specified B group vitamins: Secondary | ICD-10-CM | POA: Diagnosis not present

## 2023-07-10 ENCOUNTER — Other Ambulatory Visit: Payer: Self-pay

## 2023-07-10 DIAGNOSIS — G20C Parkinsonism, unspecified: Secondary | ICD-10-CM

## 2023-07-10 DIAGNOSIS — I10 Essential (primary) hypertension: Secondary | ICD-10-CM | POA: Diagnosis not present

## 2023-07-10 DIAGNOSIS — A045 Campylobacter enteritis: Secondary | ICD-10-CM | POA: Diagnosis not present

## 2023-07-10 DIAGNOSIS — S72002D Fracture of unspecified part of neck of left femur, subsequent encounter for closed fracture with routine healing: Secondary | ICD-10-CM | POA: Diagnosis not present

## 2023-07-10 DIAGNOSIS — I4891 Unspecified atrial fibrillation: Secondary | ICD-10-CM | POA: Diagnosis not present

## 2023-07-10 DIAGNOSIS — E538 Deficiency of other specified B group vitamins: Secondary | ICD-10-CM | POA: Diagnosis not present

## 2023-07-10 DIAGNOSIS — I2510 Atherosclerotic heart disease of native coronary artery without angina pectoris: Secondary | ICD-10-CM | POA: Diagnosis not present

## 2023-07-10 DIAGNOSIS — R296 Repeated falls: Secondary | ICD-10-CM

## 2023-07-10 DIAGNOSIS — S065XAA Traumatic subdural hemorrhage with loss of consciousness status unknown, initial encounter: Secondary | ICD-10-CM

## 2023-07-10 DIAGNOSIS — R1310 Dysphagia, unspecified: Secondary | ICD-10-CM | POA: Diagnosis not present

## 2023-07-10 DIAGNOSIS — E559 Vitamin D deficiency, unspecified: Secondary | ICD-10-CM | POA: Diagnosis not present

## 2023-07-10 DIAGNOSIS — G20A1 Parkinson's disease without dyskinesia, without mention of fluctuations: Secondary | ICD-10-CM | POA: Diagnosis not present

## 2023-07-11 DIAGNOSIS — G20A1 Parkinson's disease without dyskinesia, without mention of fluctuations: Secondary | ICD-10-CM | POA: Diagnosis not present

## 2023-07-11 DIAGNOSIS — A045 Campylobacter enteritis: Secondary | ICD-10-CM | POA: Diagnosis not present

## 2023-07-11 DIAGNOSIS — E538 Deficiency of other specified B group vitamins: Secondary | ICD-10-CM | POA: Diagnosis not present

## 2023-07-11 DIAGNOSIS — R1310 Dysphagia, unspecified: Secondary | ICD-10-CM | POA: Diagnosis not present

## 2023-07-11 DIAGNOSIS — E559 Vitamin D deficiency, unspecified: Secondary | ICD-10-CM | POA: Diagnosis not present

## 2023-07-11 DIAGNOSIS — I2510 Atherosclerotic heart disease of native coronary artery without angina pectoris: Secondary | ICD-10-CM | POA: Diagnosis not present

## 2023-07-11 DIAGNOSIS — S72002D Fracture of unspecified part of neck of left femur, subsequent encounter for closed fracture with routine healing: Secondary | ICD-10-CM | POA: Diagnosis not present

## 2023-07-11 DIAGNOSIS — I4891 Unspecified atrial fibrillation: Secondary | ICD-10-CM | POA: Diagnosis not present

## 2023-07-11 DIAGNOSIS — I10 Essential (primary) hypertension: Secondary | ICD-10-CM | POA: Diagnosis not present

## 2023-07-12 ENCOUNTER — Ambulatory Visit

## 2023-07-12 DIAGNOSIS — I10 Essential (primary) hypertension: Secondary | ICD-10-CM | POA: Diagnosis not present

## 2023-07-12 DIAGNOSIS — I4891 Unspecified atrial fibrillation: Secondary | ICD-10-CM | POA: Diagnosis not present

## 2023-07-12 DIAGNOSIS — E538 Deficiency of other specified B group vitamins: Secondary | ICD-10-CM | POA: Diagnosis not present

## 2023-07-12 DIAGNOSIS — I2510 Atherosclerotic heart disease of native coronary artery without angina pectoris: Secondary | ICD-10-CM | POA: Diagnosis not present

## 2023-07-12 DIAGNOSIS — A045 Campylobacter enteritis: Secondary | ICD-10-CM | POA: Diagnosis not present

## 2023-07-12 DIAGNOSIS — R1310 Dysphagia, unspecified: Secondary | ICD-10-CM | POA: Diagnosis not present

## 2023-07-12 DIAGNOSIS — S72002D Fracture of unspecified part of neck of left femur, subsequent encounter for closed fracture with routine healing: Secondary | ICD-10-CM | POA: Diagnosis not present

## 2023-07-12 DIAGNOSIS — G20A1 Parkinson's disease without dyskinesia, without mention of fluctuations: Secondary | ICD-10-CM | POA: Diagnosis not present

## 2023-07-12 DIAGNOSIS — E559 Vitamin D deficiency, unspecified: Secondary | ICD-10-CM | POA: Diagnosis not present

## 2023-07-15 DIAGNOSIS — L97521 Non-pressure chronic ulcer of other part of left foot limited to breakdown of skin: Secondary | ICD-10-CM | POA: Diagnosis not present

## 2023-07-15 DIAGNOSIS — G20C Parkinsonism, unspecified: Secondary | ICD-10-CM | POA: Diagnosis not present

## 2023-07-16 ENCOUNTER — Ambulatory Visit
Admission: RE | Admit: 2023-07-16 | Discharge: 2023-07-16 | Disposition: A | Discharge status: Home / Self Care | Source: Ambulatory Visit

## 2023-07-16 DIAGNOSIS — I4891 Unspecified atrial fibrillation: Secondary | ICD-10-CM | POA: Diagnosis not present

## 2023-07-16 DIAGNOSIS — R296 Repeated falls: Secondary | ICD-10-CM | POA: Insufficient documentation

## 2023-07-16 DIAGNOSIS — S065XAA Traumatic subdural hemorrhage with loss of consciousness status unknown, initial encounter: Secondary | ICD-10-CM | POA: Insufficient documentation

## 2023-07-16 DIAGNOSIS — A045 Campylobacter enteritis: Secondary | ICD-10-CM | POA: Diagnosis not present

## 2023-07-16 DIAGNOSIS — E559 Vitamin D deficiency, unspecified: Secondary | ICD-10-CM | POA: Diagnosis not present

## 2023-07-16 DIAGNOSIS — S065X0A Traumatic subdural hemorrhage without loss of consciousness, initial encounter: Secondary | ICD-10-CM | POA: Diagnosis not present

## 2023-07-16 DIAGNOSIS — I2510 Atherosclerotic heart disease of native coronary artery without angina pectoris: Secondary | ICD-10-CM | POA: Diagnosis not present

## 2023-07-16 DIAGNOSIS — G20C Parkinsonism, unspecified: Secondary | ICD-10-CM | POA: Insufficient documentation

## 2023-07-16 DIAGNOSIS — I10 Essential (primary) hypertension: Secondary | ICD-10-CM | POA: Diagnosis not present

## 2023-07-16 DIAGNOSIS — E538 Deficiency of other specified B group vitamins: Secondary | ICD-10-CM | POA: Diagnosis not present

## 2023-07-16 DIAGNOSIS — S72002D Fracture of unspecified part of neck of left femur, subsequent encounter for closed fracture with routine healing: Secondary | ICD-10-CM | POA: Diagnosis not present

## 2023-07-16 DIAGNOSIS — R1310 Dysphagia, unspecified: Secondary | ICD-10-CM | POA: Diagnosis not present

## 2023-07-16 DIAGNOSIS — G20A1 Parkinson's disease without dyskinesia, without mention of fluctuations: Secondary | ICD-10-CM | POA: Diagnosis not present

## 2023-07-17 DIAGNOSIS — G20C Parkinsonism, unspecified: Secondary | ICD-10-CM | POA: Diagnosis not present

## 2023-07-17 DIAGNOSIS — E538 Deficiency of other specified B group vitamins: Secondary | ICD-10-CM | POA: Diagnosis not present

## 2023-07-17 DIAGNOSIS — R7989 Other specified abnormal findings of blood chemistry: Secondary | ICD-10-CM | POA: Diagnosis not present

## 2023-07-17 DIAGNOSIS — R296 Repeated falls: Secondary | ICD-10-CM | POA: Diagnosis not present

## 2023-07-17 DIAGNOSIS — S065XAA Traumatic subdural hemorrhage with loss of consciousness status unknown, initial encounter: Secondary | ICD-10-CM | POA: Diagnosis not present

## 2023-07-17 DIAGNOSIS — G2581 Restless legs syndrome: Secondary | ICD-10-CM | POA: Diagnosis not present

## 2023-07-19 DIAGNOSIS — S72002D Fracture of unspecified part of neck of left femur, subsequent encounter for closed fracture with routine healing: Secondary | ICD-10-CM | POA: Diagnosis not present

## 2023-07-19 DIAGNOSIS — G20A1 Parkinson's disease without dyskinesia, without mention of fluctuations: Secondary | ICD-10-CM | POA: Diagnosis not present

## 2023-07-19 DIAGNOSIS — R1310 Dysphagia, unspecified: Secondary | ICD-10-CM | POA: Diagnosis not present

## 2023-07-19 DIAGNOSIS — I2510 Atherosclerotic heart disease of native coronary artery without angina pectoris: Secondary | ICD-10-CM | POA: Diagnosis not present

## 2023-07-19 DIAGNOSIS — E538 Deficiency of other specified B group vitamins: Secondary | ICD-10-CM | POA: Diagnosis not present

## 2023-07-19 DIAGNOSIS — E559 Vitamin D deficiency, unspecified: Secondary | ICD-10-CM | POA: Diagnosis not present

## 2023-07-19 DIAGNOSIS — I10 Essential (primary) hypertension: Secondary | ICD-10-CM | POA: Diagnosis not present

## 2023-07-19 DIAGNOSIS — A045 Campylobacter enteritis: Secondary | ICD-10-CM | POA: Diagnosis not present

## 2023-07-19 DIAGNOSIS — I4891 Unspecified atrial fibrillation: Secondary | ICD-10-CM | POA: Diagnosis not present

## 2023-07-22 DIAGNOSIS — E538 Deficiency of other specified B group vitamins: Secondary | ICD-10-CM | POA: Diagnosis not present

## 2023-07-22 DIAGNOSIS — I4891 Unspecified atrial fibrillation: Secondary | ICD-10-CM | POA: Diagnosis not present

## 2023-07-22 DIAGNOSIS — I10 Essential (primary) hypertension: Secondary | ICD-10-CM | POA: Diagnosis not present

## 2023-07-22 DIAGNOSIS — I2510 Atherosclerotic heart disease of native coronary artery without angina pectoris: Secondary | ICD-10-CM | POA: Diagnosis not present

## 2023-07-22 DIAGNOSIS — E559 Vitamin D deficiency, unspecified: Secondary | ICD-10-CM | POA: Diagnosis not present

## 2023-07-22 DIAGNOSIS — G20A1 Parkinson's disease without dyskinesia, without mention of fluctuations: Secondary | ICD-10-CM | POA: Diagnosis not present

## 2023-07-22 DIAGNOSIS — A045 Campylobacter enteritis: Secondary | ICD-10-CM | POA: Diagnosis not present

## 2023-07-22 DIAGNOSIS — R1310 Dysphagia, unspecified: Secondary | ICD-10-CM | POA: Diagnosis not present

## 2023-07-22 DIAGNOSIS — S72002D Fracture of unspecified part of neck of left femur, subsequent encounter for closed fracture with routine healing: Secondary | ICD-10-CM | POA: Diagnosis not present

## 2023-07-23 DIAGNOSIS — R1310 Dysphagia, unspecified: Secondary | ICD-10-CM | POA: Diagnosis not present

## 2023-07-23 DIAGNOSIS — I4891 Unspecified atrial fibrillation: Secondary | ICD-10-CM | POA: Diagnosis not present

## 2023-07-23 DIAGNOSIS — S72002D Fracture of unspecified part of neck of left femur, subsequent encounter for closed fracture with routine healing: Secondary | ICD-10-CM | POA: Diagnosis not present

## 2023-07-23 DIAGNOSIS — E559 Vitamin D deficiency, unspecified: Secondary | ICD-10-CM | POA: Diagnosis not present

## 2023-07-23 DIAGNOSIS — G20A1 Parkinson's disease without dyskinesia, without mention of fluctuations: Secondary | ICD-10-CM | POA: Diagnosis not present

## 2023-07-23 DIAGNOSIS — I10 Essential (primary) hypertension: Secondary | ICD-10-CM | POA: Diagnosis not present

## 2023-07-23 DIAGNOSIS — I2510 Atherosclerotic heart disease of native coronary artery without angina pectoris: Secondary | ICD-10-CM | POA: Diagnosis not present

## 2023-07-23 DIAGNOSIS — E538 Deficiency of other specified B group vitamins: Secondary | ICD-10-CM | POA: Diagnosis not present

## 2023-07-23 DIAGNOSIS — A045 Campylobacter enteritis: Secondary | ICD-10-CM | POA: Diagnosis not present

## 2023-07-24 DIAGNOSIS — I480 Paroxysmal atrial fibrillation: Secondary | ICD-10-CM | POA: Diagnosis not present

## 2023-07-24 DIAGNOSIS — I495 Sick sinus syndrome: Secondary | ICD-10-CM | POA: Diagnosis not present

## 2023-07-24 DIAGNOSIS — S065X0S Traumatic subdural hemorrhage without loss of consciousness, sequela: Secondary | ICD-10-CM | POA: Diagnosis not present

## 2023-07-24 DIAGNOSIS — Z9889 Other specified postprocedural states: Secondary | ICD-10-CM | POA: Diagnosis not present

## 2023-07-24 DIAGNOSIS — Z950 Presence of cardiac pacemaker: Secondary | ICD-10-CM | POA: Diagnosis not present

## 2023-07-25 DIAGNOSIS — G20A1 Parkinson's disease without dyskinesia, without mention of fluctuations: Secondary | ICD-10-CM | POA: Diagnosis not present

## 2023-07-25 DIAGNOSIS — I2510 Atherosclerotic heart disease of native coronary artery without angina pectoris: Secondary | ICD-10-CM | POA: Diagnosis not present

## 2023-07-25 DIAGNOSIS — R1310 Dysphagia, unspecified: Secondary | ICD-10-CM | POA: Diagnosis not present

## 2023-07-25 DIAGNOSIS — E559 Vitamin D deficiency, unspecified: Secondary | ICD-10-CM | POA: Diagnosis not present

## 2023-07-25 DIAGNOSIS — I10 Essential (primary) hypertension: Secondary | ICD-10-CM | POA: Diagnosis not present

## 2023-07-25 DIAGNOSIS — S72002D Fracture of unspecified part of neck of left femur, subsequent encounter for closed fracture with routine healing: Secondary | ICD-10-CM | POA: Diagnosis not present

## 2023-07-25 DIAGNOSIS — I4891 Unspecified atrial fibrillation: Secondary | ICD-10-CM | POA: Diagnosis not present

## 2023-07-25 DIAGNOSIS — E538 Deficiency of other specified B group vitamins: Secondary | ICD-10-CM | POA: Diagnosis not present

## 2023-07-25 DIAGNOSIS — A045 Campylobacter enteritis: Secondary | ICD-10-CM | POA: Diagnosis not present

## 2023-07-27 DIAGNOSIS — S72002D Fracture of unspecified part of neck of left femur, subsequent encounter for closed fracture with routine healing: Secondary | ICD-10-CM | POA: Diagnosis not present

## 2023-07-27 DIAGNOSIS — G20A1 Parkinson's disease without dyskinesia, without mention of fluctuations: Secondary | ICD-10-CM | POA: Diagnosis not present

## 2023-07-27 DIAGNOSIS — I10 Essential (primary) hypertension: Secondary | ICD-10-CM | POA: Diagnosis not present

## 2023-07-27 DIAGNOSIS — A045 Campylobacter enteritis: Secondary | ICD-10-CM | POA: Diagnosis not present

## 2023-07-27 DIAGNOSIS — E538 Deficiency of other specified B group vitamins: Secondary | ICD-10-CM | POA: Diagnosis not present

## 2023-07-27 DIAGNOSIS — R1310 Dysphagia, unspecified: Secondary | ICD-10-CM | POA: Diagnosis not present

## 2023-07-27 DIAGNOSIS — E559 Vitamin D deficiency, unspecified: Secondary | ICD-10-CM | POA: Diagnosis not present

## 2023-07-27 DIAGNOSIS — I4891 Unspecified atrial fibrillation: Secondary | ICD-10-CM | POA: Diagnosis not present

## 2023-07-27 DIAGNOSIS — I2510 Atherosclerotic heart disease of native coronary artery without angina pectoris: Secondary | ICD-10-CM | POA: Diagnosis not present

## 2023-07-30 DIAGNOSIS — I4891 Unspecified atrial fibrillation: Secondary | ICD-10-CM | POA: Diagnosis not present

## 2023-07-30 DIAGNOSIS — R1310 Dysphagia, unspecified: Secondary | ICD-10-CM | POA: Diagnosis not present

## 2023-07-30 DIAGNOSIS — S72002D Fracture of unspecified part of neck of left femur, subsequent encounter for closed fracture with routine healing: Secondary | ICD-10-CM | POA: Diagnosis not present

## 2023-07-30 DIAGNOSIS — A045 Campylobacter enteritis: Secondary | ICD-10-CM | POA: Diagnosis not present

## 2023-07-30 DIAGNOSIS — E538 Deficiency of other specified B group vitamins: Secondary | ICD-10-CM | POA: Diagnosis not present

## 2023-07-30 DIAGNOSIS — I2510 Atherosclerotic heart disease of native coronary artery without angina pectoris: Secondary | ICD-10-CM | POA: Diagnosis not present

## 2023-07-30 DIAGNOSIS — G20A1 Parkinson's disease without dyskinesia, without mention of fluctuations: Secondary | ICD-10-CM | POA: Diagnosis not present

## 2023-07-30 DIAGNOSIS — I10 Essential (primary) hypertension: Secondary | ICD-10-CM | POA: Diagnosis not present

## 2023-07-30 DIAGNOSIS — E559 Vitamin D deficiency, unspecified: Secondary | ICD-10-CM | POA: Diagnosis not present

## 2023-08-01 DIAGNOSIS — A045 Campylobacter enteritis: Secondary | ICD-10-CM | POA: Diagnosis not present

## 2023-08-01 DIAGNOSIS — E559 Vitamin D deficiency, unspecified: Secondary | ICD-10-CM | POA: Diagnosis not present

## 2023-08-01 DIAGNOSIS — I2510 Atherosclerotic heart disease of native coronary artery without angina pectoris: Secondary | ICD-10-CM | POA: Diagnosis not present

## 2023-08-01 DIAGNOSIS — S72002D Fracture of unspecified part of neck of left femur, subsequent encounter for closed fracture with routine healing: Secondary | ICD-10-CM | POA: Diagnosis not present

## 2023-08-01 DIAGNOSIS — E538 Deficiency of other specified B group vitamins: Secondary | ICD-10-CM | POA: Diagnosis not present

## 2023-08-01 DIAGNOSIS — R1310 Dysphagia, unspecified: Secondary | ICD-10-CM | POA: Diagnosis not present

## 2023-08-01 DIAGNOSIS — I4891 Unspecified atrial fibrillation: Secondary | ICD-10-CM | POA: Diagnosis not present

## 2023-08-01 DIAGNOSIS — G20A1 Parkinson's disease without dyskinesia, without mention of fluctuations: Secondary | ICD-10-CM | POA: Diagnosis not present

## 2023-08-01 DIAGNOSIS — I10 Essential (primary) hypertension: Secondary | ICD-10-CM | POA: Diagnosis not present

## 2023-08-02 DIAGNOSIS — Z96642 Presence of left artificial hip joint: Secondary | ICD-10-CM | POA: Diagnosis not present

## 2023-08-02 DIAGNOSIS — M24562 Contracture, left knee: Secondary | ICD-10-CM | POA: Diagnosis not present

## 2023-08-02 DIAGNOSIS — S72032S Displaced midcervical fracture of left femur, sequela: Secondary | ICD-10-CM | POA: Diagnosis not present

## 2023-08-03 DIAGNOSIS — I10 Essential (primary) hypertension: Secondary | ICD-10-CM | POA: Diagnosis not present

## 2023-08-03 DIAGNOSIS — G20A1 Parkinson's disease without dyskinesia, without mention of fluctuations: Secondary | ICD-10-CM | POA: Diagnosis not present

## 2023-08-03 DIAGNOSIS — R1310 Dysphagia, unspecified: Secondary | ICD-10-CM | POA: Diagnosis not present

## 2023-08-03 DIAGNOSIS — I2510 Atherosclerotic heart disease of native coronary artery without angina pectoris: Secondary | ICD-10-CM | POA: Diagnosis not present

## 2023-08-03 DIAGNOSIS — E559 Vitamin D deficiency, unspecified: Secondary | ICD-10-CM | POA: Diagnosis not present

## 2023-08-03 DIAGNOSIS — A045 Campylobacter enteritis: Secondary | ICD-10-CM | POA: Diagnosis not present

## 2023-08-03 DIAGNOSIS — S72002D Fracture of unspecified part of neck of left femur, subsequent encounter for closed fracture with routine healing: Secondary | ICD-10-CM | POA: Diagnosis not present

## 2023-08-03 DIAGNOSIS — E538 Deficiency of other specified B group vitamins: Secondary | ICD-10-CM | POA: Diagnosis not present

## 2023-08-03 DIAGNOSIS — I4891 Unspecified atrial fibrillation: Secondary | ICD-10-CM | POA: Diagnosis not present

## 2023-08-06 DIAGNOSIS — G20A1 Parkinson's disease without dyskinesia, without mention of fluctuations: Secondary | ICD-10-CM | POA: Diagnosis not present

## 2023-08-06 DIAGNOSIS — I10 Essential (primary) hypertension: Secondary | ICD-10-CM | POA: Diagnosis not present

## 2023-08-06 DIAGNOSIS — R1310 Dysphagia, unspecified: Secondary | ICD-10-CM | POA: Diagnosis not present

## 2023-08-06 DIAGNOSIS — E538 Deficiency of other specified B group vitamins: Secondary | ICD-10-CM | POA: Diagnosis not present

## 2023-08-06 DIAGNOSIS — I4891 Unspecified atrial fibrillation: Secondary | ICD-10-CM | POA: Diagnosis not present

## 2023-08-06 DIAGNOSIS — S72002D Fracture of unspecified part of neck of left femur, subsequent encounter for closed fracture with routine healing: Secondary | ICD-10-CM | POA: Diagnosis not present

## 2023-08-06 DIAGNOSIS — E559 Vitamin D deficiency, unspecified: Secondary | ICD-10-CM | POA: Diagnosis not present

## 2023-08-06 DIAGNOSIS — I2510 Atherosclerotic heart disease of native coronary artery without angina pectoris: Secondary | ICD-10-CM | POA: Diagnosis not present

## 2023-08-06 DIAGNOSIS — A045 Campylobacter enteritis: Secondary | ICD-10-CM | POA: Diagnosis not present

## 2023-08-07 DIAGNOSIS — A045 Campylobacter enteritis: Secondary | ICD-10-CM | POA: Diagnosis not present

## 2023-08-07 DIAGNOSIS — R1310 Dysphagia, unspecified: Secondary | ICD-10-CM | POA: Diagnosis not present

## 2023-08-07 DIAGNOSIS — I2510 Atherosclerotic heart disease of native coronary artery without angina pectoris: Secondary | ICD-10-CM | POA: Diagnosis not present

## 2023-08-07 DIAGNOSIS — I4891 Unspecified atrial fibrillation: Secondary | ICD-10-CM | POA: Diagnosis not present

## 2023-08-07 DIAGNOSIS — E559 Vitamin D deficiency, unspecified: Secondary | ICD-10-CM | POA: Diagnosis not present

## 2023-08-07 DIAGNOSIS — E538 Deficiency of other specified B group vitamins: Secondary | ICD-10-CM | POA: Diagnosis not present

## 2023-08-07 DIAGNOSIS — G20A1 Parkinson's disease without dyskinesia, without mention of fluctuations: Secondary | ICD-10-CM | POA: Diagnosis not present

## 2023-08-07 DIAGNOSIS — S72002D Fracture of unspecified part of neck of left femur, subsequent encounter for closed fracture with routine healing: Secondary | ICD-10-CM | POA: Diagnosis not present

## 2023-08-07 DIAGNOSIS — I10 Essential (primary) hypertension: Secondary | ICD-10-CM | POA: Diagnosis not present

## 2023-08-13 DIAGNOSIS — I10 Essential (primary) hypertension: Secondary | ICD-10-CM | POA: Diagnosis not present

## 2023-08-13 DIAGNOSIS — R1310 Dysphagia, unspecified: Secondary | ICD-10-CM | POA: Diagnosis not present

## 2023-08-13 DIAGNOSIS — S72002D Fracture of unspecified part of neck of left femur, subsequent encounter for closed fracture with routine healing: Secondary | ICD-10-CM | POA: Diagnosis not present

## 2023-08-13 DIAGNOSIS — G20A1 Parkinson's disease without dyskinesia, without mention of fluctuations: Secondary | ICD-10-CM | POA: Diagnosis not present

## 2023-08-13 DIAGNOSIS — E538 Deficiency of other specified B group vitamins: Secondary | ICD-10-CM | POA: Diagnosis not present

## 2023-08-13 DIAGNOSIS — E559 Vitamin D deficiency, unspecified: Secondary | ICD-10-CM | POA: Diagnosis not present

## 2023-08-13 DIAGNOSIS — I4891 Unspecified atrial fibrillation: Secondary | ICD-10-CM | POA: Diagnosis not present

## 2023-08-13 DIAGNOSIS — H409 Unspecified glaucoma: Secondary | ICD-10-CM | POA: Diagnosis not present

## 2023-08-13 DIAGNOSIS — I2510 Atherosclerotic heart disease of native coronary artery without angina pectoris: Secondary | ICD-10-CM | POA: Diagnosis not present

## 2023-08-14 DIAGNOSIS — H409 Unspecified glaucoma: Secondary | ICD-10-CM | POA: Diagnosis not present

## 2023-08-14 DIAGNOSIS — S72002D Fracture of unspecified part of neck of left femur, subsequent encounter for closed fracture with routine healing: Secondary | ICD-10-CM | POA: Diagnosis not present

## 2023-08-14 DIAGNOSIS — I4891 Unspecified atrial fibrillation: Secondary | ICD-10-CM | POA: Diagnosis not present

## 2023-08-14 DIAGNOSIS — R1310 Dysphagia, unspecified: Secondary | ICD-10-CM | POA: Diagnosis not present

## 2023-08-14 DIAGNOSIS — E559 Vitamin D deficiency, unspecified: Secondary | ICD-10-CM | POA: Diagnosis not present

## 2023-08-14 DIAGNOSIS — I10 Essential (primary) hypertension: Secondary | ICD-10-CM | POA: Diagnosis not present

## 2023-08-14 DIAGNOSIS — E538 Deficiency of other specified B group vitamins: Secondary | ICD-10-CM | POA: Diagnosis not present

## 2023-08-14 DIAGNOSIS — G20A1 Parkinson's disease without dyskinesia, without mention of fluctuations: Secondary | ICD-10-CM | POA: Diagnosis not present

## 2023-08-14 DIAGNOSIS — I2510 Atherosclerotic heart disease of native coronary artery without angina pectoris: Secondary | ICD-10-CM | POA: Diagnosis not present

## 2023-08-15 DIAGNOSIS — R1310 Dysphagia, unspecified: Secondary | ICD-10-CM | POA: Diagnosis not present

## 2023-08-15 DIAGNOSIS — H409 Unspecified glaucoma: Secondary | ICD-10-CM | POA: Diagnosis not present

## 2023-08-15 DIAGNOSIS — G20A1 Parkinson's disease without dyskinesia, without mention of fluctuations: Secondary | ICD-10-CM | POA: Diagnosis not present

## 2023-08-15 DIAGNOSIS — I10 Essential (primary) hypertension: Secondary | ICD-10-CM | POA: Diagnosis not present

## 2023-08-15 DIAGNOSIS — S72002D Fracture of unspecified part of neck of left femur, subsequent encounter for closed fracture with routine healing: Secondary | ICD-10-CM | POA: Diagnosis not present

## 2023-08-15 DIAGNOSIS — I2510 Atherosclerotic heart disease of native coronary artery without angina pectoris: Secondary | ICD-10-CM | POA: Diagnosis not present

## 2023-08-15 DIAGNOSIS — E559 Vitamin D deficiency, unspecified: Secondary | ICD-10-CM | POA: Diagnosis not present

## 2023-08-15 DIAGNOSIS — I4891 Unspecified atrial fibrillation: Secondary | ICD-10-CM | POA: Diagnosis not present

## 2023-08-15 DIAGNOSIS — E538 Deficiency of other specified B group vitamins: Secondary | ICD-10-CM | POA: Diagnosis not present

## 2023-08-16 DIAGNOSIS — R1310 Dysphagia, unspecified: Secondary | ICD-10-CM | POA: Diagnosis not present

## 2023-08-16 DIAGNOSIS — E538 Deficiency of other specified B group vitamins: Secondary | ICD-10-CM | POA: Diagnosis not present

## 2023-08-16 DIAGNOSIS — G20A1 Parkinson's disease without dyskinesia, without mention of fluctuations: Secondary | ICD-10-CM | POA: Diagnosis not present

## 2023-08-16 DIAGNOSIS — I10 Essential (primary) hypertension: Secondary | ICD-10-CM | POA: Diagnosis not present

## 2023-08-16 DIAGNOSIS — I2510 Atherosclerotic heart disease of native coronary artery without angina pectoris: Secondary | ICD-10-CM | POA: Diagnosis not present

## 2023-08-16 DIAGNOSIS — E559 Vitamin D deficiency, unspecified: Secondary | ICD-10-CM | POA: Diagnosis not present

## 2023-08-16 DIAGNOSIS — H409 Unspecified glaucoma: Secondary | ICD-10-CM | POA: Diagnosis not present

## 2023-08-16 DIAGNOSIS — I4891 Unspecified atrial fibrillation: Secondary | ICD-10-CM | POA: Diagnosis not present

## 2023-08-16 DIAGNOSIS — S72002D Fracture of unspecified part of neck of left femur, subsequent encounter for closed fracture with routine healing: Secondary | ICD-10-CM | POA: Diagnosis not present

## 2023-08-20 DIAGNOSIS — E559 Vitamin D deficiency, unspecified: Secondary | ICD-10-CM | POA: Diagnosis not present

## 2023-08-20 DIAGNOSIS — G20A1 Parkinson's disease without dyskinesia, without mention of fluctuations: Secondary | ICD-10-CM | POA: Diagnosis not present

## 2023-08-20 DIAGNOSIS — S72002D Fracture of unspecified part of neck of left femur, subsequent encounter for closed fracture with routine healing: Secondary | ICD-10-CM | POA: Diagnosis not present

## 2023-08-20 DIAGNOSIS — I4891 Unspecified atrial fibrillation: Secondary | ICD-10-CM | POA: Diagnosis not present

## 2023-08-20 DIAGNOSIS — I10 Essential (primary) hypertension: Secondary | ICD-10-CM | POA: Diagnosis not present

## 2023-08-20 DIAGNOSIS — H409 Unspecified glaucoma: Secondary | ICD-10-CM | POA: Diagnosis not present

## 2023-08-20 DIAGNOSIS — I2510 Atherosclerotic heart disease of native coronary artery without angina pectoris: Secondary | ICD-10-CM | POA: Diagnosis not present

## 2023-08-20 DIAGNOSIS — E538 Deficiency of other specified B group vitamins: Secondary | ICD-10-CM | POA: Diagnosis not present

## 2023-08-20 DIAGNOSIS — R1310 Dysphagia, unspecified: Secondary | ICD-10-CM | POA: Diagnosis not present

## 2023-08-23 DIAGNOSIS — E559 Vitamin D deficiency, unspecified: Secondary | ICD-10-CM | POA: Diagnosis not present

## 2023-08-23 DIAGNOSIS — I4891 Unspecified atrial fibrillation: Secondary | ICD-10-CM | POA: Diagnosis not present

## 2023-08-23 DIAGNOSIS — E538 Deficiency of other specified B group vitamins: Secondary | ICD-10-CM | POA: Diagnosis not present

## 2023-08-23 DIAGNOSIS — I10 Essential (primary) hypertension: Secondary | ICD-10-CM | POA: Diagnosis not present

## 2023-08-23 DIAGNOSIS — I2510 Atherosclerotic heart disease of native coronary artery without angina pectoris: Secondary | ICD-10-CM | POA: Diagnosis not present

## 2023-08-23 DIAGNOSIS — G20A1 Parkinson's disease without dyskinesia, without mention of fluctuations: Secondary | ICD-10-CM | POA: Diagnosis not present

## 2023-08-23 DIAGNOSIS — S72002D Fracture of unspecified part of neck of left femur, subsequent encounter for closed fracture with routine healing: Secondary | ICD-10-CM | POA: Diagnosis not present

## 2023-08-24 DIAGNOSIS — H409 Unspecified glaucoma: Secondary | ICD-10-CM | POA: Diagnosis not present

## 2023-08-24 DIAGNOSIS — I10 Essential (primary) hypertension: Secondary | ICD-10-CM | POA: Diagnosis not present

## 2023-08-24 DIAGNOSIS — R1310 Dysphagia, unspecified: Secondary | ICD-10-CM | POA: Diagnosis not present

## 2023-08-24 DIAGNOSIS — G20A1 Parkinson's disease without dyskinesia, without mention of fluctuations: Secondary | ICD-10-CM | POA: Diagnosis not present

## 2023-08-24 DIAGNOSIS — E559 Vitamin D deficiency, unspecified: Secondary | ICD-10-CM | POA: Diagnosis not present

## 2023-08-24 DIAGNOSIS — I4891 Unspecified atrial fibrillation: Secondary | ICD-10-CM | POA: Diagnosis not present

## 2023-08-24 DIAGNOSIS — S72002D Fracture of unspecified part of neck of left femur, subsequent encounter for closed fracture with routine healing: Secondary | ICD-10-CM | POA: Diagnosis not present

## 2023-08-24 DIAGNOSIS — I2510 Atherosclerotic heart disease of native coronary artery without angina pectoris: Secondary | ICD-10-CM | POA: Diagnosis not present

## 2023-08-24 DIAGNOSIS — E538 Deficiency of other specified B group vitamins: Secondary | ICD-10-CM | POA: Diagnosis not present

## 2023-08-27 DIAGNOSIS — E538 Deficiency of other specified B group vitamins: Secondary | ICD-10-CM | POA: Diagnosis not present

## 2023-08-27 DIAGNOSIS — G20A1 Parkinson's disease without dyskinesia, without mention of fluctuations: Secondary | ICD-10-CM | POA: Diagnosis not present

## 2023-08-27 DIAGNOSIS — I4891 Unspecified atrial fibrillation: Secondary | ICD-10-CM | POA: Diagnosis not present

## 2023-08-27 DIAGNOSIS — R1310 Dysphagia, unspecified: Secondary | ICD-10-CM | POA: Diagnosis not present

## 2023-08-27 DIAGNOSIS — S72002D Fracture of unspecified part of neck of left femur, subsequent encounter for closed fracture with routine healing: Secondary | ICD-10-CM | POA: Diagnosis not present

## 2023-08-27 DIAGNOSIS — I2510 Atherosclerotic heart disease of native coronary artery without angina pectoris: Secondary | ICD-10-CM | POA: Diagnosis not present

## 2023-08-27 DIAGNOSIS — I10 Essential (primary) hypertension: Secondary | ICD-10-CM | POA: Diagnosis not present

## 2023-08-27 DIAGNOSIS — E559 Vitamin D deficiency, unspecified: Secondary | ICD-10-CM | POA: Diagnosis not present

## 2023-08-27 DIAGNOSIS — H409 Unspecified glaucoma: Secondary | ICD-10-CM | POA: Diagnosis not present

## 2023-08-28 DIAGNOSIS — I2510 Atherosclerotic heart disease of native coronary artery without angina pectoris: Secondary | ICD-10-CM | POA: Diagnosis not present

## 2023-08-28 DIAGNOSIS — G20A1 Parkinson's disease without dyskinesia, without mention of fluctuations: Secondary | ICD-10-CM | POA: Diagnosis not present

## 2023-08-28 DIAGNOSIS — E559 Vitamin D deficiency, unspecified: Secondary | ICD-10-CM | POA: Diagnosis not present

## 2023-08-28 DIAGNOSIS — I10 Essential (primary) hypertension: Secondary | ICD-10-CM | POA: Diagnosis not present

## 2023-08-28 DIAGNOSIS — E538 Deficiency of other specified B group vitamins: Secondary | ICD-10-CM | POA: Diagnosis not present

## 2023-08-28 DIAGNOSIS — I4891 Unspecified atrial fibrillation: Secondary | ICD-10-CM | POA: Diagnosis not present

## 2023-08-28 DIAGNOSIS — S72002D Fracture of unspecified part of neck of left femur, subsequent encounter for closed fracture with routine healing: Secondary | ICD-10-CM | POA: Diagnosis not present

## 2023-08-28 DIAGNOSIS — R1310 Dysphagia, unspecified: Secondary | ICD-10-CM | POA: Diagnosis not present

## 2023-08-28 DIAGNOSIS — H409 Unspecified glaucoma: Secondary | ICD-10-CM | POA: Diagnosis not present

## 2023-08-30 DIAGNOSIS — H409 Unspecified glaucoma: Secondary | ICD-10-CM | POA: Diagnosis not present

## 2023-08-30 DIAGNOSIS — I2510 Atherosclerotic heart disease of native coronary artery without angina pectoris: Secondary | ICD-10-CM | POA: Diagnosis not present

## 2023-08-30 DIAGNOSIS — E538 Deficiency of other specified B group vitamins: Secondary | ICD-10-CM | POA: Diagnosis not present

## 2023-08-30 DIAGNOSIS — S72002D Fracture of unspecified part of neck of left femur, subsequent encounter for closed fracture with routine healing: Secondary | ICD-10-CM | POA: Diagnosis not present

## 2023-08-30 DIAGNOSIS — E559 Vitamin D deficiency, unspecified: Secondary | ICD-10-CM | POA: Diagnosis not present

## 2023-08-30 DIAGNOSIS — I4891 Unspecified atrial fibrillation: Secondary | ICD-10-CM | POA: Diagnosis not present

## 2023-08-30 DIAGNOSIS — G20A1 Parkinson's disease without dyskinesia, without mention of fluctuations: Secondary | ICD-10-CM | POA: Diagnosis not present

## 2023-08-30 DIAGNOSIS — R1310 Dysphagia, unspecified: Secondary | ICD-10-CM | POA: Diagnosis not present

## 2023-08-30 DIAGNOSIS — I10 Essential (primary) hypertension: Secondary | ICD-10-CM | POA: Diagnosis not present

## 2023-09-02 DIAGNOSIS — G20A1 Parkinson's disease without dyskinesia, without mention of fluctuations: Secondary | ICD-10-CM | POA: Diagnosis not present

## 2023-09-02 DIAGNOSIS — H409 Unspecified glaucoma: Secondary | ICD-10-CM | POA: Diagnosis not present

## 2023-09-02 DIAGNOSIS — E559 Vitamin D deficiency, unspecified: Secondary | ICD-10-CM | POA: Diagnosis not present

## 2023-09-02 DIAGNOSIS — R1310 Dysphagia, unspecified: Secondary | ICD-10-CM | POA: Diagnosis not present

## 2023-09-02 DIAGNOSIS — S72002D Fracture of unspecified part of neck of left femur, subsequent encounter for closed fracture with routine healing: Secondary | ICD-10-CM | POA: Diagnosis not present

## 2023-09-02 DIAGNOSIS — I10 Essential (primary) hypertension: Secondary | ICD-10-CM | POA: Diagnosis not present

## 2023-09-02 DIAGNOSIS — I4891 Unspecified atrial fibrillation: Secondary | ICD-10-CM | POA: Diagnosis not present

## 2023-09-02 DIAGNOSIS — E538 Deficiency of other specified B group vitamins: Secondary | ICD-10-CM | POA: Diagnosis not present

## 2023-09-02 DIAGNOSIS — I2510 Atherosclerotic heart disease of native coronary artery without angina pectoris: Secondary | ICD-10-CM | POA: Diagnosis not present

## 2023-09-04 DIAGNOSIS — S72002D Fracture of unspecified part of neck of left femur, subsequent encounter for closed fracture with routine healing: Secondary | ICD-10-CM | POA: Diagnosis not present

## 2023-09-04 DIAGNOSIS — H409 Unspecified glaucoma: Secondary | ICD-10-CM | POA: Diagnosis not present

## 2023-09-04 DIAGNOSIS — G20A1 Parkinson's disease without dyskinesia, without mention of fluctuations: Secondary | ICD-10-CM | POA: Diagnosis not present

## 2023-09-04 DIAGNOSIS — I2510 Atherosclerotic heart disease of native coronary artery without angina pectoris: Secondary | ICD-10-CM | POA: Diagnosis not present

## 2023-09-04 DIAGNOSIS — I4891 Unspecified atrial fibrillation: Secondary | ICD-10-CM | POA: Diagnosis not present

## 2023-09-04 DIAGNOSIS — I10 Essential (primary) hypertension: Secondary | ICD-10-CM | POA: Diagnosis not present

## 2023-09-04 DIAGNOSIS — E559 Vitamin D deficiency, unspecified: Secondary | ICD-10-CM | POA: Diagnosis not present

## 2023-09-04 DIAGNOSIS — R1310 Dysphagia, unspecified: Secondary | ICD-10-CM | POA: Diagnosis not present

## 2023-09-04 DIAGNOSIS — E538 Deficiency of other specified B group vitamins: Secondary | ICD-10-CM | POA: Diagnosis not present

## 2023-09-05 DIAGNOSIS — H409 Unspecified glaucoma: Secondary | ICD-10-CM | POA: Diagnosis not present

## 2023-09-05 DIAGNOSIS — I4891 Unspecified atrial fibrillation: Secondary | ICD-10-CM | POA: Diagnosis not present

## 2023-09-05 DIAGNOSIS — S72002D Fracture of unspecified part of neck of left femur, subsequent encounter for closed fracture with routine healing: Secondary | ICD-10-CM | POA: Diagnosis not present

## 2023-09-05 DIAGNOSIS — E538 Deficiency of other specified B group vitamins: Secondary | ICD-10-CM | POA: Diagnosis not present

## 2023-09-05 DIAGNOSIS — I10 Essential (primary) hypertension: Secondary | ICD-10-CM | POA: Diagnosis not present

## 2023-09-05 DIAGNOSIS — I2510 Atherosclerotic heart disease of native coronary artery without angina pectoris: Secondary | ICD-10-CM | POA: Diagnosis not present

## 2023-09-05 DIAGNOSIS — G20A1 Parkinson's disease without dyskinesia, without mention of fluctuations: Secondary | ICD-10-CM | POA: Diagnosis not present

## 2023-09-05 DIAGNOSIS — E559 Vitamin D deficiency, unspecified: Secondary | ICD-10-CM | POA: Diagnosis not present

## 2023-09-05 DIAGNOSIS — R1310 Dysphagia, unspecified: Secondary | ICD-10-CM | POA: Diagnosis not present

## 2023-09-06 DIAGNOSIS — E559 Vitamin D deficiency, unspecified: Secondary | ICD-10-CM | POA: Diagnosis not present

## 2023-09-06 DIAGNOSIS — S72002D Fracture of unspecified part of neck of left femur, subsequent encounter for closed fracture with routine healing: Secondary | ICD-10-CM | POA: Diagnosis not present

## 2023-09-06 DIAGNOSIS — G20A1 Parkinson's disease without dyskinesia, without mention of fluctuations: Secondary | ICD-10-CM | POA: Diagnosis not present

## 2023-09-06 DIAGNOSIS — I2510 Atherosclerotic heart disease of native coronary artery without angina pectoris: Secondary | ICD-10-CM | POA: Diagnosis not present

## 2023-09-06 DIAGNOSIS — R1310 Dysphagia, unspecified: Secondary | ICD-10-CM | POA: Diagnosis not present

## 2023-09-06 DIAGNOSIS — H409 Unspecified glaucoma: Secondary | ICD-10-CM | POA: Diagnosis not present

## 2023-09-06 DIAGNOSIS — I4891 Unspecified atrial fibrillation: Secondary | ICD-10-CM | POA: Diagnosis not present

## 2023-09-06 DIAGNOSIS — E538 Deficiency of other specified B group vitamins: Secondary | ICD-10-CM | POA: Diagnosis not present

## 2023-09-06 DIAGNOSIS — I10 Essential (primary) hypertension: Secondary | ICD-10-CM | POA: Diagnosis not present

## 2023-09-10 DIAGNOSIS — E538 Deficiency of other specified B group vitamins: Secondary | ICD-10-CM | POA: Diagnosis not present

## 2023-09-10 DIAGNOSIS — S72002D Fracture of unspecified part of neck of left femur, subsequent encounter for closed fracture with routine healing: Secondary | ICD-10-CM | POA: Diagnosis not present

## 2023-09-10 DIAGNOSIS — E559 Vitamin D deficiency, unspecified: Secondary | ICD-10-CM | POA: Diagnosis not present

## 2023-09-10 DIAGNOSIS — H409 Unspecified glaucoma: Secondary | ICD-10-CM | POA: Diagnosis not present

## 2023-09-10 DIAGNOSIS — I2510 Atherosclerotic heart disease of native coronary artery without angina pectoris: Secondary | ICD-10-CM | POA: Diagnosis not present

## 2023-09-10 DIAGNOSIS — I10 Essential (primary) hypertension: Secondary | ICD-10-CM | POA: Diagnosis not present

## 2023-09-10 DIAGNOSIS — I4891 Unspecified atrial fibrillation: Secondary | ICD-10-CM | POA: Diagnosis not present

## 2023-09-10 DIAGNOSIS — G20A1 Parkinson's disease without dyskinesia, without mention of fluctuations: Secondary | ICD-10-CM | POA: Diagnosis not present

## 2023-09-10 DIAGNOSIS — R1310 Dysphagia, unspecified: Secondary | ICD-10-CM | POA: Diagnosis not present

## 2023-09-12 DIAGNOSIS — E559 Vitamin D deficiency, unspecified: Secondary | ICD-10-CM | POA: Diagnosis not present

## 2023-09-12 DIAGNOSIS — R1310 Dysphagia, unspecified: Secondary | ICD-10-CM | POA: Diagnosis not present

## 2023-09-12 DIAGNOSIS — I10 Essential (primary) hypertension: Secondary | ICD-10-CM | POA: Diagnosis not present

## 2023-09-12 DIAGNOSIS — S72002D Fracture of unspecified part of neck of left femur, subsequent encounter for closed fracture with routine healing: Secondary | ICD-10-CM | POA: Diagnosis not present

## 2023-09-12 DIAGNOSIS — G20A1 Parkinson's disease without dyskinesia, without mention of fluctuations: Secondary | ICD-10-CM | POA: Diagnosis not present

## 2023-09-12 DIAGNOSIS — E538 Deficiency of other specified B group vitamins: Secondary | ICD-10-CM | POA: Diagnosis not present

## 2023-09-12 DIAGNOSIS — I4891 Unspecified atrial fibrillation: Secondary | ICD-10-CM | POA: Diagnosis not present

## 2023-09-12 DIAGNOSIS — I2510 Atherosclerotic heart disease of native coronary artery without angina pectoris: Secondary | ICD-10-CM | POA: Diagnosis not present

## 2023-09-12 DIAGNOSIS — H409 Unspecified glaucoma: Secondary | ICD-10-CM | POA: Diagnosis not present

## 2023-09-14 DIAGNOSIS — E538 Deficiency of other specified B group vitamins: Secondary | ICD-10-CM | POA: Diagnosis not present

## 2023-09-14 DIAGNOSIS — R1310 Dysphagia, unspecified: Secondary | ICD-10-CM | POA: Diagnosis not present

## 2023-09-14 DIAGNOSIS — I4891 Unspecified atrial fibrillation: Secondary | ICD-10-CM | POA: Diagnosis not present

## 2023-09-14 DIAGNOSIS — G20A1 Parkinson's disease without dyskinesia, without mention of fluctuations: Secondary | ICD-10-CM | POA: Diagnosis not present

## 2023-09-14 DIAGNOSIS — S72002D Fracture of unspecified part of neck of left femur, subsequent encounter for closed fracture with routine healing: Secondary | ICD-10-CM | POA: Diagnosis not present

## 2023-09-14 DIAGNOSIS — I10 Essential (primary) hypertension: Secondary | ICD-10-CM | POA: Diagnosis not present

## 2023-09-14 DIAGNOSIS — E559 Vitamin D deficiency, unspecified: Secondary | ICD-10-CM | POA: Diagnosis not present

## 2023-09-14 DIAGNOSIS — I2510 Atherosclerotic heart disease of native coronary artery without angina pectoris: Secondary | ICD-10-CM | POA: Diagnosis not present

## 2023-09-14 DIAGNOSIS — H409 Unspecified glaucoma: Secondary | ICD-10-CM | POA: Diagnosis not present

## 2023-09-16 DIAGNOSIS — L97521 Non-pressure chronic ulcer of other part of left foot limited to breakdown of skin: Secondary | ICD-10-CM | POA: Diagnosis not present

## 2023-09-16 DIAGNOSIS — M79675 Pain in left toe(s): Secondary | ICD-10-CM | POA: Diagnosis not present

## 2023-09-16 DIAGNOSIS — M2042 Other hammer toe(s) (acquired), left foot: Secondary | ICD-10-CM | POA: Diagnosis not present

## 2023-09-16 DIAGNOSIS — S90415A Abrasion, left lesser toe(s), initial encounter: Secondary | ICD-10-CM | POA: Diagnosis not present

## 2023-09-16 DIAGNOSIS — B351 Tinea unguium: Secondary | ICD-10-CM | POA: Diagnosis not present

## 2023-09-16 DIAGNOSIS — M79674 Pain in right toe(s): Secondary | ICD-10-CM | POA: Diagnosis not present

## 2023-09-17 DIAGNOSIS — R1310 Dysphagia, unspecified: Secondary | ICD-10-CM | POA: Diagnosis not present

## 2023-09-17 DIAGNOSIS — H409 Unspecified glaucoma: Secondary | ICD-10-CM | POA: Diagnosis not present

## 2023-09-17 DIAGNOSIS — E538 Deficiency of other specified B group vitamins: Secondary | ICD-10-CM | POA: Diagnosis not present

## 2023-09-17 DIAGNOSIS — S72002D Fracture of unspecified part of neck of left femur, subsequent encounter for closed fracture with routine healing: Secondary | ICD-10-CM | POA: Diagnosis not present

## 2023-09-17 DIAGNOSIS — E559 Vitamin D deficiency, unspecified: Secondary | ICD-10-CM | POA: Diagnosis not present

## 2023-09-17 DIAGNOSIS — I10 Essential (primary) hypertension: Secondary | ICD-10-CM | POA: Diagnosis not present

## 2023-09-17 DIAGNOSIS — I4891 Unspecified atrial fibrillation: Secondary | ICD-10-CM | POA: Diagnosis not present

## 2023-09-17 DIAGNOSIS — G20A1 Parkinson's disease without dyskinesia, without mention of fluctuations: Secondary | ICD-10-CM | POA: Diagnosis not present

## 2023-09-17 DIAGNOSIS — I2510 Atherosclerotic heart disease of native coronary artery without angina pectoris: Secondary | ICD-10-CM | POA: Diagnosis not present

## 2023-09-18 DIAGNOSIS — I480 Paroxysmal atrial fibrillation: Secondary | ICD-10-CM | POA: Diagnosis not present

## 2023-09-18 DIAGNOSIS — N1831 Chronic kidney disease, stage 3a: Secondary | ICD-10-CM | POA: Diagnosis not present

## 2023-09-18 DIAGNOSIS — R634 Abnormal weight loss: Secondary | ICD-10-CM | POA: Diagnosis not present

## 2023-09-18 DIAGNOSIS — D631 Anemia in chronic kidney disease: Secondary | ICD-10-CM | POA: Diagnosis not present

## 2023-09-18 DIAGNOSIS — G20C Parkinsonism, unspecified: Secondary | ICD-10-CM | POA: Diagnosis not present

## 2023-09-20 DIAGNOSIS — G20A1 Parkinson's disease without dyskinesia, without mention of fluctuations: Secondary | ICD-10-CM | POA: Diagnosis not present

## 2023-09-20 DIAGNOSIS — I10 Essential (primary) hypertension: Secondary | ICD-10-CM | POA: Diagnosis not present

## 2023-09-20 DIAGNOSIS — E559 Vitamin D deficiency, unspecified: Secondary | ICD-10-CM | POA: Diagnosis not present

## 2023-09-20 DIAGNOSIS — H409 Unspecified glaucoma: Secondary | ICD-10-CM | POA: Diagnosis not present

## 2023-09-20 DIAGNOSIS — I4891 Unspecified atrial fibrillation: Secondary | ICD-10-CM | POA: Diagnosis not present

## 2023-09-20 DIAGNOSIS — I2510 Atherosclerotic heart disease of native coronary artery without angina pectoris: Secondary | ICD-10-CM | POA: Diagnosis not present

## 2023-09-20 DIAGNOSIS — E538 Deficiency of other specified B group vitamins: Secondary | ICD-10-CM | POA: Diagnosis not present

## 2023-09-20 DIAGNOSIS — S72002D Fracture of unspecified part of neck of left femur, subsequent encounter for closed fracture with routine healing: Secondary | ICD-10-CM | POA: Diagnosis not present

## 2023-09-20 DIAGNOSIS — R1310 Dysphagia, unspecified: Secondary | ICD-10-CM | POA: Diagnosis not present

## 2023-09-24 DIAGNOSIS — E559 Vitamin D deficiency, unspecified: Secondary | ICD-10-CM | POA: Diagnosis not present

## 2023-09-24 DIAGNOSIS — I10 Essential (primary) hypertension: Secondary | ICD-10-CM | POA: Diagnosis not present

## 2023-09-24 DIAGNOSIS — G20A1 Parkinson's disease without dyskinesia, without mention of fluctuations: Secondary | ICD-10-CM | POA: Diagnosis not present

## 2023-09-24 DIAGNOSIS — R1310 Dysphagia, unspecified: Secondary | ICD-10-CM | POA: Diagnosis not present

## 2023-09-24 DIAGNOSIS — H409 Unspecified glaucoma: Secondary | ICD-10-CM | POA: Diagnosis not present

## 2023-09-24 DIAGNOSIS — S72002D Fracture of unspecified part of neck of left femur, subsequent encounter for closed fracture with routine healing: Secondary | ICD-10-CM | POA: Diagnosis not present

## 2023-09-24 DIAGNOSIS — E538 Deficiency of other specified B group vitamins: Secondary | ICD-10-CM | POA: Diagnosis not present

## 2023-09-24 DIAGNOSIS — I2510 Atherosclerotic heart disease of native coronary artery without angina pectoris: Secondary | ICD-10-CM | POA: Diagnosis not present

## 2023-09-24 DIAGNOSIS — I4891 Unspecified atrial fibrillation: Secondary | ICD-10-CM | POA: Diagnosis not present

## 2023-09-28 DIAGNOSIS — S72002D Fracture of unspecified part of neck of left femur, subsequent encounter for closed fracture with routine healing: Secondary | ICD-10-CM | POA: Diagnosis not present

## 2023-09-28 DIAGNOSIS — G20A1 Parkinson's disease without dyskinesia, without mention of fluctuations: Secondary | ICD-10-CM | POA: Diagnosis not present

## 2023-09-28 DIAGNOSIS — R1310 Dysphagia, unspecified: Secondary | ICD-10-CM | POA: Diagnosis not present

## 2023-09-28 DIAGNOSIS — I2510 Atherosclerotic heart disease of native coronary artery without angina pectoris: Secondary | ICD-10-CM | POA: Diagnosis not present

## 2023-09-28 DIAGNOSIS — E538 Deficiency of other specified B group vitamins: Secondary | ICD-10-CM | POA: Diagnosis not present

## 2023-09-28 DIAGNOSIS — I4891 Unspecified atrial fibrillation: Secondary | ICD-10-CM | POA: Diagnosis not present

## 2023-09-28 DIAGNOSIS — H409 Unspecified glaucoma: Secondary | ICD-10-CM | POA: Diagnosis not present

## 2023-09-28 DIAGNOSIS — I10 Essential (primary) hypertension: Secondary | ICD-10-CM | POA: Diagnosis not present

## 2023-09-28 DIAGNOSIS — E559 Vitamin D deficiency, unspecified: Secondary | ICD-10-CM | POA: Diagnosis not present

## 2023-09-30 DIAGNOSIS — S72002D Fracture of unspecified part of neck of left femur, subsequent encounter for closed fracture with routine healing: Secondary | ICD-10-CM | POA: Diagnosis not present

## 2023-09-30 DIAGNOSIS — G20A1 Parkinson's disease without dyskinesia, without mention of fluctuations: Secondary | ICD-10-CM | POA: Diagnosis not present

## 2023-09-30 DIAGNOSIS — I10 Essential (primary) hypertension: Secondary | ICD-10-CM | POA: Diagnosis not present

## 2023-09-30 DIAGNOSIS — I4891 Unspecified atrial fibrillation: Secondary | ICD-10-CM | POA: Diagnosis not present

## 2023-09-30 DIAGNOSIS — H409 Unspecified glaucoma: Secondary | ICD-10-CM | POA: Diagnosis not present

## 2023-09-30 DIAGNOSIS — E559 Vitamin D deficiency, unspecified: Secondary | ICD-10-CM | POA: Diagnosis not present

## 2023-09-30 DIAGNOSIS — I2510 Atherosclerotic heart disease of native coronary artery without angina pectoris: Secondary | ICD-10-CM | POA: Diagnosis not present

## 2023-09-30 DIAGNOSIS — E538 Deficiency of other specified B group vitamins: Secondary | ICD-10-CM | POA: Diagnosis not present

## 2023-09-30 DIAGNOSIS — R1310 Dysphagia, unspecified: Secondary | ICD-10-CM | POA: Diagnosis not present

## 2023-10-01 DIAGNOSIS — H409 Unspecified glaucoma: Secondary | ICD-10-CM | POA: Diagnosis not present

## 2023-10-01 DIAGNOSIS — S72002D Fracture of unspecified part of neck of left femur, subsequent encounter for closed fracture with routine healing: Secondary | ICD-10-CM | POA: Diagnosis not present

## 2023-10-01 DIAGNOSIS — I10 Essential (primary) hypertension: Secondary | ICD-10-CM | POA: Diagnosis not present

## 2023-10-01 DIAGNOSIS — I4891 Unspecified atrial fibrillation: Secondary | ICD-10-CM | POA: Diagnosis not present

## 2023-10-01 DIAGNOSIS — G20A1 Parkinson's disease without dyskinesia, without mention of fluctuations: Secondary | ICD-10-CM | POA: Diagnosis not present

## 2023-10-01 DIAGNOSIS — I2510 Atherosclerotic heart disease of native coronary artery without angina pectoris: Secondary | ICD-10-CM | POA: Diagnosis not present

## 2023-10-01 DIAGNOSIS — E559 Vitamin D deficiency, unspecified: Secondary | ICD-10-CM | POA: Diagnosis not present

## 2023-10-01 DIAGNOSIS — E538 Deficiency of other specified B group vitamins: Secondary | ICD-10-CM | POA: Diagnosis not present

## 2023-10-01 DIAGNOSIS — R1310 Dysphagia, unspecified: Secondary | ICD-10-CM | POA: Diagnosis not present

## 2023-10-03 DIAGNOSIS — G20A1 Parkinson's disease without dyskinesia, without mention of fluctuations: Secondary | ICD-10-CM | POA: Diagnosis not present

## 2023-10-03 DIAGNOSIS — I2510 Atherosclerotic heart disease of native coronary artery without angina pectoris: Secondary | ICD-10-CM | POA: Diagnosis not present

## 2023-10-03 DIAGNOSIS — E559 Vitamin D deficiency, unspecified: Secondary | ICD-10-CM | POA: Diagnosis not present

## 2023-10-03 DIAGNOSIS — H409 Unspecified glaucoma: Secondary | ICD-10-CM | POA: Diagnosis not present

## 2023-10-03 DIAGNOSIS — E538 Deficiency of other specified B group vitamins: Secondary | ICD-10-CM | POA: Diagnosis not present

## 2023-10-03 DIAGNOSIS — R1310 Dysphagia, unspecified: Secondary | ICD-10-CM | POA: Diagnosis not present

## 2023-10-03 DIAGNOSIS — I10 Essential (primary) hypertension: Secondary | ICD-10-CM | POA: Diagnosis not present

## 2023-10-03 DIAGNOSIS — S72002D Fracture of unspecified part of neck of left femur, subsequent encounter for closed fracture with routine healing: Secondary | ICD-10-CM | POA: Diagnosis not present

## 2023-10-03 DIAGNOSIS — I4891 Unspecified atrial fibrillation: Secondary | ICD-10-CM | POA: Diagnosis not present

## 2023-10-04 DIAGNOSIS — I4891 Unspecified atrial fibrillation: Secondary | ICD-10-CM | POA: Diagnosis not present

## 2023-10-04 DIAGNOSIS — I10 Essential (primary) hypertension: Secondary | ICD-10-CM | POA: Diagnosis not present

## 2023-10-04 DIAGNOSIS — E538 Deficiency of other specified B group vitamins: Secondary | ICD-10-CM | POA: Diagnosis not present

## 2023-10-04 DIAGNOSIS — S72002D Fracture of unspecified part of neck of left femur, subsequent encounter for closed fracture with routine healing: Secondary | ICD-10-CM | POA: Diagnosis not present

## 2023-10-04 DIAGNOSIS — G20A1 Parkinson's disease without dyskinesia, without mention of fluctuations: Secondary | ICD-10-CM | POA: Diagnosis not present

## 2023-10-04 DIAGNOSIS — E559 Vitamin D deficiency, unspecified: Secondary | ICD-10-CM | POA: Diagnosis not present

## 2023-10-04 DIAGNOSIS — R1310 Dysphagia, unspecified: Secondary | ICD-10-CM | POA: Diagnosis not present

## 2023-10-04 DIAGNOSIS — I2510 Atherosclerotic heart disease of native coronary artery without angina pectoris: Secondary | ICD-10-CM | POA: Diagnosis not present

## 2023-10-04 DIAGNOSIS — H409 Unspecified glaucoma: Secondary | ICD-10-CM | POA: Diagnosis not present

## 2023-10-08 DIAGNOSIS — E538 Deficiency of other specified B group vitamins: Secondary | ICD-10-CM | POA: Diagnosis not present

## 2023-10-08 DIAGNOSIS — L89322 Pressure ulcer of left buttock, stage 2: Secondary | ICD-10-CM | POA: Diagnosis not present

## 2023-10-08 DIAGNOSIS — R1310 Dysphagia, unspecified: Secondary | ICD-10-CM | POA: Diagnosis not present

## 2023-10-08 DIAGNOSIS — I4891 Unspecified atrial fibrillation: Secondary | ICD-10-CM | POA: Diagnosis not present

## 2023-10-08 DIAGNOSIS — E559 Vitamin D deficiency, unspecified: Secondary | ICD-10-CM | POA: Diagnosis not present

## 2023-10-08 DIAGNOSIS — S72002D Fracture of unspecified part of neck of left femur, subsequent encounter for closed fracture with routine healing: Secondary | ICD-10-CM | POA: Diagnosis not present

## 2023-10-08 DIAGNOSIS — I10 Essential (primary) hypertension: Secondary | ICD-10-CM | POA: Diagnosis not present

## 2023-10-08 DIAGNOSIS — G20A1 Parkinson's disease without dyskinesia, without mention of fluctuations: Secondary | ICD-10-CM | POA: Diagnosis not present

## 2023-10-08 DIAGNOSIS — I2510 Atherosclerotic heart disease of native coronary artery without angina pectoris: Secondary | ICD-10-CM | POA: Diagnosis not present

## 2023-10-10 DIAGNOSIS — I4891 Unspecified atrial fibrillation: Secondary | ICD-10-CM | POA: Diagnosis not present

## 2023-10-10 DIAGNOSIS — E559 Vitamin D deficiency, unspecified: Secondary | ICD-10-CM | POA: Diagnosis not present

## 2023-10-10 DIAGNOSIS — L89322 Pressure ulcer of left buttock, stage 2: Secondary | ICD-10-CM | POA: Diagnosis not present

## 2023-10-10 DIAGNOSIS — E538 Deficiency of other specified B group vitamins: Secondary | ICD-10-CM | POA: Diagnosis not present

## 2023-10-10 DIAGNOSIS — G20A1 Parkinson's disease without dyskinesia, without mention of fluctuations: Secondary | ICD-10-CM | POA: Diagnosis not present

## 2023-10-10 DIAGNOSIS — I10 Essential (primary) hypertension: Secondary | ICD-10-CM | POA: Diagnosis not present

## 2023-10-10 DIAGNOSIS — S72002D Fracture of unspecified part of neck of left femur, subsequent encounter for closed fracture with routine healing: Secondary | ICD-10-CM | POA: Diagnosis not present

## 2023-10-10 DIAGNOSIS — I2510 Atherosclerotic heart disease of native coronary artery without angina pectoris: Secondary | ICD-10-CM | POA: Diagnosis not present

## 2023-10-10 DIAGNOSIS — R1310 Dysphagia, unspecified: Secondary | ICD-10-CM | POA: Diagnosis not present

## 2023-10-13 ENCOUNTER — Emergency Department

## 2023-10-13 ENCOUNTER — Inpatient Hospital Stay

## 2023-10-13 ENCOUNTER — Inpatient Hospital Stay
Admission: EM | Admit: 2023-10-13 | Discharge: 2023-10-19 | DRG: 208 | Disposition: A | Discharge status: Hospice | Attending: Obstetrics and Gynecology | Admitting: Obstetrics and Gynecology

## 2023-10-13 ENCOUNTER — Other Ambulatory Visit: Payer: Self-pay

## 2023-10-13 DIAGNOSIS — I4891 Unspecified atrial fibrillation: Secondary | ICD-10-CM | POA: Diagnosis not present

## 2023-10-13 DIAGNOSIS — R578 Other shock: Secondary | ICD-10-CM | POA: Diagnosis present

## 2023-10-13 DIAGNOSIS — T17920A Food in respiratory tract, part unspecified causing asphyxiation, initial encounter: Secondary | ICD-10-CM | POA: Diagnosis not present

## 2023-10-13 DIAGNOSIS — Z79899 Other long term (current) drug therapy: Secondary | ICD-10-CM

## 2023-10-13 DIAGNOSIS — B9620 Unspecified Escherichia coli [E. coli] as the cause of diseases classified elsewhere: Secondary | ICD-10-CM | POA: Diagnosis present

## 2023-10-13 DIAGNOSIS — T17908A Unspecified foreign body in respiratory tract, part unspecified causing other injury, initial encounter: Secondary | ICD-10-CM

## 2023-10-13 DIAGNOSIS — R5381 Other malaise: Secondary | ICD-10-CM | POA: Diagnosis present

## 2023-10-13 DIAGNOSIS — R579 Shock, unspecified: Secondary | ICD-10-CM | POA: Diagnosis not present

## 2023-10-13 DIAGNOSIS — I4821 Permanent atrial fibrillation: Secondary | ICD-10-CM | POA: Diagnosis present

## 2023-10-13 DIAGNOSIS — H9190 Unspecified hearing loss, unspecified ear: Secondary | ICD-10-CM | POA: Diagnosis present

## 2023-10-13 DIAGNOSIS — L89322 Pressure ulcer of left buttock, stage 2: Secondary | ICD-10-CM | POA: Diagnosis not present

## 2023-10-13 DIAGNOSIS — N390 Urinary tract infection, site not specified: Secondary | ICD-10-CM

## 2023-10-13 DIAGNOSIS — R000 Tachycardia, unspecified: Secondary | ICD-10-CM | POA: Diagnosis not present

## 2023-10-13 DIAGNOSIS — R4182 Altered mental status, unspecified: Secondary | ICD-10-CM | POA: Diagnosis not present

## 2023-10-13 DIAGNOSIS — Z23 Encounter for immunization: Secondary | ICD-10-CM | POA: Diagnosis not present

## 2023-10-13 DIAGNOSIS — F05 Delirium due to known physiological condition: Secondary | ICD-10-CM | POA: Diagnosis not present

## 2023-10-13 DIAGNOSIS — Z96642 Presence of left artificial hip joint: Secondary | ICD-10-CM | POA: Diagnosis present

## 2023-10-13 DIAGNOSIS — Z66 Do not resuscitate: Secondary | ICD-10-CM | POA: Diagnosis present

## 2023-10-13 DIAGNOSIS — G20A1 Parkinson's disease without dyskinesia, without mention of fluctuations: Secondary | ICD-10-CM | POA: Diagnosis present

## 2023-10-13 DIAGNOSIS — L8990 Pressure ulcer of unspecified site, unspecified stage: Secondary | ICD-10-CM | POA: Insufficient documentation

## 2023-10-13 DIAGNOSIS — I700 Atherosclerosis of aorta: Secondary | ICD-10-CM | POA: Diagnosis not present

## 2023-10-13 DIAGNOSIS — L89619 Pressure ulcer of right heel, unspecified stage: Secondary | ICD-10-CM | POA: Diagnosis present

## 2023-10-13 DIAGNOSIS — J9601 Acute respiratory failure with hypoxia: Secondary | ICD-10-CM | POA: Diagnosis not present

## 2023-10-13 DIAGNOSIS — R1312 Dysphagia, oropharyngeal phase: Secondary | ICD-10-CM | POA: Diagnosis present

## 2023-10-13 DIAGNOSIS — R8271 Bacteriuria: Secondary | ICD-10-CM | POA: Diagnosis present

## 2023-10-13 DIAGNOSIS — I495 Sick sinus syndrome: Secondary | ICD-10-CM | POA: Diagnosis present

## 2023-10-13 DIAGNOSIS — Z885 Allergy status to narcotic agent status: Secondary | ICD-10-CM

## 2023-10-13 DIAGNOSIS — W44F3XA Food entering into or through a natural orifice, initial encounter: Secondary | ICD-10-CM | POA: Diagnosis present

## 2023-10-13 DIAGNOSIS — T17528A Food in bronchus causing other injury, initial encounter: Secondary | ICD-10-CM | POA: Diagnosis not present

## 2023-10-13 DIAGNOSIS — I10 Essential (primary) hypertension: Secondary | ICD-10-CM | POA: Diagnosis present

## 2023-10-13 DIAGNOSIS — Z881 Allergy status to other antibiotic agents status: Secondary | ICD-10-CM | POA: Diagnosis not present

## 2023-10-13 DIAGNOSIS — Z4682 Encounter for fitting and adjustment of non-vascular catheter: Secondary | ICD-10-CM | POA: Diagnosis not present

## 2023-10-13 DIAGNOSIS — Z7189 Other specified counseling: Secondary | ICD-10-CM | POA: Diagnosis not present

## 2023-10-13 DIAGNOSIS — I2510 Atherosclerotic heart disease of native coronary artery without angina pectoris: Secondary | ICD-10-CM | POA: Diagnosis present

## 2023-10-13 DIAGNOSIS — H409 Unspecified glaucoma: Secondary | ICD-10-CM | POA: Diagnosis present

## 2023-10-13 DIAGNOSIS — R918 Other nonspecific abnormal finding of lung field: Secondary | ICD-10-CM | POA: Diagnosis not present

## 2023-10-13 DIAGNOSIS — Z515 Encounter for palliative care: Secondary | ICD-10-CM | POA: Diagnosis not present

## 2023-10-13 DIAGNOSIS — Z8546 Personal history of malignant neoplasm of prostate: Secondary | ICD-10-CM

## 2023-10-13 DIAGNOSIS — Z8249 Family history of ischemic heart disease and other diseases of the circulatory system: Secondary | ICD-10-CM | POA: Diagnosis not present

## 2023-10-13 DIAGNOSIS — Z950 Presence of cardiac pacemaker: Secondary | ICD-10-CM | POA: Diagnosis not present

## 2023-10-13 DIAGNOSIS — J690 Pneumonitis due to inhalation of food and vomit: Secondary | ICD-10-CM | POA: Diagnosis not present

## 2023-10-13 DIAGNOSIS — Z48813 Encounter for surgical aftercare following surgery on the respiratory system: Secondary | ICD-10-CM | POA: Diagnosis not present

## 2023-10-13 DIAGNOSIS — Z789 Other specified health status: Secondary | ICD-10-CM | POA: Diagnosis not present

## 2023-10-13 DIAGNOSIS — R1111 Vomiting without nausea: Secondary | ICD-10-CM | POA: Diagnosis not present

## 2023-10-13 LAB — PROTIME-INR
INR: 1.1 (ref 0.8–1.2)
Prothrombin Time: 14.4 s (ref 11.4–15.2)

## 2023-10-13 LAB — URINALYSIS, W/ REFLEX TO CULTURE (INFECTION SUSPECTED)
Bilirubin Urine: NEGATIVE
Glucose, UA: NEGATIVE mg/dL
Ketones, ur: 5 mg/dL — AB
Nitrite: POSITIVE — AB
Protein, ur: 30 mg/dL — AB
Specific Gravity, Urine: 1.017 (ref 1.005–1.030)
Squamous Epithelial / HPF: 0 /HPF (ref 0–5)
WBC, UA: >50 WBC/hpf (ref 0–5)
pH: 5 (ref 5.0–8.0)

## 2023-10-13 LAB — COMPREHENSIVE METABOLIC PANEL WITH GFR
ALT: 14 U/L (ref 0–44)
AST: 51 U/L — ABNORMAL HIGH (ref 15–41)
Albumin: 3.9 g/dL (ref 3.5–5.0)
Alkaline Phosphatase: 93 U/L (ref 38–126)
Anion gap: 15 (ref 5–15)
BUN: 39 mg/dL — ABNORMAL HIGH (ref 8–23)
CO2: 18 mmol/L — ABNORMAL LOW (ref 22–32)
Calcium: 9.2 mg/dL (ref 8.9–10.3)
Chloride: 104 mmol/L (ref 98–111)
Creatinine, Ser: 1.09 mg/dL (ref 0.61–1.24)
GFR, Estimated: >60 mL/min (ref >60)
Glucose, Bld: 177 mg/dL — ABNORMAL HIGH (ref 70–99)
Potassium: 3.4 mmol/L — ABNORMAL LOW (ref 3.5–5.1)
Sodium: 137 mmol/L (ref 135–145)
Total Bilirubin: 0.9 mg/dL (ref 0.0–1.2)
Total Protein: 7.5 g/dL (ref 6.5–8.1)

## 2023-10-13 LAB — TROPONIN I (HIGH SENSITIVITY): Troponin I (High Sensitivity): 8 ng/L (ref <18)

## 2023-10-13 LAB — BLOOD GAS, ARTERIAL
Acid-base deficit: 1.8 mmol/L (ref 0.0–2.0)
Bicarbonate: 23.1 mmol/L (ref 20.0–28.0)
FIO2: 35 %
MECHVT: 400 mL
Mechanical Rate: 16
O2 Saturation: 100 %
PEEP: 5 cmH2O
Patient temperature: 37
pCO2 arterial: 39 mmHg (ref 32–48)
pH, Arterial: 7.38 (ref 7.35–7.45)
pO2, Arterial: 187 mmHg — ABNORMAL HIGH (ref 83–108)

## 2023-10-13 LAB — CBC
HCT: 44 % (ref 39.0–52.0)
Hemoglobin: 14.6 g/dL (ref 13.0–17.0)
MCH: 29.2 pg (ref 26.0–34.0)
MCHC: 33.2 g/dL (ref 30.0–36.0)
MCV: 88 fL (ref 80.0–100.0)
Platelets: 279 K/uL (ref 150–400)
RBC: 5 MIL/uL (ref 4.22–5.81)
RDW: 15.4 % (ref 11.5–15.5)
WBC: 10.1 K/uL (ref 4.0–10.5)
nRBC: 0 % (ref 0.0–0.2)

## 2023-10-13 LAB — MRSA NEXT GEN BY PCR, NASAL: MRSA by PCR Next Gen: NOT DETECTED

## 2023-10-13 LAB — APTT: aPTT: 29 s (ref 24–36)

## 2023-10-13 MED ORDER — INSULIN ASPART 100 UNIT/ML IJ SOLN: 0.0000 [IU] | INTRAMUSCULAR | Status: DC

## 2023-10-13 MED ORDER — SUCCINYLCHOLINE CHLORIDE 200 MG/10ML IV SOSY
PREFILLED_SYRINGE | INTRAVENOUS | Status: AC
  Filled 2023-10-13: qty 10

## 2023-10-13 MED ORDER — DOCUSATE SODIUM 100 MG PO CAPS: 100.0000 mg | ORAL_CAPSULE | Freq: Two times a day (BID) | ORAL | Status: DC | PRN

## 2023-10-13 MED ORDER — AMIODARONE HCL IN DEXTROSE 360-4.14 MG/200ML-% IV SOLN
60.0000 mg/h | INTRAVENOUS | Status: AC
  Administered 2023-10-13: 60 mg/h via INTRAVENOUS
  Filled 2023-10-13: qty 200

## 2023-10-13 MED ORDER — ENOXAPARIN SODIUM 40 MG/0.4ML IJ SOSY: 40.0000 mg | PREFILLED_SYRINGE | INTRAMUSCULAR | Status: DC

## 2023-10-13 MED ORDER — PANTOPRAZOLE SODIUM 40 MG IV SOLR
40.0000 mg | Freq: Every day | INTRAVENOUS | Status: DC
  Administered 2023-10-13: 40 mg via INTRAVENOUS
  Filled 2023-10-13: qty 10

## 2023-10-13 MED ORDER — PROPOFOL 1000 MG/100ML IV EMUL
INTRAVENOUS | Status: AC
  Filled 2023-10-13: qty 100

## 2023-10-13 MED ORDER — ORAL CARE MOUTH RINSE
15.0000 mL | OROMUCOSAL | Status: DC
  Administered 2023-10-13 – 2023-10-14 (×13): 15 mL via OROMUCOSAL
  Filled 2023-10-13 (×5): qty 15

## 2023-10-13 MED ORDER — CHLORHEXIDINE GLUCONATE CLOTH 2 % EX PADS
6.0000 | MEDICATED_PAD | Freq: Every day | CUTANEOUS | Status: DC
  Administered 2023-10-13 – 2023-10-15 (×3): 6 via TOPICAL
  Filled 2023-10-13: qty 6

## 2023-10-13 MED ORDER — NOREPINEPHRINE 4 MG/250ML-% IV SOLN: 0.0000 ug/min | INTRAVENOUS | Status: DC

## 2023-10-13 MED ORDER — AMIODARONE HCL IN DEXTROSE 360-4.14 MG/200ML-% IV SOLN
30.0000 mg/h | INTRAVENOUS | Status: DC
  Administered 2023-10-14: 30 mg/h via INTRAVENOUS
  Filled 2023-10-13: qty 200

## 2023-10-13 MED ORDER — SUCCINYLCHOLINE CHLORIDE 200 MG/10ML IV SOSY
100.0000 mg | PREFILLED_SYRINGE | Freq: Once | INTRAVENOUS | Status: AC
  Administered 2023-10-13: 100 mg via INTRAVENOUS

## 2023-10-13 MED ORDER — ETOMIDATE 2 MG/ML IV SOLN
20.0000 mg | Freq: Once | INTRAVENOUS | Status: AC
  Administered 2023-10-13: 20 mg via INTRAVENOUS

## 2023-10-13 MED ORDER — POLYETHYLENE GLYCOL 3350 17 G PO PACK: 17.0000 g | PACK | Freq: Every day | ORAL | Status: DC | PRN

## 2023-10-13 MED ORDER — AMIODARONE IV BOLUS ONLY 150 MG/100ML
150.0000 mg | Freq: Once | INTRAVENOUS | Status: DC
  Filled 2023-10-13: qty 100

## 2023-10-13 MED ORDER — FENTANYL 2500MCG IN NS 250ML (10MCG/ML) PREMIX INFUSION
0.0000 ug/h | INTRAVENOUS | Status: DC
  Administered 2023-10-14: 25 ug/h via INTRAVENOUS
  Filled 2023-10-13: qty 250

## 2023-10-13 MED ORDER — POTASSIUM CHLORIDE 10 MEQ/100ML IV SOLN
10.0000 meq | INTRAVENOUS | Status: AC
  Administered 2023-10-13 – 2023-10-14 (×4): 10 meq via INTRAVENOUS
  Filled 2023-10-13 (×4): qty 100

## 2023-10-13 MED ORDER — LORAZEPAM 2 MG/ML IJ SOLN
1.0000 mg | Freq: Once | INTRAMUSCULAR | Status: AC
  Administered 2023-10-13: 1 mg via INTRAVENOUS
  Filled 2023-10-13: qty 1

## 2023-10-13 MED ORDER — FENTANYL CITRATE PF 50 MCG/ML IJ SOSY
200.0000 ug | PREFILLED_SYRINGE | Freq: Once | INTRAMUSCULAR | Status: AC
  Administered 2023-10-13: 100 ug via INTRAVENOUS
  Filled 2023-10-13: qty 4

## 2023-10-13 MED ORDER — NOREPINEPHRINE 4 MG/250ML-% IV SOLN
INTRAVENOUS | Status: AC
  Administered 2023-10-13: 2 ug/min via INTRAVENOUS
  Filled 2023-10-13: qty 250

## 2023-10-13 MED ORDER — HEPARIN BOLUS VIA INFUSION
3700.0000 [IU] | Freq: Once | INTRAVENOUS | Status: AC
Start: 2023-10-13 — End: 2023-10-13
  Administered 2023-10-13: 3700 [IU] via INTRAVENOUS
  Filled 2023-10-13: qty 3700

## 2023-10-13 MED ORDER — ETOMIDATE 2 MG/ML IV SOLN
INTRAVENOUS | Status: AC
  Filled 2023-10-13: qty 10

## 2023-10-13 MED ORDER — PROPOFOL 1000 MG/100ML IV EMUL
0.0000 ug/kg/min | INTRAVENOUS | Status: DC
  Administered 2023-10-13: 60 ug/kg/min via INTRAVENOUS
  Administered 2023-10-13: 15 ug/kg/min via INTRAVENOUS
  Administered 2023-10-14: 45 ug/kg/min via INTRAVENOUS
  Filled 2023-10-13 (×2): qty 100

## 2023-10-13 MED ORDER — ORAL CARE MOUTH RINSE: 15.0000 mL | OROMUCOSAL | Status: DC | PRN

## 2023-10-13 MED ORDER — MIDAZOLAM HCL 2 MG/2ML IJ SOLN
2.0000 mg | Freq: Once | INTRAMUSCULAR | Status: AC
  Administered 2023-10-13: 1 mg via INTRAVENOUS
  Filled 2023-10-13: qty 2

## 2023-10-13 MED ORDER — HEPARIN (PORCINE) 25000 UT/250ML-% IV SOLN
1050.0000 [IU]/h | INTRAVENOUS | Status: DC
  Administered 2023-10-13: 1050 [IU]/h via INTRAVENOUS
  Filled 2023-10-13: qty 250

## 2023-10-13 MED ORDER — ROCURONIUM BROMIDE 10 MG/ML (PF) SYRINGE
50.0000 mg | PREFILLED_SYRINGE | Freq: Once | INTRAVENOUS | Status: DC
  Filled 2023-10-13: qty 10

## 2023-10-13 NOTE — ED Notes (Signed)
 Family being taken to family wait and met by EDP and ICU MD

## 2023-10-13 NOTE — Progress Notes (Signed)
 Assisted in bedside bronch. Pt intubated and on mech ventilation. Procedure completed with no issues to report. RRT x4 at bedside. RRT will cont to monitor.

## 2023-10-13 NOTE — ED Notes (Signed)
 Bedside bronchoscopy in progress with Dr Isadora

## 2023-10-13 NOTE — Progress Notes (Signed)
 eLink Physician-Brief Progress Note Patient Name: Daniel [name] DOB: May 11, 1940 MRN: [id number]   Date of Service  10/13/2023  HPI/Events of Note  76 M history of Parkinson's, afib not on anticoagulation, presented with respiratory distress after choking on a hotdog. Patient was intubated and underwent bronchoscopy with successful removal of foreign body.  He is seen intubated VAC 16/400/35%/5 PEEP double triggering noted. Peak pressure 11 Adequately sedated on propofol  BP 124/83  HR 119  O2 100% on norepinephrine   K 3.4 ongoing K correction UA with > 50 WBC and positive nitrites  eICU Interventions  Discussed with BSRN who will reach out to RT regarding optimizing vent settings for synchrony as patient appears adequately sedated. Bedside provider to be notified of possible coverage for UTI and/or aspiration pneumonia     Intervention Category Evaluation Type: New Patient Evaluation  [name] 10/13/2023, 8:34 PM

## 2023-10-13 NOTE — ED Triage Notes (Signed)
 Pt arrives via [facility] after choking on a hot dog. There is residual bun left in the posterior pharynx on arrival and pt is coughing. Dr Arlander at bedside with forceps and suction.

## 2023-10-13 NOTE — ED Notes (Signed)
 Dr Isadora reduced ordered doses verbally of versed  and fentanyl .

## 2023-10-13 NOTE — ED Notes (Signed)
 Gave report to ICU, RN

## 2023-10-13 NOTE — ED Provider Notes (Signed)
 [facility] Provider Note    Event Date/Time   First MD Initiated Contact with Patient 10/13/23 1826     (approximate)   History   Choking   HPI  Daniel [name] is a 83 y.o. male with history of atrial fibrillation not on blood thinners, history of Parkinson's who presents in distress after choking on a hot dog.  EMS bagging the patient with severe initial hypoxia now improved.       Physical Exam   Triage Vital Signs: ED Triage Vitals  Encounter Vitals Group     BP 10/13/23 1821 (!) 162/96     Girls Systolic BP Percentile --      Girls Diastolic BP Percentile --      Boys Systolic BP Percentile --      Boys Diastolic BP Percentile --      Pulse Rate 10/13/23 1819 (!) 155     Resp 10/13/23 1819 (!) 24     Temp --      Temp src --      SpO2 10/13/23 1819 100 %     Weight 10/13/23 1827 74 kg (163 lb 2.3 oz)     Height --      Head Circumference --      Peak Flow --      Pain Score --      Pain Loc --      Pain Education --      Exclude from Growth Chart --     Most recent vital signs: Vitals:   10/13/23 1923 10/13/23 1938  BP: 98/86 125/75  Pulse: (!) 133 (!) 142  Resp: 16 14  Temp: 97.7 F (36.5 C) (!) 97.4 F (36.3 C)  SpO2: 100% 100%     General: Awake, but not responding to voice commands, moaning with stridulous sounds CV:  Good peripheral perfusion.  Resp:  Normal effort.  Abd:  No distention.  Other:  Pharynx: No food seen   ED Results / Procedures / Treatments   Labs (all labs ordered are listed, but only abnormal results are displayed) Labs Reviewed  COMPREHENSIVE METABOLIC PANEL WITH GFR - Abnormal; Notable for the following components:      Result Value   Potassium 3.4 (*)    CO2 18 (*)    Glucose, Bld 177 (*)    BUN 39 (*)    AST 51 (*)    All other components within normal limits  URINALYSIS, W/ REFLEX TO CULTURE (INFECTION SUSPECTED) - Abnormal; Notable for the following components:   Color, Urine  YELLOW (*)    APPearance CLOUDY (*)    Hgb urine dipstick MODERATE (*)    Ketones, ur 5 (*)    Protein, ur 30 (*)    Nitrite POSITIVE (*)    Leukocytes,Ua LARGE (*)    Bacteria, UA MANY (*)    All other components within normal limits  URINE CULTURE  MRSA NEXT GEN BY PCR, NASAL  CULTURE, RESPIRATORY W GRAM STAIN  CBC  APTT  PROTIME-INR  BASIC METABOLIC PANEL WITH GFR  TROPONIN I (HIGH SENSITIVITY)     EKG  ED ECG REPORT I, Lamar Price, the attending physician, personally viewed and interpreted this ECG.  Date: 10/13/2023  Rhythm: Atrial fibrillation with RVR QRS Axis: normal Intervals: Abnormal ST/T Wave abnormalities: normal Narrative Interpretation: no evidence of acute ischemia    RADIOLOGY Chest x-ray view interpret by me, ET tube close to right mainstem, will pull back  2 cm   PROCEDURES:  Critical Care performed: yes  CRITICAL CARE Performed by: Lamar Price   Total critical care time: 40 minutes  Critical care time was exclusive of separately billable procedures and treating other patients.  Critical care was necessary to treat or prevent imminent or life-threatening deterioration.  Critical care was time spent personally by me on the following activities: development of treatment plan with patient and/or surrogate as well as nursing, discussions with consultants, evaluation of patient's response to treatment, examination of patient, obtaining history from patient or surrogate, ordering and performing treatments and interventions, ordering and review of laboratory studies, ordering and review of radiographic studies, pulse oximetry and re-evaluation of patient's condition.   Procedures   MEDICATIONS ORDERED IN ED: Medications  propofol  (DIPRIVAN ) 1000 MG/100ML infusion (55 mcg/kg/min  74 kg (Order-Specific) Intravenous Rate/Dose Change 10/13/23 1928)  norepinephrine  (LEVOPHED ) 4mg  in (0.016 mg/mL) premix infusion (9.5 mcg/min Intravenous  Rate/Dose Change 10/13/23 1919)  Chlorhexidine  Gluconate Cloth 2 % PADS 6 each (has no administration in time range)  docusate sodium  (COLACE) capsule 100 mg (has no administration in time range)  polyethylene glycol (MIRALAX  / GLYCOLAX ) packet 17 g (has no administration in time range)  enoxaparin  (LOVENOX ) injection 40 mg (has no administration in time range)  pantoprazole  (PROTONIX ) injection 40 mg (has no administration in time range)  insulin  aspart (novoLOG ) injection 0-6 Units (has no administration in time range)  potassium chloride  10 mEq in 100 mL IVPB (has no administration in time range)  Oral care mouth rinse (has no administration in time range)  Oral care mouth rinse (has no administration in time range)  etomidate  (AMIDATE ) injection 20 mg (20 mg Intravenous Given 10/13/23 1821)  succinylcholine  (ANECTINE ) syringe 100 mg (100 mg Intravenous Given 10/13/23 1822)  LORazepam  (ATIVAN ) injection 1 mg (1 mg Intravenous Given 10/13/23 1841)  fentaNYL  (SUBLIMAZE ) injection 200 mcg (100 mcg Intravenous Given 10/13/23 1910)  midazolam  (VERSED ) injection 2 mg (1 mg Intravenous Given 10/13/23 1910)     IMPRESSION / MDM / ASSESSMENT AND PLAN / ED COURSE  I reviewed the triage vital signs and the nursing notes. Patient's presentation is most consistent with acute presentation with potential threat to life or bodily function.  Patient presents after choking episode, and respiratory distress here with stridulous sounds, decision made to intubate.  During intubation food particles seen in posterior pharynx, unable to grasp with forceps.  Consulted Dr. Isadora of ICU, he will do bedside bronchoscopy  Labs reviewed and overall unremarkable.  Dr. Isadora removed hot dog from lung, will admit the patient. Family has been updated throughout ED stay      FINAL CLINICAL IMPRESSION(S) / ED DIAGNOSES   Final diagnoses:  Acute hypoxic respiratory failure (HCC)  Aspiration into airway, initial  encounter     Rx / DC Orders   ED Discharge Orders     None        Note:  This document was prepared using Dragon voice recognition software and may include unintentional dictation errors.   Price Lamar, MD 10/13/23 2016

## 2023-10-13 NOTE — Consult Note (Signed)
 Pharmacy Consult Note - Anticoagulation  Pharmacy Consult for Heparin  Indication: atrial fibrillation Allergies  Allergen Reactions   Clindamycin /Lincomycin Hives   Clindamycin  Hives   Doxycycline Rash   Hydrocodone -Acetaminophen  Rash   Levaquin [Levofloxacin] Rash    PATIENT MEASUREMENTS: Height: 6' 2 (188 cm) Weight: 74 kg (163 lb 2.3 oz) IBW/kg (Calculated) : 82.2 HEPARIN  DW (KG): 74  VITAL SIGNS: Temp: 96.8 F (36 C) (09/21 2100) Temp Source: Oral (09/21 2100) BP: 142/94 (09/21 2100) Pulse Rate: 116 (09/21 2100)  Recent Labs    10/13/23 1828  HGB 14.6  HCT 44.0  PLT 279  APTT 29  LABPROT 14.4  INR 1.1  CREATININE 1.09  TROPONINIHS 8    Estimated Creatinine Clearance: 54.7 mL/min (by C-G formula based on SCr of 1.09 mg/dL).  PAST MEDICAL HISTORY: Past Medical History:  Diagnosis Date   AF (atrial fibrillation) (HCC)    B12 deficiency    Coronary artery disease    DH (dermatitis herpetiformis)    Dysphagia    Hypertension    Presence of permanent cardiac pacemaker    2006   Primary parkinsonism [facility])    Prostate cancer (HCC) 2014    Medications:  Medications Prior to Admission  Medication Sig Dispense Refill Last Dose/Taking   acetaminophen  (TYLENOL ) 325 MG tablet Take 1-2 tablets (325-650 mg total) by mouth every 6 (six) hours as needed for mild pain (pain score 1-3) (or temp > 100.5).   Taking As Needed   carbidopa -levodopa  (SINEMET  CR) 50-200 MG tablet Take 1 tablet by mouth at bedtime.   10/12/2023   carbidopa -levodopa  (SINEMET  IR) 25-100 MG tablet Take 2 tablets by mouth with breakfast, with lunch, and with evening meal.   10/13/2023   cyanocobalamin  1000 MCG tablet Take 1,000 mcg by mouth daily.   10/13/2023   bisacodyl  (DULCOLAX) 10 MG suppository Place 1 suppository (10 mg total) rectally daily as needed for moderate constipation. (Patient not taking: Reported on 10/13/2023)   Not Taking   cholecalciferol (VITAMIN D) 25 MCG (1000 UT) tablet Take  1,000 Units by mouth daily. (Patient not taking: Reported on 10/13/2023)   Not Taking   dapsone  25 MG tablet Take 25 mg by mouth 3 (three) times a week. (Patient not taking: Reported on 10/13/2023)   Not Taking   docusate sodium  (COLACE) 100 MG capsule Take 1 capsule (100 mg total) by mouth 2 (two) times daily. (Patient not taking: Reported on 10/13/2023)   Not Taking   latanoprost  (XALATAN ) 0.005 % ophthalmic solution Place 1 drop into both eyes at bedtime.  (Patient not taking: Reported on 10/13/2023)   Not Taking   magnesium  hydroxide (MILK OF MAGNESIA) 400 MG/5ML suspension Take 30 mLs by mouth daily as needed for mild constipation. (Patient not taking: Reported on 10/13/2023)   Not Taking   metoprolol  succinate (TOPROL -XL) 25 MG 24 hr tablet Take 0.5 tablets (12.5 mg total) by mouth daily. ** DO NOT CRUSH **    (BETA BLOCKER)  - HOLD FOR SBP <110, DBP <70 (Patient not taking: Reported on 10/13/2023)   Not Taking   sodium phosphate  (FLEET) ENEM Place 133 mLs (1 enema total) rectally once as needed for severe constipation. (Patient not taking: Reported on 10/13/2023)   Not Taking   Scheduled:   Chlorhexidine  Gluconate Cloth  6 each Topical Daily   heparin   3,700 Units Intravenous Once   insulin  aspart  0-6 Units Subcutaneous Q4H   mouth rinse  15 mL Mouth Rinse Q2H   pantoprazole  (  PROTONIX ) IV  40 mg Intravenous QHS   Infusions:   amiodarone      [START ON 10/14/2023] amiodarone      amiodarone      heparin      norepinephrine  (LEVOPHED ) Adult infusion 9.5 mcg/min (10/13/23 2100)   potassium chloride  100 mL/hr at 10/13/23 2100   propofol  (DIPRIVAN ) infusion 55 mcg/kg/min (10/13/23 2100)   PRN: docusate sodium , mouth rinse, polyethylene glycol  ASSESSMENT: 83 y.o. male with PMH Atrial fibrillation is presenting with severe hypoxia. Patient is not on chronic anticoagulation per chart review. Pharmacy has been consulted to initiate and manage heparin  intravenous infusion.   Goal(s) of  therapy: Heparin  level 0.3 - 0.7 units/mL aPTT 66 - 102 seconds Monitor platelets by anticoagulation protocol: Yes   Baseline anticoagulation labs: Recent Labs    10/13/23 1828  APTT 29  INR 1.1  HGB 14.6  PLT 279     PLAN:  Give 3700 units bolus x1; then start heparin  infusion at 1050 units/hour.  Check heparin  level in 8 hours, then daily once at least two levels are consecutively therapeutic.  Monitor CBC daily while on heparin  infusion.    Annabella LOISE Banks, PharmD Clinical Pharmacist 10/13/2023 9:53 PM

## 2023-10-13 NOTE — H&P (Signed)
 NAME:  Daniel [name], MRN:  [id number], DOB:  [birth year], LOS: 0 ADMISSION DATE:  10/13/2023, CONSULTATION DATE: 10/13/2023 REFERRING MD: Arlander, CHIEF COMPLAINT: Aspiration  HPI  83 y.o male with significant PMH of permanent Atrial Fibrillation, SSS s/p pacemaker, Parkinson's Disease, SDH, glaucoma, Campylobacter enteritis, right heel pressure ulcer who presented to the ED in respiratory distress and hypoxia after a choking event on hotdog.   ED Course: Initial vital signs showed HR of 155 beats/minute, BP 162/96 mm Hg, the RR 24 breaths/minute, and the oxygen saturation 100% on NRB and a temperature of 97.44F (36.3C).   Pertinent Labs/Diagnostics Findings: Na+/ K+: 137/3.4.  Glucose: 77 BUN/Cr.:  39/1.09  AST/ALT: 51/14 Unremarkable CBC Lactic acid:  UA positive  Patient was intubated for airway protection and PCCM consulted for STAT Bronchoscopy. Flexible Bronchoscopy was performed at the bediside for foreign body extraction with findings of foreign body in the RLL bronchus, extracted through a roth net retrieval device. Patient transferred to the ICU post procedure.  Past Medical History  permanent Atrial Fibrillation, SSS s/p pacemaker, Parkinson's Disease, SDH, glaucoma, Campylobacter enteritis, right heel pressure ulcer   Significant Hospital Events   9/21: Admitted to ICU with acute hypoxic respiratory failure in the setting of foreign body aspiration  Consults:  PCCM  Procedures:  9/21: Flexible bronchoscopy  Interim History / Subjective:    -See significant events above  Micro Data:  9/21: Blood culture x2> 9/21: Urine Culture> 9/21: MRSA PCR>>   Antimicrobials:  Ceftriaxone   OBJECTIVE  Blood pressure (!) 142/94, pulse (!) 116, temperature (!) 96.8 F (36 C), temperature source Oral, resp. rate 14, height 6' 2 (1.88 m), weight 74 kg, SpO2 100%.    Vent Mode: PRVC FiO2 (%):  [35 %-100 %] 35 % Set Rate:  [16 bmp] 16 bmp Vt Set:  [400 mL-480 mL] 400  mL PEEP:  [5 cmH20] 5 cmH20 Plateau Pressure:  [18 cmH20] 18 cmH20   Intake/Output Summary (Last 24 hours) at 10/13/2023 2239 Last data filed at 10/13/2023 2100 Gross per 24 hour  Intake 147.13 ml  Output --  Net 147.13 ml   Filed Weights   10/13/23 1827 10/13/23 2100  Weight: 74 kg 74 kg   Physical Examination  GEN: Critically ill patient, intubated and sedated HEENT: [place]/AT. PERRL, sclerae anicteric. HEART: Irregular rhythm, normal rate, S1, S2, no M/R/G,  LUNGS: CTAB, mild crackles without wheezes, no increased WOB,  EXTREMITIES: No Edema NEURO: No gross focal deficits. PSYCH:  UTA ABDOMINAL: Soft: BS x 4, NTND SKIN: Intact, warm, no rashes lesion, or ulcer  Labs/imaging that I havepersonally reviewed  (right click and Reselect all SmartList Selections daily)  ED ECG REPORT I, Almarie Nose , NP, personally viewed and interpreted this ECG.   Date: 10/13/2023   Rhythm: Atrial fibrillation with RVR Rate 144 QRS Axis: normal Intervals: Abnormal ST/T Wave abnormalities: normal Narrative Interpretation: no evidence of acute ischemia  DG Chest Port 1 View Result Date: 10/13/2023 CLINICAL DATA:  Post bronchoscopy EXAM: PORTABLE CHEST 1 VIEW COMPARISON:  Chest x-ray 10/13/2023 FINDINGS: Endotracheal tube tip is 2 cm above the carina. Enteric tube tip is in the mid stomach. Left-sided pacemaker again seen. The heart is enlarged. There some mild patchy airspace opacities in the left upper lobe. The lungs are otherwise clear. No pleural effusion or pneumothorax. IMPRESSION: 1. Endotracheal tube tip is 2 cm above the carina. 2. Mild patchy airspace opacities in the left upper lobe. Electronically Signed   By: Amy  Maple M.D.   On: 10/13/2023 19:42   DG Chest Port 1 View Result Date: 10/13/2023 CLINICAL DATA:  Endotracheal and OG tube placement verification. EXAM: PORTABLE CHEST 1 VIEW, ABDOMEN ONE VIEW COMPARISON:  06/13/2023. FINDINGS: The heart size and mediastinal contours are  stable. There is atherosclerotic calcification of the aorta. No consolidation, effusion, or pneumothorax is seen. A multi lead pacemaker device is present over the left chest. An endotracheal tube is at the carina and should be retracted approximately 3 cm at. A nonobstructive bowel gas pattern is noted in the upper abdomen. Enteric tube terminates in the stomach and appears appropriate in position. IMPRESSION: 1. Endotracheal tube is at the carina and should be retracted approximately 3 cm. 2. Orogastric tube in appropriate position. Electronically Signed   By: Leita Birmingham M.D.   On: 10/13/2023 18:53   DG Abd 1 View Result Date: 10/13/2023 CLINICAL DATA:  Endotracheal and OG tube placement verification. EXAM: PORTABLE CHEST 1 VIEW, ABDOMEN ONE VIEW COMPARISON:  06/13/2023. FINDINGS: The heart size and mediastinal contours are stable. There is atherosclerotic calcification of the aorta. No consolidation, effusion, or pneumothorax is seen. A multi lead pacemaker device is present over the left chest. An endotracheal tube is at the carina and should be retracted approximately 3 cm at. A nonobstructive bowel gas pattern is noted in the upper abdomen. Enteric tube terminates in the stomach and appears appropriate in position. IMPRESSION: 1. Endotracheal tube is at the carina and should be retracted approximately 3 cm. 2. Orogastric tube in appropriate position. Electronically Signed   By: Leita Birmingham M.D.   On: 10/13/2023 18:53    Labs   CBC: Recent Labs  Lab 10/13/23 1828  WBC 10.1  HGB 14.6  HCT 44.0  MCV 88.0  PLT 279   Basic Metabolic Panel: Recent Labs  Lab 10/13/23 1828  NA 137  K 3.4*  CL 104  CO2 18*  GLUCOSE 177*  BUN 39*  CREATININE 1.09  CALCIUM 9.2   GFR: Estimated Creatinine Clearance: 54.7 mL/min (by C-G formula based on SCr of 1.09 mg/dL). Recent Labs  Lab 10/13/23 1828  WBC 10.1   Liver Function Tests: Recent Labs  Lab 10/13/23 1828  AST 51*  ALT 14  ALKPHOS  93  BILITOT 0.9  PROT 7.5  ALBUMIN 3.9   No results for input(s): LIPASE, AMYLASE in the last 168 hours. No results for input(s): AMMONIA in the last 168 hours.  ABG No results found for: PHART, PCO2ART, PO2ART, HCO3, TCO2, ACIDBASEDEF, O2SAT   Coagulation Profile: Recent Labs  Lab 10/13/23 1828  INR 1.1   Cardiac Enzymes: No results for input(s): CKTOTAL, CKMB, CKMBINDEX, TROPONINI in the last 168 hours.  HbA1C: No results found for: HGBA1C  CBG: No results for input(s): GLUCAP in the last 168 hours.  Review of Systems:   Unable to be obtained secondary to the patient's intubated and sedated status.   Past Medical History  He,  has a past medical history of AF (atrial fibrillation) (HCC), B12 deficiency, Coronary artery disease, DH (dermatitis herpetiformis), Dysphagia, Hypertension, Presence of permanent cardiac pacemaker, Primary parkinsonism (HCC), and Prostate cancer (HCC) (2014).   Surgical History    Past Surgical History:  Procedure Laterality Date   APPENDECTOMY     CARDIAC CATHETERIZATION     CATARACT EXTRACTION W/PHACO Left 05/04/2020   Procedure: CATARACT EXTRACTION PHACO AND INTRAOCULAR LENS PLACEMENT (IOC) LEFT  6.76 00:59.9 11.3%;  Surgeon: Mittie Gaskin, MD;  Location: [facility];  Service: Ophthalmology;  Laterality: Left;   CATARACT EXTRACTION W/PHACO Right 05/18/2020   Procedure: CATARACT EXTRACTION PHACO AND INTRAOCULAR LENS PLACEMENT (IOC) RIGHT 15.56 01:52.6 13.8%;  Surgeon: Mittie Gaskin, MD;  Location: [facility];  Service: Ophthalmology;  Laterality: Right;   COLONOSCOPY  2008, 2014   CYSTOSCOPY WITH LITHOLAPAXY N/A 09/17/2019   Procedure: CYSTOSCOPY WITH LITHOLAPAXY;  Surgeon: Kassie Ozell SAUNDERS, MD;  Location: [facility];  Service: Urology;  Laterality: N/A;   ESOPHAGOGASTRODUODENOSCOPY (EGD) WITH PROPOFOL  N/A 10/13/2015   Procedure: ESOPHAGOGASTRODUODENOSCOPY (EGD) WITH PROPOFOL ;   Surgeon: Gladis RAYMOND Mariner, MD;  Location: [facility];  Service: Endoscopy;  Laterality: N/A;   FASCIOTOMY Left 01/30/2019   Procedure: I&D BELOW FASCIA FOOT SINGLE LEFT;  Surgeon: Ashley Soulier, DPM;  Location: [facility];  Service: Podiatry;  Laterality: Left;   GREEN LIGHT LASER TURP (TRANSURETHRAL RESECTION OF PROSTATE N/A 09/17/2019   Procedure: GREEN LIGHT LASER TURP (TRANSURETHRAL RESECTION OF PROSTATE;  Surgeon: Kassie Ozell SAUNDERS, MD;  Location: [facility];  Service: Urology;  Laterality: N/A;   HIP ARTHROPLASTY Left 04/22/2023   Procedure: HEMIARTHROPLASTY (BIPOLAR) HIP, POSTERIOR APPROACH FOR FRACTURE;  Surgeon: Edie Norleen PARAS, MD;  Location: [facility];  Service: Orthopedics;  Laterality: Left;   PACEMAKER PLACEMENT  2006    Social History   reports that he has never smoked. He has never used smokeless tobacco. He reports that he does not currently use alcohol . He reports that he does not use drugs.   Family History   His family history includes Heart attack in his father and mother.   Allergies Allergies  Allergen Reactions   Clindamycin /Lincomycin Hives   Clindamycin  Hives   Doxycycline Rash   Hydrocodone -Acetaminophen  Rash   Levaquin [Levofloxacin] Rash   Home Medications  Prior to Admission medications   Medication Sig Start Date End Date Taking? Authorizing Provider  acetaminophen  (TYLENOL ) 325 MG tablet Take 1-2 tablets (325-650 mg total) by mouth every 6 (six) hours as needed for mild pain (pain score 1-3) (or temp > 100.5). 04/26/23  Yes Alexander, Natalie, DO  carbidopa -levodopa  (SINEMET  CR) 50-200 MG tablet Take 1 tablet by mouth at bedtime. 08/07/19  Yes [provider]  carbidopa -levodopa  (SINEMET  IR) 25-100 MG tablet Take 2 tablets by mouth with breakfast, with lunch, and with evening meal. 04/26/23  Yes Marsa Edelman, DO  cyanocobalamin  1000 MCG tablet Take 1,000 mcg by mouth daily.   Yes [provider]  bisacodyl  (DULCOLAX) 10 MG suppository Place  1 suppository (10 mg total) rectally daily as needed for moderate constipation. Patient not taking: Reported on 10/13/2023 04/26/23   Alexander, Natalie, DO  cholecalciferol (VITAMIN D) 25 MCG (1000 UT) tablet Take 1,000 Units by mouth daily. Patient not taking: Reported on 10/13/2023    [provider]  dapsone  25 MG tablet Take 25 mg by mouth 3 (three) times a week. Patient not taking: Reported on 10/13/2023 12/17/22   [provider]  docusate sodium  (COLACE) 100 MG capsule Take 1 capsule (100 mg total) by mouth 2 (two) times daily. Patient not taking: Reported on 10/13/2023 04/26/23   Alexander, Natalie, DO  latanoprost  (XALATAN ) 0.005 % ophthalmic solution Place 1 drop into both eyes at bedtime.  Patient not taking: Reported on 10/13/2023    [provider]  magnesium  hydroxide (MILK OF MAGNESIA) 400 MG/5ML suspension Take 30 mLs by mouth daily as needed for mild constipation. Patient not taking: Reported on 10/13/2023 04/26/23   Alexander, Natalie, DO  metoprolol  succinate (TOPROL -XL) 25 MG 24  hr tablet Take 0.5 tablets (12.5 mg total) by mouth daily. ** DO NOT CRUSH **    (BETA BLOCKER)  - HOLD FOR SBP <110, DBP <70 Patient not taking: Reported on 10/13/2023 04/27/23   Alexander, Natalie, DO  sodium phosphate  (FLEET) ENEM Place 133 mLs (1 enema total) rectally once as needed for severe constipation. Patient not taking: Reported on 10/13/2023 04/26/23   Alexander, Natalie, DO  Scheduled Meds:  Chlorhexidine  Gluconate Cloth  6 each Topical Daily   insulin  aspart  0-6 Units Subcutaneous Q4H   mouth rinse  15 mL Mouth Rinse Q2H   pantoprazole  (PROTONIX ) IV  40 mg Intravenous QHS   Continuous Infusions:  amiodarone  60 mg/hr (10/14/23 0100)   amiodarone      fentaNYL  infusion INTRAVENOUS 25 mcg/hr (10/14/23 0100)   heparin  1,050 Units/hr (10/14/23 0100)   norepinephrine  (LEVOPHED ) Adult infusion 9.5 mcg/min (10/14/23 0100)   propofol  (DIPRIVAN ) infusion 45 mcg/kg/min (10/14/23  0103)   PRN Meds:.docusate sodium , mouth rinse, polyethylene glycol  Active Hospital Problem list   See systems below  Assessment & Plan:  #Acute Hypoxic Respiratory Failure iso: #Foreign Body Aspiration s/p Flexible Bronchoscopy with findings of foreign body in the RLL bronchus extracted -con't full mechanical support 6-8cc/kg/Vt  -titrate FiO2, PEEP to maintain O2 sat >90%  -Lung protective ventilation  -PRN Chest X-ray & ABG -PRN and scheduled bronchodilators -Start ceftriaxone  for aspiration -SAT/SBT when appropriate  -prn fentanyl , prop for RASS -1   #AFib RVR Hx of Paroxysmal Atrial Fibrillation Sick Sinus Syndromes/p Dual-Chamber pacemaker In Situ  -HR's 140's -Start Amiodarone  150 mg IV bolus, then 1mg /min x6 hrs, then 0.5mg /min for 18 hours  -Not on anticoagulation due to history of SDH and recurrent hematuria, will start on Heparin  and monitor -baseline LFTs: AST/ALT: -TSH , Free T4  -Cardiology consult   #UTI -F/u cultures, trend lactic/ PCT -Monitor WBC/ fever curve -IV antibiotics Ceftriaxone   -Gentle IVF hydration as needed -Pressors PRN for MAP goal >65 -Strict I/O's   #Sedation needs in setting of mechanical ventilation #Acute Metabolic Encephalopathy -CT head negative -Maintain a RASS goal of 0 to -1 -Propofol  and Fentanyl  to maintain RASS goal -Avoid sedating medications as able -Daily wake up assessment  #History of Parkinson's disease Follows with [facility] neurology Dr. Jannett chart -On Carbidopa  Levodopa     Best practice:  Diet:  NPO Pain/Anxiety/Delirium protocol (if indicated): Yes (RASS goal -1) VAP protocol (if indicated): Yes DVT prophylaxis: Systemic AC GI prophylaxis: PPI Glucose control:  SSI Yes Central venous access:  N/A Arterial line:  N/A Foley:  Yes, and it is still needed Mobility:  bed rest  PT consulted: N/A Last date of multidisciplinary goals of care discussion []  Code Status:  full code Disposition: ICU   = Goals  of Care =  Primary Emergency Contact: Dasani, Crear, Home Phone: [phone]  Critical care time: 45 minutes        Almarie Nose DNP, CCRN, FNP-C, AGACNP-BC Acute Care & Family Nurse Practitioner [facility] PCCM on call pager [phone]

## 2023-10-13 NOTE — Procedures (Signed)
 Bronchoscopy Procedure Note  KUTLER VANVRANKEN  [id number]  1940-02-21  Date:10/13/23  Time:7:22 PM   Provider Performing:Triana Coover   Procedure(s):  Flexible Bronchoscopy [phone]) and Foreign Body extraction  Indication(s) Aspiration of foreign body into airway  Consent Risks of the procedure as well as the alternatives and risks of each were explained to the patient and/or caregiver.  Consent for the procedure was obtained and is signed in the bedside chart  Anesthesia Propofol  gtt, fentanyl  100 mcg, midazolam  1 mg.   Time Out Verified patient identification, verified procedure, site/side was marked, verified correct patient position, special equipment/implants available, medications/allergies/relevant history reviewed, required imaging and test results available.   Sterile Technique Usual hand hygiene, masks, gowns, and gloves were used   Procedure Description Bronchoscope advanced through endotracheal tube and into airway. Airways were examined down to subsegmental level with findings noted below. There was a piece of food content (hot dog) occluding the right lower lobe bronchus. Following diagnostic evaluation, a roth net retrieval device was placed through the bronchoscope and used to grab the foreign body. This was taken out in one piece out of the airway through the ETT. At the end of the procedure, the airway was re-examined and was noted to be clear of any secretions, foreign bodies, or masses.  Findings: Foreign body in the RLL bronchus, extracted through a roth net retrieval device.  RLL bronchus with food impacted:    Bronchus intermedius post foreign body removal    RLL bronchus post foreign body removal    RML:    LUL    LLL    Extracted foreign body    Complications/Tolerance None; patient tolerated the procedure well. Chest X-ray is needed post procedure.   EBL Minimal   Belva November, MD [facility] 10/13/2023 7:27 PM

## 2023-10-14 ENCOUNTER — Encounter: Payer: Self-pay | Admitting: Student in an Organized Health Care Education/Training Program

## 2023-10-14 DIAGNOSIS — R579 Shock, unspecified: Secondary | ICD-10-CM

## 2023-10-14 DIAGNOSIS — J9601 Acute respiratory failure with hypoxia: Secondary | ICD-10-CM | POA: Diagnosis not present

## 2023-10-14 DIAGNOSIS — I4891 Unspecified atrial fibrillation: Secondary | ICD-10-CM | POA: Diagnosis not present

## 2023-10-14 DIAGNOSIS — T17908A Unspecified foreign body in respiratory tract, part unspecified causing other injury, initial encounter: Secondary | ICD-10-CM | POA: Diagnosis not present

## 2023-10-14 LAB — TSH: TSH: 6.668 u[IU]/mL — ABNORMAL HIGH (ref 0.350–4.500)

## 2023-10-14 LAB — CBC
HCT: 38.5 % — ABNORMAL LOW (ref 39.0–52.0)
Hemoglobin: 12.7 g/dL — ABNORMAL LOW (ref 13.0–17.0)
MCH: 29.4 pg (ref 26.0–34.0)
MCHC: 33 g/dL (ref 30.0–36.0)
MCV: 89.1 fL (ref 80.0–100.0)
Platelets: 208 K/uL (ref 150–400)
RBC: 4.32 MIL/uL (ref 4.22–5.81)
RDW: 15.4 % (ref 11.5–15.5)
WBC: 14.1 K/uL — ABNORMAL HIGH (ref 4.0–10.5)
nRBC: 0 % (ref 0.0–0.2)

## 2023-10-14 LAB — BASIC METABOLIC PANEL WITH GFR
Anion gap: 7 (ref 5–15)
BUN: 35 mg/dL — ABNORMAL HIGH (ref 8–23)
CO2: 22 mmol/L (ref 22–32)
Calcium: 8.6 mg/dL — ABNORMAL LOW (ref 8.9–10.3)
Chloride: 105 mmol/L (ref 98–111)
Creatinine, Ser: 0.86 mg/dL (ref 0.61–1.24)
GFR, Estimated: >60 mL/min (ref >60)
Glucose, Bld: 131 mg/dL — ABNORMAL HIGH (ref 70–99)
Potassium: 4.1 mmol/L (ref 3.5–5.1)
Sodium: 134 mmol/L — ABNORMAL LOW (ref 135–145)

## 2023-10-14 LAB — GLUCOSE, CAPILLARY
Glucose-Capillary: 106 mg/dL — ABNORMAL HIGH (ref 70–99)
Glucose-Capillary: 113 mg/dL — ABNORMAL HIGH (ref 70–99)
Glucose-Capillary: 114 mg/dL — ABNORMAL HIGH (ref 70–99)
Glucose-Capillary: 120 mg/dL — ABNORMAL HIGH (ref 70–99)
Glucose-Capillary: 121 mg/dL — ABNORMAL HIGH (ref 70–99)
Glucose-Capillary: 126 mg/dL — ABNORMAL HIGH (ref 70–99)
Glucose-Capillary: 142 mg/dL — ABNORMAL HIGH (ref 70–99)
Glucose-Capillary: 142 mg/dL — ABNORMAL HIGH (ref 70–99)

## 2023-10-14 LAB — HEPARIN LEVEL (UNFRACTIONATED): Heparin Unfractionated: 0.5 [IU]/mL (ref 0.30–0.70)

## 2023-10-14 LAB — PHOSPHORUS: Phosphorus: 3 mg/dL (ref 2.5–4.6)

## 2023-10-14 LAB — MAGNESIUM: Magnesium: 2 mg/dL (ref 1.7–2.4)

## 2023-10-14 LAB — T4, FREE: Free T4: 0.87 ng/dL (ref 0.61–1.12)

## 2023-10-14 MED ORDER — HEPARIN SODIUM (PORCINE) 5000 UNIT/ML IJ SOLN
5000.0000 [IU] | Freq: Three times a day (TID) | INTRAMUSCULAR | Status: DC
  Administered 2023-10-14 – 2023-10-18 (×12): 5000 [IU] via SUBCUTANEOUS
  Filled 2023-10-14 (×13): qty 1

## 2023-10-14 MED ORDER — SODIUM CHLORIDE 0.9 % IV SOLN
1.0000 g | INTRAVENOUS | Status: DC
  Administered 2023-10-14 – 2023-10-16 (×3): 1 g via INTRAVENOUS
  Filled 2023-10-14 (×4): qty 10

## 2023-10-14 MED ORDER — NOREPINEPHRINE 4 MG/250ML-% IV SOLN
0.0000 ug/min | INTRAVENOUS | Status: DC
Start: 2023-10-14 — End: 2023-10-15
  Administered 2023-10-14 (×2): 2 ug/min via INTRAVENOUS

## 2023-10-14 MED ORDER — ORAL CARE MOUTH RINSE
15.0000 mL | Freq: Four times a day (QID) | OROMUCOSAL | Status: DC
  Administered 2023-10-15 – 2023-10-19 (×17): 15 mL via OROMUCOSAL

## 2023-10-14 MED ORDER — CARBIDOPA-LEVODOPA 25-100 MG PO TABS
2.0000 | ORAL_TABLET | Freq: Three times a day (TID) | ORAL | Status: DC
  Administered 2023-10-14 – 2023-10-19 (×12): 2 via ORAL
  Filled 2023-10-14 (×13): qty 2

## 2023-10-14 MED ORDER — NOREPINEPHRINE 16 MG/250ML-% IV SOLN
0.0000 ug/min | INTRAVENOUS | Status: DC
  Filled 2023-10-14: qty 250

## 2023-10-14 MED ORDER — METOPROLOL TARTRATE 25 MG PO TABS
12.5000 mg | ORAL_TABLET | Freq: Two times a day (BID) | ORAL | Status: DC
  Administered 2023-10-14 – 2023-10-15 (×2): 12.5 mg via ORAL
  Filled 2023-10-14 (×2): qty 1

## 2023-10-14 MED ORDER — CARBIDOPA-LEVODOPA ER 50-200 MG PO TBCR
1.0000 | EXTENDED_RELEASE_TABLET | Freq: Every day | ORAL | Status: DC
  Administered 2023-10-14 – 2023-10-18 (×5): 1 via ORAL
  Filled 2023-10-14 (×6): qty 1

## 2023-10-14 NOTE — Progress Notes (Signed)
 Pt remains off sedation, Pt following commands. BP 98/72, HR 82, SPo2 100. Pt on spontaneous breathing. Lonell NP notified. Per NP proceed with extubation. RT notified.

## 2023-10-14 NOTE — Plan of Care (Signed)
  Problem: Coping: Goal: Ability to adjust to condition or change in health will improve Outcome: Progressing   Problem: Health Behavior/Discharge Planning: Goal: Ability to identify and utilize available resources and services will improve Outcome: Progressing Goal: Ability to manage health-related needs will improve Outcome: Progressing   Problem: Metabolic: Goal: Ability to maintain appropriate glucose levels will improve Outcome: Progressing

## 2023-10-14 NOTE — Progress Notes (Signed)
 PHARMACY CONSULT NOTE - FOLLOW UP  Pharmacy Consult for Electrolyte Monitoring and Replacement   Recent Labs: Potassium (mmol/L)  Date Value  10/14/2023 4.1  02/14/2012 4.3   Magnesium  (mg/dL)  Date Value  90/77/7974 2.0   Calcium (mg/dL)  Date Value  90/77/7974 8.6 (L)   Calcium, Total (mg/dL)  Date Value  98/76/7985 8.7   Albumin (g/dL)  Date Value  90/78/7974 3.9   Phosphorus (mg/dL)  Date Value  90/77/7974 3.0   Sodium (mmol/L)  Date Value  10/14/2023 134 (L)  02/14/2012 140     Assessment: 83 y.o male with significant PMH of permanent Atrial Fibrillation, SSS s/p pacemaker, Parkinson's Disease, SDH, glaucoma, Campylobacter enteritis, right heel pressure ulcer who presented to the ED in respiratory distress and hypoxia. Pharmacy is asked to follow and replace electrolytes while in CCU   Goal of Therapy:  Electrolytes WNL  Plan:  ---no electrolyte replacement warranted for today ---recheck electrolytes in am   Daniel [name] ,PharmD Clinical Pharmacist 10/14/2023 7:02 AM

## 2023-10-14 NOTE — Plan of Care (Signed)

## 2023-10-14 NOTE — Progress Notes (Signed)
 NAME:  Daniel [name], MRN:  [id number], DOB:  [birth year], LOS: 1 ADMISSION DATE:  10/13/2023 History of Present Illness:  83 y.o male with significant PMH of permanent Atrial Fibrillation, SSS s/p pacemaker, Parkinson's Disease, SDH, glaucoma, Campylobacter enteritis, right heel pressure ulcer who presented to the ED in respiratory distress and hypoxia after a choking event on hotdog.   ED Course: Initial vital signs showed HR of 155 beats/minute, BP 162/96 mm Hg, the RR 24 breaths/minute, and the oxygen saturation 100% on NRB and a temperature of 97.13F (36.3C).    Pertinent Labs/Diagnostics Findings: Na+/ K+: 137/3.4.  Glucose: 77 BUN/Cr.:  39/1.09  AST/ALT: 51/14 Unremarkable CBC Lactic acid:  UA positive   Patient was intubated for airway protection and PCCM consulted for STAT Bronchoscopy. Flexible Bronchoscopy was performed at the bediside for foreign body extraction with findings of foreign body in the RLL bronchus, extracted through a roth net retrieval device. Patient transferred to the ICU post procedure.  Pertinent  Medical History  permanent Atrial Fibrillation, SSS s/p pacemaker, Parkinson's Disease, SDH, glaucoma, Campylobacter enteritis, right heel pressure ulcer   Significant Hospital Events: Including procedures, antibiotic start and stop dates in addition to other pertinent events   9/21: Admitted to ICU with acute hypoxic respiratory failure in the setting of foreign body aspiration (Hot dog piece, see procedure note)  Interim History / Subjective:  Patient remains intubated and sedated. Not following commands.   Objective    Blood pressure 103/63, pulse 77, temperature 98.6 F (37 C), resp. rate 14, height 6' 2 (1.88 m), weight 69 kg, SpO2 100%.    Vent Mode: PRVC FiO2 (%):  [35 %-100 %] 35 % Set Rate:  [16 bmp] 16 bmp Vt Set:  [400 mL-480 mL] 400 mL PEEP:  [5 cmH20] 5 cmH20 Plateau Pressure:  [8 cmH20-18 cmH20] 8 cmH20   Intake/Output Summary (Last 24  hours) at 10/14/2023 0939 Last data filed at 10/14/2023 0900 Gross per 24 hour  Intake 1563.51 ml  Output 715 ml  Net 848.51 ml   Filed Weights   10/13/23 2100 10/13/23 2115 10/14/23 0615  Weight: 74 kg 68.2 kg 69 kg    Examination: General: Intubated, sedated, not following commanfs.  HENT: Supple neck, reactive pupils, EOMI Lungs: Clear bilateral air entry  Cardiovascular: Irregularly irregular rhyth, normal rate on Amiodarone  drip Abdomen: Soft, non tender, non distended Extremities: Warm and well perfused no edema.   Labs and imaging were reviewed.   Assessment and Plan  83 y.o male with significant PMH of permanent Atrial Fibrillation, SSS s/p pacemaker, Parkinson's Disease, SDH, glaucoma, Campylobacter enteritis, right heel pressure ulcer who presented to the ED in respiratory distress and hypoxia after a choking event on hotdog.  Status post bronchoscopy on 10/13/2023 with extraction of a piece if hit dog from the BI.   # Acute hypoxic respiratory failure secondary to.. # Aspiration of a piece of Hotdog status post bronchoscopy with extraction from the bronchus intermedius # Distributive shock likely secondary to the above # Persistent A-fib not on anticoagulation currently on amiodarone  drip # Sick sinus syndrome status post pacemaker # Parkinson's disease on levodopa  carbidopa   Neuro: SAT possible moving towards extubation. C/w home Carbidopa -Levodopa .  CVS: C/w Amiodarone  for now. NE for MAP > 65.  Lungs: SBT this AM GI: PPI for prophylaxis. IF not moving towards extubation today can start tube feeds.  Renal: Avoid Nephrotox agents. Monitor UOP Heme: Heparin  drip  Endo: PCO 140-180/   Critical care time:  60 minutes    Darrin Barn, MD [facility] 10/14/2023 9:55 AM

## 2023-10-14 NOTE — Consult Note (Signed)
 Pharmacy Consult Note - Anticoagulation  Pharmacy Consult for Heparin  Indication: atrial fibrillation Allergies  Allergen Reactions   Clindamycin /Lincomycin Hives   Clindamycin  Hives   Doxycycline Rash   Hydrocodone -Acetaminophen  Rash   Levaquin [Levofloxacin] Rash    PATIENT MEASUREMENTS: Height: 6' 2 (188 cm) Weight: 68.2 kg (150 lb 5.7 oz) IBW/kg (Calculated) : 82.2 HEPARIN  DW (KG): 74  VITAL SIGNS: Temp: 97.9 F (36.6 C) (09/22 0500) Temp Source: Oral (09/22 0400) BP: 105/69 (09/22 0500) Pulse Rate: 73 (09/22 0500)  Recent Labs    10/13/23 1828 10/14/23 0302  HGB 14.6 12.7*  HCT 44.0 38.5*  PLT 279 208  APTT 29  --   LABPROT 14.4  --   INR 1.1  --   HEPARINUNFRC  --  0.50  CREATININE 1.09 0.86  TROPONINIHS 8  --     Estimated Creatinine Clearance: 63.9 mL/min (by C-G formula based on SCr of 0.86 mg/dL).  PAST MEDICAL HISTORY: Past Medical History:  Diagnosis Date   AF (atrial fibrillation) (HCC)    B12 deficiency    Coronary artery disease    DH (dermatitis herpetiformis)    Dysphagia    Hypertension    Presence of permanent cardiac pacemaker    2006   Primary parkinsonism [facility])    Prostate cancer (HCC) 2014    Medications:  Medications Prior to Admission  Medication Sig Dispense Refill Last Dose/Taking   acetaminophen  (TYLENOL ) 325 MG tablet Take 1-2 tablets (325-650 mg total) by mouth every 6 (six) hours as needed for mild pain (pain score 1-3) (or temp > 100.5).   Taking As Needed   carbidopa -levodopa  (SINEMET  CR) 50-200 MG tablet Take 1 tablet by mouth at bedtime.   10/12/2023   carbidopa -levodopa  (SINEMET  IR) 25-100 MG tablet Take 2 tablets by mouth with breakfast, with lunch, and with evening meal.   10/13/2023   cyanocobalamin  1000 MCG tablet Take 1,000 mcg by mouth daily.   10/13/2023   bisacodyl  (DULCOLAX) 10 MG suppository Place 1 suppository (10 mg total) rectally daily as needed for moderate constipation. (Patient not taking: Reported on  10/13/2023)   Not Taking   cholecalciferol (VITAMIN D) 25 MCG (1000 UT) tablet Take 1,000 Units by mouth daily. (Patient not taking: Reported on 10/13/2023)   Not Taking   dapsone  25 MG tablet Take 25 mg by mouth 3 (three) times a week. (Patient not taking: Reported on 10/13/2023)   Not Taking   docusate sodium  (COLACE) 100 MG capsule Take 1 capsule (100 mg total) by mouth 2 (two) times daily. (Patient not taking: Reported on 10/13/2023)   Not Taking   latanoprost  (XALATAN ) 0.005 % ophthalmic solution Place 1 drop into both eyes at bedtime.  (Patient not taking: Reported on 10/13/2023)   Not Taking   magnesium  hydroxide (MILK OF MAGNESIA) 400 MG/5ML suspension Take 30 mLs by mouth daily as needed for mild constipation. (Patient not taking: Reported on 10/13/2023)   Not Taking   metoprolol  succinate (TOPROL -XL) 25 MG 24 hr tablet Take 0.5 tablets (12.5 mg total) by mouth daily. ** DO NOT CRUSH **    (BETA BLOCKER)  - HOLD FOR SBP <110, DBP <70 (Patient not taking: Reported on 10/13/2023)   Not Taking   sodium phosphate  (FLEET) ENEM Place 133 mLs (1 enema total) rectally once as needed for severe constipation. (Patient not taking: Reported on 10/13/2023)   Not Taking   Scheduled:   Chlorhexidine  Gluconate Cloth  6 each Topical Daily   insulin  aspart  0-6  Units Subcutaneous Q4H   mouth rinse  15 mL Mouth Rinse Q2H   pantoprazole  (PROTONIX ) IV  40 mg Intravenous QHS   Infusions:   amiodarone  30 mg/hr (10/14/23 0500)   cefTRIAXone  (ROCEPHIN )  IV Stopped (10/14/23 0455)   fentaNYL  infusion INTRAVENOUS 25 mcg/hr (10/14/23 0500)   heparin  1,050 Units/hr (10/14/23 0500)   norepinephrine  (LEVOPHED ) Adult infusion 2 mcg/min (10/14/23 0500)   propofol  (DIPRIVAN ) infusion 25 mcg/kg/min (10/14/23 0500)   PRN: docusate sodium , mouth rinse, polyethylene glycol  ASSESSMENT: 83 y.o. male with PMH Atrial fibrillation is presenting with severe hypoxia. Patient is not on chronic anticoagulation per chart review.  Pharmacy has been consulted to initiate and manage heparin  intravenous infusion.   Goal(s) of therapy: Heparin  level 0.3 - 0.7 units/mL aPTT 66 - 102 seconds Monitor platelets by anticoagulation protocol: Yes   Baseline anticoagulation labs: Recent Labs    10/13/23 1828 10/14/23 0302  APTT 29  --   INR 1.1  --   HGB 14.6 12.7*  PLT 279 208     09/22 0302 HL 0.50, therapeutic x 1   PLAN:  Continue heparin  infusion at 1050 units/hour.  Recheck heparin  level in 8 hours to confirm, then daily once at least two levels are consecutively therapeutic.  Monitor CBC daily while on heparin  infusion.   Rankin CANDIE Dills, PharmD, MBA 10/14/2023 5:20 AM

## 2023-10-15 DIAGNOSIS — J9601 Acute respiratory failure with hypoxia: Secondary | ICD-10-CM | POA: Diagnosis not present

## 2023-10-15 DIAGNOSIS — I4891 Unspecified atrial fibrillation: Secondary | ICD-10-CM

## 2023-10-15 DIAGNOSIS — T17908A Unspecified foreign body in respiratory tract, part unspecified causing other injury, initial encounter: Secondary | ICD-10-CM | POA: Diagnosis not present

## 2023-10-15 LAB — CBC
HCT: 36.1 % — ABNORMAL LOW (ref 39.0–52.0)
Hemoglobin: 12 g/dL — ABNORMAL LOW (ref 13.0–17.0)
MCH: 29.1 pg (ref 26.0–34.0)
MCHC: 33.2 g/dL (ref 30.0–36.0)
MCV: 87.6 fL (ref 80.0–100.0)
Platelets: 215 K/uL (ref 150–400)
RBC: 4.12 MIL/uL — ABNORMAL LOW (ref 4.22–5.81)
RDW: 15.2 % (ref 11.5–15.5)
WBC: 9.7 K/uL (ref 4.0–10.5)
nRBC: 0 % (ref 0.0–0.2)

## 2023-10-15 LAB — URINE CULTURE: Culture: >=100000 — AB

## 2023-10-15 LAB — BASIC METABOLIC PANEL WITH GFR
Anion gap: 9 (ref 5–15)
BUN: 30 mg/dL — ABNORMAL HIGH (ref 8–23)
CO2: 25 mmol/L (ref 22–32)
Calcium: 8.8 mg/dL — ABNORMAL LOW (ref 8.9–10.3)
Chloride: 105 mmol/L (ref 98–111)
Creatinine, Ser: 0.95 mg/dL (ref 0.61–1.24)
GFR, Estimated: >60 mL/min (ref >60)
Glucose, Bld: 100 mg/dL — ABNORMAL HIGH (ref 70–99)
Potassium: 3.6 mmol/L (ref 3.5–5.1)
Sodium: 139 mmol/L (ref 135–145)

## 2023-10-15 LAB — GLUCOSE, CAPILLARY
Glucose-Capillary: 104 mg/dL — ABNORMAL HIGH (ref 70–99)
Glucose-Capillary: 98 mg/dL (ref 70–99)
Glucose-Capillary: 95 mg/dL (ref 70–99)
Glucose-Capillary: 96 mg/dL (ref 70–99)
Glucose-Capillary: 88 mg/dL (ref 70–99)

## 2023-10-15 LAB — PHOSPHORUS: Phosphorus: 2.9 mg/dL (ref 2.5–4.6)

## 2023-10-15 LAB — MAGNESIUM: Magnesium: 1.9 mg/dL (ref 1.7–2.4)

## 2023-10-15 MED ORDER — LACTATED RINGERS IV BOLUS
500.0000 mL | Freq: Once | INTRAVENOUS | Status: AC
  Administered 2023-10-15: 500 mL via INTRAVENOUS

## 2023-10-15 MED ORDER — CHLORHEXIDINE GLUCONATE CLOTH 2 % EX PADS
6.0000 | MEDICATED_PAD | Freq: Every day | CUTANEOUS | Status: DC
  Administered 2023-10-16 – 2023-10-18 (×2): 6 via TOPICAL

## 2023-10-15 NOTE — Progress Notes (Signed)
 PHARMACY CONSULT NOTE - FOLLOW UP  Pharmacy Consult for Electrolyte Monitoring and Replacement   Recent Labs: Potassium (mmol/L)  Date Value  10/15/2023 3.6  02/14/2012 4.3   Magnesium  (mg/dL)  Date Value  90/76/7974 1.9   Calcium (mg/dL)  Date Value  90/76/7974 8.8 (L)   Calcium, Total (mg/dL)  Date Value  98/76/7985 8.7   Albumin (g/dL)  Date Value  90/78/7974 3.9   Phosphorus (mg/dL)  Date Value  90/76/7974 2.9   Sodium (mmol/L)  Date Value  10/15/2023 139  02/14/2012 140     Assessment: 83 y.o male with significant PMH of permanent Atrial Fibrillation, SSS s/p pacemaker, Parkinson's Disease, SDH, glaucoma, Campylobacter enteritis, right heel pressure ulcer who presented to the ED in respiratory distress and hypoxia. Pharmacy is asked to follow and replace electrolytes while in CCU   Goal of Therapy:  Electrolytes WNL  9/23: K 3.6, Mg 1.9, Phos 2.9  Plan:  ---no electrolyte replacement warranted for today ---recheck electrolytes in am   Because this consult was generated as part of an ICU order set and patient is transferring pharmacy will sign off for now.   Please feel free to reach out if any further assistance is needed   Ransom Blanch PGY-1 Pharmacy Resident  [facility]  10/15/2023 6:50 AM

## 2023-10-15 NOTE — Progress Notes (Signed)
   NAME:  Daniel [name], MRN:  [id number], DOB:  25-Mar-1940, LOS: 2 ADMISSION DATE:  10/13/2023 History of Present Illness:  83 y.o male with significant PMH of permanent Atrial Fibrillation, SSS s/p pacemaker, Parkinson's Disease, SDH, glaucoma, Campylobacter enteritis, right heel pressure ulcer who presented to the ED in respiratory distress and hypoxia after a choking event on hotdog.   ED Course: Initial vital signs showed HR of 155 beats/minute, BP 162/96 mm Hg, the RR 24 breaths/minute, and the oxygen saturation 100% on NRB and a temperature of 97.78F (36.3C).    Pertinent Labs/Diagnostics Findings: Na+/ K+: 137/3.4.  Glucose: 77 BUN/Cr.:  39/1.09  AST/ALT: 51/14 Unremarkable CBC Lactic acid:  UA positive   Patient was intubated for airway protection and PCCM consulted for STAT Bronchoscopy. Flexible Bronchoscopy was performed at the bediside for foreign body extraction with findings of foreign body in the RLL bronchus, extracted through a roth net retrieval device. Patient transferred to the ICU post procedure.  Pertinent  Medical History  permanent Atrial Fibrillation, SSS s/p pacemaker, Parkinson's Disease, SDH, glaucoma, Campylobacter enteritis, right heel pressure ulcer   Significant Hospital Events: Including procedures, antibiotic start and stop dates in addition to other pertinent events   9/21: Admitted to ICU with acute hypoxic respiratory failure in the setting of foreign body aspiration (Hot dog piece, see procedure note) 09/22: Extubated now on room air. Doing well.   Interim History / Subjective:  Patient remains intubated and sedated. Not following commands.   Objective    Blood pressure 94/60, pulse 64, temperature 97.6 F (36.4 C), temperature source Oral, resp. rate 15, height 6' 2 (1.88 m), weight 68.6 kg, SpO2 100%.        Intake/Output Summary (Last 24 hours) at 10/15/2023 1500 Last data filed at 10/15/2023 0951 Gross per 24 hour  Intake 160.17 ml   Output 400 ml  Net -239.83 ml   Filed Weights   10/13/23 2115 10/14/23 0615 10/15/23 0500  Weight: 68.2 kg 69 kg 68.6 kg    Examination: General: Intubated, sedated, not following commanfs.  HENT: Supple neck, reactive pupils, EOMI Lungs: Clear bilateral air entry  Cardiovascular: Irregularly irregular rhyth, normal rate on Amiodarone  drip Abdomen: Soft, non tender, non distended Extremities: Warm and well perfused no edema.   Labs and imaging were reviewed.   Assessment and Plan  83 y.o male with significant PMH of permanent Atrial Fibrillation, SSS s/p pacemaker, Parkinson's Disease, SDH, glaucoma, Campylobacter enteritis, right heel pressure ulcer who presented to the ED in respiratory distress and hypoxia after a choking event on hotdog.  Status post bronchoscopy on 10/13/2023 with extraction of a piece if hit dog from the BI.   # Acute hypoxic respiratory failure secondary to.. # Aspiration of a piece of Hotdog status post bronchoscopy with extraction from the bronchus intermedius # Distributive shock likely secondary to the above (Resolved) # Persistent A-fib not on anticoagulation  # Sick sinus syndrome status post pacemaker # Parkinson's disease on levodopa  carbidopa   Neuro: C/w home Carbidopa -Levodopa .  [facility]: d/c Amiodarone  for now. MAP > 65.  Lungs: Extubated 09/22. No issues.  GI: PPI for prophylaxis. Need speech eval and a diet plan. Renal: Avoid Nephrotox agents. Monitor UOP Heme: Heparin  for DVT prophylaxis.  Endo: PCO 140-180/   OK for transfer to [facility] under [facility] Critical care time:    Darrin Barn, MD [facility] 10/15/2023 3:00 PM

## 2023-10-15 NOTE — Evaluation (Addendum)
 Occupational Therapy Evaluation Patient Details Name: Daniel [name] MRN: [id number] DOB: [birth year] Today's Date: 10/15/2023   History of Present Illness   Patient is a 83 year old male presenting with respiratory distress and hypoxia after a choking event on hotdog requiring intubation. Now extubated. PMH: SSS s/p pacemaker, Parkinson's Disease, SDH, glaucoma, Campylobacter enteritis, right heel pressure.     Clinical Impressions Chart reviewed to date, pt greeted semi supine in bed, initially oriented to self only. After mobility attempts, pt is more oriented to situation. Pt is a poor historian on this date, so will need to confirm PLOF. He reports he amb with RW short household distances and has assist for ADL/IADL as needed. Pt requires intermittent- frequent multi modal cues throughout for task completion.Pt requires MAX A +1-2 for bed mobility, MOD A +2 for STS attempts with RW. MAX A required for LB dressing and MOD A for washing face on edge of bed. Pt with weak cough pre session, improved vocal quality and cough post mobility although it continues to appear weak. Pt is performing ADL/functional mobility below PLOF, will benefit from acute OT to address deficits and to facilitate optimal ADL/functional mobility performance.      If plan is discharge home, recommend the following:   A lot of help with walking and/or transfers;A lot of help with bathing/dressing/bathroom;Supervision due to cognitive status     Functional Status Assessment   Patient has had a recent decline in their functional status and demonstrates the ability to make significant improvements in function in a reasonable and predictable amount of time.     Equipment Recommendations   Other (comment) (defer to next venue of care)     Recommendations for Other Services         Precautions/Restrictions   Precautions Precautions: Fall Recall of Precautions/Restrictions: Impaired Restrictions Weight  Bearing Restrictions Per Provider Order: No     Mobility Bed Mobility Overal bed mobility: Needs Assistance Bed Mobility: Supine to Sit, Sit to Supine     Supine to sit: Max assist Sit to supine: Mod assist, +2 for physical assistance        Transfers Overall transfer level: Needs assistance   Transfers: Sit to/from Stand Sit to Stand: Mod assist, +2 physical assistance           General transfer comment: step by step cueing for technique      Balance Overall balance assessment: Needs assistance Sitting-balance support: Feet supported Sitting balance-Leahy Scale: Fair   Postural control: Right lateral lean, Posterior lean Standing balance support: Bilateral upper extremity supported, Reliant on assistive device for balance, During functional activity Standing balance-Leahy Scale: Poor                             ADL either performed or assessed with clinical judgement   ADL Overall ADL's : Needs assistance/impaired Eating/Feeding: Maximal assistance;Bed level Eating/Feeding Details (indicate cue type and reason): ice chips Grooming: Moderate assistance;Sitting;Wash/dry face Grooming Details (indicate cue type and reason): on edge of bed             Lower Body Dressing: Total assistance;Sitting/lateral leans Lower Body Dressing Details (indicate cue type and reason): donn socks     Toileting- Clothing Manipulation and Hygiene: Maximal assistance               Vision Patient Visual Report: No change from baseline Additional Comments: will continue to assess     Perception  Praxis         Pertinent Vitals/Pain Pain Assessment Pain Assessment: No/denies pain     Extremity/Trunk Assessment Upper Extremity Assessment Upper Extremity Assessment: Generalized weakness   Lower Extremity Assessment Lower Extremity Assessment: Defer to PT evaluation (rigidity noted throughout, BLE >BUE)       Communication  Communication Communication: Impaired Factors Affecting Communication: Reduced clarity of speech   Cognition   Behavior During Therapy: Flat affect, WFL for tasks assessed/performed Cognition: No family/caregiver present to determine baseline, Cognition impaired   Orientation impairments: Situation, Time, Place Awareness: Intellectual awareness impaired   Attention impairment (select first level of impairment): Sustained attention Executive functioning impairment (select all impairments): Problem solving                   Following commands: Impaired Following commands impaired: Follows one step commands with increased time     Cueing  General Comments   Cueing Techniques: Verbal cues;Visual cues  vss throughout, mitts off pre/post session, nurse aware   Exercises Other Exercises Other Exercises: edu re role of OT, role of rehab, discharge recommendations   Shoulder Instructions      Home Living Family/patient expects to be discharged to:: Private residence Living Arrangements: Spouse/significant other Available Help at Discharge: Family;Available 24 hours/day Type of Home: House Home Access: Stairs to enter [employer] of Steps: 4 Entrance Stairs-Rails: Right;Left Home Layout: One level     Bathroom Shower/Tub: Walk-in shower         Home Equipment: Shower seat - built [employer] fundraiser (2 wheels);Rollator (4 wheels);BSC/3in1;Wheelchair - manual;Grab bars - tub/shower   Additional Comments: pt is a poor historian, will need to confirm, information is from previous admission      Prior Functioning/Environment Prior Level of Function : Needs assist;Patient poor historian/Family not available             Mobility Comments: pt reports he amb to bathroom with RW, nursing reports wife reported pt in wheelchair much of the time ADLs Comments: pt reports he has assist for ADL/IADL, will need to confirm    OT Problem List: Decreased  strength;Decreased cognition;Impaired balance (sitting and/or standing);Decreased activity tolerance;Decreased knowledge of use of DME or AE;Decreased safety awareness   OT Treatment/Interventions: Self-care/ADL training;DME and/or AE instruction;Therapeutic activities;Balance training;Therapeutic exercise;Patient/family education;Energy conservation      OT Goals(Current goals can be found in the care plan section)   Acute Rehab OT Goals Patient Stated Goal: improve function OT Goal Formulation: With patient Time For Goal Achievement: 10/29/23 Potential to Achieve Goals: Good ADL Goals Pt Will Perform Grooming: with min assist;sitting Pt Will Perform Lower Body Dressing: with mod assist Pt Will Transfer to Toilet: with min assist;stand pivot transfer Pt Will Perform Toileting - Clothing Manipulation and hygiene: with min assist   OT Frequency:  Min 2X/week    Co-evaluation PT/OT/SLP Co-Evaluation/Treatment: Yes Reason for Co-Treatment: To address functional/ADL transfers PT goals addressed during session: Mobility/safety with mobility OT goals addressed during session: ADL's and self-care      AM-PAC OT 6 Clicks Daily Activity     Outcome Measure Help from another person eating meals?: A Lot Help from another person taking care of personal grooming?: A Lot Help from another person toileting, which includes using toliet, bedpan, or urinal?: A Lot Help from another person bathing (including washing, rinsing, drying)?: A Lot Help from another person to put on and taking off regular upper body clothing?: A Lot Help from another person to put on and  taking off regular lower body clothing?: A Lot 6 Click Score: 12   End of Session Equipment Utilized During Treatment: Rolling walker (2 wheels) Nurse Communication: Mobility status  Activity Tolerance: Patient tolerated treatment well Patient left: in bed;with call bell/phone within reach;with bed alarm set  OT Visit Diagnosis:  Other abnormalities of gait and mobility (R26.89);Muscle weakness (generalized) (M62.81)                Time: 9090-9073 OT Time Calculation (min): 17 min Charges:  OT General Charges $OT Visit: 1 Visit OT Evaluation $OT Eval High Complexity: 1 High  Therisa Sheffield, OTD OTR/L  10/15/23, 1:12 PM

## 2023-10-15 NOTE — Evaluation (Signed)
 Physical Therapy Evaluation Patient Details Name: PRESTON GARABEDIAN MRN: [id number] DOB: [birth year] Today's Date: 10/15/2023  History of Present Illness  Patient is a 83 year old male presenting with respiratory distress and hypoxia after a choking event on hotdog requiring intubation. Now extubated. PMH: SSS s/p pacemaker, Parkinson's Disease, SDH, glaucoma, Campylobacter enteritis, right heel pressure.   Clinical Impression  Patient is agreeable to PT session. He reports he lives at home with his spouse. Reportedly patient uses wheelchair as primary means of mobility at baseline. He reports he can pivot using rolling walker without assistance. Limited walking.  Today patient required +2 person assistance for bed mobility and to stand. He has stiffness in BLE. Standing balance is poor with posterior lean and external support required to maintain standing balance. He does not appear to be at his baseline level of functional independence. Recommend to continue PT to maximize independence and decrease caregiver burden. Anticipate patient would need physical caregiver assistance with mobility for safe transition home. Consider rehabilitation < 3 hours/day after this hospital stay.       If plan is discharge home, recommend the following: A lot of help with walking and/or transfers;A lot of help with bathing/dressing/bathroom;Assist for transportation;Help with stairs or ramp for entrance;Assistance with cooking/housework   Can travel by private vehicle   No    Equipment Recommendations None recommended by PT  Recommendations for Other Services       Functional Status Assessment Patient has had a recent decline in their functional status and demonstrates the ability to make significant improvements in function in a reasonable and predictable amount of time.     Precautions / Restrictions Precautions Precautions: Fall Recall of Precautions/Restrictions: Impaired Restrictions Weight Bearing  Restrictions Per Provider Order: No      Mobility  Bed Mobility Overal bed mobility: Needs Assistance Bed Mobility: Supine to Sit, Sit to Supine     Supine to sit: Max assist Sit to supine: Mod assist, +2 for physical assistance   General bed mobility comments: assistance for trunk and BLE support. cues for technique    Transfers Overall transfer level: Needs assistance Equipment used: Rolling walker (2 wheels) Transfers: Sit to/from Stand Sit to Stand: Mod assist, +2 physical assistance           General transfer comment: verbal cues for technique    Ambulation/Gait               General Gait Details: not attempted due to poor standing tolerance and generalized weakness  Stairs            Wheelchair Mobility     Tilt Bed    Modified Rankin (Stroke Patients Only)       Balance Overall balance assessment: Needs assistance Sitting-balance support: Feet supported Sitting balance-Leahy Scale: Fair Sitting balance - Comments: occasional right lean with sitting that is self corrected Postural control: Posterior lean, Right lateral lean Standing balance support: Bilateral upper extremity supported Standing balance-Leahy Scale: Poor Standing balance comment: posterior lean with standing. +2 person assistance required                             Pertinent Vitals/Pain Pain Assessment Pain Assessment: No/denies pain    Home Living Family/patient expects to be discharged to:: Private residence Living Arrangements: Spouse/significant other Available Help at Discharge: Family;Available 24 hours/day Type of Home: House Home Access: Stairs to enter Entrance Stairs-Rails: Right;Left Entrance Stairs-Number of Steps: 4  Home Layout: One level Home Equipment: Shower seat - built Char[employer] fundraiser (2 wheels);Rollator (4 wheels);BSC/3in1;Wheelchair - manual;Grab bars - tub/shower Additional Comments: some information is from previous hospital  stay    Prior Function Prior Level of Function : Needs assist             Mobility Comments: limited short distance ambulation to bathroom with rolling walker, mostly in a wheelchair or his recliner chair ADLs Comments: caregiver support for dressing     Extremity/Trunk Assessment   Upper Extremity Assessment Upper Extremity Assessment: Defer to OT evaluation    Lower Extremity Assessment Lower Extremity Assessment: Generalized weakness (rigid throughout)       Communication   Communication Communication: Impaired Factors Affecting Communication: Reduced clarity of speech    Cognition Arousal: Alert Behavior During Therapy: WFL for tasks assessed/performed   PT - Cognitive impairments: No family/caregiver present to determine baseline, Initiation, Sequencing, Difficult to assess Difficult to assess due to: Impaired communication                       Following commands: Impaired Following commands impaired: Follows one step commands with increased time     Cueing Cueing Techniques: Verbal cues, Visual cues     General Comments General comments (skin integrity, edema, etc.): vitals stable throughout session. mitts off at end of session- RN aware    Exercises     Assessment/Plan    PT Assessment Patient needs continued PT services  PT Problem List Decreased strength;Decreased range of motion;Decreased activity tolerance;Decreased balance;Decreased mobility;Decreased cognition;Decreased safety awareness;Impaired tone       PT Treatment Interventions DME instruction;Gait training;Functional mobility training;Stair training;Therapeutic activities;Therapeutic exercise;Balance training;Neuromuscular re-education;Cognitive remediation;Patient/family education;Wheelchair mobility training    PT Goals (Current goals can be found in the Care Plan section)  Acute Rehab PT Goals Patient Stated Goal: none stated PT Goal Formulation: With patient Time For Goal  Achievement: 10/29/23 Potential to Achieve Goals: Good    Frequency Min 2X/week     Co-evaluation PT/OT/SLP Co-Evaluation/Treatment: Yes Reason for Co-Treatment: To address functional/ADL transfers PT goals addressed during session: Mobility/safety with mobility         AM-PAC PT 6 Clicks Mobility  Outcome Measure Help needed turning from your back to your side while in a flat bed without using bedrails?: A Lot Help needed moving from lying on your back to sitting on the side of a flat bed without using bedrails?: A Lot Help needed moving to and from a bed to a chair (including a wheelchair)?: Total Help needed standing up from a chair using your arms (e.g., wheelchair or bedside chair)?: Total Help needed to walk in hospital room?: Total Help needed climbing 3-5 steps with a railing? : Total 6 Click Score: 8    End of Session   Activity Tolerance: Patient limited by fatigue Patient left: in bed;with call bell/phone within reach;with bed alarm set;with SCD's reapplied Nurse Communication: Mobility status PT Visit Diagnosis: Muscle weakness (generalized) (M62.81);Unsteadiness on feet (R26.81)    Time: 9090-9074 PT Time Calculation (min) (ACUTE ONLY): 16 min   Charges:   PT Evaluation $PT Eval Moderate Complexity: 1 Mod   PT General Charges $$ ACUTE PT VISIT: 1 Visit         Randine Essex, PT, MPT   Randine LULLA Essex 10/15/2023, 10:05 AM

## 2023-10-15 NOTE — Progress Notes (Signed)
 Pt transfer to 1C room 112. Report given to spencer RN.

## 2023-10-15 NOTE — Plan of Care (Signed)

## 2023-10-15 NOTE — Evaluation (Signed)
 Clinical/Bedside Swallow Evaluation Patient Details  Name: Daniel [name] MRN: [id number] Date of Birth: [birth year]  Today's Date: 10/15/2023 Time: SLP Start Time (ACUTE ONLY): 0850 SLP Stop Time (ACUTE ONLY): 0909 SLP Time Calculation (min) (ACUTE ONLY): 19 min  Past Medical History:  Past Medical History:  Diagnosis Date   AF (atrial fibrillation) (HCC)    B12 deficiency    Coronary artery disease    DH (dermatitis herpetiformis)    Dysphagia    Hypertension    Presence of permanent cardiac pacemaker    2006   Primary parkinsonism [facility])    Prostate cancer (HCC) 2014   Past Surgical History:  Past Surgical History:  Procedure Laterality Date   APPENDECTOMY     CARDIAC CATHETERIZATION     CATARACT EXTRACTION W/PHACO Left 05/04/2020   Procedure: CATARACT EXTRACTION PHACO AND INTRAOCULAR LENS PLACEMENT (IOC) LEFT  6.76 00:59.9 11.3%;  Surgeon: Mittie Gaskin, MD;  Location: [facility];  Service: Ophthalmology;  Laterality: Left;   CATARACT EXTRACTION W/PHACO Right 05/18/2020   Procedure: CATARACT EXTRACTION PHACO AND INTRAOCULAR LENS PLACEMENT (IOC) RIGHT 15.56 01:52.6 13.8%;  Surgeon: Mittie Gaskin, MD;  Location: [facility];  Service: Ophthalmology;  Laterality: Right;   COLONOSCOPY  2008, 2014   CYSTOSCOPY WITH LITHOLAPAXY N/A 09/17/2019   Procedure: CYSTOSCOPY WITH LITHOLAPAXY;  Surgeon: Kassie Ozell SAUNDERS, MD;  Location: [facility] ORS;  Service: Urology;  Laterality: N/A;   ESOPHAGOGASTRODUODENOSCOPY (EGD) WITH PROPOFOL  N/A 10/13/2015   Procedure: ESOPHAGOGASTRODUODENOSCOPY (EGD) WITH PROPOFOL ;  Surgeon: Gladis RAYMOND Mariner, MD;  Location: [facility];  Service: Endoscopy;  Laterality: N/A;   FASCIOTOMY Left 01/30/2019   Procedure: I&D BELOW FASCIA FOOT SINGLE LEFT;  Surgeon: Ashley Soulier, DPM;  Location: [facility] ORS;  Service: Podiatry;  Laterality: Left;   GREEN LIGHT LASER TURP (TRANSURETHRAL RESECTION OF PROSTATE N/A 09/17/2019   Procedure: GREEN LIGHT  LASER TURP (TRANSURETHRAL RESECTION OF PROSTATE;  Surgeon: Kassie Ozell SAUNDERS, MD;  Location: [facility] OR[facility];  Service: Urology;  Laterality: N/A;   HIP ARTHROPLASTY Left 04/22/2023   Procedure: HEMIARTHROPLASTY (BIPOLAR) HIP, POSTERIOR APPROACH FOR FRACTURE;  Surgeon: Edie Norleen PARAS, MD;  Location: [facility] OR[facility];  Service: Orthopedics;  Laterality: Left;   PACEMAKER PLACEMENT  2006   HPI:  83 y.o male with significant PMH of permanent Atrial Fibrillation, SSS s/p pacemaker, Parkinson's Disease, SDH, glaucoma, Campylobacter enteritis, right heel pressure ulcer who presented to the ED in respiratory distress and hypoxia after a choking event on hotdog.    Assessment / Plan / Recommendation  Clinical Impression  Pt seen for bedside swallow evaluation s/p choking event resulting in intubation. Pt intubated 9/21-9/22, now on room air. History of dysphagia noted, with MBSS completed in 2017. Results indicated a moderate dysphagia related to pharyngeal residue- no penetration/aspiration during study. RN reporting medications administered over night crushed in puree without issue.   Today, pt with weak, congested cough. Suction set up in room, but has not been needed this date. Pt O2 saturations maintained at 95 and greater despite copious coughing. Oral motor function grossly intact. Trials completed of ice chips with immediate/delayed cough with significantly prolonged recovery. Pt reporting feel of phlegm in throat and inability to clear from system. Puree trials with similar cough cycle. Min increased manipulation/oral clearance noted.   Despite only brief intubation, given baseline risk factors (hx of dysphagia, Parkinson's and SDH), admission for acute hypoxia related to choking on food, weak cough, and current resp status- pt at increased for aspiration. Suspect that additional time  for recovery will aid overall stability/respiratory recovery. Recommend NPO with meds crushed in puree. Ice chips allowed following  oral care, with pauses with onset of coughing. MD and RN aware of plan. SLP will continue to follow.   SLP Visit Diagnosis: Dysphagia, unspecified (R13.10) (impacted by acute deconditioning, baseline parkninson's disease)    Aspiration Risk  Moderate aspiration risk    Diet Recommendation   NPO  Medication Administration: Crushed with puree    Other  Recommendations Oral Care Recommendations: Oral care QID;Staff/trained caregiver to provide oral care     Assistance Recommended at Discharge    Functional Status Assessment Patient has had a recent decline in their functional status and demonstrates the ability to make significant improvements in function in a reasonable and predictable amount of time.  Frequency and Duration min 2x/week  2 weeks       Prognosis Prognosis for improved oropharyngeal function: Good Barriers to Reach Goals:  (concern for baseline deficits)      Swallow Study   General Date of Onset: 10/15/23 HPI: 83 y.o male with significant PMH of permanent Atrial Fibrillation, SSS s/p pacemaker, Parkinson's Disease, SDH, glaucoma, Campylobacter enteritis, right heel pressure ulcer who presented to the ED in respiratory distress and hypoxia after a choking event on hotdog. Type of Study: Bedside Swallow Evaluation Previous Swallow Assessment: remote SLP intervention in 2017, 2019 Diet Prior to this Study: NPO Temperature Spikes Noted: No Respiratory Status: Room air History of Recent Intubation: Yes Total duration of intubation (days): 1 days (9/21-9/22) Date extubated: 10/14/23 Behavior/Cognition: Alert;Confused;Pleasant mood Oral Cavity Assessment: Within Functional Limits Oral Care Completed by SLP: Yes Oral Cavity - Dentition: Adequate natural dentition Vision:  (not assessed) Self-Feeding Abilities: Total assist Patient Positioning: Upright in bed Baseline Vocal Quality: Wet;Low vocal intensity Volitional Cough: Congested;Weak Volitional Swallow: Unable  to elicit    Oral/Motor/Sensory Function Overall Oral Motor/Sensory Function: Within functional limits   Ice Chips Ice chips: Impaired Presentation: Spoon Oral Phase Impairments: Impaired mastication (min increased time) Oral Phase Functional Implications: Prolonged oral transit (min increased) Pharyngeal Phase Impairments: Wet Vocal Quality;Cough - Immediate;Cough - Delayed   Thin Liquid Thin Liquid: Not tested    Nectar Thick Nectar Thick Liquid: Not tested   Honey Thick Honey Thick Liquid: Not tested   Puree Puree: Impaired Presentation: Spoon Oral Phase Impairments:  (intact) Oral Phase Functional Implications: Prolonged oral transit (min) Pharyngeal Phase Impairments: Cough - Delayed (pt reports continual feeling of congestion)   Solid     Solid: Not tested     [place] Wei Newbrough Clapp, MS, CCC-SLP Speech Language Pathologist Rehab Services; [facility] [place] [phone] (ascom)   [place] J Clapp 10/15/2023,11:11 AM

## 2023-10-16 DIAGNOSIS — L8990 Pressure ulcer of unspecified site, unspecified stage: Secondary | ICD-10-CM | POA: Insufficient documentation

## 2023-10-16 DIAGNOSIS — J9601 Acute respiratory failure with hypoxia: Secondary | ICD-10-CM | POA: Diagnosis not present

## 2023-10-16 LAB — PHOSPHORUS: Phosphorus: 2.6 mg/dL (ref 2.5–4.6)

## 2023-10-16 LAB — CBC
HCT: 39 % (ref 39.0–52.0)
Hemoglobin: 13.4 g/dL (ref 13.0–17.0)
MCH: 29.6 pg (ref 26.0–34.0)
MCHC: 34.4 g/dL (ref 30.0–36.0)
MCV: 86.1 fL (ref 80.0–100.0)
Platelets: 232 K/uL (ref 150–400)
RBC: 4.53 MIL/uL (ref 4.22–5.81)
RDW: 14.8 % (ref 11.5–15.5)
WBC: 8.6 K/uL (ref 4.0–10.5)
nRBC: 0 % (ref 0.0–0.2)

## 2023-10-16 LAB — BASIC METABOLIC PANEL WITH GFR
Anion gap: 10 (ref 5–15)
BUN: 26 mg/dL — ABNORMAL HIGH (ref 8–23)
CO2: 23 mmol/L (ref 22–32)
Calcium: 8.9 mg/dL (ref 8.9–10.3)
Chloride: 107 mmol/L (ref 98–111)
Creatinine, Ser: 0.79 mg/dL (ref 0.61–1.24)
GFR, Estimated: >60 mL/min (ref >60)
Glucose, Bld: 106 mg/dL — ABNORMAL HIGH (ref 70–99)
Potassium: 3.7 mmol/L (ref 3.5–5.1)
Sodium: 140 mmol/L (ref 135–145)

## 2023-10-16 LAB — MAGNESIUM: Magnesium: 2 mg/dL (ref 1.7–2.4)

## 2023-10-16 LAB — GLUCOSE, CAPILLARY
Glucose-Capillary: 101 mg/dL — ABNORMAL HIGH (ref 70–99)
Glucose-Capillary: 110 mg/dL — ABNORMAL HIGH (ref 70–99)
Glucose-Capillary: 125 mg/dL — ABNORMAL HIGH (ref 70–99)
Glucose-Capillary: 129 mg/dL — ABNORMAL HIGH (ref 70–99)
Glucose-Capillary: 139 mg/dL — ABNORMAL HIGH (ref 70–99)
Glucose-Capillary: 97 mg/dL (ref 70–99)

## 2023-10-16 MED ORDER — INFLUENZA VAC SPLIT HIGH-DOSE 0.5 ML IM SUSY
0.5000 mL | PREFILLED_SYRINGE | Freq: Once | INTRAMUSCULAR | Status: AC
Start: 2023-10-16 — End: 2023-10-16
  Administered 2023-10-16: 0.5 mL via INTRAMUSCULAR
  Filled 2023-10-16: qty 0.5

## 2023-10-16 MED ORDER — DEXTROSE-SODIUM CHLORIDE 5-0.45 % IV SOLN: INTRAVENOUS | Status: DC

## 2023-10-16 NOTE — Plan of Care (Signed)
  Problem: Education: Goal: Ability to describe self-care measures that may prevent or decrease complications (Diabetes Survival Skills Education) will improve Outcome: Progressing Goal: Individualized Educational Video(s) Outcome: Progressing   Problem: Coping: Goal: Ability to adjust to condition or change in health will improve Outcome: Progressing   Problem: Fluid Volume: Goal: Ability to maintain a balanced intake and output will improve Outcome: Progressing   Problem: Health Behavior/Discharge Planning: Goal: Ability to identify and utilize available resources and services will improve Outcome: Progressing Goal: Ability to manage health-related needs will improve Outcome: Progressing   Problem: Nutritional: Goal: Maintenance of adequate nutrition will improve Outcome: Progressing Goal: Progress toward achieving an optimal weight will improve Outcome: Progressing   Problem: Skin Integrity: Goal: Risk for impaired skin integrity will decrease Outcome: Progressing

## 2023-10-16 NOTE — Plan of Care (Signed)
  Problem: Metabolic: Goal: Ability to maintain appropriate glucose levels will improve Outcome: Progressing   Problem: Tissue Perfusion: Goal: Adequacy of tissue perfusion will improve Outcome: Progressing   Problem: Clinical Measurements: Goal: Ability to maintain clinical measurements within normal limits will improve Outcome: Progressing   Problem: Safety: Goal: Ability to remain free from injury will improve Outcome: Progressing   Problem: Pain Managment: Goal: General experience of comfort will improve and/or be controlled Outcome: Progressing

## 2023-10-16 NOTE — Progress Notes (Signed)
 PROGRESS NOTE    Daniel [name]  FMW:[id number] DOB: 08-17-1940 DOA: 10/13/2023 PCP: Valora Agent, MD  Outpatient Specialists: neurology    Brief Narrative:   83 y.o male with significant PMH of permanent Atrial Fibrillation, SSS s/p pacemaker, Parkinson's Disease, SDH, glaucoma, Campylobacter enteritis, right heel pressure ulcer who presented to the ED in respiratory distress and hypoxia after a choking event on hotdog.   ED Course: Initial vital signs showed HR of 155 beats/minute, BP 162/96 mm Hg, the RR 24 breaths/minute, and the oxygen saturation 100% on NRB and a temperature of 97.60F (36.3C).    Pertinent Labs/Diagnostics Findings: Na+/ K+: 137/3.4.  Glucose: 77 BUN/Cr.:  39/1.09  AST/ALT: 51/14 Unremarkable CBC Lactic acid:  UA positive   Patient was intubated for airway protection and PCCM consulted for STAT Bronchoscopy. Flexible Bronchoscopy was performed at the bediside for foreign body extraction with findings of foreign body in the RLL bronchus, extracted through a roth net retrieval device. Patient transferred to the ICU post procedure.   Assessment & Plan:   Principal Problem:   Acute hypoxic respiratory failure (HCC) Active Problems:   Parkinson's disease (HCC)   Pressure injury of skin   Atrial fibrillation (HCC)  # Aspiration Of hot dog, s/p extraction from bronchus intermedius by pulm. Now breathing comfortably on room air, s/p intubation - monitor  # Dysphagia Chronic problem 2/2 parkinson's - slp following, not yet cleared to swallow - start fluids  # Hospital delirium Wife says normally alert and oriented at home but is susceptible to hospital delirium, happens every time he's admitted. - delirium precautions  # Parkinson's - home sinemet   # Bacteriuria Culture growing e coli but per wife no sympotms - hold ceftriaxone   # Sick sinus S/p pacer  # Debility Pt/ot advising snf but wife declines, she has aides at home - home health  pt/ot at d/c  # A-fib Not anticoagulated, BB has been discontinued by cards   DVT prophylaxis: heparin  Code Status: full Family Communication: wife updated telephonically 9/24  Level of care: Med-Surg Status is: Inpatient Remains inpatient appropriate because: severity of illness    Consultants:  none  Procedures: S/p bronchoscopy, intubation  Antimicrobials:  S/p ceftriaxone     Subjective: confused  Objective: Vitals:   10/15/23 1956 10/16/23 0430 10/16/23 0500 10/16/23 0737  BP: 133/64 125/73  (!) 143/84  Pulse: 78 84  93  Resp: 16 17  17   Temp: 97.8 F (36.6 C) 98.2 F (36.8 C)  98.4 F (36.9 C)  TempSrc:  Oral    SpO2: 100% 99%  98%  Weight:   65.6 kg   Height:        Intake/Output Summary (Last 24 hours) at 10/16/2023 1118 Last data filed at 10/16/2023 0505 Gross per 24 hour  Intake 501.15 ml  Output 700 ml  Net -198.85 ml   Filed Weights   10/14/23 0615 10/15/23 0500 10/16/23 0500  Weight: 69 kg 68.6 kg 65.6 kg    Examination:  General exam: Appears calm and comfortable, disheveled Respiratory system: Clear to auscultation. Respiratory effort normal. Cardiovascular system: S1 & S2 heard, RRR.   Gastrointestinal system: Abdomen is nondistended, soft and nontender.  Central nervous system: Alert , moving all 4, confused Extremities: warm, no edema Skin: No rashes, lesions or ulcers Psychiatry: confused    Data Reviewed: I have personally reviewed following labs and imaging studies  CBC: Recent Labs  Lab 10/13/23 1828 10/14/23 0302 10/15/23 0453 10/16/23 0522  WBC 10.1 14.1*  9.7 8.6  HGB 14.6 12.7* 12.0* 13.4  HCT 44.0 38.5* 36.1* 39.0  MCV 88.0 89.1 87.6 86.1  PLT 279 208 215 232   Basic Metabolic Panel: Recent Labs  Lab 10/13/23 1828 10/14/23 0302 10/15/23 0453 10/16/23 0522  NA 137 134* 139 140  K 3.4* 4.1 3.6 3.7  CL 104 105 105 107  CO2 18* 22 25 23   GLUCOSE 177* 131* 100* 106*  BUN 39* 35* 30* 26*  CREATININE  1.09 0.86 0.95 0.79  CALCIUM 9.2 8.6* 8.8* 8.9  MG  --  2.0 1.9 2.0  PHOS  --  3.0 2.9 2.6   GFR: Estimated Creatinine Clearance: 66.1 mL/min (by C-G formula based on SCr of 0.79 mg/dL). Liver Function Tests: Recent Labs  Lab 10/13/23 1828  AST 51*  ALT 14  ALKPHOS 93  BILITOT 0.9  PROT 7.5  ALBUMIN 3.9   No results for input(s): LIPASE, AMYLASE in the last 168 hours. No results for input(s): AMMONIA in the last 168 hours. Coagulation Profile: Recent Labs  Lab 10/13/23 1828  INR 1.1   Cardiac Enzymes: No results for input(s): CKTOTAL, CKMB, CKMBINDEX, TROPONINI in the last 168 hours. BNP (last 3 results) No results for input(s): PROBNP in the last 8760 hours. HbA1C: No results for input(s): HGBA1C in the last 72 hours. CBG: Recent Labs  Lab 10/15/23 1635 10/15/23 1956 10/16/23 0022 10/16/23 0432 10/16/23 0734  GLUCAP 96 88 125* 97 101*   Lipid Profile: No results for input(s): CHOL, HDL, LDLCALC, TRIG, CHOLHDL, LDLDIRECT in the last 72 hours. Thyroid  Function Tests: Recent Labs    10/13/23 1815  TSH 6.668*  FREET4 0.87   Anemia Panel: No results for input(s): VITAMINB12, FOLATE, FERRITIN, TIBC, IRON, RETICCTPCT in the last 72 hours. Urine analysis:    Component Value Date/Time   COLORURINE YELLOW (A) 10/13/2023 1830   APPEARANCEUR CLOUDY (A) 10/13/2023 1830   LABSPEC 1.017 10/13/2023 1830   PHURINE 5.0 10/13/2023 1830   GLUCOSEU NEGATIVE 10/13/2023 1830   HGBUR MODERATE (A) 10/13/2023 1830   BILIRUBINUR NEGATIVE 10/13/2023 1830   KETONESUR 5 (A) 10/13/2023 1830   PROTEINUR 30 (A) 10/13/2023 1830   NITRITE POSITIVE (A) 10/13/2023 1830   LEUKOCYTESUR LARGE (A) 10/13/2023 1830   Sepsis Labs: @LABRCNTIP (procalcitonin:4,lacticidven:4)  ) Recent Results (from the past 240 hours)  Urine Culture     Status: Abnormal   Collection Time: 10/13/23  6:30 PM   Specimen: Urine, Random  Result Value Ref Range Status    Specimen Description   Final    URINE, RANDOM Performed at [facility], [address] [place], [place] [address]    Special Requests   Final    NONE Reflexed from [phone] Performed at [facility], [address] [place], [place] [address]    Culture >=100,000 COLONIES/mL ESCHERICHIA COLI (A)  Final   Report Status 10/15/2023 FINAL  Final   Organism ID, Bacteria ESCHERICHIA COLI (A)  Final      Susceptibility   Escherichia coli - MIC*    AMPICILLIN >=32 RESISTANT Resistant     CEFAZOLIN  (URINE) Value in next row Sensitive      4 SENSITIVEThis is a modified FDA-approved test that has been validated and its performance characteristics determined by the reporting laboratory.  This laboratory is certified under the Clinical Laboratory Improvement Amendments CLIA as qualified to perform high complexity clinical laboratory testing.    CEFEPIME Value in next row Sensitive      4 SENSITIVEThis  is a modified FDA-approved test that has been validated and its performance characteristics determined by the reporting laboratory.  This laboratory is certified under the Clinical Laboratory Improvement Amendments CLIA as qualified to perform high complexity clinical laboratory testing.    ERTAPENEM Value in next row Sensitive      4 SENSITIVEThis is a modified FDA-approved test that has been validated and its performance characteristics determined by the reporting laboratory.  This laboratory is certified under the Clinical Laboratory Improvement Amendments CLIA as qualified to perform high complexity clinical laboratory testing.    CEFTRIAXONE  Value in next row Sensitive      4 SENSITIVEThis is a modified FDA-approved test that has been validated and its performance characteristics determined by the reporting laboratory.  This laboratory is certified under the Clinical Laboratory Improvement Amendments CLIA as qualified to perform high complexity clinical laboratory testing.     CIPROFLOXACIN Value in next row Sensitive      4 SENSITIVEThis is a modified FDA-approved test that has been validated and its performance characteristics determined by the reporting laboratory.  This laboratory is certified under the Clinical Laboratory Improvement Amendments CLIA as qualified to perform high complexity clinical laboratory testing.    GENTAMICIN  Value in next row Sensitive      4 SENSITIVEThis is a modified FDA-approved test that has been validated and its performance characteristics determined by the reporting laboratory.  This laboratory is certified under the Clinical Laboratory Improvement Amendments CLIA as qualified to perform high complexity clinical laboratory testing.    NITROFURANTOIN Value in next row Sensitive      4 SENSITIVEThis is a modified FDA-approved test that has been validated and its performance characteristics determined by the reporting laboratory.  This laboratory is certified under the Clinical Laboratory Improvement Amendments CLIA as qualified to perform high complexity clinical laboratory testing.    TRIMETH /SULFA  Value in next row Sensitive      4 SENSITIVEThis is a modified FDA-approved test that has been validated and its performance characteristics determined by the reporting laboratory.  This laboratory is certified under the Clinical Laboratory Improvement Amendments CLIA as qualified to perform high complexity clinical laboratory testing.    AMPICILLIN/SULBACTAM Value in next row Intermediate      4 SENSITIVEThis is a modified FDA-approved test that has been validated and its performance characteristics determined by the reporting laboratory.  This laboratory is certified under the Clinical Laboratory Improvement Amendments CLIA as qualified to perform high complexity clinical laboratory testing.    PIP/TAZO Value in next row Sensitive      <=4 SENSITIVEThis is a modified FDA-approved test that has been validated and its performance characteristics  determined by the reporting laboratory.  This laboratory is certified under the Clinical Laboratory Improvement Amendments CLIA as qualified to perform high complexity clinical laboratory testing.    MEROPENEM Value in next row Sensitive      <=4 SENSITIVEThis is a modified FDA-approved test that has been validated and its performance characteristics determined by the reporting laboratory.  This laboratory is certified under the Clinical Laboratory Improvement Amendments CLIA as qualified to perform high complexity clinical laboratory testing.    * >=100,000 COLONIES/mL ESCHERICHIA COLI  MRSA Next Gen by PCR, Nasal     Status: None   Collection Time: 10/13/23  8:00 PM   Specimen: Nasal Mucosa; Nasal Swab  Result Value Ref Range Status   MRSA by PCR Next Gen NOT DETECTED NOT DETECTED Final    Comment: (NOTE) The GeneXpert MRSA Assay (FDA  approved for NASAL specimens only), is one component of a comprehensive MRSA colonization surveillance program. It is not intended to diagnose MRSA infection nor to guide or monitor treatment for MRSA infections. Test performance is not FDA approved in patients less than 89 years old. Performed at [facility], [address] [place], [place] [address]          Radiology Studies: No results found.      Scheduled Meds:  carbidopa -levodopa   1 tablet Oral QHS   carbidopa -levodopa   2 tablet Oral TID with meals   Chlorhexidine  Gluconate Cloth  6 each Topical QHS   heparin  injection (subcutaneous)  5,000 Units Subcutaneous Q8H   insulin  aspart  0-6 Units Subcutaneous Q4H   mouth rinse  15 mL Mouth Rinse QID   Continuous Infusions:  cefTRIAXone  (ROCEPHIN )  IV Stopped (10/16/23 9185)     LOS: 3 days     Devaughn KATHEE Ban, MD [facility]   If 7PM-7AM, please contact night-coverage [email or web address] Password [email or web address] 10/16/2023, 11:18 AM

## 2023-10-16 NOTE — Care Management Important Message (Signed)
 Important Message  Patient Details  Name: Daniel [name] MRN: [id number] Date of Birth: [birth year]   Important Message Given:  Yes - Medicare IM     Devann Cribb W, CMA 10/16/2023, 1:14 PM

## 2023-10-16 NOTE — Progress Notes (Signed)
 Speech Language Pathology Treatment: Dysphagia  Patient Details Name: Daniel [name] MRN: [id number] DOB: 07-25-1940 Today's Date: 10/16/2023 Time: 1410-1430 SLP Time Calculation (min) (ACUTE ONLY): 20 min  Assessment / Plan / Recommendation Clinical Impression  Pt seen for ongoing dysphagia management. Pt's wife was present during this treatment session. In addition to the MBSS (2017) below pt reports recent dysphagia treatment with study during recovery at [facility] (May 2025). Pt's wife reports coughing at home during consumption but that he had been managing well.  During this treatment, pt with open mouth posture during rest, resonance impairment during speech and immediate coughing when consuming ice chips and thin liquids via spoon. Pt's wife reported that nature of cough is consistent with cough that is present at home. Coughing was reduced with honey thick liquids via spoon and puree but was not eliminated. Pt with observed multiple swallows with honey thick liquids and puree. Recommend instrumental swallow study to assess for any potential deficits in strength and/or timing of swallow. Education provided to pt and his wife. Both voiced understanding and agreement with current plan. Education provided to pt's nurse of providing honey thick liquids via spoon with his medicine crushed in puree.   MBSS scheduled for 10/16/2023.    HPI HPI: 83 y.o male with significant PMH of permanent Atrial Fibrillation, SSS s/p pacemaker, Parkinson's Disease, SDH, glaucoma, Campylobacter enteritis, right heel pressure ulcer who presented to the ED in respiratory distress and hypoxia after a choking event on hotdog.  Pt intubated 9/21-9/22, now on room air. History of dysphagia noted, with MBSS completed in 2017. Results indicated a moderate dysphagia related to pharyngeal residue- no penetration/aspiration during study.       SLP Plan  MBS          Recommendations  Diet recommendations: NPO  (medicine crushed in puree with honey thick liquids via spoon) Medication Administration: Crushed with puree Supervision: Full supervision/cueing for compensatory strategies;Staff to assist with self feeding Compensations: Minimize environmental distractions;Slow rate;Small sips/bites Postural Changes and/or Swallow Maneuvers: Seated upright 90 degrees;Upright 30-60 min after meal                  Oral care QID;Staff/trained caregiver to provide oral care   Frequent or constant Supervision/Assistance Dysphagia, unspecified (R13.10)     MBS    Marks Scalera B. Rubbie, M.S., CCC-SLP, Tree surgeon Certified Brain Injury Specialist [facility]  [facility] Office [phone] Ascom [phone] Fax [phone]

## 2023-10-17 ENCOUNTER — Inpatient Hospital Stay

## 2023-10-17 DIAGNOSIS — Z7189 Other specified counseling: Secondary | ICD-10-CM | POA: Diagnosis not present

## 2023-10-17 DIAGNOSIS — J9601 Acute respiratory failure with hypoxia: Secondary | ICD-10-CM | POA: Diagnosis not present

## 2023-10-17 LAB — GLUCOSE, CAPILLARY
Glucose-Capillary: 100 mg/dL — ABNORMAL HIGH (ref 70–99)
Glucose-Capillary: 103 mg/dL — ABNORMAL HIGH (ref 70–99)
Glucose-Capillary: 108 mg/dL — ABNORMAL HIGH (ref 70–99)
Glucose-Capillary: 98 mg/dL (ref 70–99)

## 2023-10-17 LAB — BASIC METABOLIC PANEL WITH GFR
Anion gap: 11 (ref 5–15)
BUN: 23 mg/dL (ref 8–23)
CO2: 23 mmol/L (ref 22–32)
Calcium: 9 mg/dL (ref 8.9–10.3)
Chloride: 106 mmol/L (ref 98–111)
Creatinine, Ser: 0.8 mg/dL (ref 0.61–1.24)
GFR, Estimated: >60 mL/min (ref >60)
Glucose, Bld: 106 mg/dL — ABNORMAL HIGH (ref 70–99)
Potassium: 3.6 mmol/L (ref 3.5–5.1)
Sodium: 140 mmol/L (ref 135–145)

## 2023-10-17 NOTE — Progress Notes (Signed)
 Physical Therapy Treatment Patient Details Name: ALESSANDRO GRIEP MRN: [id number] DOB: 01-27-1940 Today's Date: 10/17/2023   History of Present Illness Patient is a 83 year old male presenting with respiratory distress and hypoxia after a choking event on hotdog requiring intubation. Now extubated. PMH: SSS s/p pacemaker, Parkinson's Disease, SDH, glaucoma, Campylobacter enteritis, right heel pressure.    PT Comments  Pt was supine in bed upon arrival. He is alert and mostly oriented however does have cognition deficits. No family present to confirm baseline cognition. Pt is pleasantly confused and requires increased time and cueing for all desired task. Author questions pt's insight however he remains cooperative throughout session. Pt was able to exit L sid eof bed with increased time and extensive +1 assistance. He tolerated standing 3 x EOB however poor standing posture and poor standing tolerance observed. He required constant assistance and vcing for posture correct. On 3rd STS attempt. Pt has BM. Author assisted with hygiene care prior to conclusion of session. He was repositioned in bed with bed alarm set and call bell in reach. DC recs remain appropriate to maximize independence and safety with all ADLs while decreasing caregiver burden.    If plan is discharge home, recommend the following: A lot of help with walking and/or transfers;A lot of help with bathing/dressing/bathroom;Assist for transportation;Help with stairs or ramp for entrance;Assistance with cooking/housework     Equipment Recommendations  Other (comment) (Defer to next level of care)       Precautions / Restrictions Precautions Precautions: Fall Recall of Precautions/Restrictions: Impaired Restrictions Weight Bearing Restrictions Per Provider Order: No     Mobility  Bed Mobility Overal bed mobility: Needs Assistance Bed Mobility: Supine to Sit, Sit to Supine  Supine to sit: Mod assist, Used rails Sit to supine: Min  assist, Mod assist General bed mobility comments: Pt was able to exit L side of bed with much less assistance today. He continues to require asisstance and increased time to perform desired task.    Transfers Overall transfer level: Needs assistance Equipment used: Rolling walker (2 wheels) Transfers: Sit to/from Stand Sit to Stand: Max assist, From elevated surface  General transfer comment: Pt stood EOB 3 x from elevated bed height. max assist and vcs fro stand for ~ 10 sec each attempt. poor standing posture throughout. Pt needs constant assist in standing to prevent falling. Pt has BM on third time STS. Max assist for hygiene care afterwards    Ambulation/Gait  General Gait Details: not attempted due to poor standing tolerance and generalized weakness    Balance Overall balance assessment: Needs assistance Sitting-balance support: Feet supported Sitting balance-Leahy Scale: Poor     Standing balance support: Bilateral upper extremity supported, During functional activity, Reliant on assistive device for balance Standing balance-Leahy Scale: Poor Standing balance comment: Pt remains exteremely high fall risk         Communication Communication Communication: Impaired Factors Affecting Communication: Reduced clarity of speech  Cognition Arousal: Alert Behavior During Therapy: WFL for tasks assessed/performed   PT - Cognitive impairments: No family/caregiver present to determine baseline Difficult to assess due to: Impaired communication    PT - Cognition Comments: Pt is A and cooperative. He knee he was at [facility] however does have some congition deficits come to light throughout session. pleasantly confused but extremely cooperative Following commands: Intact Following commands impaired: Follows one step commands with increased time    Cueing Cueing Techniques: Verbal cues, Tactile cues, Visual cues  Pertinent Vitals/Pain Pain Assessment Pain Assessment:  0-10 Pain Score: 3  Pain Location: R knee Pain Descriptors / Indicators: Discomfort Pain Intervention(s): Limited activity within patient's tolerance, Monitored during session, Premedicated before session, Repositioned     PT Goals (current goals can now be found in the care plan section) Acute Rehab PT Goals Patient Stated Goal: none stated Progress towards PT goals: Not progressing toward goals - comment    Frequency    Min 2X/week           Co-evaluation     PT goals addressed during session: Mobility/safety with mobility;Balance;Proper use of DME;Strengthening/ROM        AM-PAC PT 6 Clicks Mobility   Outcome Measure  Help needed turning from your back to your side while in a flat bed without using bedrails?: A Lot Help needed moving from lying on your back to sitting on the side of a flat bed without using bedrails?: A Lot Help needed moving to and from a bed to a chair (including a wheelchair)?: A Lot Help needed standing up from a chair using your arms (e.g., wheelchair or bedside chair)?: A Lot Help needed to walk in hospital room?: Total Help needed climbing 3-5 steps with a railing? : Total 6 Click Score: 10    End of Session   Activity Tolerance: Patient tolerated treatment well;Patient limited by fatigue Patient left: in bed;with call bell/phone within reach;with bed alarm set;with SCD's reapplied Nurse Communication: Mobility status PT Visit Diagnosis: Muscle weakness (generalized) (M62.81);Unsteadiness on feet (R26.81)     Time: 9159-9140 PT Time Calculation (min) (ACUTE ONLY): 19 min  Charges:    $Therapeutic Activity: 8-22 mins PT General Charges $$ ACUTE PT VISIT: 1 Visit                    Rankin Essex PTA 10/17/23, 11:02 AM

## 2023-10-17 NOTE — Plan of Care (Signed)
  Problem: Education: Goal: Ability to describe self-care measures that may prevent or decrease complications (Diabetes Survival Skills Education) will improve 10/17/2023 1129 by Kateri Caprice HERO, RN Outcome: Progressing 10/17/2023 1128 by Kateri Caprice HERO, RN Outcome: Progressing   Problem: Coping: Goal: Ability to adjust to condition or change in health will improve 10/17/2023 1129 by Kateri Caprice HERO, RN Outcome: Progressing 10/17/2023 1128 by Kateri Caprice HERO, RN Outcome: Progressing   Problem: Fluid Volume: Goal: Ability to maintain a balanced intake and output will improve 10/17/2023 1129 by Kateri Caprice HERO, RN Outcome: Progressing 10/17/2023 1128 by Kateri Caprice HERO, RN Outcome: Progressing   Problem: Health Behavior/Discharge Planning: Goal: Ability to identify and utilize available resources and services will improve 10/17/2023 1129 by Kateri Caprice HERO, RN Outcome: Progressing 10/17/2023 1128 by Kateri Caprice HERO, RN Outcome: Progressing   Problem: Metabolic: Goal: Ability to maintain appropriate glucose levels will improve 10/17/2023 1129 by Kateri Caprice HERO, RN Outcome: Progressing 10/17/2023 1128 by Kateri Caprice HERO, RN Outcome: Progressing   Problem: Nutritional: Goal: Maintenance of adequate nutrition will improve Outcome: Progressing   Problem: Skin Integrity: Goal: Risk for impaired skin integrity will decrease 10/17/2023 1129 by Kateri Caprice HERO, RN Outcome: Progressing 10/17/2023 1128 by Kateri Caprice HERO, RN Outcome: Progressing   Problem: Tissue Perfusion: Goal: Adequacy of tissue perfusion will improve 10/17/2023 1129 by Kateri Caprice HERO, RN Outcome: Progressing 10/17/2023 1128 by Kateri Caprice HERO, RN Outcome: Progressing   Problem: Education: Goal: Knowledge of General Education information will improve Description: Including pain rating scale, medication(s)/side effects and non-pharmacologic comfort measures Outcome: Progressing   Problem: Activity: Goal: Risk for activity  intolerance will decrease Outcome: Progressing   Problem: Nutrition: Goal: Adequate nutrition will be maintained Outcome: Progressing   Problem: Coping: Goal: Level of anxiety will decrease 10/17/2023 1129 by Kateri Caprice HERO, RN Outcome: Progressing 10/17/2023 1128 by Kateri Caprice HERO, RN Outcome: Progressing   Problem: Elimination: Goal: Will not experience complications related to bowel motility Outcome: Progressing   Problem: Pain Managment: Goal: General experience of comfort will improve and/or be controlled 10/17/2023 1129 by Kateri Caprice HERO, RN Outcome: Progressing 10/17/2023 1128 by Kateri Caprice HERO, RN Outcome: Progressing   Problem: Safety: Goal: Ability to remain free from injury will improve 10/17/2023 1129 by Kateri Caprice HERO, RN Outcome: Progressing 10/17/2023 1128 by Kateri Caprice HERO, RN Outcome: Progressing   Problem: Skin Integrity: Goal: Risk for impaired skin integrity will decrease 10/17/2023 1129 by Kateri Caprice HERO, RN Outcome: Progressing 10/17/2023 1128 by Kateri Caprice HERO, RN Outcome: Progressing

## 2023-10-17 NOTE — Consult Note (Signed)
 Consultation Note Date: 10/17/2023   Patient Name: Daniel [name]  DOB: [birth year]  MRN: [id number]  Age / Sex: 83 y.o., male  PCP: Valora Agent, MD Referring Physician: Kandis Devaughn Sayres, MD  Reason for Consultation: Establishing goals of care  HPI/Patient Profile: 83 y.o male with significant PMH of permanent Atrial Fibrillation, SSS s/p pacemaker, Parkinson's Disease, SDH, glaucoma, Campylobacter enteritis, right heel pressure ulcer who presented to the ED in respiratory distress and hypoxia after a choking event on hotdog.    Clinical Assessment and Goals of Care: Notes and labs reviewed.  In to see patient.  He is currently resting in bed at this time and is alert but does not speak.  He has a caregiver at bedside.  She discusses that patient had been demonstrating coughing with oral intake at times, but no overt choking.  Patient's wife entered into room.  She discusses that she is only seeing him have coughing with eating and drinking.  She states at baseline typically he eats soft foods which consist of things like cream potatoes, oatmeal, yogurt, and eggs.  She states usually he does not eat hot dogs or things of that consistency.  She states at home after eating the hot dog, a person there performed the Heimlich maneuver to remove the hot dog.  She discusses that after coming to the ED further remnants were removed.  She discusses initial recommendation for a feeding tube placement, but further conversation that placing a feeding tube could lead to infection and other issues and is not advisable.  She states she is confused by this.  Discussed meeting tomorrow morning to talk further regarding plans..    SUMMARY OF RECOMMENDATIONS   PMT will follow-up in the morning.       Primary Diagnoses: Present on Admission:  Acute hypoxic respiratory failure (HCC)  Parkinson's disease (HCC)   Atrial fibrillation (HCC)   I have reviewed the medical record, interviewed the patient and family, and examined the patient. The following aspects are pertinent.  Past Medical History:  Diagnosis Date   AF (atrial fibrillation) (HCC)    B12 deficiency    Coronary artery disease    DH (dermatitis herpetiformis)    Dysphagia    Hypertension    Presence of permanent cardiac pacemaker    2006   Primary parkinsonism [facility])    Prostate cancer (HCC) 2014   Social History   Socioeconomic History   Marital status: Married    Spouse name: Not on file   Number of children: Not on file   Years of education: Not on file   Highest education level: Not on file  Occupational History   Not on file  Tobacco Use   Smoking status: Never   Smokeless tobacco: Never  Vaping Use   Vaping status: Never Used  Substance and Sexual Activity   Alcohol  use: Not Currently    Comment: RARELY   Drug use: No   Sexual activity: Not on file  Other Topics Concern   Not on  file  Social History Narrative   Not on file   Social Drivers of Health   Financial Resource Strain: Low Risk  (08/02/2023)   Received from [facility]   Overall Financial Resource Strain (CARDIA)    Difficulty of Paying Living Expenses: Not very hard  Food Insecurity: No Food Insecurity (10/13/2023)   Hunger Vital Sign    Worried About Running Out of Food in the Last Year: Never true    Ran Out of Food in the Last Year: Never true  Transportation Needs: No Transportation Needs (10/13/2023)   PRAPARE - Administrator, Civil Service (Medical): No    Lack of Transportation (Non-Medical): No  Physical Activity: Not on file  Stress: Not on file  Social Connections: Socially Integrated (10/13/2023)   Social Connection and Isolation Panel    Frequency of Communication with Friends and Family: Three times a week    Frequency of Social Gatherings with Friends and Family: Twice a week    Attends Religious  Services: More than 4 times per year    Active Member of [employer] or Organizations: Yes    Attends Engineer, structural: More than 4 times per year    Marital Status: Married   Family History  Problem Relation Age of Onset   Heart attack Mother    Heart attack Father    Scheduled Meds:  carbidopa -levodopa   1 tablet Oral QHS   carbidopa -levodopa   2 tablet Oral TID with meals   Chlorhexidine  Gluconate Cloth  6 each Topical QHS   heparin  injection (subcutaneous)  5,000 Units Subcutaneous Q8H   mouth rinse  15 mL Mouth Rinse QID   Continuous Infusions: PRN Meds:.docusate sodium , mouth rinse, polyethylene glycol Medications Prior to Admission:  Prior to Admission medications   Medication Sig Start Date End Date Taking? Authorizing Provider  acetaminophen  (TYLENOL ) 325 MG tablet Take 1-2 tablets (325-650 mg total) by mouth every 6 (six) hours as needed for mild pain (pain score 1-3) (or temp > 100.5). 04/26/23  Yes Alexander, Natalie, DO  carbidopa -levodopa  (SINEMET  CR) 50-200 MG tablet Take 1 tablet by mouth at bedtime. 08/07/19  Yes [provider]  carbidopa -levodopa  (SINEMET  IR) 25-100 MG tablet Take 2 tablets by mouth with breakfast, with lunch, and with evening meal. 04/26/23  Yes Marsa Edelman, DO  cyanocobalamin  1000 MCG tablet Take 1,000 mcg by mouth daily.   Yes [provider]  dapsone  25 MG tablet Take 25 mg by mouth 3 (three) times a week. Patient not taking: Reported on 10/13/2023 12/17/22   [provider]   Allergies  Allergen Reactions   Clindamycin /Lincomycin Hives   Clindamycin  Hives   Doxycycline Rash   Hydrocodone -Acetaminophen  Rash   Levaquin [Levofloxacin] Rash   Review of Systems  Unable to perform ROS   Physical Exam Pulmonary:     Effort: Pulmonary effort is normal.  Neurological:     Mental Status: He is alert.     Vital Signs: BP 125/79 (BP Location: Left Arm)   Pulse 92   Temp 98.3 F (36.8 C)   Resp 16   Ht  6' 2 (1.88 m)   Wt 68.2 kg   SpO2 98%   BMI 19.30 kg/m  Pain Scale: 0-10   Pain Score: 0-No pain   SpO2: SpO2: 98 % O2 Device:SpO2: 98 % O2 Flow Rate: .O2 Flow Rate (L/min): 4 L/min  IO: Intake/output summary:  Intake/Output Summary (Last 24 hours) at 10/17/2023 1615 Last data filed  at 10/17/2023 9391 Gross per 24 hour  Intake 1074.94 ml  Output 700 ml  Net 374.94 ml    LBM: Last BM Date : 10/17/23 Baseline Weight: Weight: 74 kg Most recent weight: Weight: 68.2 kg       Signed by: Camelia Lewis, NP   Please contact Palliative Medicine Team phone at [phone] for questions and concerns.  For individual provider: See Tracey

## 2023-10-17 NOTE — Procedures (Signed)
 Modified Barium Swallow Study  Patient Details  Name: Daniel [name] MRN: [id number] Date of Birth: 01-21-1941  Today's Date: 10/17/2023  Modified Barium Swallow completed.  Full report located under Chart Review in the Imaging Section.  History of Present Illness 83 y.o male with significant PMH of permanent Atrial Fibrillation, SSS s/p pacemaker, Parkinson's Disease, SDH, glaucoma, Campylobacter enteritis, right heel pressure ulcer who presented to the ED in respiratory distress and hypoxia after a choking event on hotdog. In addition to the MBSS (2017) below pt reports recent dysphagia treatment with study during recovery at [facility] (May 2025). Pt's wife reports coughing at home during consumption but that he had been managing well.   Clinical Impression Pt presents with profound oropharyngeal dysphagia placing him at a high of aspiration and asphyxia with all PO intake.   Prior to initial of PO trials, pt with continued decreased vocal intensity, imprecise artication, flaccid appearing open mouth posture, hypernasal resonance resulting in < 25% speech intelligibility at word/phrase. Pt's cough is weak and nonproductive.       Pt presents with what is likely a chronic dysphagia.   During this study, all aspects of pt's swallow are profoundly impaired resulting in gross silent and sensed aspiration of nectar thick liquids and puree. Puree bolus was observed hanging in pharynx over pt's airway with no ability to clear residuals after ~ 10 cued swallows. While pt didn't exhibit any overt s/s of respiratory compromise or asphyxia, subjectively pt's eyes widened and reddened with extended attempts to swallow, cough or hock up the bolus. Extended time (5 minutes) and maximal cues required for pt to clear enough of the bolus to end fluro monitoring. Study discontinued d/t severe risk of medical compromise. At this time, pt is at a high risk of aspiration with all PO intake as well as his own  salvia. Recommend strict NPO with discussion regarding GOC.        Objective impairments were observed as follows:   Oral Phase   Poor lingual control, bolus preparation/transport resulting in passive spillage of boluses over base of tongue and significant oral residue with all consistencies prompting multiple swallows per each bolus   Pharyngeal Phase   Very little soft palate elevation likely resulting in reduced velopharyngeal pressure (positive pressure in pharynx)  Significantly reduced laryngeal elevation, anterior hyoid excursion,epiglottic movement, pharyngeal stripping wave, tongue base retraction and pharyngoesophageal segment opening resulting in entirety of bolus remaining in the pharynx.   Compensatory strategies were not effective in reducing pharyngeal residue or aspiration.  Factors that may increase risk of adverse event in presence of aspiration Noe & Lianne 2021): Poor general health and/or compromised immunity;Reduced cognitive function;Frail or deconditioned;Dependence for feeding and/or oral hygiene;Reduced saliva;Weak cough (recent aspiration of hotdog)  Swallow Evaluation Recommendations Recommendations: NPO Medication Administration: Via alternative means Oral care recommendations: Oral care QID (4x/day) Recommended consults: Consider Palliative care Caregiver Recommendations: Have oral suction available    Tajon Moring B. Rubbie, M.S., CCC-SLP, CBIS Speech-Language Pathologist Certified Brain Injury Specialist [facility]  [facility] Office [phone] Ascom [phone] Fax [phone]   Aberdeen Hafen Rubbie 10/17/2023,8:55 AM

## 2023-10-17 NOTE — Plan of Care (Signed)
  Problem: Education: Goal: Ability to describe self-care measures that may prevent or decrease complications (Diabetes Survival Skills Education) will improve Outcome: Progressing   Problem: Coping: Goal: Ability to adjust to condition or change in health will improve Outcome: Progressing   Problem: Fluid Volume: Goal: Ability to maintain a balanced intake and output will improve Outcome: Progressing   Problem: Health Behavior/Discharge Planning: Goal: Ability to identify and utilize available resources and services will improve Outcome: Progressing   Problem: Metabolic: Goal: Ability to maintain appropriate glucose levels will improve Outcome: Progressing   Problem: Skin Integrity: Goal: Risk for impaired skin integrity will decrease Outcome: Progressing   Problem: Tissue Perfusion: Goal: Adequacy of tissue perfusion will improve Outcome: Progressing   Problem: Coping: Goal: Level of anxiety will decrease Outcome: Progressing   Problem: Pain Managment: Goal: General experience of comfort will improve and/or be controlled Outcome: Progressing   Problem: Safety: Goal: Ability to remain free from injury will improve Outcome: Progressing   Problem: Skin Integrity: Goal: Risk for impaired skin integrity will decrease Outcome: Progressing

## 2023-10-17 NOTE — Plan of Care (Signed)
  Problem: Metabolic: Goal: Ability to maintain appropriate glucose levels will improve Outcome: Progressing   Problem: Tissue Perfusion: Goal: Adequacy of tissue perfusion will improve Outcome: Progressing   Problem: Coping: Goal: Level of anxiety will decrease Outcome: Progressing   Problem: Safety: Goal: Ability to remain free from injury will improve Outcome: Progressing   Problem: Pain Managment: Goal: General experience of comfort will improve and/or be controlled Outcome: Progressing   Problem: Clinical Measurements: Goal: Ability to maintain clinical measurements within normal limits will improve Outcome: Progressing

## 2023-10-17 NOTE — Progress Notes (Signed)
 Speech Language Pathology Treatment: Dysphagia  Patient Details Name: Daniel [name] MRN: [id number] DOB: 1940/06/29 Today's Date: 10/17/2023 Time: 8749-8685 SLP Time Calculation (min) (ACUTE ONLY): 24 min  Assessment / Plan / Recommendation Clinical Impression  This writer met with pt and his wife to discussion results of pt's Modified Barium Swallow Study. Education provided on profound oropharyngeal dysphagia, significant aspiration risk, current diet recommendation of strict NPO, inability of this writer to recommend any diet modifications and likelihood of respiratory decline with PO intake. Questions regarding dysphagia and aspiration risks were answered to her satisfaction. She had questions about symptom management which this writer was not able to answer. Secure chat sent to attending (Dr Kandis) making him aware of her questions.   Continue to recommend Palliative Care consult to aid in establishing GOC.   At this time, our services don't have any additional therapy or information to offer.    HPI HPI: 83 y.o male with significant PMH of permanent Atrial Fibrillation, SSS s/p pacemaker, Parkinson's Disease, SDH, glaucoma, Campylobacter enteritis, right heel pressure ulcer who presented to the ED in respiratory distress and hypoxia after a choking event on hotdog. In addition to the MBSS (2017) below pt reports recent dysphagia treatment with study during recovery at [facility] (May 2025). Pt's wife reports coughing at home during consumption but that he had been managing well.      SLP Plan  Consult other service (comment) (Palliative Care)          Recommendations  Diet recommendations: NPO Medication Administration: Via alternative means                  Oral care QID;Staff/trained caregiver to provide oral care     Dysphagia, oropharyngeal phase (R13.12)     Consult other service (comment) (Palliative Care)   Tawney Vanorman B. Rubbie, M.S., CCC-SLP,  Tree surgeon Certified Brain Injury Specialist [facility]  [facility] Office [phone] Ascom [phone] Fax [phone]

## 2023-10-17 NOTE — Progress Notes (Signed)
 Occupational Therapy Treatment Patient Details Name: Daniel [name] MRN: [id number] DOB: Jun 07, 1940 Today's Date: 10/17/2023   History of present illness Patient is a 83 year old male presenting with respiratory distress and hypoxia after a choking event on hotdog requiring intubation. Now extubated. PMH: SSS s/p pacemaker, Parkinson's Disease, SDH, glaucoma, Campylobacter enteritis, right heel pressure.   OT comments  Chart reviewed to date, pt greeted in bed with wife present, oriented to self and place. His cough appears weaker than evaluation. Tx session targeting improving functional activity tolerance in prep for ADL tasks. Pt appears with significantly increased tone, BLE>BUE. He requires MAX A +2 for bed mobility, poor sttaic sitting balance. MOD A for grooming sitting on edge of bed. HE requires MAX A +2 for rolling for peri care following incontinent BM. Pt is left quarter turned to the L at [facility] at 30 degrees, heels floated. Nurse notified of status. OT will continue to follow.       If plan is discharge home, recommend the following:  A lot of help with walking and/or transfers;A lot of help with bathing/dressing/bathroom;Supervision due to cognitive status   Equipment Recommendations  Hoyer lift    Recommendations for Other Services      Precautions / Restrictions Precautions Precautions: Fall Recall of Precautions/Restrictions: Impaired Restrictions Weight Bearing Restrictions Per Provider Order: No       Mobility Bed Mobility Overal bed mobility: Needs Assistance Bed Mobility: Supine to Sit, Sit to Supine     Supine to sit: Max assist, +2 for physical assistance, HOB elevated Sit to supine: Max assist, +2 for physical assistance, HOB elevated        Transfers                   General transfer comment: unable to attempt on this date due to poor sitting balance     Balance Overall balance assessment: Needs assistance Sitting-balance support: Feet  supported Sitting balance-Leahy Scale: Poor Sitting balance - Comments: significant posterior push Postural control: Posterior lean                                 ADL either performed or assessed with clinical judgement   ADL Overall ADL's : Needs assistance/impaired     Grooming: Moderate assistance;Sitting;Wash/dry face       Lower Body Bathing: Maximal assistance;Bed level;Total assistance               Toileting- Clothing Manipulation and Hygiene: Maximal assistance;Total assistance;Bed level Toileting - Clothing Manipulation Details (indicate cue type and reason): incontinent BM            Extremity/Trunk Assessment     Lower Extremity Assessment Lower Extremity Assessment:  (increased tone compared to evaluation- pt has not recieved his meds on this date after swallow study)        Vision Patient Visual Report: No change from baseline     Perception     Praxis     Communication Communication Communication: Impaired Factors Affecting Communication: Reduced clarity of speech   Cognition Arousal: Alert Behavior During Therapy: WFL for tasks assessed/performed Cognition: Cognition impaired, History of cognitive impairments   Orientation impairments: Time, Situation Awareness: Intellectual awareness impaired, Online awareness impaired Memory impairment (select all impairments): Declarative long-term memory Attention impairment (select first level of impairment): Sustained attention Executive functioning impairment (select all impairments): Problem solving  Following commands: Intact, Impaired Following commands impaired: Follows one step commands with increased time      Cueing   Cueing Techniques: Verbal cues, Tactile cues, Visual cues  Exercises Other Exercises Other Exercises: edu pt/wife re role of OT, role of rehab, pressure injury prevention    Shoulder Instructions       General Comments vss     Pertinent Vitals/ Pain       Pain Assessment Pain Assessment: PAINAD Breathing: normal Negative Vocalization: none Facial Expression: smiling or inexpressive Body Language: relaxed Consolability: no need to console PAINAD Score: 0 Pain Intervention(s): Monitored during session  Home Living                                          Prior Functioning/Environment              Frequency  Min 2X/week        Progress Toward Goals  OT Goals(current goals can now be found in the care plan section)  Progress towards OT goals: Progressing toward goals  Acute Rehab OT Goals Time For Goal Achievement: 10/29/23  Plan      Co-evaluation                 AM-PAC OT 6 Clicks Daily Activity     Outcome Measure   Help from another person eating meals?: Total Help from another person taking care of personal grooming?: A Lot Help from another person toileting, which includes using toliet, bedpan, or urinal?: A Lot Help from another person bathing (including washing, rinsing, drying)?: A Lot Help from another person to put on and taking off regular upper body clothing?: A Lot Help from another person to put on and taking off regular lower body clothing?: A Lot 6 Click Score: 11    End of Session    OT Visit Diagnosis: Other abnormalities of gait and mobility (R26.89);Muscle weakness (generalized) (M62.81)   Activity Tolerance Patient tolerated treatment well   Patient Left in bed;with call bell/phone within reach;with bed alarm set (quarter turned to his L, heels offloaded)   Nurse Communication Mobility status (needs prevlon boots)        Time: 8655-8578 OT Time Calculation (min): 37 min  Charges: OT General Charges $OT Visit: 1 Visit OT Treatments $Self Care/Home Management : 8-22 mins $Therapeutic Activity: 8-22 mins  Therisa Sheffield, OTD OTR/L  10/17/23, 3:11 PM

## 2023-10-17 NOTE — Progress Notes (Signed)
 PROGRESS NOTE    Daniel [name]  FMW:[id number] DOB: 13-Oct-1940 DOA: 10/13/2023 PCP: Valora Agent, MD  Outpatient Specialists: neurology    Brief Narrative:   83 y.o male with significant PMH of permanent Atrial Fibrillation, SSS s/p pacemaker, Parkinson's Disease, SDH, glaucoma, Campylobacter enteritis, right heel pressure ulcer who presented to the ED in respiratory distress and hypoxia after a choking event on hotdog.   ED Course: Initial vital signs showed HR of 155 beats/minute, BP 162/96 mm Hg, the RR 24 breaths/minute, and the oxygen saturation 100% on NRB and a temperature of 97.28F (36.3C).    Pertinent Labs/Diagnostics Findings: Na+/ K+: 137/3.4.  Glucose: 77 BUN/Cr.:  39/1.09  AST/ALT: 51/14 Unremarkable CBC Lactic acid:  UA positive   Patient was intubated for airway protection and PCCM consulted for STAT Bronchoscopy. Flexible Bronchoscopy was performed at the bediside for foreign body extraction with findings of foreign body in the RLL bronchus, extracted through a roth net retrieval device. Patient transferred to the ICU post procedure.   Assessment & Plan:   Principal Problem:   Acute hypoxic respiratory failure (HCC) Active Problems:   Parkinson's disease (HCC)   Pressure injury of skin   Atrial fibrillation (HCC)  # Aspiration Of hot dog, s/p extraction from bronchus intermedius by pulm. Now breathing comfortably on room air, s/p intubation - monitor  # Dysphagia Chronic problem 2/2 parkinson's, severe oropharyngeal dysphagia on barium swallow today. Wife wants to feed, aware of significant aspiration risk. Declines palliative approach for now, we also discussed risks/benefits of PEG tube and she declines that - will clarify diet order with slp  # Hospital delirium Wife says normally alert and oriented at home but is susceptible to hospital delirium, happens every time he's admitted. Improving - delirium precautions  # Parkinson's - home  sinemet   # Bacteriuria Culture growing e coli but per wife no sympotms - hold ceftriaxone   # Sick sinus S/p pacer  # Debility Pt/ot advising snf but wife declines, she has aides at home - home health pt/ot at d/c  # A-fib Not anticoagulated, BB has been discontinued by cards   DVT prophylaxis: heparin  Code Status: full Family Communication: wife updated at bedside 9/25  Level of care: Med-Surg Status is: Inpatient Remains inpatient appropriate because: wife can't take home today (can tomorrow)    Consultants:  none  Procedures: S/p bronchoscopy, intubation  Antimicrobials:  S/p ceftriaxone     Subjective: Pleasant confusion  Objective: Vitals:   10/16/23 2020 10/17/23 0410 10/17/23 0419 10/17/23 0736  BP:  (!) 140/87  125/79  Pulse:  99  92  Resp:    16  Temp: 97.8 F (36.6 C) 98.3 F (36.8 C)    TempSrc: Oral     SpO2:  98%  98%  Weight:   68.2 kg   Height:        Intake/Output Summary (Last 24 hours) at 10/17/2023 1239 Last data filed at 10/17/2023 9391 Gross per 24 hour  Intake 1074.94 ml  Output 700 ml  Net 374.94 ml   Filed Weights   10/15/23 0500 10/16/23 0500 10/17/23 0419  Weight: 68.6 kg 65.6 kg 68.2 kg    Examination:  General exam: Appears calm and comfortable, disheveled Respiratory system: Clear to auscultation. Respiratory effort normal. Cardiovascular system: S1 & S2 heard, RRR.   Gastrointestinal system: Abdomen is nondistended, soft and nontender.  Central nervous system: Alert , moving all 4, confused Extremities: warm, no edema Skin: No rashes, lesions or ulcers Psychiatry: confused,  calm    Data Reviewed: I have personally reviewed following labs and imaging studies  CBC: Recent Labs  Lab 10/13/23 1828 10/14/23 0302 10/15/23 0453 10/16/23 0522  WBC 10.1 14.1* 9.7 8.6  HGB 14.6 12.7* 12.0* 13.4  HCT 44.0 38.5* 36.1* 39.0  MCV 88.0 89.1 87.6 86.1  PLT 279 208 215 232   Basic Metabolic Panel: Recent Labs   Lab 10/13/23 1828 10/14/23 0302 10/15/23 0453 10/16/23 0522 10/17/23 0514  NA 137 134* 139 140 140  K 3.4* 4.1 3.6 3.7 3.6  CL 104 105 105 107 106  CO2 18* 22 25 23 23   GLUCOSE 177* 131* 100* 106* 106*  BUN 39* 35* 30* 26* 23  CREATININE 1.09 0.86 0.95 0.79 0.80  CALCIUM 9.2 8.6* 8.8* 8.9 9.0  MG  --  2.0 1.9 2.0  --   PHOS  --  3.0 2.9 2.6  --    GFR: Estimated Creatinine Clearance: 68.7 mL/min (by C-G formula based on SCr of 0.8 mg/dL). Liver Function Tests: Recent Labs  Lab 10/13/23 1828  AST 51*  ALT 14  ALKPHOS 93  BILITOT 0.9  PROT 7.5  ALBUMIN 3.9   No results for input(s): LIPASE, AMYLASE in the last 168 hours. No results for input(s): AMMONIA in the last 168 hours. Coagulation Profile: Recent Labs  Lab 10/13/23 1828  INR 1.1   Cardiac Enzymes: No results for input(s): CKTOTAL, CKMB, CKMBINDEX, TROPONINI in the last 168 hours. BNP (last 3 results) No results for input(s): PROBNP in the last 8760 hours. HbA1C: No results for input(s): HGBA1C in the last 72 hours. CBG: Recent Labs  Lab 10/16/23 2018 10/17/23 0032 10/17/23 0411 10/17/23 0735 10/17/23 1143  GLUCAP 129* 98 100* 108* 103*   Lipid Profile: No results for input(s): CHOL, HDL, LDLCALC, TRIG, CHOLHDL, LDLDIRECT in the last 72 hours. Thyroid  Function Tests: No results for input(s): TSH, T4TOTAL, FREET4, T3FREE, THYROIDAB in the last 72 hours.  Anemia Panel: No results for input(s): VITAMINB12, FOLATE, FERRITIN, TIBC, IRON, RETICCTPCT in the last 72 hours. Urine analysis:    Component Value Date/Time   COLORURINE YELLOW (A) 10/13/2023 1830   APPEARANCEUR CLOUDY (A) 10/13/2023 1830   LABSPEC 1.017 10/13/2023 1830   PHURINE 5.0 10/13/2023 1830   GLUCOSEU NEGATIVE 10/13/2023 1830   HGBUR MODERATE (A) 10/13/2023 1830   BILIRUBINUR NEGATIVE 10/13/2023 1830   KETONESUR 5 (A) 10/13/2023 1830   PROTEINUR 30 (A) 10/13/2023 1830   NITRITE  POSITIVE (A) 10/13/2023 1830   LEUKOCYTESUR LARGE (A) 10/13/2023 1830   Sepsis Labs: @LABRCNTIP (procalcitonin:4,lacticidven:4)  ) Recent Results (from the past 240 hours)  Urine Culture     Status: Abnormal   Collection Time: 10/13/23  6:30 PM   Specimen: Urine, Random  Result Value Ref Range Status   Specimen Description   Final    URINE, RANDOM Performed at [facility], [address], [place], [place] [address]    Special Requests   Final    NONE Reflexed from [id number] Performed at [facility], [address] [place], [place] [address]    Culture >=100,000 COLONIES/mL ESCHERICHIA COLI (A)  Final   Report Status 10/15/2023 FINAL  Final   Organism ID, Bacteria ESCHERICHIA COLI (A)  Final      Susceptibility   Escherichia coli - MIC*    AMPICILLIN >=32 RESISTANT Resistant     CEFAZOLIN  (URINE) Value in next row Sensitive      4 SENSITIVEThis is a modified FDA-approved test that  has been validated and its performance characteristics determined by the reporting laboratory.  This laboratory is certified under the Clinical Laboratory Improvement Amendments CLIA as qualified to perform high complexity clinical laboratory testing.    CEFEPIME Value in next row Sensitive      4 SENSITIVEThis is a modified FDA-approved test that has been validated and its performance characteristics determined by the reporting laboratory.  This laboratory is certified under the Clinical Laboratory Improvement Amendments CLIA as qualified to perform high complexity clinical laboratory testing.    ERTAPENEM Value in next row Sensitive      4 SENSITIVEThis is a modified FDA-approved test that has been validated and its performance characteristics determined by the reporting laboratory.  This laboratory is certified under the Clinical Laboratory Improvement Amendments CLIA as qualified to perform high complexity clinical laboratory testing.    CEFTRIAXONE  Value in next row Sensitive      4  SENSITIVEThis is a modified FDA-approved test that has been validated and its performance characteristics determined by the reporting laboratory.  This laboratory is certified under the Clinical Laboratory Improvement Amendments CLIA as qualified to perform high complexity clinical laboratory testing.    CIPROFLOXACIN Value in next row Sensitive      4 SENSITIVEThis is a modified FDA-approved test that has been validated and its performance characteristics determined by the reporting laboratory.  This laboratory is certified under the Clinical Laboratory Improvement Amendments CLIA as qualified to perform high complexity clinical laboratory testing.    GENTAMICIN  Value in next row Sensitive      4 SENSITIVEThis is a modified FDA-approved test that has been validated and its performance characteristics determined by the reporting laboratory.  This laboratory is certified under the Clinical Laboratory Improvement Amendments CLIA as qualified to perform high complexity clinical laboratory testing.    NITROFURANTOIN Value in next row Sensitive      4 SENSITIVEThis is a modified FDA-approved test that has been validated and its performance characteristics determined by the reporting laboratory.  This laboratory is certified under the Clinical Laboratory Improvement Amendments CLIA as qualified to perform high complexity clinical laboratory testing.    TRIMETH /SULFA  Value in next row Sensitive      4 SENSITIVEThis is a modified FDA-approved test that has been validated and its performance characteristics determined by the reporting laboratory.  This laboratory is certified under the Clinical Laboratory Improvement Amendments CLIA as qualified to perform high complexity clinical laboratory testing.    AMPICILLIN/SULBACTAM Value in next row Intermediate      4 SENSITIVEThis is a modified FDA-approved test that has been validated and its performance characteristics determined by the reporting laboratory.  This  laboratory is certified under the Clinical Laboratory Improvement Amendments CLIA as qualified to perform high complexity clinical laboratory testing.    PIP/TAZO Value in next row Sensitive      <=4 SENSITIVEThis is a modified FDA-approved test that has been validated and its performance characteristics determined by the reporting laboratory.  This laboratory is certified under the Clinical Laboratory Improvement Amendments CLIA as qualified to perform high complexity clinical laboratory testing.    MEROPENEM Value in next row Sensitive      <=4 SENSITIVEThis is a modified FDA-approved test that has been validated and its performance characteristics determined by the reporting laboratory.  This laboratory is certified under the Clinical Laboratory Improvement Amendments CLIA as qualified to perform high complexity clinical laboratory testing.    * >=100,000 COLONIES/mL ESCHERICHIA COLI  MRSA Next Gen by PCR, Nasal  Status: None   Collection Time: 10/13/23  8:00 PM   Specimen: Nasal Mucosa; Nasal Swab  Result Value Ref Range Status   MRSA by PCR Next Gen NOT DETECTED NOT DETECTED Final    Comment: (NOTE) The GeneXpert MRSA Assay (FDA approved for NASAL specimens only), is one component of a comprehensive MRSA colonization surveillance program. It is not intended to diagnose MRSA infection nor to guide or monitor treatment for MRSA infections. Test performance is not FDA approved in patients less than 67 years old. Performed at [facility], [address] [place], [place] [address]          Radiology Studies: DG Swallowing [facility] Pathology Result Date: 10/17/2023 Table formatting from the original result was not included. Modified Barium Swallow Study Patient Details Name: Daniel [name] MRN: [id number] Date of Birth: 12-15-1940 Today's Date: 10/17/2023 HPI/PMH: HPI: 83 y.o male with significant PMH of permanent Atrial Fibrillation, SSS s/p pacemaker, Parkinson's Disease,  SDH, glaucoma, Campylobacter enteritis, right heel pressure ulcer who presented to the ED in respiratory distress and hypoxia after a choking event on hotdog. In addition to the MBSS (2017) below pt reports recent dysphagia treatment with study during recovery at [facility] (May 2025). Pt's wife reports coughing at home during consumption but that he had been managing well. Clinical Impression: Pt presents with profound oropharyngeal dysphagia placing him at a high of aspiration and asphyxia with all PO intake. Prior to initial of PO trials, pt with continued decreased vocal intensity, imprecise artication, flaccid appearing open mouth posture, hypernasal resonance resulting in < 25% speech intelligibility at word/phrase. Pt's cough is weak and nonproductive.     Pt presents with what is likely a chronic dysphagia. During this study, all aspects of pt's swallow are profoundly impaired resulting in gross silent and sensed aspiration of nectar thick liquids and puree. Puree bolus was observed hanging in pharynx over pt's airway with no ability to clear residuals after ~ 10 cued swallows. While pt didn't exhibit any overt s/s of respiratory compromise or asphyxia, subjectively pt's eyes widened and reddened with extended attempts to swallow, cough or hock up the bolus. Extended time (5 minutes) and maximal cues required for pt to clear enough of the bolus to end fluro monitoring. Study discontinued d/t severe risk of medical compromise. At this time, pt is at a high risk of aspiration with all PO intake as well as his own salvia. Recommend strict NPO with discussion regarding GOC.      Objective impairments were observed as follows:  Oral Phase  Poor lingual control, bolus preparation/transport resulting in passive spillage of boluses over base of tongue and significant oral residue with all consistencies prompting multiple swallows per each bolus  Pharyngeal Phase  Very little soft palate elevation likely  resulting in reduced velopharyngeal pressure (positive pressure in pharynx)  Significantly reduced laryngeal elevation, anterior hyoid excursion,epiglottic movement, pharyngeal stripping wave, tongue base retraction and pharyngoesophageal segment opening resulting in entirety of bolus remaining in the pharynx.   Compensatory strategies were not effective in reducing pharyngeal residue or aspiration. Factors that may increase risk of adverse event in presence of aspiration Noe & Lianne 2021): Factors that may increase risk of adverse event in presence of aspiration Noe & Lianne 2021): Poor general health and/or compromised immunity; Reduced cognitive function; Frail or deconditioned; Dependence for feeding and/or oral hygiene; Reduced saliva; Weak cough (recent aspiration of hotdog) Recommendations/Plan: Swallowing Evaluation Recommendations Swallowing Evaluation Recommendations Recommendations: NPO Medication Administration: Via alternative  means Oral care recommendations: Oral care QID (4x/day) Recommended consults: Consider Palliative care Caregiver Recommendations: Have oral suction available Treatment Plan Treatment Plan Treatment recommendations: Therapy as outlined in treatment plan below Follow-up recommendations: -- (Palliative Care consult) Treatment frequency: Min 2x/week Treatment duration: 2 weeks Recommendations Recommendations for follow up therapy are one component of a multi-disciplinary discharge planning process, led by the attending physician.  Recommendations may be updated based on patient status, additional functional criteria and insurance authorization. Assessment: Orofacial Exam: Orofacial Exam Oral Cavity: Oral Hygiene: WFL Oral Cavity - Dentition: Adequate natural dentition Orofacial Anatomy: WFL Oral Motor/Sensory Function: Generalized oral weakness Anatomy: Anatomy: Suspected cervical osteophytes Boluses Administered: Boluses Administered Boluses Administered: Mildly thick  liquids (Level 2, nectar thick); Puree  Oral Impairment Domain: Oral Impairment Domain Lip Closure: No labial escape Tongue control during bolus hold: Posterior escape of greater than half of bolus; Posterior escape of less than half of bolus Bolus preparation/mastication: Disorganized chewing/mashing with solid pieces of bolus unchewed; Slow prolonged chewing/mashing with complete recollection Bolus transport/lingual motion: Repetitive/disorganized tongue motion; Minimal-no tongue motion; Slow tongue motion Oral residue: Majority of bolus remaining; Residue collection on oral structures Location of oral residue : Floor of mouth; Tongue; Palate Initiation of pharyngeal swallow : No visible initiation at any location; Pyriform sinuses  Pharyngeal Impairment Domain: Pharyngeal Impairment Domain Soft palate elevation: No bolus between soft palate (SP)/pharyngeal wall (PW) (while no bolus was observed in nasopharynx, pt with severey hypernasal speech and very little soft palate activation/elevation) Laryngeal elevation: Minimal superior movement of thyroid  cartilage with minimal approximation of arytenoids to epiglottic petiole; No superior movement of thyroid  cartilage Anterior hyoid excursion: Partial anterior movement; No anterior movement Epiglottic movement: No inversion; Partial inversion Laryngeal vestibule closure: None, wide column air/contrast in laryngeal vestibule; Incomplete, narrow column air/contrast in laryngeal vestibule Pharyngeal stripping wave : Present - diminished Pharyngoesophageal segment opening: No distension with total obstruction of flow; Minimal distention/minimal duration, marked obstruction of flow Tongue base retraction: Wide column of contrast or air between tongue base and PPW Pharyngeal residue: Minimal to no pharyngeal clearance; Majority of contrast within or on pharyngeal structures Location of pharyngeal residue: Tongue base; Valleculae; Pharyngeal wall; Pyriform sinuses;  Aryepiglottic folds  Esophageal Impairment Domain: No data recorded Pill: No data recorded Penetration/Aspiration Scale Score: Penetration/Aspiration Scale Score 7.  Material enters airway, passes BELOW cords and not ejected out despite cough attempt by patient: Mildly thick liquids (Level 2, nectar thick); Puree 8.  Material enters airway, passes BELOW cords without attempt by patient to eject out (silent aspiration) : Mildly thick liquids (Level 2, nectar thick); Puree Compensatory Strategies: Compensatory Strategies Compensatory strategies: Yes Effortful swallow: Ineffective Ineffective Effortful Swallow: Mildly thick liquid (Level 2, nectar thick); Puree Multiple swallows: Ineffective Ineffective Multiple Swallows: Mildly thick liquid (Level 2, nectar thick); Puree Chin tuck: Ineffective Ineffective Chin Tuck: Mildly thick liquid (Level 2, nectar thick); Puree Left head turn: Ineffective Ineffective Left Head Turn: Mildly thick liquid (Level 2, nectar thick); Puree   General Information: Caregiver present: No  Diet Prior to this Study: NPO   Temperature : Normal   Respiratory Status: WFL   Supplemental O2: None (Room air)   History of Recent Intubation: Yes  Behavior/Cognition: Alert; Cooperative (< 25% speech intelligibility) Self-Feeding Abilities: Needs set-up for self-feeding; Needs assist with self-feeding; Needs hand-over-hand assist for feeding; Dependent for feeding Baseline vocal quality/speech: Aphonic; Hypophonia/low volume; Abnormal resonance; Dysphonic Volitional Cough: Able to elicit Volitional Swallow: Unable to elicit Exam Limitations: No limitations Goal  Planning: Prognosis for improved oropharyngeal function: Guarded Barriers to Reach Goals: Cognitive deficits; Time post onset; Severity of deficits; Overall medical prognosis No data recorded Patient/Family Stated Goal: what were the results Consulted and agree with results and recommendations: Patient; Physician; Nurse (wife coming in around  12pm this wirter to visit with wife to review the results) Pain: Pain Assessment Pain Assessment: No/denies pain End of Session: Start Time:SLP Start Time (ACUTE ONLY): 0745 Stop Time: SLP Stop Time (ACUTE ONLY): 0805 Time Calculation:SLP Time Calculation (min) (ACUTE ONLY): 20 min Charges: SLP Evaluations $ SLP Speech Visit: 1 Visit SLP Evaluations $MBS Swallow: 1 Procedure $Swallowing Treatment: 1 Procedure SLP visit diagnosis: SLP Visit Diagnosis: Dysphagia, oropharyngeal phase (R13.12) Past Medical History: Past Medical History: Diagnosis Date  AF (atrial fibrillation) (HCC)   B12 deficiency   Coronary artery disease   DH (dermatitis herpetiformis)   Dysphagia   Hypertension   Presence of permanent cardiac pacemaker   2006  Primary parkinsonism [facility])   Prostate cancer (HCC) 2014 Past Surgical History: Past Surgical History: Procedure Laterality Date  APPENDECTOMY    CARDIAC CATHETERIZATION    CATARACT EXTRACTION W/PHACO Left 05/04/2020  Procedure: CATARACT EXTRACTION PHACO AND INTRAOCULAR LENS PLACEMENT (IOC) LEFT  6.76 00:59.9 11.3%;  Surgeon: Mittie Gaskin, MD;  Location: [facility];  Service: Ophthalmology;  Laterality: Left;  CATARACT EXTRACTION W/PHACO Right 05/18/2020  Procedure: CATARACT EXTRACTION PHACO AND INTRAOCULAR LENS PLACEMENT (IOC) RIGHT 15.56 01:52.6 13.8%;  Surgeon: Mittie Gaskin, MD;  Location: [facility];  Service: Ophthalmology;  Laterality: Right;  COLONOSCOPY  2008, 2014  CYSTOSCOPY WITH LITHOLAPAXY N/A 09/17/2019  Procedure: CYSTOSCOPY WITH LITHOLAPAXY;  Surgeon: Kassie Ozell SAUNDERS, MD;  Location: [facility] ORS;  Service: Urology;  Laterality: N/A;  ESOPHAGOGASTRODUODENOSCOPY (EGD) WITH PROPOFOL  N/A 10/13/2015  Procedure: ESOPHAGOGASTRODUODENOSCOPY (EGD) WITH PROPOFOL ;  Surgeon: Gladis RAYMOND Mariner, MD;  Location: [facility] ENDOSCOPY;  Service: Endoscopy;  Laterality: N/A;  FASCIOTOMY Left 01/30/2019  Procedure: I&D BELOW FASCIA FOOT SINGLE LEFT;  Surgeon: Ashley Soulier, DPM;   Location: [facility] ORS;  Service: Podiatry;  Laterality: Left;  GREEN LIGHT LASER TURP (TRANSURETHRAL RESECTION OF PROSTATE N/A 09/17/2019  Procedure: GREEN LIGHT LASER TURP (TRANSURETHRAL RESECTION OF PROSTATE;  Surgeon: Kassie Ozell SAUNDERS, MD;  Location: [facility] ORS;  Service: Urology;  Laterality: N/A;  HIP ARTHROPLASTY Left 04/22/2023  Procedure: HEMIARTHROPLASTY (BIPOLAR) HIP, POSTERIOR APPROACH FOR FRACTURE;  Surgeon: Edie Norleen PARAS, MD;  Location: [facility] ORS;  Service: Orthopedics;  Laterality: Left;  PACEMAKER PLACEMENT  2006 Happi Overton 10/17/2023, 8:59 AM       Scheduled Meds:  carbidopa -levodopa   1 tablet Oral QHS   carbidopa -levodopa   2 tablet Oral TID with meals   Chlorhexidine  Gluconate Cloth  6 each Topical QHS   heparin  injection (subcutaneous)  5,000 Units Subcutaneous Q8H   mouth rinse  15 mL Mouth Rinse QID   Continuous Infusions:     LOS: 4 days     Devaughn KATHEE Ban, MD [facility]   If 7PM-7AM, please contact night-coverage [email or web address] Password [email or web address] 10/17/2023, 12:39 PM

## 2023-10-18 DIAGNOSIS — Z789 Other specified health status: Secondary | ICD-10-CM | POA: Diagnosis not present

## 2023-10-18 DIAGNOSIS — Z7189 Other specified counseling: Secondary | ICD-10-CM | POA: Diagnosis not present

## 2023-10-18 DIAGNOSIS — J9601 Acute respiratory failure with hypoxia: Secondary | ICD-10-CM | POA: Diagnosis not present

## 2023-10-18 LAB — BASIC METABOLIC PANEL WITH GFR
Anion gap: 10 (ref 5–15)
BUN: 22 mg/dL (ref 8–23)
CO2: 24 mmol/L (ref 22–32)
Calcium: 8.8 mg/dL — ABNORMAL LOW (ref 8.9–10.3)
Chloride: 107 mmol/L (ref 98–111)
Creatinine, Ser: 0.69 mg/dL (ref 0.61–1.24)
GFR, Estimated: >60 mL/min (ref >60)
Glucose, Bld: 100 mg/dL — ABNORMAL HIGH (ref 70–99)
Potassium: 3.4 mmol/L — ABNORMAL LOW (ref 3.5–5.1)
Sodium: 141 mmol/L (ref 135–145)

## 2023-10-18 MED ORDER — BOOST / RESOURCE BREEZE PO LIQD CUSTOM
1.0000 | Freq: Three times a day (TID) | ORAL | Status: DC
  Administered 2023-10-18 – 2023-10-19 (×4): 1 via ORAL

## 2023-10-18 MED ORDER — GLYCOPYRROLATE 1 MG PO TABS: 1.0000 mg | ORAL_TABLET | ORAL | Status: DC | PRN

## 2023-10-18 MED ORDER — HALOPERIDOL 0.5 MG PO TABS: 0.5000 mg | ORAL_TABLET | ORAL | Status: DC | PRN

## 2023-10-18 MED ORDER — POLYVINYL ALCOHOL 1.4 % OP SOLN: 1.0000 [drp] | Freq: Four times a day (QID) | OPHTHALMIC | Status: DC | PRN

## 2023-10-18 MED ORDER — LORAZEPAM 1 MG PO TABS: 1.0000 mg | ORAL_TABLET | ORAL | Status: DC | PRN

## 2023-10-18 MED ORDER — OXYCODONE HCL 20 MG/ML PO CONC: 5.0000 mg | ORAL | Status: DC | PRN

## 2023-10-18 MED ORDER — SODIUM CHLORIDE 0.9 % IV BOLUS
500.0000 mL | Freq: Once | INTRAVENOUS | Status: AC
  Administered 2023-10-18: 500 mL via INTRAVENOUS

## 2023-10-18 MED ORDER — GLYCOPYRROLATE 0.2 MG/ML IJ SOLN: 0.2000 mg | INTRAMUSCULAR | Status: DC | PRN

## 2023-10-18 MED ORDER — BIOTENE DRY MOUTH MT LIQD: 15.0000 mL | OROMUCOSAL | Status: DC | PRN

## 2023-10-18 MED ORDER — ACETAMINOPHEN 325 MG PO TABS: 650.0000 mg | ORAL_TABLET | Freq: Four times a day (QID) | ORAL | Status: DC | PRN

## 2023-10-18 MED ORDER — ONDANSETRON 4 MG PO TBDP: 4.0000 mg | ORAL_TABLET | Freq: Four times a day (QID) | ORAL | Status: DC | PRN

## 2023-10-18 MED ORDER — LORAZEPAM 2 MG/ML IJ SOLN: 1.0000 mg | INTRAMUSCULAR | Status: DC | PRN

## 2023-10-18 MED ORDER — HALOPERIDOL LACTATE 2 MG/ML PO CONC: 0.5000 mg | ORAL | Status: DC | PRN

## 2023-10-18 MED ORDER — HALOPERIDOL LACTATE 5 MG/ML IJ SOLN: 0.5000 mg | INTRAMUSCULAR | Status: DC | PRN

## 2023-10-18 MED ORDER — LORAZEPAM 2 MG/ML PO CONC: 1.0000 mg | ORAL | Status: DC | PRN

## 2023-10-18 MED ORDER — ACETAMINOPHEN 650 MG RE SUPP: 650.0000 mg | Freq: Four times a day (QID) | RECTAL | Status: DC | PRN

## 2023-10-18 MED ORDER — ONDANSETRON HCL 4 MG/2ML IJ SOLN: 4.0000 mg | Freq: Four times a day (QID) | INTRAMUSCULAR | Status: DC | PRN

## 2023-10-18 NOTE — Progress Notes (Signed)
 [facility]) HOSPITAL LIAISON NOTE  Received request from Dalia Fuse, Transitions of Care (TOC), for hospice services at home after discharge. Spoke with patient, his wife and 2 sons at the bedside, to initiate education related to hospice philosophy, services, and team approach to care. Patient/family verbalized understanding of information given. Per discussion, the plan is for discharge home by private vehicle once DME is delivered.   DME needs discussed.   Patient has the following equipment in the home:  standard and transport wheelchair, [facility], walker  Patient/family requests the following equipment for delivery:   hospital bed and hoyer lift          The address has been verified and is correct in the chart.  Zakaree Mcclenahan is the family contact to arrange time of equipment delivery.  Please send signed and completed DNR home with patient/family if applicable.   Please provide prescriptions at discharge as needed to ensure ongoing symptom management.   [facility] information and contact numbers given to patient's wife   Above information shared with Zenobia Frazier, [facility] and hospital medical care team.  Please call with any hospice related questions or concerns.  Thank you for the opportunity to participate in this patient's care.  Saddie HILARIO Na, MA, BSN, RN, FNE Nurse Liaison [phone]

## 2023-10-18 NOTE — Plan of Care (Signed)
  Problem: Clinical Measurements: Goal: Respiratory complications will improve Outcome: Progressing   Problem: Activity: Goal: Risk for activity intolerance will decrease Outcome: Progressing   Problem: Nutrition: Goal: Adequate nutrition will be maintained Outcome: Progressing   Problem: Pain Managment: Goal: General experience of comfort will improve and/or be controlled Outcome: Progressing   Problem: Safety: Goal: Ability to remain free from injury will improve Outcome: Progressing   Problem: Skin Integrity: Goal: Risk for impaired skin integrity will decrease Outcome: Progressing

## 2023-10-18 NOTE — TOC Initial Note (Addendum)
 Transition of Care [facility]) - Initial/Assessment Note    Patient Details  Name: Daniel [name] MRN: [id number] Date of Birth: 26-Feb-1940  [facility]) CM/SW Contact:    Dalia GORMAN Fuse, RN Phone Number: 10/18/2023, 11:41 AM  Clinical Narrative:                 TOC visited the patient in his room. His wife and daughter are present for the visit. The patient is comfort care and he would like to go home with hospice. The patient and family choose [facility]. Referral sent to [facility].  The patient has the following DME: Shower seat - built [employer] fundraiser (2 wheels);Rollator (4 wheels);BSC/3in1;Wheelchair - manual;Grab bars - tub/shower   Expected Discharge Plan: Home w Hospice Care Barriers to Discharge: Continued Medical Work up   Patient Goals and CMS Choice     Choice offered to / list presented to : Patient, Spouse, Adult Children      Expected Discharge Plan and Services   Discharge Planning Services: CM Consult   Living arrangements for the past 2 months: Single Family Home                             [facility] Agency: [facility] Date [facility] Agency Contacted: 10/18/23 Time HH Agency Contacted: [phone] Representative spoke with at [facility] Agency: Saddie Na  Prior Living Arrangements/Services Living arrangements for the past 2 months: Single Family Home Lives with:: Significant Other              Current home services:  (Shower seat - built Char[employer] fundraiser (2 wheels);Rollator (4 wheels);BSC/3in1;Wheelchair - manual;Grab bars - tub/shower)    Activities of Daily Living   ADL Screening (condition at time of admission) Independently performs ADLs?: No Does the patient have a NEW difficulty with bathing/dressing/toileting/self-feeding that is expected to last >3 days?: No Does the patient have a NEW difficulty with getting in/out of bed, walking, or climbing stairs that is expected to last >3 days?: No Does the patient have a NEW  difficulty with communication that is expected to last >3 days?: No Is the patient deaf or have difficulty hearing?: Yes Does the patient have difficulty seeing, even when wearing glasses/contacts?: Yes Does the patient have difficulty concentrating, remembering, or making decisions?: No  Permission Sought/Granted                  Emotional Assessment              Admission diagnosis:  Aspiration into airway, initial encounter [T17.908A] Acute hypoxic respiratory failure (HCC) [J96.01] Patient Active Problem List   Diagnosis Date Noted   Pressure injury of skin 10/16/2023   Acute hypoxic respiratory failure (HCC) 10/13/2023   Glaucoma 06/16/2023   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   Pressure injury of right heel, stage 2 (HCC) 06/16/2023   Generalized weakness 06/16/2023   Campylobacter diarrhea 06/15/2023   Acute diarrhea 06/14/2023   Hip fracture (HCC) 04/22/2023   Parkinson's disease (HCC) 04/22/2023   Fall 04/22/2023   Atrial fibrillation with rapid ventricular response (HCC) 04/22/2023   Atrial fibrillation (HCC) 04/27/2013   Pacemaker 07/04/2004   PCP:  Valora Agent, MD Pharmacy:   [facility] - [place], [place] - [address] [place] [address] Phone: [phone] Fax: [phone]     Social Drivers of Health (SDOH) Social History: SDOH Screenings  Food Insecurity: No Food Insecurity (10/13/2023)  Housing: Low Risk  (10/13/2023)  Transportation Needs: No Transportation Needs (10/13/2023)  Utilities: Not At Risk (10/13/2023)  Financial Resource Strain: Low Risk  (08/02/2023)   Received from [facility]  Social Connections: Socially Integrated (10/13/2023)  Tobacco Use: Low Risk  (10/14/2023)   SDOH Interventions:     Readmission Risk Interventions     No data to display

## 2023-10-18 NOTE — Progress Notes (Addendum)
 Daily Progress Note   Patient Name: Daniel [name]       Date: 10/18/2023 DOB: [birth year]  Age: 83 y.o. MRN#: [id number] Attending Physician: Kandis Devaughn Sayres, MD Primary Care Physician: Valora Agent, MD Admit Date: 10/13/2023  Reason for Consultation/Follow-up: Establishing goals of care  Subjective: Notes and labs reviewed.  Spoke with SLP in depth about patient's swallow evaluation.  She discusses that there was no laryngeal edema or trauma noted.   In to speak with patient and family.  During visit patient looks at me and other family members, but does not speak.  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  Spoke louder and he still did not speak.  Patient's wife (they have been married for 20 years), his children, and caregiver were at bedside.  Family discusses that patient has had coughing with eating and drinking and at times liquid that he drinks comes from his nose.  They discuss that they were at a cookout when the patient ate a hot dog.  They state a nurse was present and it took 3 to 4 minutes the time work before she was able to dislodge the hot dog.  They advise she initiated CPR as he did not have a pulse.  They discussed that EMS came on scene and intubated the patient.  They discuss that he had turned blue.  They state at baseline he is able to walk with assistance and sometimes with a walker.  They discussed that he is able to pull himself up to stand at the sink at times.  They discussed that he is sometimes continent of bowel and bladder and other times is not.  They state he feeds himself.  They discussed that when he has both hearing aids working he is able to converse, but is very difficult to understand at times.  Wife and his children states his hearing aids are  broken currently and will need to be taken for repair.   We discussed his diagnoses, prognosis, GOC, EOL wishes disposition and options.  Created space and opportunity for patient  to explore thoughts and feelings regarding current medical information.   A detailed discussion was had today regarding advanced directives.  Concepts specific to code status, artifical feeding and hydration, IV antibiotics and rehospitalization were discussed.  The difference between an  aggressive medical intervention path and a comfort care path was discussed.  Values and goals of care important to patient and family were attempted to be elicited.  Discussed limitations of medical interventions to prolong quality of life in some situations and discussed the concept of human mortality.  Discussed his swallowing in depth. Wife advises that patient has been asked by neurology previously due to his Parkinson's if he would ever want a feeding tube and has expressed a desire not to ever have a feeding tube placed.  Children agree with this.  They discussed the goal for him not to suffer.  Questions answered regarding palliative and hospice.  Questions answered regarding discharge with hospice, and expectations moving forward including end-of-life process.  MOST form completed at bedside with wife and children, and caregiver present and it was signed by wife.  Attending and TOC updated.  Family is clear they want hospice agency that is on [address].  I completed a MOST form today and the signed original was placed in the chart. Each section of options on the form were reviewed in full detail and any questions were answered as needed. The form was scanned and sent to medical records for it to be uploaded under ACP tab in Epic. A photocopy was also placed in the chart to be scanned into EMR. The patient outlined their wishes for the following treatment decisions:  Cardiopulmonary Resuscitation: Do Not Attempt  Resuscitation (DNR/No CPR)  Medical Interventions: Comfort Measures: Keep clean, warm, and dry. Use medication by any route, positioning, wound care, and other measures to relieve pain and suffering. Use oxygen, suction and manual treatment of airway obstruction as needed for comfort. Do not transfer to the hospital unless comfort needs cannot be met in current location.  Antibiotics: No antibiotics (use other measures to relieve symptoms)  IV Fluids: No IV fluids (provide other measures to ensure comfort)  Feeding Tube: No feeding tube     Length of Stay: 5  Current Medications: Scheduled Meds:   carbidopa -levodopa   1 tablet Oral QHS   carbidopa -levodopa   2 tablet Oral TID with meals   Chlorhexidine  Gluconate Cloth  6 each Topical QHS   feeding supplement  1 Container Oral TID BM   mouth rinse  15 mL Mouth Rinse QID    Continuous Infusions:   PRN Meds: acetaminophen  **OR** acetaminophen , antiseptic oral rinse, artificial tears, docusate sodium , glycopyrrolate  **OR** glycopyrrolate  **OR** glycopyrrolate , haloperidol  **OR** haloperidol  **OR** haloperidol  lactate, LORazepam  **OR** LORazepam  **OR** LORazepam , ondansetron  **OR** ondansetron  (ZOFRAN ) IV, mouth rinse, oxyCODONE  **OR** oxyCODONE , polyethylene glycol  Physical Exam Pulmonary:     Effort: Pulmonary effort is normal.  Neurological:     Mental Status: He is alert.             Vital Signs: BP 101/62 (BP Location: Left Arm)   Pulse (!) 59   Temp 98.5 F (36.9 C)   Resp 18   Ht 6' 2 (1.88 m)   Wt 68.2 kg   SpO2 96%   BMI 19.30 kg/m  SpO2: SpO2: 96 % O2 Device: O2 Device: Room Air O2 Flow Rate: O2 Flow Rate (L/min): 4 L/min  Intake/output summary:  Intake/Output Summary (Last 24 hours) at 10/18/2023 1315 Last data filed at 10/18/2023 0900 Gross per 24 hour  Intake 540 ml  Output 500 ml  Net 40 ml   LBM: Last BM Date : 10/17/23 Baseline Weight: Weight: 74 kg Most recent weight: Weight: 68.2 kg  Patient  Active Problem List   Diagnosis Date Noted   Pressure injury of skin 10/16/2023   Acute hypoxic respiratory failure (HCC) 10/13/2023   Glaucoma 06/16/2023   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  Code Status:    Code Status Orders  (From admission, onward)           Start     Ordered   10/18/23 1313  Do not attempt resuscitation (DNR) - Comfort care  Continuous       Question Answer Comment  If patient has no pulse and is not breathing Do Not Attempt Resuscitation   In Pre-Arrest Conditions (Patient Is Breathing and Has a Pulse) Provide comfort measures. Relieve any mechanical airway obstruction. Avoid transfer unless required for comfort.   Consent: Discussion documented in EHR or advanced directives reviewed      10/18/23 1313           Code Status History     Date Active Date Inactive Code Status Order ID Comments User Context   10/18/2023 1310 10/18/2023 1313 Do not attempt resuscitation (DNR) - Comfort care [id number]  Signa Salines, NP Inpatient   10/13/2023 1921 10/18/2023 1310 Full Code [id number]  Isadora Hose, MD ED   06/14/2023 0919 06/16/2023 1809 Full Code [id number]  Barbarann Nest, MD ED   04/22/2023 1530 04/26/2023 2139 Full Code [id number]  Eldonna Elspeth PARAS, MD ED   01/30/2019 1026 01/30/2019 1759 Full Code [id number]  Ashley Soulier, DPM Inpatient       Thank you for allowing the Palliative Medicine Team to assist in the care of this  patient.    Salines Signa, NP  Please contact Palliative Medicine Team phone at [phone] for questions and concerns.

## 2023-10-18 NOTE — Progress Notes (Signed)
 PROGRESS NOTE    Daniel [name]  FMW:[id number] DOB: 1940-03-27 DOA: 10/13/2023 PCP: Valora Agent, MD  Outpatient Specialists: neurology    Brief Narrative:   83 y.o male with significant PMH of permanent Atrial Fibrillation, SSS s/p pacemaker, Parkinson's Disease, SDH, glaucoma, Campylobacter enteritis, right heel pressure ulcer who presented to the ED in respiratory distress and hypoxia after a choking event on hotdog.   ED Course: Initial vital signs showed HR of 155 beats/minute, BP 162/96 mm Hg, the RR 24 breaths/minute, and the oxygen saturation 100% on NRB and a temperature of 97.69F (36.3C).    Pertinent Labs/Diagnostics Findings: Na+/ K+: 137/3.4.  Glucose: 77 BUN/Cr.:  39/1.09  AST/ALT: 51/14 Unremarkable CBC Lactic acid:  UA positive   Patient was intubated for airway protection and PCCM consulted for STAT Bronchoscopy. Flexible Bronchoscopy was performed at the bediside for foreign body extraction with findings of foreign body in the RLL bronchus, extracted through a roth net retrieval device. Patient transferred to the ICU post procedure.   Assessment & Plan:   Principal Problem:   Acute hypoxic respiratory failure (HCC) Active Problems:   Parkinson's disease (HCC)   Pressure injury of skin   Atrial fibrillation (HCC)  # Aspiration Of hot dog, s/p extraction from bronchus intermedius by pulm. Now breathing comfortably on room air, s/p intubation - monitor  # Dysphagia Chronic problem 2/2 parkinson's, severe oropharyngeal dysphagia on barium swallow today. Wife wants to feed, aware of significant aspiration risk. Declines palliative approach for now, we also discussed risks/benefits of PEG tube and she declines that. Palliative and hospice engaged today, now transitioned to comfort care. Liquids to start. Home with hospice tomorrow, dme to be delivered today.  # Hospital delirium Wife says normally alert and oriented at home but is susceptible to hospital  delirium, happens every time he's admitted. Appears resolved  # Parkinson's - home sinemet   # Bacteriuria Culture growing e coli but per wife no symptoms - hold ceftriaxone   # Sick sinus S/p pacer  # Debility Pt/ot advising snf but wife declines, she has aides at home. Now transitioned to comfort care  # A-fib Not anticoagulated, BB has been discontinued by cards   DVT prophylaxis: heparin  Code Status: full Family Communication: wife updated at bedside 9/26  Level of care: Med-Surg Status is: Inpatient Remains inpatient appropriate because: wife can't take home today (can tomorrow)    Consultants:  palliative  Procedures: S/p bronchoscopy, intubation  Antimicrobials:  S/p ceftriaxone     Subjective: No complaints  Objective: Vitals:   10/17/23 1950 10/18/23 0423 10/18/23 0500 10/18/23 0812  BP: (!) 90/50 (!) 96/50 114/71 101/62  Pulse: 67 75  (!) 59  Resp: 15 16  18   Temp: 97.9 F (36.6 C) 97.9 F (36.6 C)  98.5 F (36.9 C)  TempSrc:      SpO2: 97% 96%  96%  Weight:      Height:        Intake/Output Summary (Last 24 hours) at 10/18/2023 1519 Last data filed at 10/18/2023 0900 Gross per 24 hour  Intake 540 ml  Output 500 ml  Net 40 ml   Filed Weights   10/15/23 0500 10/16/23 0500 10/17/23 0419  Weight: 68.6 kg 65.6 kg 68.2 kg    Examination:  General exam: Appears calm and comfortable, disheveled Respiratory system: Clear to auscultation. Respiratory effort normal. Cardiovascular system: S1 & S2 heard, RRR.   Gastrointestinal system: Abdomen is nondistended, soft and nontender.  Central nervous system: Alert ,  moving all 4,  Extremities: warm, no edema Skin: No rashes, lesions or ulcers Psychiatry: calm    Data Reviewed: I have personally reviewed following labs and imaging studies  CBC: Recent Labs  Lab 10/13/23 1828 10/14/23 0302 10/15/23 0453 10/16/23 0522  WBC 10.1 14.1* 9.7 8.6  HGB 14.6 12.7* 12.0* 13.4  HCT 44.0 38.5*  36.1* 39.0  MCV 88.0 89.1 87.6 86.1  PLT 279 208 215 232   Basic Metabolic Panel: Recent Labs  Lab 10/14/23 0302 10/15/23 0453 10/16/23 0522 10/17/23 0514 10/18/23 0444  NA 134* 139 140 140 141  K 4.1 3.6 3.7 3.6 3.4*  CL 105 105 107 106 107  CO2 22 25 23 23 24   GLUCOSE 131* 100* 106* 106* 100*  BUN 35* 30* 26* 23 22  CREATININE 0.86 0.95 0.79 0.80 0.69  CALCIUM 8.6* 8.8* 8.9 9.0 8.8*  MG 2.0 1.9 2.0  --   --   PHOS 3.0 2.9 2.6  --   --    GFR: Estimated Creatinine Clearance: 68.7 mL/min (by C-G formula based on SCr of 0.69 mg/dL). Liver Function Tests: Recent Labs  Lab 10/13/23 1828  AST 51*  ALT 14  ALKPHOS 93  BILITOT 0.9  PROT 7.5  ALBUMIN 3.9   No results for input(s): LIPASE, AMYLASE in the last 168 hours. No results for input(s): AMMONIA in the last 168 hours. Coagulation Profile: Recent Labs  Lab 10/13/23 1828  INR 1.1   Cardiac Enzymes: No results for input(s): CKTOTAL, CKMB, CKMBINDEX, TROPONINI in the last 168 hours. BNP (last 3 results) No results for input(s): PROBNP in the last 8760 hours. HbA1C: No results for input(s): HGBA1C in the last 72 hours. CBG: Recent Labs  Lab 10/16/23 2018 10/17/23 0032 10/17/23 0411 10/17/23 0735 10/17/23 1143  GLUCAP 129* 98 100* 108* 103*   Lipid Profile: No results for input(s): CHOL, HDL, LDLCALC, TRIG, CHOLHDL, LDLDIRECT in the last 72 hours. Thyroid  Function Tests: No results for input(s): TSH, T4TOTAL, FREET4, T3FREE, THYROIDAB in the last 72 hours.  Anemia Panel: No results for input(s): VITAMINB12, FOLATE, FERRITIN, TIBC, IRON, RETICCTPCT in the last 72 hours. Urine analysis:    Component Value Date/Time   COLORURINE YELLOW (A) 10/13/2023 1830   APPEARANCEUR CLOUDY (A) 10/13/2023 1830   LABSPEC 1.017 10/13/2023 1830   PHURINE 5.0 10/13/2023 1830   GLUCOSEU NEGATIVE 10/13/2023 1830   HGBUR MODERATE (A) 10/13/2023 1830   BILIRUBINUR  NEGATIVE 10/13/2023 1830   KETONESUR 5 (A) 10/13/2023 1830   PROTEINUR 30 (A) 10/13/2023 1830   NITRITE POSITIVE (A) 10/13/2023 1830   LEUKOCYTESUR LARGE (A) 10/13/2023 1830   Sepsis Labs: @LABRCNTIP (procalcitonin:4,lacticidven:4)  ) Recent Results (from the past 240 hours)  Urine Culture     Status: Abnormal   Collection Time: 10/13/23  6:30 PM   Specimen: Urine, Random  Result Value Ref Range Status   Specimen Description   Final    URINE, RANDOM Performed at [facility], [address], [place], [place] [address]    Special Requests   Final    NONE Reflexed from [id number] Performed at [facility], [address] [place], [place] [address]    Culture >=100,000 COLONIES/mL ESCHERICHIA COLI (A)  Final   Report Status 10/15/2023 FINAL  Final   Organism ID, Bacteria ESCHERICHIA COLI (A)  Final      Susceptibility   Escherichia coli - MIC*    AMPICILLIN >=32 RESISTANT Resistant     CEFAZOLIN  (URINE) Value in next  row Sensitive      4 SENSITIVEThis is a modified FDA-approved test that has been validated and its performance characteristics determined by the reporting laboratory.  This laboratory is certified under the Clinical Laboratory Improvement Amendments CLIA as qualified to perform high complexity clinical laboratory testing.    CEFEPIME Value in next row Sensitive      4 SENSITIVEThis is a modified FDA-approved test that has been validated and its performance characteristics determined by the reporting laboratory.  This laboratory is certified under the Clinical Laboratory Improvement Amendments CLIA as qualified to perform high complexity clinical laboratory testing.    ERTAPENEM Value in next row Sensitive      4 SENSITIVEThis is a modified FDA-approved test that has been validated and its performance characteristics determined by the reporting laboratory.  This laboratory is certified under the Clinical Laboratory Improvement Amendments CLIA as qualified to  perform high complexity clinical laboratory testing.    CEFTRIAXONE  Value in next row Sensitive      4 SENSITIVEThis is a modified FDA-approved test that has been validated and its performance characteristics determined by the reporting laboratory.  This laboratory is certified under the Clinical Laboratory Improvement Amendments CLIA as qualified to perform high complexity clinical laboratory testing.    CIPROFLOXACIN Value in next row Sensitive      4 SENSITIVEThis is a modified FDA-approved test that has been validated and its performance characteristics determined by the reporting laboratory.  This laboratory is certified under the Clinical Laboratory Improvement Amendments CLIA as qualified to perform high complexity clinical laboratory testing.    GENTAMICIN  Value in next row Sensitive      4 SENSITIVEThis is a modified FDA-approved test that has been validated and its performance characteristics determined by the reporting laboratory.  This laboratory is certified under the Clinical Laboratory Improvement Amendments CLIA as qualified to perform high complexity clinical laboratory testing.    NITROFURANTOIN Value in next row Sensitive      4 SENSITIVEThis is a modified FDA-approved test that has been validated and its performance characteristics determined by the reporting laboratory.  This laboratory is certified under the Clinical Laboratory Improvement Amendments CLIA as qualified to perform high complexity clinical laboratory testing.    TRIMETH /SULFA  Value in next row Sensitive      4 SENSITIVEThis is a modified FDA-approved test that has been validated and its performance characteristics determined by the reporting laboratory.  This laboratory is certified under the Clinical Laboratory Improvement Amendments CLIA as qualified to perform high complexity clinical laboratory testing.    AMPICILLIN/SULBACTAM Value in next row Intermediate      4 SENSITIVEThis is a modified FDA-approved test that  has been validated and its performance characteristics determined by the reporting laboratory.  This laboratory is certified under the Clinical Laboratory Improvement Amendments CLIA as qualified to perform high complexity clinical laboratory testing.    PIP/TAZO Value in next row Sensitive      <=4 SENSITIVEThis is a modified FDA-approved test that has been validated and its performance characteristics determined by the reporting laboratory.  This laboratory is certified under the Clinical Laboratory Improvement Amendments CLIA as qualified to perform high complexity clinical laboratory testing.    MEROPENEM Value in next row Sensitive      <=4 SENSITIVEThis is a modified FDA-approved test that has been validated and its performance characteristics determined by the reporting laboratory.  This laboratory is certified under the Clinical Laboratory Improvement Amendments CLIA as qualified to perform high complexity clinical laboratory testing.    * >=  100,000 COLONIES/mL ESCHERICHIA COLI  MRSA Next Gen by PCR, Nasal     Status: None   Collection Time: 10/13/23  8:00 PM   Specimen: Nasal Mucosa; Nasal Swab  Result Value Ref Range Status   MRSA by PCR Next Gen NOT DETECTED NOT DETECTED Final    Comment: (NOTE) The GeneXpert MRSA Assay (FDA approved for NASAL specimens only), is one component of a comprehensive MRSA colonization surveillance program. It is not intended to diagnose MRSA infection nor to guide or monitor treatment for MRSA infections. Test performance is not FDA approved in patients less than 36 years old. Performed at [facility], [address], [place], [place] [address]          Radiology Studies: DG Swallowing [facility] Pathology Result Date: 10/17/2023 Table formatting from the original result was not included. Modified Barium Swallow Study Patient Details Name: Daniel [name] MRN: [id number] Date of Birth: 18-Mar-1940 Today's Date: 10/17/2023 HPI/PMH: HPI: 83 y.o  male with significant PMH of permanent Atrial Fibrillation, SSS s/p pacemaker, Parkinson's Disease, SDH, glaucoma, Campylobacter enteritis, right heel pressure ulcer who presented to the ED in respiratory distress and hypoxia after a choking event on hotdog. In addition to the MBSS (2017) below pt reports recent dysphagia treatment with study during recovery at [facility] (May 2025). Pt's wife reports coughing at home during consumption but that he had been managing well. Clinical Impression: Pt presents with profound oropharyngeal dysphagia placing him at a high of aspiration and asphyxia with all PO intake. Prior to initial of PO trials, pt with continued decreased vocal intensity, imprecise artication, flaccid appearing open mouth posture, hypernasal resonance resulting in < 25% speech intelligibility at word/phrase. Pt's cough is weak and nonproductive.     Pt presents with what is likely a chronic dysphagia. During this study, all aspects of pt's swallow are profoundly impaired resulting in gross silent and sensed aspiration of nectar thick liquids and puree. Puree bolus was observed hanging in pharynx over pt's airway with no ability to clear residuals after ~ 10 cued swallows. While pt didn't exhibit any overt s/s of respiratory compromise or asphyxia, subjectively pt's eyes widened and reddened with extended attempts to swallow, cough or hock up the bolus. Extended time (5 minutes) and maximal cues required for pt to clear enough of the bolus to end fluro monitoring. Study discontinued d/t severe risk of medical compromise. At this time, pt is at a high risk of aspiration with all PO intake as well as his own salvia. Recommend strict NPO with discussion regarding GOC.      Objective impairments were observed as follows:  Oral Phase  Poor lingual control, bolus preparation/transport resulting in passive spillage of boluses over base of tongue and significant oral residue with all consistencies prompting  multiple swallows per each bolus  Pharyngeal Phase  Very little soft palate elevation likely resulting in reduced velopharyngeal pressure (positive pressure in pharynx)  Significantly reduced laryngeal elevation, anterior hyoid excursion,epiglottic movement, pharyngeal stripping wave, tongue base retraction and pharyngoesophageal segment opening resulting in entirety of bolus remaining in the pharynx.   Compensatory strategies were not effective in reducing pharyngeal residue or aspiration. Factors that may increase risk of adverse event in presence of aspiration Noe & Lianne 2021): Factors that may increase risk of adverse event in presence of aspiration Noe & Lianne 2021): Poor general health and/or compromised immunity; Reduced cognitive function; Frail or deconditioned; Dependence for feeding and/or oral hygiene; Reduced saliva; Weak cough (recent aspiration  of hotdog) Recommendations/Plan: Swallowing Evaluation Recommendations Swallowing Evaluation Recommendations Recommendations: NPO Medication Administration: Via alternative means Oral care recommendations: Oral care QID (4x/day) Recommended consults: Consider Palliative care Caregiver Recommendations: Have oral suction available Treatment Plan Treatment Plan Treatment recommendations: Therapy as outlined in treatment plan below Follow-up recommendations: -- (Palliative Care consult) Treatment frequency: Min 2x/week Treatment duration: 2 weeks Recommendations Recommendations for follow up therapy are one component of a multi-disciplinary discharge planning process, led by the attending physician.  Recommendations may be updated based on patient status, additional functional criteria and insurance authorization. Assessment: Orofacial Exam: Orofacial Exam Oral Cavity: Oral Hygiene: WFL Oral Cavity - Dentition: Adequate natural dentition Orofacial Anatomy: WFL Oral Motor/Sensory Function: Generalized oral weakness Anatomy: Anatomy: Suspected cervical  osteophytes Boluses Administered: Boluses Administered Boluses Administered: Mildly thick liquids (Level 2, nectar thick); Puree  Oral Impairment Domain: Oral Impairment Domain Lip Closure: No labial escape Tongue control during bolus hold: Posterior escape of greater than half of bolus; Posterior escape of less than half of bolus Bolus preparation/mastication: Disorganized chewing/mashing with solid pieces of bolus unchewed; Slow prolonged chewing/mashing with complete recollection Bolus transport/lingual motion: Repetitive/disorganized tongue motion; Minimal-no tongue motion; Slow tongue motion Oral residue: Majority of bolus remaining; Residue collection on oral structures Location of oral residue : Floor of mouth; Tongue; Palate Initiation of pharyngeal swallow : No visible initiation at any location; Pyriform sinuses  Pharyngeal Impairment Domain: Pharyngeal Impairment Domain Soft palate elevation: No bolus between soft palate (SP)/pharyngeal wall (PW) (while no bolus was observed in nasopharynx, pt with severey hypernasal speech and very little soft palate activation/elevation) Laryngeal elevation: Minimal superior movement of thyroid  cartilage with minimal approximation of arytenoids to epiglottic petiole; No superior movement of thyroid  cartilage Anterior hyoid excursion: Partial anterior movement; No anterior movement Epiglottic movement: No inversion; Partial inversion Laryngeal vestibule closure: None, wide column air/contrast in laryngeal vestibule; Incomplete, narrow column air/contrast in laryngeal vestibule Pharyngeal stripping wave : Present - diminished Pharyngoesophageal segment opening: No distension with total obstruction of flow; Minimal distention/minimal duration, marked obstruction of flow Tongue base retraction: Wide column of contrast or air between tongue base and PPW Pharyngeal residue: Minimal to no pharyngeal clearance; Majority of contrast within or on pharyngeal structures Location of  pharyngeal residue: Tongue base; Valleculae; Pharyngeal wall; Pyriform sinuses; Aryepiglottic folds  Esophageal Impairment Domain: No data recorded Pill: No data recorded Penetration/Aspiration Scale Score: Penetration/Aspiration Scale Score 7.  Material enters airway, passes BELOW cords and not ejected out despite cough attempt by patient: Mildly thick liquids (Level 2, nectar thick); Puree 8.  Material enters airway, passes BELOW cords without attempt by patient to eject out (silent aspiration) : Mildly thick liquids (Level 2, nectar thick); Puree Compensatory Strategies: Compensatory Strategies Compensatory strategies: Yes Effortful swallow: Ineffective Ineffective Effortful Swallow: Mildly thick liquid (Level 2, nectar thick); Puree Multiple swallows: Ineffective Ineffective Multiple Swallows: Mildly thick liquid (Level 2, nectar thick); Puree Chin tuck: Ineffective Ineffective Chin Tuck: Mildly thick liquid (Level 2, nectar thick); Puree Left head turn: Ineffective Ineffective Left Head Turn: Mildly thick liquid (Level 2, nectar thick); Puree   General Information: Caregiver present: No  Diet Prior to this Study: NPO   Temperature : Normal   Respiratory Status: WFL   Supplemental O2: None (Room air)   History of Recent Intubation: Yes  Behavior/Cognition: Alert; Cooperative (< 25% speech intelligibility) Self-Feeding Abilities: Needs set-up for self-feeding; Needs assist with self-feeding; Needs hand-over-hand assist for feeding; Dependent for feeding Baseline vocal quality/speech: Aphonic; Hypophonia/low volume; Abnormal resonance; Dysphonic  Volitional Cough: Able to elicit Volitional Swallow: Unable to elicit Exam Limitations: No limitations Goal Planning: Prognosis for improved oropharyngeal function: Guarded Barriers to Reach Goals: Cognitive deficits; Time post onset; Severity of deficits; Overall medical prognosis No data recorded Patient/Family Stated Goal: what were the results Consulted and agree  with results and recommendations: Patient; Physician; Nurse (wife coming in around 12pm this wirter to visit with wife to review the results) Pain: Pain Assessment Pain Assessment: No/denies pain End of Session: Start Time:SLP Start Time (ACUTE ONLY): 0745 Stop Time: SLP Stop Time (ACUTE ONLY): 0805 Time Calculation:SLP Time Calculation (min) (ACUTE ONLY): 20 min Charges: SLP Evaluations $ SLP Speech Visit: 1 Visit SLP Evaluations $MBS Swallow: 1 Procedure $Swallowing Treatment: 1 Procedure SLP visit diagnosis: SLP Visit Diagnosis: Dysphagia, oropharyngeal phase (R13.12) Past Medical History: Past Medical History: Diagnosis Date  AF (atrial fibrillation) (HCC)   B12 deficiency   Coronary artery disease   DH (dermatitis herpetiformis)   Dysphagia   Hypertension   Presence of permanent cardiac pacemaker   2006  Primary parkinsonism [facility])   Prostate cancer (HCC) 2014 Past Surgical History: Past Surgical History: Procedure Laterality Date  APPENDECTOMY    CARDIAC CATHETERIZATION    CATARACT EXTRACTION W/PHACO Left 05/04/2020  Procedure: CATARACT EXTRACTION PHACO AND INTRAOCULAR LENS PLACEMENT (IOC) LEFT  6.76 00:59.9 11.3%;  Surgeon: Mittie Gaskin, MD;  Location: [facility];  Service: Ophthalmology;  Laterality: Left;  CATARACT EXTRACTION W/PHACO Right 05/18/2020  Procedure: CATARACT EXTRACTION PHACO AND INTRAOCULAR LENS PLACEMENT (IOC) RIGHT 15.56 01:52.6 13.8%;  Surgeon: Mittie Gaskin, MD;  Location: [facility];  Service: Ophthalmology;  Laterality: Right;  COLONOSCOPY  2008, 2014  CYSTOSCOPY WITH LITHOLAPAXY N/A 09/17/2019  Procedure: CYSTOSCOPY WITH LITHOLAPAXY;  Surgeon: Kassie Ozell SAUNDERS, MD;  Location: [facility];  Service: Urology;  Laterality: N/A;  ESOPHAGOGASTRODUODENOSCOPY (EGD) WITH PROPOFOL  N/A 10/13/2015  Procedure: ESOPHAGOGASTRODUODENOSCOPY (EGD) WITH PROPOFOL ;  Surgeon: Gladis RAYMOND Mariner, MD;  Location: [facility];  Service: Endoscopy;  Laterality: N/A;  FASCIOTOMY Left  01/30/2019  Procedure: I&D BELOW FASCIA FOOT SINGLE LEFT;  Surgeon: Ashley Soulier, DPM;  Location: [facility];  Service: Podiatry;  Laterality: Left;  GREEN LIGHT LASER TURP (TRANSURETHRAL RESECTION OF PROSTATE N/A 09/17/2019  Procedure: GREEN LIGHT LASER TURP (TRANSURETHRAL RESECTION OF PROSTATE;  Surgeon: Kassie Ozell SAUNDERS, MD;  Location: [facility];  Service: Urology;  Laterality: N/A;  HIP ARTHROPLASTY Left 04/22/2023  Procedure: HEMIARTHROPLASTY (BIPOLAR) HIP, POSTERIOR APPROACH FOR FRACTURE;  Surgeon: Edie Norleen PARAS, MD;  Location: [facility];  Service: Orthopedics;  Laterality: Left;  PACEMAKER PLACEMENT  2006 Happi Overton 10/17/2023, 8:59 AM       Scheduled Meds:  carbidopa -levodopa   1 tablet Oral QHS   carbidopa -levodopa   2 tablet Oral TID with meals   Chlorhexidine  Gluconate Cloth  6 each Topical QHS   feeding supplement  1 Container Oral TID BM   mouth rinse  15 mL Mouth Rinse QID   Continuous Infusions:     LOS: 5 days     Devaughn KATHEE Ban, MD [facility]   If 7PM-7AM, please contact night-coverage [email or web address] Password [email or web address] 10/18/2023, 3:19 PM

## 2023-10-18 NOTE — Progress Notes (Signed)
 Nutrition Brief Note  Chart reviewed. Pt now transitioning to comfort care.  No further nutrition interventions planned at this time.  Please re-consult as needed.   Margery ORN, RD, LDN, CDCES Registered Dietitian III Certified Diabetes Care and Education Specialist If unable to reach this RD, please use RD Inpatient group chat on secure chat between hours of 8am-4 pm daily

## 2023-10-18 NOTE — Plan of Care (Signed)
 Patient got up to the chair today but it was a 2x max assist.  Problem: Coping: Goal: Ability to adjust to condition or change in health will improve Outcome: Progressing   Problem: Skin Integrity: Goal: Risk for impaired skin integrity will decrease Outcome: Progressing   Problem: Nutritional: Goal: Maintenance of adequate nutrition will improve Outcome: Not Progressing

## 2023-10-19 DIAGNOSIS — J9601 Acute respiratory failure with hypoxia: Secondary | ICD-10-CM | POA: Diagnosis not present

## 2023-10-19 NOTE — Progress Notes (Signed)
 Family refused EMS transport, despite being made aware patient was a total transfer with very minimal effort from patient. Patient transferred to w/c via staff x2.

## 2023-10-19 NOTE — TOC Transition Note (Signed)
 Transition of Care [facility]) - Discharge Note   Patient Details  Name: Daniel [name] MRN: [id number] Date of Birth: 08-Mar-1940  Transition of Care [facility]) CM/SW Contact:  [place] L Halynn Reitano, LCSW Phone Number: 10/19/2023, 10:38 AM   Clinical Narrative:     CSW was made aware by RN of mobility issues. CSW advised that it was unsafe for patient to travel via car. CSW called the spouse to advise of concerns and offer EMS transport. Spouse declined and advised that patient has two sons that are coming with her to discharge patient who can get patient into the car.   CSW advised spouse that [facility] will not provide assistance with getting patient into the car because family has been advised of safety concerns. Spouse stated that she has already been made aware and she understands.   TOC signing off.     Barriers to Discharge: Continued Medical Work up   Patient Goals and CMS Choice     Choice offered to / list presented to : Patient, Spouse, Adult Children      Discharge Placement                       Discharge Plan and Services Additional resources added to the After Visit Summary for     Discharge Planning Services: CM Consult                        [facility] Agency: [facility] Date [facility] Agency Contacted: 10/18/23 Time HH Agency Contacted: [phone] Representative spoke with at [facility] Agency: Saddie Na  Social Drivers of Health (SDOH) Interventions SDOH Screenings   Food Insecurity: No Food Insecurity (10/13/2023)  Housing: Low Risk  (10/13/2023)  Transportation Needs: No Transportation Needs (10/13/2023)  Utilities: Not At Risk (10/13/2023)  Financial Resource Strain: Low Risk  (08/02/2023)   Received from [facility]  Social Connections: Socially Integrated (10/13/2023)  Tobacco Use: Low Risk  (10/14/2023)     Readmission Risk Interventions     No data to display

## 2023-10-19 NOTE — Discharge Summary (Signed)
 Daniel [name] FMW:[id number] DOB: [birth year] DOA: 10/13/2023  PCP: Valora Agent, MD  Admit date: 10/13/2023 Discharge date: 10/19/2023  Time spent: 35 minutes  Recommendations for Outpatient Follow-up:  Establishing with hospice    Discharge Diagnoses:  Principal Problem:   Acute hypoxic respiratory failure (HCC) Active Problems:   Parkinson's disease (HCC)   Pressure injury of skin   Atrial fibrillation [facility])   Discharge Condition: stable  Diet recommendation: liquids for now  Filed Weights   10/15/23 0500 10/16/23 0500 10/17/23 0419  Weight: 68.6 kg 65.6 kg 68.2 kg    History of present illness:  From admission h and p Daniel [name] with significant PMH of permanent Atrial Fibrillation, SSS s/p pacemaker, Parkinson's Disease, SDH, glaucoma, Campylobacter enteritis, right heel pressure ulcer who presented to the ED in respiratory distress and hypoxia after a choking event on hotdog.   ED Course: Initial vital signs showed HR of 155 beats/minute, BP 162/96 mm Hg, the RR 24 breaths/minute, and the oxygen saturation 100% on NRB and a temperature of 97.79F (36.3C).    Pertinent Labs/Diagnostics Findings: Na+/ K+: 137/3.4.  Glucose: 77 BUN/Cr.:  39/1.09  AST/ALT: 51/14 Unremarkable CBC Lactic acid:  UA positive   Patient was intubated for airway protection and PCCM consulted for STAT Bronchoscopy. Flexible Bronchoscopy was performed at the bediside for foreign body extraction with findings of foreign body in the RLL bronchus, extracted through a roth net retrieval device. Patient transferred to the ICU post procedure.  Hospital Course:   Patient presents after choking on a hot dog at home, was in respiratory distress, was intubated, bronch performed and hot dog extracted from bronchus, swallow eval shows profound dysphagia, per slp not safe for any diet, hospice and palliative engaged, transitioned to full comfort care, discharged home with home hospice. Hospital delirium  resolved, other chronic problems stable.   Procedures: See above   Consultations: None (was originally under care of icu service)  Discharge Exam: Vitals:   10/18/23 2009 10/19/23 0738  BP: 120/77 (!) 104/54  Pulse: 68 67  Resp: 16 17  Temp: 98.6 F (37 C) 98.1 F (36.7 C)  SpO2: 100% 100%    General: NAD Cardiovascular: RRR Respiratory: CTAB  Discharge Instructions   Discharge Instructions     Diet full liquid   Complete by: As directed    Increase activity slowly   Complete by: As directed    No wound care   Complete by: As directed       Allergies as of 10/19/2023       Reactions   Clindamycin /lincomycin Hives   Clindamycin  Hives   Doxycycline Rash   Hydrocodone -acetaminophen  Rash   Levaquin [levofloxacin] Rash        Medication List     STOP taking these medications    cyanocobalamin  1000 MCG tablet   dapsone  25 MG tablet       TAKE these medications    acetaminophen  325 MG tablet Commonly known as: TYLENOL  Take 1-2 tablets (325-650 mg total) by mouth every 6 (six) hours as needed for mild pain (pain score 1-3) (or temp > 100.5).   carbidopa -levodopa  50-200 MG tablet Commonly known as: SINEMET  CR Take 1 tablet by mouth at bedtime.   carbidopa -levodopa  25-100 MG tablet Commonly known as: SINEMET  IR Take 2 tablets by mouth with breakfast, with lunch, and with evening meal.       Allergies  Allergen Reactions   Clindamycin /Lincomycin Hives   Clindamycin  Hives   Doxycycline Rash  Hydrocodone -Acetaminophen  Rash   Levaquin [Levofloxacin] Rash    Follow-up Information     [facility] Follow up.   Contact information: [address] [place] [address] [phone]                  The results of significant diagnostics from this hospitalization (including imaging, microbiology, ancillary and laboratory) are listed below for reference.    Significant Diagnostic Studies: DG Swallowing Func-Speech  Pathology Result Date: 10/17/2023 Table formatting from the original result was not included. Modified Barium Swallow Study Patient Details Name: Daniel [name] MRN: [id number] Date of Birth: [birth year] Today's Date: 10/17/2023 HPI/PMH: HPI: Daniel [name] with significant PMH of permanent Atrial Fibrillation, SSS s/p pacemaker, Parkinson's Disease, SDH, glaucoma, Campylobacter enteritis, right heel pressure ulcer who presented to the ED in respiratory distress and hypoxia after a choking event on hotdog. In addition to the MBSS (2017) below pt reports recent dysphagia treatment with study during recovery at [facility] (May 2025). Pt's wife reports coughing at home during consumption but that he had been managing well. Clinical Impression: Pt presents with profound oropharyngeal dysphagia placing him at a high of aspiration and asphyxia with all PO intake. Prior to initial of PO trials, pt with continued decreased vocal intensity, imprecise artication, flaccid appearing open mouth posture, hypernasal resonance resulting in < 25% speech intelligibility at word/phrase. Pt's cough is weak and nonproductive.     Pt presents with what is likely a chronic dysphagia. During this study, all aspects of pt's swallow are profoundly impaired resulting in gross silent and sensed aspiration of nectar thick liquids and puree. Puree bolus was observed hanging in pharynx over pt's airway with no ability to clear residuals after ~ 10 cued swallows. While pt didn't exhibit any overt s/s of respiratory compromise or asphyxia, subjectively pt's eyes widened and reddened with extended attempts to swallow, cough or hock up the bolus. Extended time (5 minutes) and maximal cues required for pt to clear enough of the bolus to end fluro monitoring. Study discontinued d/t severe risk of medical compromise. At this time, pt is at a high risk of aspiration with all PO intake as well as his own salvia. Recommend strict NPO with discussion  regarding GOC.      Objective impairments were observed as follows:  Oral Phase  Poor lingual control, bolus preparation/transport resulting in passive spillage of boluses over base of tongue and significant oral residue with all consistencies prompting multiple swallows per each bolus  Pharyngeal Phase  Very little soft palate elevation likely resulting in reduced velopharyngeal pressure (positive pressure in pharynx)  Significantly reduced laryngeal elevation, anterior hyoid excursion,epiglottic movement, pharyngeal stripping wave, tongue base retraction and pharyngoesophageal segment opening resulting in entirety of bolus remaining in the pharynx.   Compensatory strategies were not effective in reducing pharyngeal residue or aspiration. Factors that may increase risk of adverse event in presence of aspiration Noe & Lianne 2021): Factors that may increase risk of adverse event in presence of aspiration Noe & Lianne 2021): Poor general health and/or compromised immunity; Reduced cognitive function; Frail or deconditioned; Dependence for feeding and/or oral hygiene; Reduced saliva; Weak cough (recent aspiration of hotdog) Recommendations/Plan: Swallowing Evaluation Recommendations Swallowing Evaluation Recommendations Recommendations: NPO Medication Administration: Via alternative means Oral care recommendations: Oral care QID (4x/day) Recommended consults: Consider Palliative care Caregiver Recommendations: Have oral suction available Treatment Plan Treatment Plan Treatment recommendations: Therapy as outlined in treatment plan below Follow-up recommendations: -- (Palliative Care consult) Treatment frequency:  Min 2x/week Treatment duration: 2 weeks Recommendations Recommendations for follow up therapy are one component of a multi-disciplinary discharge planning process, led by the attending physician.  Recommendations may be updated based on patient status, additional functional criteria and insurance  authorization. Assessment: Orofacial Exam: Orofacial Exam Oral Cavity: Oral Hygiene: WFL Oral Cavity - Dentition: Adequate natural dentition Orofacial Anatomy: WFL Oral Motor/Sensory Function: Generalized oral weakness Anatomy: Anatomy: Suspected cervical osteophytes Boluses Administered: Boluses Administered Boluses Administered: Mildly thick liquids (Level 2, nectar thick); Puree  Oral Impairment Domain: Oral Impairment Domain Lip Closure: No labial escape Tongue control during bolus hold: Posterior escape of greater than half of bolus; Posterior escape of less than half of bolus Bolus preparation/mastication: Disorganized chewing/mashing with solid pieces of bolus unchewed; Slow prolonged chewing/mashing with complete recollection Bolus transport/lingual motion: Repetitive/disorganized tongue motion; Minimal-no tongue motion; Slow tongue motion Oral residue: Majority of bolus remaining; Residue collection on oral structures Location of oral residue : Floor of mouth; Tongue; Palate Initiation of pharyngeal swallow : No visible initiation at any location; Pyriform sinuses  Pharyngeal Impairment Domain: Pharyngeal Impairment Domain Soft palate elevation: No bolus between soft palate (SP)/pharyngeal wall (PW) (while no bolus was observed in nasopharynx, pt with severey hypernasal speech and very little soft palate activation/elevation) Laryngeal elevation: Minimal superior movement of thyroid  cartilage with minimal approximation of arytenoids to epiglottic petiole; No superior movement of thyroid  cartilage Anterior hyoid excursion: Partial anterior movement; No anterior movement Epiglottic movement: No inversion; Partial inversion Laryngeal vestibule closure: None, wide column air/contrast in laryngeal vestibule; Incomplete, narrow column air/contrast in laryngeal vestibule Pharyngeal stripping wave : Present - diminished Pharyngoesophageal segment opening: No distension with total obstruction of flow; Minimal  distention/minimal duration, marked obstruction of flow Tongue base retraction: Wide column of contrast or air between tongue base and PPW Pharyngeal residue: Minimal to no pharyngeal clearance; Majority of contrast within or on pharyngeal structures Location of pharyngeal residue: Tongue base; Valleculae; Pharyngeal wall; Pyriform sinuses; Aryepiglottic folds  Esophageal Impairment Domain: No data recorded Pill: No data recorded Penetration/Aspiration Scale Score: Penetration/Aspiration Scale Score 7.  Material enters airway, passes BELOW cords and not ejected out despite cough attempt by patient: Mildly thick liquids (Level 2, nectar thick); Puree 8.  Material enters airway, passes BELOW cords without attempt by patient to eject out (silent aspiration) : Mildly thick liquids (Level 2, nectar thick); Puree Compensatory Strategies: Compensatory Strategies Compensatory strategies: Yes Effortful swallow: Ineffective Ineffective Effortful Swallow: Mildly thick liquid (Level 2, nectar thick); Puree Multiple swallows: Ineffective Ineffective Multiple Swallows: Mildly thick liquid (Level 2, nectar thick); Puree Chin tuck: Ineffective Ineffective Chin Tuck: Mildly thick liquid (Level 2, nectar thick); Puree Left head turn: Ineffective Ineffective Left Head Turn: Mildly thick liquid (Level 2, nectar thick); Puree   General Information: Caregiver present: No  Diet Prior to this Study: NPO   Temperature : Normal   Respiratory Status: WFL   Supplemental O2: None (Room air)   History of Recent Intubation: Yes  Behavior/Cognition: Alert; Cooperative (< 25% speech intelligibility) Self-Feeding Abilities: Needs set-up for self-feeding; Needs assist with self-feeding; Needs hand-over-hand assist for feeding; Dependent for feeding Baseline vocal quality/speech: Aphonic; Hypophonia/low volume; Abnormal resonance; Dysphonic Volitional Cough: Able to elicit Volitional Swallow: Unable to elicit Exam Limitations: No limitations Goal  Planning: Prognosis for improved oropharyngeal function: Guarded Barriers to Reach Goals: Cognitive deficits; Time post onset; Severity of deficits; Overall medical prognosis No data recorded Patient/Family Stated Goal: what were the results Consulted and agree with results and recommendations:  Patient; Physician; Nurse (wife coming in around 12pm this wirter to visit with wife to review the results) Pain: Pain Assessment Pain Assessment: No/denies pain End of Session: Start Time:SLP Start Time (ACUTE ONLY): 0745 Stop Time: SLP Stop Time (ACUTE ONLY): 0805 Time Calculation:SLP Time Calculation (min) (ACUTE ONLY): 20 min Charges: SLP Evaluations $ SLP Speech Visit: 1 Visit SLP Evaluations $MBS Swallow: 1 Procedure $Swallowing Treatment: 1 Procedure SLP visit diagnosis: SLP Visit Diagnosis: Dysphagia, oropharyngeal phase (R13.12) Past Medical History: Past Medical History: Diagnosis Date  AF (atrial fibrillation) (HCC)   B12 deficiency   Coronary artery disease   DH (dermatitis herpetiformis)   Dysphagia   Hypertension   Presence of permanent cardiac pacemaker   2006  Primary parkinsonism [facility])   Prostate cancer (HCC) 2014 Past Surgical History: Past Surgical History: Procedure Laterality Date  APPENDECTOMY    CARDIAC CATHETERIZATION    CATARACT EXTRACTION W/PHACO Left 05/04/2020  Procedure: CATARACT EXTRACTION PHACO AND INTRAOCULAR LENS PLACEMENT (IOC) LEFT  6.76 00:59.9 11.3%;  Surgeon: Mittie Gaskin, MD;  Location: [facility];  Service: Ophthalmology;  Laterality: Left;  CATARACT EXTRACTION W/PHACO Right 05/18/2020  Procedure: CATARACT EXTRACTION PHACO AND INTRAOCULAR LENS PLACEMENT (IOC) RIGHT 15.56 01:52.6 13.8%;  Surgeon: Mittie Gaskin, MD;  Location: [facility];  Service: Ophthalmology;  Laterality: Right;  COLONOSCOPY  2008, 2014  CYSTOSCOPY WITH LITHOLAPAXY N/A 09/17/2019  Procedure: CYSTOSCOPY WITH LITHOLAPAXY;  Surgeon: Kassie Ozell SAUNDERS, MD;  Location: [facility];  Service:  Urology;  Laterality: N/A;  ESOPHAGOGASTRODUODENOSCOPY (EGD) WITH PROPOFOL  N/A 10/13/2015  Procedure: ESOPHAGOGASTRODUODENOSCOPY (EGD) WITH PROPOFOL ;  Surgeon: Gladis RAYMOND Mariner, MD;  Location: [facility];  Service: Endoscopy;  Laterality: N/A;  FASCIOTOMY Left 01/30/2019  Procedure: I&D BELOW FASCIA FOOT SINGLE LEFT;  Surgeon: Ashley Soulier, DPM;  Location: [facility];  Service: Podiatry;  Laterality: Left;  GREEN LIGHT LASER TURP (TRANSURETHRAL RESECTION OF PROSTATE N/A 09/17/2019  Procedure: GREEN LIGHT LASER TURP (TRANSURETHRAL RESECTION OF PROSTATE;  Surgeon: Kassie Ozell SAUNDERS, MD;  Location: [facility];  Service: Urology;  Laterality: N/A;  HIP ARTHROPLASTY Left 04/22/2023  Procedure: HEMIARTHROPLASTY (BIPOLAR) HIP, POSTERIOR APPROACH FOR FRACTURE;  Surgeon: Edie Norleen PARAS, MD;  Location: [facility];  Service: Orthopedics;  Laterality: Left;  PACEMAKER PLACEMENT  2006 Happi Overton 10/17/2023, 8:59 AM  DG Chest Port 1 View Result Date: 10/13/2023 CLINICAL DATA:  Post bronchoscopy EXAM: PORTABLE CHEST 1 VIEW COMPARISON:  Chest x-ray 10/13/2023 FINDINGS: Endotracheal tube tip is 2 cm above the carina. Enteric tube tip is in the mid stomach. Left-sided pacemaker again seen. The heart is enlarged. There some mild patchy airspace opacities in the left upper lobe. The lungs are otherwise clear. No pleural effusion or pneumothorax. IMPRESSION: 1. Endotracheal tube tip is 2 cm above the carina. 2. Mild patchy airspace opacities in the left upper lobe. Electronically Signed   By: Greig Pique M.D.   On: 10/13/2023 19:42   DG Chest Port 1 View Result Date: 10/13/2023 CLINICAL DATA:  Endotracheal and OG tube placement verification. EXAM: PORTABLE CHEST 1 VIEW, ABDOMEN ONE VIEW COMPARISON:  06/13/2023. FINDINGS: The heart size and mediastinal contours are stable. There is atherosclerotic calcification of the aorta. No consolidation, effusion, or pneumothorax is seen. A multi lead pacemaker device is present over the left  chest. An endotracheal tube is at the carina and should be retracted approximately 3 cm at. A nonobstructive bowel gas pattern is noted in the upper abdomen. Enteric tube terminates in the stomach and appears appropriate in position. IMPRESSION:  1. Endotracheal tube is at the carina and should be retracted approximately 3 cm. 2. Orogastric tube in appropriate position. Electronically Signed   By: Leita Birmingham M.D.   On: 10/13/2023 18:53   DG Abd 1 View Result Date: 10/13/2023 CLINICAL DATA:  Endotracheal and OG tube placement verification. EXAM: PORTABLE CHEST 1 VIEW, ABDOMEN ONE VIEW COMPARISON:  06/13/2023. FINDINGS: The heart size and mediastinal contours are stable. There is atherosclerotic calcification of the aorta. No consolidation, effusion, or pneumothorax is seen. A multi lead pacemaker device is present over the left chest. An endotracheal tube is at the carina and should be retracted approximately 3 cm at. A nonobstructive bowel gas pattern is noted in the upper abdomen. Enteric tube terminates in the stomach and appears appropriate in position. IMPRESSION: 1. Endotracheal tube is at the carina and should be retracted approximately 3 cm. 2. Orogastric tube in appropriate position. Electronically Signed   By: Leita Birmingham M.D.   On: 10/13/2023 18:53    Microbiology: Recent Results (from the past 240 hours)  Urine Culture     Status: Abnormal   Collection Time: 10/13/23  6:30 PM   Specimen: Urine, Random  Result Value Ref Range Status   Specimen Description   Final    URINE, RANDOM Performed at [facility], [address] [place], [place] [address]    Special Requests   Final    NONE Reflexed from [phone] Performed at [facility], [address] [place], [place] [address]    Culture >=100,000 COLONIES/mL ESCHERICHIA COLI (A)  Final   Report Status 10/15/2023 FINAL  Final   Organism ID, Bacteria ESCHERICHIA COLI (A)  Final      Susceptibility   Escherichia  coli - MIC*    AMPICILLIN >=32 RESISTANT Resistant     CEFAZOLIN  (URINE) Value in next row Sensitive      4 SENSITIVEThis is a modified FDA-approved test that has been validated and its performance characteristics determined by the reporting laboratory.  This laboratory is certified under the Clinical Laboratory Improvement Amendments CLIA as qualified to perform high complexity clinical laboratory testing.    CEFEPIME Value in next row Sensitive      4 SENSITIVEThis is a modified FDA-approved test that has been validated and its performance characteristics determined by the reporting laboratory.  This laboratory is certified under the Clinical Laboratory Improvement Amendments CLIA as qualified to perform high complexity clinical laboratory testing.    ERTAPENEM Value in next row Sensitive      4 SENSITIVEThis is a modified FDA-approved test that has been validated and its performance characteristics determined by the reporting laboratory.  This laboratory is certified under the Clinical Laboratory Improvement Amendments CLIA as qualified to perform high complexity clinical laboratory testing.    CEFTRIAXONE  Value in next row Sensitive      4 SENSITIVEThis is a modified FDA-approved test that has been validated and its performance characteristics determined by the reporting laboratory.  This laboratory is certified under the Clinical Laboratory Improvement Amendments CLIA as qualified to perform high complexity clinical laboratory testing.    CIPROFLOXACIN Value in next row Sensitive      4 SENSITIVEThis is a modified FDA-approved test that has been validated and its performance characteristics determined by the reporting laboratory.  This laboratory is certified under the Clinical Laboratory Improvement Amendments CLIA as qualified to perform high complexity clinical laboratory testing.    GENTAMICIN  Value in next row Sensitive      4  SENSITIVEThis is a modified FDA-approved test that has been  validated and its performance characteristics determined by the reporting laboratory.  This laboratory is certified under the Clinical Laboratory Improvement Amendments CLIA as qualified to perform high complexity clinical laboratory testing.    NITROFURANTOIN Value in next row Sensitive      4 SENSITIVEThis is a modified FDA-approved test that has been validated and its performance characteristics determined by the reporting laboratory.  This laboratory is certified under the Clinical Laboratory Improvement Amendments CLIA as qualified to perform high complexity clinical laboratory testing.    TRIMETH /SULFA  Value in next row Sensitive      4 SENSITIVEThis is a modified FDA-approved test that has been validated and its performance characteristics determined by the reporting laboratory.  This laboratory is certified under the Clinical Laboratory Improvement Amendments CLIA as qualified to perform high complexity clinical laboratory testing.    AMPICILLIN/SULBACTAM Value in next row Intermediate      4 SENSITIVEThis is a modified FDA-approved test that has been validated and its performance characteristics determined by the reporting laboratory.  This laboratory is certified under the Clinical Laboratory Improvement Amendments CLIA as qualified to perform high complexity clinical laboratory testing.    PIP/TAZO Value in next row Sensitive      <=4 SENSITIVEThis is a modified FDA-approved test that has been validated and its performance characteristics determined by the reporting laboratory.  This laboratory is certified under the Clinical Laboratory Improvement Amendments CLIA as qualified to perform high complexity clinical laboratory testing.    MEROPENEM Value in next row Sensitive      <=4 SENSITIVEThis is a modified FDA-approved test that has been validated and its performance characteristics determined by the reporting laboratory.  This laboratory is certified under the Clinical Laboratory Improvement  Amendments CLIA as qualified to perform high complexity clinical laboratory testing.    * >=100,000 COLONIES/mL ESCHERICHIA COLI  MRSA Next Gen by PCR, Nasal     Status: None   Collection Time: 10/13/23  8:00 PM   Specimen: Nasal Mucosa; Nasal Swab  Result Value Ref Range Status   MRSA by PCR Next Gen NOT DETECTED NOT DETECTED Final    Comment: (NOTE) The GeneXpert MRSA Assay (FDA approved for NASAL specimens only), is one component of a comprehensive MRSA colonization surveillance program. It is not intended to diagnose MRSA infection nor to guide or monitor treatment for MRSA infections. Test performance is not FDA approved in patients less than 23 years old. Performed at [facility], [address] [place], [place] [address]      Labs: Basic Metabolic Panel: Recent Labs  Lab 10/14/23 0302 10/15/23 0453 10/16/23 0522 10/17/23 0514 10/18/23 0444  NA 134* 139 140 140 141  K 4.1 3.6 3.7 3.6 3.4*  CL 105 105 107 106 107  CO2 22 25 23 23 24   GLUCOSE 131* 100* 106* 106* 100*  BUN 35* 30* 26* 23 22  CREATININE 0.86 0.95 0.79 0.80 0.69  CALCIUM 8.6* 8.8* 8.9 9.0 8.8*  MG 2.0 1.9 2.0  --   --   PHOS 3.0 2.9 2.6  --   --    Liver Function Tests: Recent Labs  Lab 10/13/23 1828  AST 51*  ALT 14  ALKPHOS 93  BILITOT 0.9  PROT 7.5  ALBUMIN 3.9   No results for input(s): LIPASE, AMYLASE in the last 168 hours. No results for input(s): AMMONIA in the last 168 hours. CBC: Recent Labs  Lab 10/13/23 1828 10/14/23 0302  10/15/23 0453 10/16/23 0522  WBC 10.1 14.1* 9.7 8.6  HGB 14.6 12.7* 12.0* 13.4  HCT 44.0 38.5* 36.1* 39.0  MCV 88.0 89.1 87.6 86.1  PLT 279 208 215 232   Cardiac Enzymes: No results for input(s): CKTOTAL, CKMB, CKMBINDEX, TROPONINI in the last 168 hours. BNP: BNP (last 3 results) No results for input(s): BNP in the last 8760 hours.  ProBNP (last 3 results) No results for input(s): PROBNP in the last 8760  hours.  CBG: Recent Labs  Lab 10/16/23 2018 10/17/23 0032 10/17/23 0411 10/17/23 0735 10/17/23 1143  GLUCAP 129* 98 100* 108* 103*       Signed:  Devaughn KATHEE Ban MD.  [facility] 10/19/2023, 9:50 AM

## 2023-10-22 DIAGNOSIS — S72002D Fracture of unspecified part of neck of left femur, subsequent encounter for closed fracture with routine healing: Secondary | ICD-10-CM | POA: Diagnosis not present

## 2023-10-22 DIAGNOSIS — L89322 Pressure ulcer of left buttock, stage 2: Secondary | ICD-10-CM | POA: Diagnosis not present

## 2023-10-22 DIAGNOSIS — I2510 Atherosclerotic heart disease of native coronary artery without angina pectoris: Secondary | ICD-10-CM | POA: Diagnosis not present

## 2023-10-22 DIAGNOSIS — I4891 Unspecified atrial fibrillation: Secondary | ICD-10-CM | POA: Diagnosis not present

## 2023-10-22 DIAGNOSIS — E538 Deficiency of other specified B group vitamins: Secondary | ICD-10-CM | POA: Diagnosis not present

## 2023-10-22 DIAGNOSIS — G20A1 Parkinson's disease without dyskinesia, without mention of fluctuations: Secondary | ICD-10-CM | POA: Diagnosis not present

## 2023-10-22 DIAGNOSIS — I10 Essential (primary) hypertension: Secondary | ICD-10-CM | POA: Diagnosis not present

## 2023-11-20 DIAGNOSIS — M24562 Contracture, left knee: Secondary | ICD-10-CM | POA: Diagnosis not present

## 2023-11-20 DIAGNOSIS — Z96642 Presence of left artificial hip joint: Secondary | ICD-10-CM | POA: Diagnosis not present

## 2023-11-20 DIAGNOSIS — G20C Parkinsonism, unspecified: Secondary | ICD-10-CM | POA: Diagnosis not present

## 2023-11-20 DIAGNOSIS — S72032S Displaced midcervical fracture of left femur, sequela: Secondary | ICD-10-CM | POA: Diagnosis not present

## 2023-12-11 ENCOUNTER — Other Ambulatory Visit: Payer: Self-pay

## 2023-12-11 ENCOUNTER — Inpatient Hospital Stay
Admission: EM | Admit: 2023-12-11 | Discharge: 2023-12-13 | DRG: 871 | Disposition: A | Discharge status: Hospice | Attending: Internal Medicine | Admitting: Internal Medicine

## 2023-12-11 ENCOUNTER — Emergency Department

## 2023-12-11 DIAGNOSIS — L89153 Pressure ulcer of sacral region, stage 3: Secondary | ICD-10-CM | POA: Diagnosis present

## 2023-12-11 DIAGNOSIS — I4821 Permanent atrial fibrillation: Secondary | ICD-10-CM | POA: Diagnosis present

## 2023-12-11 DIAGNOSIS — Z950 Presence of cardiac pacemaker: Secondary | ICD-10-CM

## 2023-12-11 DIAGNOSIS — I96 Gangrene, not elsewhere classified: Secondary | ICD-10-CM | POA: Diagnosis not present

## 2023-12-11 DIAGNOSIS — I2510 Atherosclerotic heart disease of native coronary artery without angina pectoris: Secondary | ICD-10-CM | POA: Diagnosis present

## 2023-12-11 DIAGNOSIS — Z885 Allergy status to narcotic agent status: Secondary | ICD-10-CM

## 2023-12-11 DIAGNOSIS — Z881 Allergy status to other antibiotic agents status: Secondary | ICD-10-CM

## 2023-12-11 DIAGNOSIS — E872 Acidosis, unspecified: Secondary | ICD-10-CM | POA: Diagnosis present

## 2023-12-11 DIAGNOSIS — G20A1 Parkinson's disease without dyskinesia, without mention of fluctuations: Secondary | ICD-10-CM | POA: Diagnosis present

## 2023-12-11 DIAGNOSIS — Z8249 Family history of ischemic heart disease and other diseases of the circulatory system: Secondary | ICD-10-CM | POA: Diagnosis not present

## 2023-12-11 DIAGNOSIS — R140 Abdominal distension (gaseous): Secondary | ICD-10-CM | POA: Diagnosis not present

## 2023-12-11 DIAGNOSIS — Z743 Need for continuous supervision: Secondary | ICD-10-CM | POA: Diagnosis not present

## 2023-12-11 DIAGNOSIS — R000 Tachycardia, unspecified: Secondary | ICD-10-CM | POA: Diagnosis not present

## 2023-12-11 DIAGNOSIS — M7732 Calcaneal spur, left foot: Secondary | ICD-10-CM | POA: Diagnosis not present

## 2023-12-11 DIAGNOSIS — I10 Essential (primary) hypertension: Secondary | ICD-10-CM | POA: Diagnosis present

## 2023-12-11 DIAGNOSIS — R54 Age-related physical debility: Secondary | ICD-10-CM | POA: Diagnosis not present

## 2023-12-11 DIAGNOSIS — Z1152 Encounter for screening for COVID-19: Secondary | ICD-10-CM

## 2023-12-11 DIAGNOSIS — Z8546 Personal history of malignant neoplasm of prostate: Secondary | ICD-10-CM

## 2023-12-11 DIAGNOSIS — N390 Urinary tract infection, site not specified: Secondary | ICD-10-CM | POA: Diagnosis present

## 2023-12-11 DIAGNOSIS — Z66 Do not resuscitate: Secondary | ICD-10-CM | POA: Diagnosis present

## 2023-12-11 DIAGNOSIS — I700 Atherosclerosis of aorta: Secondary | ICD-10-CM | POA: Diagnosis not present

## 2023-12-11 DIAGNOSIS — L97429 Non-pressure chronic ulcer of left heel and midfoot with unspecified severity: Secondary | ICD-10-CM | POA: Diagnosis present

## 2023-12-11 DIAGNOSIS — R6520 Severe sepsis without septic shock: Secondary | ICD-10-CM | POA: Diagnosis present

## 2023-12-11 DIAGNOSIS — N179 Acute kidney failure, unspecified: Secondary | ICD-10-CM | POA: Diagnosis present

## 2023-12-11 DIAGNOSIS — Z515 Encounter for palliative care: Secondary | ICD-10-CM

## 2023-12-11 DIAGNOSIS — I495 Sick sinus syndrome: Secondary | ICD-10-CM | POA: Diagnosis present

## 2023-12-11 DIAGNOSIS — A419 Sepsis, unspecified organism: Principal | ICD-10-CM | POA: Diagnosis present

## 2023-12-11 DIAGNOSIS — I4891 Unspecified atrial fibrillation: Secondary | ICD-10-CM | POA: Diagnosis present

## 2023-12-11 DIAGNOSIS — R0989 Other specified symptoms and signs involving the circulatory and respiratory systems: Secondary | ICD-10-CM | POA: Diagnosis not present

## 2023-12-11 DIAGNOSIS — Z7189 Other specified counseling: Secondary | ICD-10-CM

## 2023-12-11 DIAGNOSIS — Z9079 Acquired absence of other genital organ(s): Secondary | ICD-10-CM

## 2023-12-11 DIAGNOSIS — I482 Chronic atrial fibrillation, unspecified: Secondary | ICD-10-CM

## 2023-12-11 DIAGNOSIS — R509 Fever, unspecified: Secondary | ICD-10-CM | POA: Diagnosis not present

## 2023-12-11 DIAGNOSIS — Z0389 Encounter for observation for other suspected diseases and conditions ruled out: Secondary | ICD-10-CM | POA: Diagnosis not present

## 2023-12-11 DIAGNOSIS — Z96642 Presence of left artificial hip joint: Secondary | ICD-10-CM | POA: Diagnosis present

## 2023-12-11 DIAGNOSIS — L97529 Non-pressure chronic ulcer of other part of left foot with unspecified severity: Secondary | ICD-10-CM | POA: Diagnosis present

## 2023-12-11 DIAGNOSIS — R531 Weakness: Secondary | ICD-10-CM | POA: Diagnosis not present

## 2023-12-11 LAB — COMPREHENSIVE METABOLIC PANEL WITH GFR
ALT: <5 U/L (ref 0–44)
AST: 32 U/L (ref 15–41)
Albumin: 3.7 g/dL (ref 3.5–5.0)
Alkaline Phosphatase: 128 U/L — ABNORMAL HIGH (ref 38–126)
Anion gap: 13 (ref 5–15)
BUN: 35 mg/dL — ABNORMAL HIGH (ref 8–23)
CO2: 23 mmol/L (ref 22–32)
Calcium: 9.5 mg/dL (ref 8.9–10.3)
Chloride: 100 mmol/L (ref 98–111)
Creatinine, Ser: 1.71 mg/dL — ABNORMAL HIGH (ref 0.61–1.24)
GFR, Estimated: 39 mL/min — ABNORMAL LOW (ref >60)
Glucose, Bld: 124 mg/dL — ABNORMAL HIGH (ref 70–99)
Potassium: 4.9 mmol/L (ref 3.5–5.1)
Sodium: 136 mmol/L (ref 135–145)
Total Bilirubin: 0.7 mg/dL (ref 0.0–1.2)
Total Protein: 7.7 g/dL (ref 6.5–8.1)

## 2023-12-11 LAB — CBC WITH DIFFERENTIAL/PLATELET
Abs Immature Granulocytes: 0.05 K/uL (ref 0.00–0.07)
Basophils Absolute: 0 K/uL (ref 0.0–0.1)
Basophils Relative: 0 %
Eosinophils Absolute: 0 K/uL (ref 0.0–0.5)
Eosinophils Relative: 0 %
HCT: 34.4 % — ABNORMAL LOW (ref 39.0–52.0)
Hemoglobin: 11.4 g/dL — ABNORMAL LOW (ref 13.0–17.0)
Immature Granulocytes: 0 %
Lymphocytes Relative: 3 %
Lymphs Abs: 0.4 K/uL — ABNORMAL LOW (ref 0.7–4.0)
MCH: 29.5 pg (ref 26.0–34.0)
MCHC: 33.1 g/dL (ref 30.0–36.0)
MCV: 88.9 fL (ref 80.0–100.0)
Monocytes Absolute: 0.8 K/uL (ref 0.1–1.0)
Monocytes Relative: 6 %
Neutro Abs: 10.9 K/uL — ABNORMAL HIGH (ref 1.7–7.7)
Neutrophils Relative %: 91 %
Platelets: 375 K/uL (ref 150–400)
RBC: 3.87 MIL/uL — ABNORMAL LOW (ref 4.22–5.81)
RDW: 13.7 % (ref 11.5–15.5)
WBC: 12.2 K/uL — ABNORMAL HIGH (ref 4.0–10.5)
nRBC: 0 % (ref 0.0–0.2)

## 2023-12-11 LAB — PROTIME-INR
INR: 1.1 (ref 0.8–1.2)
Prothrombin Time: 15.3 s — ABNORMAL HIGH (ref 11.4–15.2)

## 2023-12-11 LAB — RESP PANEL BY RT-PCR (RSV, FLU A&B, COVID)  RVPGX2
Influenza A by PCR: NEGATIVE
Influenza B by PCR: NEGATIVE
Resp Syncytial Virus by PCR: NEGATIVE
SARS Coronavirus 2 by RT PCR: NEGATIVE

## 2023-12-11 LAB — LACTIC ACID, PLASMA: Lactic Acid, Venous: 2.7 mmol/L (ref 0.5–1.9)

## 2023-12-11 MED ORDER — VANCOMYCIN HCL 1500 MG/300ML IV SOLN
1500.0000 mg | Freq: Once | INTRAVENOUS | Status: AC
  Administered 2023-12-12: 1500 mg via INTRAVENOUS
  Filled 2023-12-11: qty 300

## 2023-12-11 MED ORDER — PIPERACILLIN-TAZOBACTAM 3.375 G IVPB 30 MIN
3.3750 g | Freq: Once | INTRAVENOUS | Status: AC
  Administered 2023-12-11: 3.375 g via INTRAVENOUS
  Filled 2023-12-11 (×2): qty 50

## 2023-12-11 MED ORDER — SODIUM CHLORIDE 0.9 % IV BOLUS (SEPSIS)
1000.0000 mL | Freq: Once | INTRAVENOUS | Status: AC
  Administered 2023-12-11: 1000 mL via INTRAVENOUS

## 2023-12-11 NOTE — ED Provider Notes (Incomplete)
 [facility] Provider Note    Event Date/Time   First MD Initiated Contact with Patient 12/11/23 2145     (approximate)   History   Weakness and Wound Infection   HPI  Daniel [name] is a 83 y.o. male with history of permanent atrial fibrillation, sick sinus syndrome status post pacemaker, Parkinson's disease who comes in with concerns for infection on the left foot.  Patient has an infection in the left toes.  He has been on antibiotics for a few weeks.  However today he was more weak with low blood pressures elevated heart rates and temperature of 101.8.  Patient given Tylenol  and fluids on route.  Wife at bedside denies any known falls.  She reports that he just seems more generalized weak.  She denies any chest pain, abdominal pain, urinary symptoms that he has reported or any other concerns.     Physical Exam   Triage Vital Signs: ED Triage Vitals  Encounter Vitals Group     BP 12/11/23 2144 (!) 141/76     Girls Systolic BP Percentile --      Girls Diastolic BP Percentile --      Boys Systolic BP Percentile --      Boys Diastolic BP Percentile --      Pulse Rate 12/11/23 2144 (!) 135     Resp 12/11/23 2144 20     Temp 12/11/23 2144 99.8 F (37.7 C)     Temp Source 12/11/23 2144 Oral     SpO2 12/11/23 2144 100 %     Weight 12/11/23 2145 141 lb 5 oz (64.1 kg)     Height 12/11/23 2145 5' 10 (1.778 m)     Head Circumference --      Peak Flow --      Pain Score 12/11/23 2144 4     Pain Loc --      Pain Education --      Exclude from Growth Chart --     Most recent vital signs: Vitals:   12/11/23 2144  BP: (!) 141/76  Pulse: (!) 135  Resp: 20  Temp: 99.8 F (37.7 C)  SpO2: 100%     General: Awake, no distress.  CV:  Good peripheral perfusion.  Resp:  Normal effort.  Abd:  No distention.  Soft and nontender Other:  Redness and warmth noted to the left foot with ulcerations of the heel and necrotic toes x2 on the big toe and second  toe.  Pulses able to be palpated. Moving all extremities,  ED Results / Procedures / Treatments   Labs (all labs ordered are listed, but only abnormal results are displayed) Labs Reviewed  LACTIC ACID, PLASMA - Abnormal; Notable for the following components:      Result Value   Lactic Acid, Venous 2.7 (*)    All other components within normal limits  COMPREHENSIVE METABOLIC PANEL WITH GFR - Abnormal; Notable for the following components:   Glucose, Bld 124 (*)    BUN 35 (*)    Creatinine, Ser 1.71 (*)    Alkaline Phosphatase 128 (*)    GFR, Estimated 39 (*)    All other components within normal limits  CBC WITH DIFFERENTIAL/PLATELET - Abnormal; Notable for the following components:   WBC 12.2 (*)    RBC 3.87 (*)    Hemoglobin 11.4 (*)    HCT 34.4 (*)    Neutro Abs 10.9 (*)    Lymphs Abs 0.4 (*)  All other components within normal limits  PROTIME-INR - Abnormal; Notable for the following components:   Prothrombin Time 15.3 (*)    All other components within normal limits  RESP PANEL BY RT-PCR (RSV, FLU A&B, COVID)  [id number]  CULTURE, BLOOD (ROUTINE X 2)  CULTURE, BLOOD (ROUTINE X 2)  LACTIC ACID, PLASMA  URINALYSIS, W/ REFLEX TO CULTURE (INFECTION SUSPECTED)     EKG  My interpretation of EKG:  Atrial fibrillation with a rate 126 ST elevation, T wave versions in 2 3 aVF, normal intervals  RADIOLOGY I have reviewed the xray personally and interpreted no evidence of any fracture   PROCEDURES:  Critical Care performed: Yes, see critical care procedure note(s)  .1-3 Lead EKG Interpretation  Performed by: Ernest Ronal BRAVO, MD Authorized by: Ernest Ronal BRAVO, MD     Interpretation: abnormal     ECG rate:  115   ECG rate assessment: tachycardic     Rhythm: atrial fibrillation     Ectopy: none     Conduction: normal      MEDICATIONS ORDERED IN ED: Medications  vancomycin (VANCOREADY) IVPB 1500 mg/300 mL (has no administration in time range)  sodium chloride  0.9 %  bolus 1,000 mL (1,000 mLs Intravenous New Bag/Given 12/11/23 2222)    And  sodium chloride  0.9 % bolus 1,000 mL (1,000 mLs Intravenous New Bag/Given 12/11/23 2222)  piperacillin-tazobactam (ZOSYN) IVPB 3.375 g (3.375 g Intravenous New Bag/Given 12/11/23 2221)     IMPRESSION / MDM / ASSESSMENT AND PLAN / ED COURSE  I reviewed the triage vital signs and the nursing notes.   Patient's presentation is most consistent with acute presentation with potential threat to life or bodily function.   Patient comes in with concerns for necrotic toes, sepsis.  Broad-spectrum antibiotics were started.  He denies any new abdominal pain, urinary symptoms suggest UTI, kidney stone, no cough or shortness of breath to suggest pneumonia.  According to wife who is at bedside the only big change has been the foot.  This is concerning as he does have necrotic toes but he does have a pulse.  He will need admission for podiatry consultation, MRI of the foot and further workup of this.  They expressed understanding and felt comfortable with this plan.  CBC is elevated with a white count of 12.  COVID was negative.  Lactate was elevated patient getting full fluid resuscitation patient has AKI.  Patient's heart rates have come down in the 90s.  The patient is on the cardiac monitor to evaluate for evidence of arrhythmia and/or significant heart rate changes.  Clinical Course as of 12/11/23 2357  Wed Dec 11, 2023  2259 DG Chest [place] 1 View [MF]    Clinical Course User Index [MF] Ernest Ronal BRAVO, MD     FINAL CLINICAL IMPRESSION(S) / ED DIAGNOSES   Final diagnoses:  Necrotic toes (HCC)  Sepsis, due to unspecified organism, unspecified whether acute organ dysfunction present (HCC)  Chronic a-fib (HCC)     Rx / DC Orders   ED Discharge Orders     None        Note:  This document was prepared using Dragon voice recognition software and may include unintentional dictation errors.   Ernest Ronal BRAVO,  MD 12/12/23 ULYSSES    Ernest Ronal BRAVO, MD 12/12/23 [phone]

## 2023-12-11 NOTE — ED Notes (Signed)
Pt provided apple sauce

## 2023-12-11 NOTE — Progress Notes (Signed)
 CODE SEPSIS - PHARMACY COMMUNICATION  **Broad Spectrum Antibiotics should be administered within 1 hour of Sepsis diagnosis**  Time Code Sepsis Called/Page Received: 11/19 @ 2158   Antibiotics Ordered: Zosyn , Vanc  Time of 1st antibiotic administration: Zosyn 3.375 gm IV X 1 on 11/19 @ 2221  Additional action taken by pharmacy:   If necessary, Name of Provider/Nurse Contacted:     Elisandra Deshmukh D ,PharmD Clinical Pharmacist  12/11/2023  10:26 PM

## 2023-12-11 NOTE — ED Triage Notes (Signed)
 Pt to ED by [facility] from home. EMS was called d/t weakness, pt was unable to get off the the toilet himself.  Pt has lower body weakness which is baseline, per wife pt has become more confused than baseline.  BP 85/59 initial for EMS, improved to 127/60 after IVF, HR 62-170 for EMS, 99% on room air. 101.43F axillary temp for EMS. Pt has wound infection on left heel and big toe, has been on antibiotics for a few weeks.  1G tylenol  given IV by EMS, 500mL LR given.

## 2023-12-11 NOTE — Sepsis Progress Note (Signed)
 Blood cultures not showing as in process.  Verified with the bedside RN that blood cultures were collected prior to initiation of antibiotics.

## 2023-12-11 NOTE — Progress Notes (Signed)
 ED Pharmacy Antibiotic Sign Off An antibiotic consult was received from an ED provider for Vancomycin  per pharmacy dosing for sepsis. A chart review was completed to assess appropriateness.   The following one time order(s) were placed:  Vancomycin  1500 mg IV X 1   Further antibiotic and/or antibiotic pharmacy consults should be ordered by the admitting provider if indicated.   Thank you for allowing pharmacy to be a part of this patient's care.   Silver Selinda BIRCH [facility]  Clinical Pharmacist 12/11/23 10:04 PM

## 2023-12-11 NOTE — Sepsis Progress Note (Signed)
 Elink monitoring for the code sepsis protocol.

## 2023-12-12 DIAGNOSIS — I482 Chronic atrial fibrillation, unspecified: Secondary | ICD-10-CM | POA: Diagnosis not present

## 2023-12-12 DIAGNOSIS — I96 Gangrene, not elsewhere classified: Principal | ICD-10-CM

## 2023-12-12 DIAGNOSIS — R6520 Severe sepsis without septic shock: Secondary | ICD-10-CM

## 2023-12-12 DIAGNOSIS — Z515 Encounter for palliative care: Secondary | ICD-10-CM

## 2023-12-12 DIAGNOSIS — N179 Acute kidney failure, unspecified: Secondary | ICD-10-CM

## 2023-12-12 DIAGNOSIS — G20A1 Parkinson's disease without dyskinesia, without mention of fluctuations: Secondary | ICD-10-CM

## 2023-12-12 DIAGNOSIS — A419 Sepsis, unspecified organism: Secondary | ICD-10-CM | POA: Diagnosis not present

## 2023-12-12 DIAGNOSIS — I4891 Unspecified atrial fibrillation: Secondary | ICD-10-CM

## 2023-12-12 DIAGNOSIS — Z7189 Other specified counseling: Secondary | ICD-10-CM

## 2023-12-12 DIAGNOSIS — Z950 Presence of cardiac pacemaker: Secondary | ICD-10-CM

## 2023-12-12 DIAGNOSIS — R54 Age-related physical debility: Secondary | ICD-10-CM | POA: Diagnosis not present

## 2023-12-12 LAB — URINALYSIS, W/ REFLEX TO CULTURE (INFECTION SUSPECTED)
Bilirubin Urine: NEGATIVE
Glucose, UA: NEGATIVE mg/dL
Ketones, ur: NEGATIVE mg/dL
Nitrite: NEGATIVE
Protein, ur: NEGATIVE mg/dL
RBC / HPF: >50 RBC/hpf (ref 0–5)
Specific Gravity, Urine: 1.017 (ref 1.005–1.030)
Squamous Epithelial / HPF: 0 /HPF (ref 0–5)
pH: 5 (ref 5.0–8.0)

## 2023-12-12 LAB — BASIC METABOLIC PANEL WITH GFR
Anion gap: 13 (ref 5–15)
BUN: 31 mg/dL — ABNORMAL HIGH (ref 8–23)
CO2: 21 mmol/L — ABNORMAL LOW (ref 22–32)
Calcium: 8.6 mg/dL — ABNORMAL LOW (ref 8.9–10.3)
Chloride: 105 mmol/L (ref 98–111)
Creatinine, Ser: 1.45 mg/dL — ABNORMAL HIGH (ref 0.61–1.24)
GFR, Estimated: 48 mL/min — ABNORMAL LOW (ref >60)
Glucose, Bld: 112 mg/dL — ABNORMAL HIGH (ref 70–99)
Potassium: 4.2 mmol/L (ref 3.5–5.1)
Sodium: 139 mmol/L (ref 135–145)

## 2023-12-12 LAB — C DIFFICILE QUICK SCREEN W PCR REFLEX
C Diff antigen: NEGATIVE
C Diff interpretation: NOT DETECTED
C Diff toxin: NEGATIVE

## 2023-12-12 LAB — LACTIC ACID, PLASMA: Lactic Acid, Venous: 2 mmol/L (ref 0.5–1.9)

## 2023-12-12 LAB — CBC
HCT: 29.2 % — ABNORMAL LOW (ref 39.0–52.0)
Hemoglobin: 9.7 g/dL — ABNORMAL LOW (ref 13.0–17.0)
MCH: 29.4 pg (ref 26.0–34.0)
MCHC: 33.2 g/dL (ref 30.0–36.0)
MCV: 88.5 fL (ref 80.0–100.0)
Platelets: 321 K/uL (ref 150–400)
RBC: 3.3 MIL/uL — ABNORMAL LOW (ref 4.22–5.81)
RDW: 13.6 % (ref 11.5–15.5)
WBC: 9.9 K/uL (ref 4.0–10.5)
nRBC: 0 % (ref 0.0–0.2)

## 2023-12-12 LAB — CULTURE, BLOOD (ROUTINE X 2)

## 2023-12-12 MED ORDER — CARBIDOPA-LEVODOPA ER 50-200 MG PO TBCR
1.0000 | EXTENDED_RELEASE_TABLET | Freq: Every day | ORAL | Status: DC
  Administered 2023-12-12: 1 via ORAL
  Filled 2023-12-12 (×2): qty 1

## 2023-12-12 MED ORDER — ACETAMINOPHEN 650 MG RE SUPP: 650.0000 mg | Freq: Four times a day (QID) | RECTAL | Status: DC | PRN

## 2023-12-12 MED ORDER — ONDANSETRON HCL 4 MG/2ML IJ SOLN: 4.0000 mg | Freq: Four times a day (QID) | INTRAMUSCULAR | Status: DC | PRN

## 2023-12-12 MED ORDER — ACETAMINOPHEN 325 MG PO TABS: 650.0000 mg | ORAL_TABLET | Freq: Four times a day (QID) | ORAL | Status: DC | PRN

## 2023-12-12 MED ORDER — LACTATED RINGERS IV SOLN
150.0000 mL/h | INTRAVENOUS | Status: AC
  Administered 2023-12-12 (×2): 150 mL/h via INTRAVENOUS

## 2023-12-12 MED ORDER — ONDANSETRON HCL 4 MG PO TABS: 4.0000 mg | ORAL_TABLET | Freq: Four times a day (QID) | ORAL | Status: DC | PRN

## 2023-12-12 MED ORDER — CARBIDOPA-LEVODOPA 25-100 MG PO TABS
2.0000 | ORAL_TABLET | Freq: Three times a day (TID) | ORAL | Status: DC
  Administered 2023-12-12 – 2023-12-13 (×4): 2 via ORAL
  Filled 2023-12-12 (×4): qty 2

## 2023-12-12 MED ORDER — PIPERACILLIN-TAZOBACTAM 3.375 G IVPB
3.3750 g | Freq: Three times a day (TID) | INTRAVENOUS | Status: DC
  Administered 2023-12-12 – 2023-12-13 (×5): 3.375 g via INTRAVENOUS
  Filled 2023-12-12 (×5): qty 50

## 2023-12-12 MED ORDER — LACTATED RINGERS IV SOLN: INTRAVENOUS | Status: AC

## 2023-12-12 MED ORDER — ACETAMINOPHEN 500 MG PO TABS: 1000.0000 mg | ORAL_TABLET | Freq: Once | ORAL | Status: DC

## 2023-12-12 MED ORDER — VANCOMYCIN HCL 750 MG/150ML IV SOLN
750.0000 mg | INTRAVENOUS | Status: DC
  Administered 2023-12-12: 750 mg via INTRAVENOUS
  Filled 2023-12-12: qty 150

## 2023-12-12 MED ORDER — HYDROCODONE-ACETAMINOPHEN 5-325 MG PO TABS
1.0000 | ORAL_TABLET | ORAL | Status: DC | PRN
  Administered 2023-12-13: 1 via ORAL
  Filled 2023-12-12: qty 1

## 2023-12-12 NOTE — Assessment & Plan Note (Signed)
-

## 2023-12-12 NOTE — Assessment & Plan Note (Signed)
 Infected ulcer left foot Sepsis criteria include fever, tachycardia, soft blood pressures, leukocytosis with lactic acidosis and AKI Continue vancomycin and Zosyn Sepsis fluids Podiatry consulted

## 2023-12-12 NOTE — Plan of Care (Signed)
     Referral received for Daniel [name] re: goals of care discussion. Chart reviewed. Patient assessed and prefers to speak with PMT alongside his wife. Wife not at bedside during my visit.   I was able to speak with patient's wife Daniel [name] over the phone. GOC meeting scheduled for today @ 1330. Wife is aware that we will meet at patient's bedside.   Detailed note and recommendations to follow once GOC has been completed.   Thank you for your referral and allowing PMT to assist in Daniel [name]'s care.   Lamarr Gunner, FNP-BC Palliative Medicine Team   NO CHARGE

## 2023-12-12 NOTE — Final Progress Note (Signed)
 Same-day rounding progress note  Patient seen and examined.  I agree with assessment and plan dictated by Dr. Cleatus.  Please see her H&P for further details.  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 Boby and palliative care team  Plan for managing with antibiotics.  At discharge home with hospice  Time spent 35 minutes

## 2023-12-12 NOTE — Assessment & Plan Note (Signed)
 Creatinine 1.71 with baseline 0.69 a month prior Secondary to sepsis Expecting improvement with IV fluid resuscitation Continue to monitor and avoid nephrotoxins

## 2023-12-12 NOTE — ED Notes (Signed)
 Pt in and out cathed at this time, clear dark yellow output, specimen sent to lab.

## 2023-12-12 NOTE — IPAL (Signed)
  Interdisciplinary Goals of Care Family Meeting   Date carried out: 12/12/2023  Location of the meeting: Phone conference  Member's involved: Physician and Family Member or next of kin  Durable Power of Attorney or environmental health practitioner: Wife  Discussion: We discussed goals of care for Daniel [name]   Code status:   Code Status: Limited: Do not attempt resuscitation (DNR) -DNR-LIMITED -Do Not Intubate/DNI    Disposition: Home with Hospice  Time spent for the meeting: 35 minutes    Cresencio Fairly, MD  12/12/2023, 5:41 PM

## 2023-12-12 NOTE — Assessment & Plan Note (Addendum)
 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

## 2023-12-12 NOTE — H&P (Addendum)
 History and Physical    Patient: Daniel [name] FMW:[id number] DOB: 1940/06/24 DOA: 12/11/2023 DOS: the patient was seen and examined on 12/12/2023 PCP: Daniel [name] FALCON, MD  Patient coming from: Home  Chief Complaint:  Chief Complaint  Patient presents with   Weakness   Wound Infection    HPI: Daniel [name] is a 83 y.o. male with medical history significant for permanent Atrial Fibrillation, SSS s/p pacemaker, Parkinson's Disease, frailty, non-ambulant, chronic ulceration left second toe, last seen by podiatry in August 2025 when it was debrided, being admitted with sepsis secondary to infection of the left toes, not responding to outpatient antibiotic therapy (Augmentin 10/31, followed by Bactrim  11/14).  Over the past few days patient has been weaker than baseline, with low blood sugars, tachycardia and a temp of 101.8. Upon arrival in the ED, temp 99.8 and tachycardic to 135.  BP initially 141/76, getting soft to 100/68. Labs notable for WBC 12,000 with lactic acid 2.7--2.0. Urinalysis consistent with UTI and respiratory viral panel negative.  Hemoglobin 11.4 down from baseline of 13.4 months ago Creatinine 1.71 with baseline 0.69 a month prior EKG showed A-fib at 126 Chest x-ray clear and without infiltrate X-ray of the foot negative  Patient treated with NS fluid boluses and started on Zosyn  and vancomycin   Admission requested    Past Medical History:  Diagnosis Date   AF (atrial fibrillation) (HCC)    B12 deficiency    Coronary artery disease    DH (dermatitis herpetiformis)    Dysphagia    Hypertension    Presence of permanent cardiac pacemaker    2006   Primary parkinsonism [facility])    Prostate cancer (HCC) 2014   Past Surgical History:  Procedure Laterality Date   APPENDECTOMY     CARDIAC CATHETERIZATION     CATARACT EXTRACTION W/PHACO Left 05/04/2020   Procedure: CATARACT EXTRACTION PHACO AND INTRAOCULAR LENS PLACEMENT (IOC) LEFT  6.76 00:59.9 11.3%;  Surgeon:  Mittie Gaskin, MD;  Location: [facility];  Service: Ophthalmology;  Laterality: Left;   CATARACT EXTRACTION W/PHACO Right 05/18/2020   Procedure: CATARACT EXTRACTION PHACO AND INTRAOCULAR LENS PLACEMENT (IOC) RIGHT 15.56 01:52.6 13.8%;  Surgeon: Mittie Gaskin, MD;  Location: [facility];  Service: Ophthalmology;  Laterality: Right;   COLONOSCOPY  2008, 2014   CYSTOSCOPY WITH LITHOLAPAXY N/A 09/17/2019   Procedure: CYSTOSCOPY WITH LITHOLAPAXY;  Surgeon: Kassie Ozell SAUNDERS, MD;  Location: [facility] ORS;  Service: Urology;  Laterality: N/A;   ESOPHAGOGASTRODUODENOSCOPY (EGD) WITH PROPOFOL  N/A 10/13/2015   Procedure: ESOPHAGOGASTRODUODENOSCOPY (EGD) WITH PROPOFOL ;  Surgeon: Gladis RAYMOND Mariner, MD;  Location: [facility];  Service: Endoscopy;  Laterality: N/A;   FASCIOTOMY Left 01/30/2019   Procedure: I&D BELOW FASCIA FOOT SINGLE LEFT;  Surgeon: Ashley Soulier, DPM;  Location: [facility] OR[facility];  Service: Podiatry;  Laterality: Left;   GREEN LIGHT LASER TURP (TRANSURETHRAL RESECTION OF PROSTATE N/A 09/17/2019   Procedure: GREEN LIGHT LASER TURP (TRANSURETHRAL RESECTION OF PROSTATE;  Surgeon: Kassie Ozell SAUNDERS, MD;  Location: [facility] OR[facility];  Service: Urology;  Laterality: N/A;   HIP ARTHROPLASTY Left 04/22/2023   Procedure: HEMIARTHROPLASTY (BIPOLAR) HIP, POSTERIOR APPROACH FOR FRACTURE;  Surgeon: Edie Norleen PARAS, MD;  Location: [facility] OR[facility];  Service: Orthopedics;  Laterality: Left;   PACEMAKER PLACEMENT  2006   Social History:  reports that he has never smoked. He has never used smokeless tobacco. He reports that he does not currently use alcohol . He reports that he does not use drugs.  Allergies  Allergen Reactions  Clindamycin /Lincomycin Hives   Clindamycin  Hives   Doxycycline Rash   Hydrocodone -Acetaminophen  Rash   Levaquin [Levofloxacin] Rash    Family History  Problem Relation Age of Onset   Heart attack Mother    Heart attack Father     Prior to Admission medications   Medication  Sig Start Date End Date Taking? Authorizing Provider  acetaminophen  (TYLENOL ) 325 MG tablet Take 1-2 tablets (325-650 mg total) by mouth every 6 (six) hours as needed for mild pain (pain score 1-3) (or temp > 100.5). 04/26/23   Alexander, Natalie, DO  carbidopa -levodopa  (SINEMET  CR) 50-200 MG tablet Take 1 tablet by mouth at bedtime. 08/07/19   [provider]  carbidopa -levodopa  (SINEMET  IR) 25-100 MG tablet Take 2 tablets by mouth with breakfast, with lunch, and with evening meal. 04/26/23   Marsa Edelman, DO    Physical Exam: Vitals:   12/11/23 2145 12/12/23 0000 12/12/23 0159 12/12/23 0235  BP:  100/68  (!) 102/48  Pulse:  (!) 114  70  Resp:  20  18  Temp:   99.4 F (37.4 C) 98.2 F (36.8 C)  TempSrc:   Axillary   SpO2:  98%  97%  Weight: 64.1 kg     Height: 5' 10 (1.778 m)      Physical Exam Vitals and nursing note reviewed.  Constitutional:      General: He is not in acute distress.    Comments: Frail-appearing elderly male, lying on right side in fetal position  HENT:     Head: Normocephalic and atraumatic.  Cardiovascular:     Rate and Rhythm: Normal rate and regular rhythm.     Heart sounds: Normal heart sounds.  Pulmonary:     Effort: Pulmonary effort is normal.     Breath sounds: Normal breath sounds.  Abdominal:     Palpations: Abdomen is soft.     Tenderness: There is no abdominal tenderness.  Musculoskeletal:     Comments: See photos  Skin:    Comments: Pressure ulcers on lower extremities-see photos in media file        Labs on Admission: I have personally reviewed following labs and imaging studies  CBC: Recent Labs  Lab 12/11/23 2153  WBC 12.2*  NEUTROABS 10.9*  HGB 11.4*  HCT 34.4*  MCV 88.9  PLT 375   Basic Metabolic Panel: Recent Labs  Lab 12/11/23 2153  NA 136  K 4.9  CL 100  CO2 23  GLUCOSE 124*  BUN 35*  CREATININE 1.71*  CALCIUM 9.5   GFR: Estimated Creatinine Clearance: 30.2 mL/min (A) (by C-G formula based on  SCr of 1.71 mg/dL (H)). Liver Function Tests: Recent Labs  Lab 12/11/23 2153  AST 32  ALT <5  ALKPHOS 128*  BILITOT 0.7  PROT 7.7  ALBUMIN 3.7   No results for input(s): LIPASE, AMYLASE in the last 168 hours. No results for input(s): AMMONIA in the last 168 hours. Coagulation Profile: Recent Labs  Lab 12/11/23 2153  INR 1.1   Cardiac Enzymes: No results for input(s): CKTOTAL, CKMB, CKMBINDEX, TROPONINI in the last 168 hours. BNP (last 3 results) No results for input(s): PROBNP in the last 8760 hours. HbA1C: No results for input(s): HGBA1C in the last 72 hours. CBG: No results for input(s): GLUCAP in the last 168 hours. Lipid Profile: No results for input(s): CHOL, HDL, LDLCALC, TRIG, CHOLHDL, LDLDIRECT in the last 72 hours. Thyroid  Function Tests: No results for input(s): TSH, T4TOTAL, FREET4, T3FREE, THYROIDAB in the last 72  hours. Anemia Panel: No results for input(s): VITAMINB12, FOLATE, FERRITIN, TIBC, IRON, RETICCTPCT in the last 72 hours. Urine analysis:    Component Value Date/Time   COLORURINE YELLOW (A) 12/12/2023 0038   APPEARANCEUR HAZY (A) 12/12/2023 0038   LABSPEC 1.017 12/12/2023 0038   PHURINE 5.0 12/12/2023 0038   GLUCOSEU NEGATIVE 12/12/2023 0038   HGBUR LARGE (A) 12/12/2023 0038   BILIRUBINUR NEGATIVE 12/12/2023 0038   KETONESUR NEGATIVE 12/12/2023 0038   PROTEINUR NEGATIVE 12/12/2023 0038   NITRITE NEGATIVE 12/12/2023 0038   LEUKOCYTESUR SMALL (A) 12/12/2023 0038    Radiological Exams on Admission: DG Foot Complete Left Result Date: 12/11/2023 CLINICAL DATA:  Questionable sepsis EXAM: LEFT FOOT - COMPLETE 3+ VIEW COMPARISON:  None Available. FINDINGS: There is no evidence of fracture or dislocation. There is a small plantar and posterior calcaneal spur. Soft tissues are unremarkable. IMPRESSION: Negative. Electronically Signed   By: Greig Pique M.D.   On: 12/11/2023 23:45   DG Chest Port  1 View Result Date: 12/11/2023 EXAM: 1 VIEW(S) XRAY OF THE CHEST 12/11/2023 10:31:00 PM COMPARISON: 10/13/2023 CLINICAL HISTORY: Questionable sepsis - evaluate for abnormality FINDINGS: LINES, TUBES AND DEVICES: Left chest wall pacemaker in place. LUNGS AND PLEURA: Low lung volumes. No focal pulmonary opacity. No pleural effusion. No pneumothorax. HEART AND MEDIASTINUM: Aortic atherosclerotic calcification. BONES AND SOFT TISSUES: No acute osseous abnormality. UPPER ABDOMEN: Gaseous bowel distention in upper abdomen. IMPRESSION: 1. No acute findings. Electronically signed by: Dorethia Molt MD 12/11/2023 10:56 PM EST RP Workstation: [id number]   Data Reviewed for HPI: Relevant notes from primary care and specialist visits, past discharge summaries as available in EHR, including Care Everywhere. Prior diagnostic testing as pertinent to current admission diagnoses Updated medications and problem lists for reconciliation ED course, including vitals, labs, imaging, treatment and response to treatment Triage notes, nursing and pharmacy notes and ED provider's notes Notable results as noted above in HPI      Assessment and Plan: * Severe sepsis (HCC) Infected ulcer left foot Sepsis criteria include fever, tachycardia, soft blood pressures, leukocytosis with lactic acidosis and AKI Continue vancomycin and Zosyn Sepsis fluids Podiatry consulted  AKI (acute kidney injury) Creatinine 1.71 with baseline 0.69 a month prior Secondary to sepsis Expecting improvement with IV fluid resuscitation Continue to monitor and avoid nephrotoxins  Atrial fibrillation with rapid ventricular response (HCC) Heart rate in the 130s, likely driven by sepsis Expecting improvement with management of sepsis  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 Follow-up  Advance Care Planning:   Code Status: Prior   Family Communication: none  Disposition Plan: Back to previous home environment  Severity of Illness: The appropriate patient status for this patient is INPATIENT. Inpatient status is judged to be reasonable and necessary in order to provide the required intensity of service to ensure the patient's safety. The patient's presenting symptoms, physical exam findings, and initial radiographic and laboratory data in the context of their chronic comorbidities is felt to place them at high risk for further clinical deterioration. Furthermore, it is not anticipated that the patient will be medically stable for discharge from the hospital within 2 midnights of admission.   * I certify that at the point of admission  it is my clinical judgment that the patient will require inpatient hospital care spanning beyond 2 midnights from the point of admission due to high intensity of service, high risk for further deterioration and high frequency of surveillance required.*  Author: Delayne LULLA Solian, MD 12/12/2023 2:44 AM  For on call review [email or web address].

## 2023-12-12 NOTE — Consult Note (Signed)
 ORTHOPAEDIC CONSULTATION  REQUESTING PHYSICIAN: Maree Hue, MD  Chief Complaint: Gangrene of toe of left foot  HPI: Daniel [name] is a 83 y.o. male who complains of worsening gangrenous changes of his great toe and second toe of his left foot.  His wife is at bedside providing most of the review of systems and history.  He is a patient of mine up seen him in the outpatient clinic in the past with a lateral fifth metatarsal ulceration.  He had been mostly nonweightbearing due to decline in state.  Apparently admitted with concern for sepsis due to gangrenous changes to the left great toe and second toe.  His wife mentions he had been under care of hospice but they are not sure that they want to continue under hospice care at this time.  Past Medical History:  Diagnosis Date   AF (atrial fibrillation) (HCC)    B12 deficiency    Coronary artery disease    DH (dermatitis herpetiformis)    Dysphagia    Hypertension    Presence of permanent cardiac pacemaker    2006   Primary parkinsonism [facility])    Prostate cancer (HCC) 2014   Past Surgical History:  Procedure Laterality Date   APPENDECTOMY     CARDIAC CATHETERIZATION     CATARACT EXTRACTION W/PHACO Left 05/04/2020   Procedure: CATARACT EXTRACTION PHACO AND INTRAOCULAR LENS PLACEMENT (IOC) LEFT  6.76 00:59.9 11.3%;  Surgeon: Mittie Gaskin, MD;  Location: [facility];  Service: Ophthalmology;  Laterality: Left;   CATARACT EXTRACTION W/PHACO Right 05/18/2020   Procedure: CATARACT EXTRACTION PHACO AND INTRAOCULAR LENS PLACEMENT (IOC) RIGHT 15.56 01:52.6 13.8%;  Surgeon: Mittie Gaskin, MD;  Location: [facility];  Service: Ophthalmology;  Laterality: Right;   COLONOSCOPY  2008, 2014   CYSTOSCOPY WITH LITHOLAPAXY N/A 09/17/2019   Procedure: CYSTOSCOPY WITH LITHOLAPAXY;  Surgeon: Kassie Ozell SAUNDERS, MD;  Location: [facility] ORS;  Service: Urology;  Laterality: N/A;   ESOPHAGOGASTRODUODENOSCOPY (EGD) WITH PROPOFOL  N/A  10/13/2015   Procedure: ESOPHAGOGASTRODUODENOSCOPY (EGD) WITH PROPOFOL ;  Surgeon: Gladis RAYMOND Mariner, MD;  Location: [facility] ENDOSCOPY;  Service: Endoscopy;  Laterality: N/A;   FASCIOTOMY Left 01/30/2019   Procedure: I&D BELOW FASCIA FOOT SINGLE LEFT;  Surgeon: Ashley Soulier, DPM;  Location: [facility] ORS;  Service: Podiatry;  Laterality: Left;   GREEN LIGHT LASER TURP (TRANSURETHRAL RESECTION OF PROSTATE N/A 09/17/2019   Procedure: GREEN LIGHT LASER TURP (TRANSURETHRAL RESECTION OF PROSTATE;  Surgeon: Kassie Ozell SAUNDERS, MD;  Location: [facility] OR[facility];  Service: Urology;  Laterality: N/A;   HIP ARTHROPLASTY Left 04/22/2023   Procedure: HEMIARTHROPLASTY (BIPOLAR) HIP, POSTERIOR APPROACH FOR FRACTURE;  Surgeon: Edie Norleen PARAS, MD;  Location: [facility] OR[facility];  Service: Orthopedics;  Laterality: Left;   PACEMAKER PLACEMENT  2006   Social History   Socioeconomic History   Marital status: Married    Spouse name: Not on file   Number of children: Not on file   Years of education: Not on file   Highest education level: Not on file  Occupational History   Not on file  Tobacco Use   Smoking status: Never   Smokeless tobacco: Never  Vaping Use   Vaping status: Never Used  Substance and Sexual Activity   Alcohol  use: Not Currently    Comment: RARELY   Drug use: No   Sexual activity: Not on file  Other Topics Concern   Not on file  Social History Narrative   Not on file   Social Drivers of Health  Financial Resource Strain: Low Risk  (08/02/2023)   Received from [facility]   Overall Financial Resource Strain (CARDIA)    Difficulty of Paying Living Expenses: Not very hard  Food Insecurity: Patient Unable To Answer (12/12/2023)   Hunger Vital Sign    Worried About Running Out of Food in the Last Year: Patient unable to answer    Ran Out of Food in the Last Year: Patient unable to answer  Transportation Needs: Patient Unable To Answer (12/12/2023)   PRAPARE - Transportation    Lack of  Transportation (Medical): Patient unable to answer    Lack of Transportation (Non-Medical): Patient unable to answer  Physical Activity: Not on file  Stress: Not on file  Social Connections: Patient Unable To Answer (12/12/2023)   Social Connection and Isolation Panel    Frequency of Communication with Friends and Family: Patient unable to answer    Frequency of Social Gatherings with Friends and Family: Patient unable to answer    Attends Religious Services: Patient unable to answer    Active Member of Clubs or Organizations: Patient unable to answer    Attends Banker Meetings: Patient unable to answer    Marital Status: Patient unable to answer   Family History  Problem Relation Age of Onset   Heart attack Mother    Heart attack Father    Allergies  Allergen Reactions   Clindamycin /Lincomycin Hives   Clindamycin  Hives   Doxycycline Rash   Hydrocodone -Acetaminophen  Rash   Levaquin [Levofloxacin] Rash   Prior to Admission medications   Medication Sig Start Date End Date Taking? Authorizing Provider  acetaminophen  (TYLENOL ) 325 MG tablet Take 1-2 tablets (325-650 mg total) by mouth every 6 (six) hours as needed for mild pain (pain score 1-3) (or temp > 100.5). 04/26/23  Yes Alexander, Natalie, DO  carbidopa -levodopa  (SINEMET  IR) 25-100 MG tablet Take 2 tablets by mouth with breakfast, with lunch, and with evening meal. 04/26/23  Yes Marsa Edelman, DO  carbidopa -levodopa  (SINEMET  CR) 50-200 MG tablet Take 1 tablet by mouth at bedtime. Patient not taking: Reported on 12/12/2023 08/07/19   [provider]   DG Foot Complete Left Result Date: 12/11/2023 CLINICAL DATA:  Questionable sepsis EXAM: LEFT FOOT - COMPLETE 3+ VIEW COMPARISON:  None Available. FINDINGS: There is no evidence of fracture or dislocation. There is a small plantar and posterior calcaneal spur. Soft tissues are unremarkable. IMPRESSION: Negative. Electronically Signed   By: Greig Pique M.D.    On: 12/11/2023 23:45   DG Chest Port 1 View Result Date: 12/11/2023 EXAM: 1 VIEW(S) XRAY OF THE CHEST 12/11/2023 10:31:00 PM COMPARISON: 10/13/2023 CLINICAL HISTORY: Questionable sepsis - evaluate for abnormality FINDINGS: LINES, TUBES AND DEVICES: Left chest wall pacemaker in place. LUNGS AND PLEURA: Low lung volumes. No focal pulmonary opacity. No pleural effusion. No pneumothorax. HEART AND MEDIASTINUM: Aortic atherosclerotic calcification. BONES AND SOFT TISSUES: No acute osseous abnormality. UPPER ABDOMEN: Gaseous bowel distention in upper abdomen. IMPRESSION: 1. No acute findings. Electronically signed by: Dorethia Molt MD 12/11/2023 10:56 PM EST RP Workstation: [id number]    Positive ROS: All other systems have been reviewed and were otherwise negative with the exception of those mentioned in the HPI and as above.  12 point ROS was performed.  Physical Exam: General: Alert and oriented.  No apparent distress.  Vascular:  Left foot:Dorsalis Pedis:  absent Posterior Tibial:  absent  Right foot: Dorsalis Pedis:  absent Posterior Tibial:  absent  Neuro:intact gross sensation  Derm:  Multiple areas of ulcerations as described in previous notes but most concerning at this time gangrenous changes to the left great toe and second toe.  No proximal lymphangitic streaking.  This is dry.  No active purulent drainage.  Ortho/MS: He is sitting in a contracted position in bed.  He has noted contracture of the lesser toes at this time.       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  There is no active infection from the wounds at this point.  Most review of systems and history was provided by his wife.  He is fairly ill-appearing at this time.  From a limb salvage standpoint he would need vascular  intervention prior to consideration for digital amputation.  I discussed this with his wife she states she is quite concerned with undergoing amputation of the toes.  She did state that hospice care is come to speak to her this afternoon.  If the patient and family wish to undergo consideration for aggressive intervention initially he would need vascular surgery and then podiatry could perform amputation afterwards.  He would need medical and likely cardiac clearance to undergo surgery from my standpoint.  For now we will hold on intervention until decisions can be made in regards to plan of care.    Ashley Eva LABOR, DPM Cell [phone]   12/12/2023 1:32 PM

## 2023-12-12 NOTE — Plan of Care (Signed)
   Problem: Education: Goal: Knowledge of General Education information will improve Description Including pain rating scale, medication(s)/side effects and non-pharmacologic comfort measures Outcome: Progressing   Problem: Health Behavior/Discharge Planning: Goal: Ability to manage health-related needs will improve Outcome: Progressing

## 2023-12-12 NOTE — Progress Notes (Signed)
 Pharmacy Antibiotic Note  Daniel [name] is a 83 y.o. male admitted on 12/11/2023 with sepsis.  Pharmacy has been consulted for Vancomycin, Zosyn dosing.  Plan: Zosyn 3.375 gm IV X 1 given over 30 min in ED on 11/19 @ 2221. Zosyn 3.375 gm IV Q8H EI ordered to start on 11/20 @ 0400.  Vancomycin 1500 mg IV X 1 given on 11/20 @ 0011. Vancomycin 750 mg IV Q24H ordered to start on 11/21 @ 0000.  AUC = 493.3 Vanc trough = 13.9   Height: 5' 10 (177.8 cm) Weight: 64.1 kg (141 lb 5 oz) IBW/kg (Calculated) : 73  Temp (24hrs), Avg:99.1 F (37.3 C), Min:98.2 F (36.8 C), Max:99.8 F (37.7 C)  Recent Labs  Lab 12/11/23 2153 12/12/23 0015  WBC 12.2*  --   CREATININE 1.71*  --   LATICACIDVEN 2.7* 2.0*    Estimated Creatinine Clearance: 30.2 mL/min (A) (by C-G formula based on SCr of 1.71 mg/dL (H)).    Allergies  Allergen Reactions   Clindamycin /Lincomycin Hives   Clindamycin  Hives   Doxycycline Rash   Hydrocodone -Acetaminophen  Rash   Levaquin [Levofloxacin] Rash    Antimicrobials this admission:   >>    >>   Dose adjustments this admission:   Microbiology results:  BCx:   UCx:    Sputum:    MRSA PCR:   Thank you for allowing pharmacy to be a part of this patient's care.  Winner Valeriano D 12/12/2023 3:02 AM

## 2023-12-12 NOTE — Consult Note (Addendum)
 WOC Nurse Consult Note: Reason for Consult: Requested to assess wounds on sacrum, heels and toes. Wound type: Gangrene on left great toe and second left toe with secondary infection after debridement in Aug/25. Measurement: completely mummified second toe, top of great toe. (see nursing flowsheet) Wound bed: 100% black. Drainage (amount, consistency, odor) NONE Periwound: no edema, intact. Dressing procedure/placement/frequency: Paint with betadine  daily, allow to dry. Leave open air. The injury is beyond of our scope of practice. Please consult a podiatrist or a vascular.  Left heel Wound type: DTI  Pressure Injury POA: Yes Measurement: aprox. 2 cm x 1.6 cm Wound bed: 100% black eschar. Drainage (amount, consistency, odor) none. Periwound: intact, no edema or redness. Dressing procedure/placement/frequency: Cleanse with saline, pat dry. Paint with betadine  daily, allow to dry. Top with foam dressing. Change the foam every 3 days or PRN. Offload with Prevalon Boot.  Right heel Wound type: DTI, callus. Pressure Injury POA: Yes Measurement: aprox. 3 cm x 2 cm Wound bed: 100% brown eschar, dry skin. Drainage (amount, consistency, odor) none. Periwound: intact, no edema or redness. Dressing procedure/placement/frequency: Cleanse with saline, pat dry. Paint with betadine  daily, allow to dry. Top with foam dressing. Change the foam every 3 days or PRN. Offload with Prevalon Boot.  Sacrum Wound type: Pressure injury stage 3 Pressure Injury POA: Yes Measurement: aprox. 2 cm x 2 cm x 0.1 cm Wound bed: 100% pale red. Drainage (amount, consistency, odor) minimum amount. Periwound: intact, redness peri-wound, partial macerated, viable edges attached. Dressing procedure/placement/frequency: Cleanse with saline, pat dry. Apply Xerofomr to the wound bed daily, top with foam dressing. The foam can be change every 3 days if not saturated or soiled.  WOC team will not plan to follow further.  Please reconsult if further assistance is needed. Thank-you,  Lela Holm MSN, RN, CNS.  (Phone [phone])

## 2023-12-12 NOTE — Assessment & Plan Note (Signed)
 No acute issues suspected

## 2023-12-12 NOTE — Care Plan (Signed)
 All wounds changed and done as ordered Family at bedside Patient turned side to side, boots on.

## 2023-12-12 NOTE — Assessment & Plan Note (Signed)
 Heart rate in the 130s, likely driven by sepsis Expecting improvement with management of sepsis

## 2023-12-12 NOTE — Consult Note (Signed)
 Consultation Note Date: 12/12/2023 at 0930  Patient Name: Daniel [name]  DOB: 1940-03-04  MRN: [id number]  Age / Sex: 83 y.o., male  PCP: Valora Lynwood FALCON, MD Referring Physician: Maree Hue, MD  HPI/Patient Profile: 83 y.o. male  with past medical history of permanent Atrial Fibrillation, SSS s/p pacemaker, Parkinson's Disease, frailty, non-ambulant, chronic ulceration left second toe, last seen by podiatry in August 2025 when it was debrided  admitted on 12/11/2023 with sepsis secondary to infection of the left toes that were nonresponsive to antibiotic therapy in the outpatient setting.  Family endorses that patient became weaker than baseline at home which prompted call to EMS and arrival to ED.  Upon arrival in the ED, temp 99.8 and tachycardic to 135.  BP initially 141/76, getting soft to 100/68. Labs notable for WBC 12,000 with lactic acid 2.7--2.0. Urinalysis consistent with UTI and respiratory viral panel negative.  Hemoglobin 11.4 down from baseline of 13.4 months ago Creatinine 1.71 with baseline 0.69 a month prior EKG showed A-fib at 126 Chest x-ray clear and without infiltrate X-ray of the foot negative   Of note, patient met with outpatient orthopedics on 10/29 and no mention of hospice was made during that visit. This visit resulted in new PT orders  related to new left flexion contracture.    Clinical Assessment and Goals of Care: Extensive chart review completed prior to meeting patient including labs, vital signs, imaging, progress notes, orders, and available advanced directive documents from current and previous encounters. I then met with patient, his wife Ole, and patient's children (from earlier relationship) son Chyrl and daughter at bedside to discuss diagnosis prognosis, GOC, EOL wishes, disposition and options.  I introduced Palliative Medicine as specialized medical care for  people living with serious illness. It focuses on providing relief from the symptoms and stress of a serious illness. The goal is to improve quality of life for both the patient and the family.  As far as functional and nutritional status wife endorses patient was able to get out of bed to the wheelchair to come to the kitchen for breakfast every morning with help with a.  However, family endorses that patient has had significant difficulty in mobility, resulting in numerous falls with calls to EMS and family to help get him off the floor.  We discussed patient's current illness and what it means in the larger context of patient's on-going co-morbidities.  Lengthy discussion on chronic, progressive, and irreversible nature of Parkinson's disease.  Reviewed that patient's current prognosis is poor given Parkinson's, extended infection due to gangrenous toe, wounds (present prior to admission) and decreased functional status.    I attempted to elicit values and goals important to the patient.  Patient stated he does not want to be a burden to his family and wants to take fluid overload off of the medication.  Patient's wife shares that she has been to do what ever she needs to do to ensure the patient gets good care.  Patient's son at bedside shared  that patient did not let hospice team know when he was in pain or that he needed help.  Patient described his father is hardheaded and unwilling to except help when needed.  Symptoms assessed.  Patient endorses 5 out of 10 pain in his bottom and his toe.  He shares that Tylenol  does not help ease the pain.  Discussed use of Norco.  Patient's wife immediately shared she did not want him to have this.  When questioned as to why she was denying Norco administration, she shares that it is an addictive medicine and she does not want him to have it.  Extended education provided on the difference tween addiction and appropriately managing patient's pain.  I highlighted  that if the patient is in pain then that needs to be addressed.  She shares that he is not in pain.  Discussed significance of excepting reality and not living in denial for patient's current medical situation.  At the end of our discussion, patient was accepting of scheduled Tylenol  with Norco for breakthrough pain.  Wife says she can support this if this is what the patient wants for himself. I am concerned that she will continue to only allow him to accept Tylenol .   Wife shares displeasure with hospice.  Therapeutic silence, active listening, and emotional support provided.  She shares she does not understand why they did not treat his toe.  She shares she noticed that they treated it with antibiotic cream and gave him some medicine but that it did not help.  Discussed that hospice is not focused on aggressive medical treatment.  However, they appear to have addressed his toe issue that but has now become gangrenous.  Again, patient's wife seems to be frustrated that hospice did nothing but discussed reality that hospice services are to allow patient to age in place.  She shares that she canceled hospice in hopes of patient receiving more treatment for his toe as well as physical therapy to help get him stronger so that she can make him more mobile.  Discussed that despite her hope, the reality is the patient has been nonambulatory for an extended mount of time and is not predicted to gain any stronger muscle function but actually anticipated to lose more of it.  Patient's wife continue to extend frustration about not treating the toe.  I encouraged her to consider the overall bigger picture of his other chronic and ongoing disease processes in addition to his gangrenous toe.  Family has just met with podiatry.  They do not wish to proceed with amputation at this time.  They are in agreement to continue with antibiotic treatments.  Discussed patient's p.o. intake.  Family shares that he was  tolerating soft/chopped diet at home.  Soft diet is in place.  Menu provided for family to order foods.  Advised to be present when feeding so the patient is sitting upright to avoid increased aspiration risk.  I discussed next steps with patient and family.  Patient shares he would like to return home with hospice services.  His wife is in agreement.  However, she shares she is reluctant to do this because she is not accepting that patient has decreased mobility.  I again reiterated that this was not his choice but rather the natural disease process of his ongoing comorbidities.  Advised RN to give scheduled Tylenol  and Norco for breakthrough pain.  I am hopeful patient's wife will let him have this as it seems appropriate to manage his  pain and patient is in agreement with this.  Attending, RN, and TOC made aware of above discussion.  Plan remains for patient to return home with hospice services tomorrow.  However, they would like to continue current regiment of antibiotics for gangrenous toe.  PMT will continue to follow and support.  I plan to follow-up with patient and family again tomorrow.  Primary Decision Maker PATIENT  Physical Exam Vitals reviewed.  Constitutional:      General: He is not in acute distress. HENT:     Head: Normocephalic.     Mouth/Throat:     Mouth: Mucous membranes are moist.  Eyes:     Pupils: Pupils are equal, round, and reactive to light.  Pulmonary:     Effort: Pulmonary effort is normal.  Abdominal:     Palpations: Abdomen is soft.  Musculoskeletal:     Comments: Generalized weakness  Skin:    General: Skin is warm and dry.     Comments: Gangrene of left toes - UTA  Neurological:     Mental Status: He is alert and oriented to person, place, and time.  Psychiatric:        Mood and Affect: Mood normal.        Behavior: Behavior normal.        Thought Content: Thought content normal.        Judgment: Judgment normal.     Palliative  Assessment/Data: 30%     Thank you for this consult. Palliative medicine will continue to follow and assist holistically.   120 minute visit includes: Detailed review of medical records (labs, imaging, vital signs), medically appropriate exam (mental status, respiratory, cardiac, skin), discussed with treatment team, counseling and educating patient, family and staff, documenting clinical information, medication management and coordination of care.  Signed by: Lamarr Gunner, DNP, FNP-BC Palliative Medicine   Please contact Palliative Medicine Team providers via [facility] for questions and concerns.

## 2023-12-12 NOTE — Plan of Care (Signed)
  Problem: Elimination: Goal: Will not experience complications related to bowel motility Outcome: Progressing Goal: Will not experience complications related to urinary retention Outcome: Progressing   Problem: Fluid Volume: Goal: Hemodynamic stability will improve Outcome: Progressing

## 2023-12-13 ENCOUNTER — Other Ambulatory Visit: Payer: Self-pay

## 2023-12-13 DIAGNOSIS — I96 Gangrene, not elsewhere classified: Secondary | ICD-10-CM

## 2023-12-13 DIAGNOSIS — N179 Acute kidney failure, unspecified: Secondary | ICD-10-CM

## 2023-12-13 DIAGNOSIS — Z515 Encounter for palliative care: Secondary | ICD-10-CM | POA: Diagnosis not present

## 2023-12-13 DIAGNOSIS — Z950 Presence of cardiac pacemaker: Secondary | ICD-10-CM

## 2023-12-13 DIAGNOSIS — A419 Sepsis, unspecified organism: Secondary | ICD-10-CM | POA: Diagnosis not present

## 2023-12-13 DIAGNOSIS — G20A1 Parkinson's disease without dyskinesia, without mention of fluctuations: Secondary | ICD-10-CM | POA: Diagnosis not present

## 2023-12-13 LAB — BASIC METABOLIC PANEL WITH GFR
Anion gap: 8 (ref 5–15)
BUN: 24 mg/dL — ABNORMAL HIGH (ref 8–23)
CO2: 23 mmol/L (ref 22–32)
Calcium: 8.7 mg/dL — ABNORMAL LOW (ref 8.9–10.3)
Chloride: 107 mmol/L (ref 98–111)
Creatinine, Ser: 1.07 mg/dL (ref 0.61–1.24)
GFR, Estimated: >60 mL/min (ref >60)
Glucose, Bld: 102 mg/dL — ABNORMAL HIGH (ref 70–99)
Potassium: 3.8 mmol/L (ref 3.5–5.1)
Sodium: 138 mmol/L (ref 135–145)

## 2023-12-13 LAB — CBC
HCT: 29.1 % — ABNORMAL LOW (ref 39.0–52.0)
Hemoglobin: 9.3 g/dL — ABNORMAL LOW (ref 13.0–17.0)
MCH: 29 pg (ref 26.0–34.0)
MCHC: 32 g/dL (ref 30.0–36.0)
MCV: 90.7 fL (ref 80.0–100.0)
Platelets: 283 K/uL (ref 150–400)
RBC: 3.21 MIL/uL — ABNORMAL LOW (ref 4.22–5.81)
RDW: 13.5 % (ref 11.5–15.5)
WBC: 5.8 K/uL (ref 4.0–10.5)
nRBC: 0 % (ref 0.0–0.2)

## 2023-12-13 LAB — URINE CULTURE: Culture: NO GROWTH

## 2023-12-13 MED ORDER — VANCOMYCIN HCL 1250 MG/250ML IV SOLN
1250.0000 mg | INTRAVENOUS | Status: DC
  Filled 2023-12-13: qty 250

## 2023-12-13 MED ORDER — ENSURE PLUS HIGH PROTEIN PO LIQD
237.0000 mL | Freq: Two times a day (BID) | ORAL | Status: DC
  Administered 2023-12-13 (×2): 237 mL via ORAL

## 2023-12-13 MED ORDER — CEPHALEXIN 500 MG PO CAPS
500.0000 mg | ORAL_CAPSULE | Freq: Three times a day (TID) | ORAL | 0 refills | Status: AC
  Filled 2023-12-13: qty 30, 10d supply, fill #0

## 2023-12-13 MED ORDER — HYDROCODONE-ACETAMINOPHEN 5-325 MG PO TABS
1.0000 | ORAL_TABLET | Freq: Four times a day (QID) | ORAL | 0 refills | Status: AC | PRN
  Filled 2023-12-13: qty 12, 3d supply, fill #0

## 2023-12-13 NOTE — Plan of Care (Signed)
 ?  Problem: Clinical Measurements: ?Goal: Diagnostic test results will improve ?Outcome: Progressing ?  ?Problem: Nutrition: ?Goal: Adequate nutrition will be maintained ?Outcome: Progressing ?  ?Problem: Coping: ?Goal: Level of anxiety will decrease ?Outcome: Progressing ?  ?Problem: Safety: ?Goal: Ability to remain free from injury will improve ?Outcome: Progressing ?  ?

## 2023-12-13 NOTE — Plan of Care (Signed)
  Problem: Education: Goal: Knowledge of General Education information will improve Description: Including pain rating scale, medication(s)/side effects and non-pharmacologic comfort measures Outcome: Progressing   Problem: Health Behavior/Discharge Planning: Goal: Ability to manage health-related needs will improve Outcome: Progressing   Problem: Clinical Measurements: Goal: Ability to maintain clinical measurements within normal limits will improve Outcome: Progressing Goal: Will remain free from infection Outcome: Progressing Goal: Diagnostic test results will improve Outcome: Progressing Goal: Respiratory complications will improve Outcome: Not Applicable Goal: Cardiovascular complication will be avoided Outcome: Progressing   Problem: Activity: Goal: Risk for activity intolerance will decrease Outcome: Progressing   Problem: Nutrition: Goal: Adequate nutrition will be maintained Outcome: Progressing   Problem: Coping: Goal: Level of anxiety will decrease Outcome: Progressing   Problem: Elimination: Goal: Will not experience complications related to bowel motility Outcome: Progressing Goal: Will not experience complications related to urinary retention Outcome: Progressing   Problem: Pain Managment: Goal: General experience of comfort will improve and/or be controlled Outcome: Progressing   Problem: Safety: Goal: Ability to remain free from injury will improve Outcome: Progressing   Problem: Skin Integrity: Goal: Risk for impaired skin integrity will decrease Outcome: Progressing   Problem: Fluid Volume: Goal: Hemodynamic stability will improve Outcome: Progressing

## 2023-12-13 NOTE — Progress Notes (Addendum)
 Palliative Care Progress Note, Assessment & Plan   Patient Name: Daniel [name]       Date: 12/13/2023 DOB: 05/03/1940  Age: 83 y.o. MRN#: [id number] Attending Physician: Maree Hue, MD Primary Care Physician: Valora Lynwood FALCON, MD Admit Date: 12/11/2023  Subjective: Patient is lying in bed and receiving a.m. care with full bed change in bed bath by nursing staff.  Patient's wife is present but we stepped outside of the room into the hallway for our discussion.  HPI: 83 y.o. male  with past medical history of permanent Atrial Fibrillation, SSS s/p pacemaker, Parkinson's Disease, frailty, non-ambulant, chronic ulceration left second toe, last seen by podiatry in August 2025 when it was debrided  admitted on 12/11/2023 with sepsis secondary to infection of the left toes that were nonresponsive to antibiotic therapy in the outpatient setting.  Family endorses that patient became weaker than baseline at home which prompted call to EMS and arrival to ED.   Upon arrival in the ED, temp 99.8 and tachycardic to 135.  BP initially 141/76, getting soft to 100/68. Labs notable for WBC 12,000 with lactic acid 2.7--2.0. Urinalysis consistent with UTI and respiratory viral panel negative.  Hemoglobin 11.4 down from baseline of 13.4 months ago Creatinine 1.71 with baseline 0.69 a month prior EKG showed A-fib at 126 Chest x-ray clear and without infiltrate X-ray of the foot negative    Of note, patient met with outpatient orthopedics on 10/29 and no mention of hospice was made during that visit. This visit resulted in new PT orders  related to new left flexion contracture.    Summary of counseling/coordination of care: Extensive chart review completed prior to meeting patient including labs, vital signs, imaging,  progress notes, orders, and available advanced directive documents from current and previous encounters.   After reviewing the patient's chart and assessing the patient at bedside, I spoke with patient and his wife in regards to symptom management and goals of care.   Patient is awake, alert, acknowledges my presence, and is able to make his wishes known.  He has no acute complaints at this time.  I spoke with patient's wife outside of the room in regards to next steps.  Patient's wife inquires about how the patient is doing today-specifically in regards to his lab work.  Reviewed kidney function is no longer an acute kidney injury (creatinine now 1.07), patient's white blood cell count has decreased to within normal limits (5.8). She is grateful that these numbers have improved and feels confident that it is safe for him to return home now.   She shares she is prepared to take patient home with hospice services to follow. She shares that she was upset yesterday as the reality of his new baseline is setting in. She shares she now understands he has a new normal functional status but will work within the means of keeping him safe and comfortable.  She was appreciative of our discussion yesterday and for the continued support today.  She is prepared to discharge home today if possible.  I shared that I would convey her wishes to attending.  We also discussed mobility at home.  She shares she may shift caregiver support  to different times number to help facilitate getting him in and out of the bed safely.  She understands that PT/OT is no longer beneficial for patient and that aging in place with aggressive symptom management with comfort focused care is the new goals.  I continue to recommend scheduled Tylenol  and Norco 1-2 tabs for breakthrough pain.  Given patient's gangrenous toes and sacral wounds, he is experiencing ongoing pain.  However, patient's wife is resistant to giving medication.   Additional education will hopefully continue with hospice services at home.  After visiting with patient, I counseled with attending, TOC to share that patient and family are prepared to discharge home with hospice services today.  TOC following closely for discharge planning.  No further palliative needs at this time.  Please reengage with PMT if goals change, patient/family's request, or patient's health deteriorates during hospitalization.  Physical Exam Vitals reviewed.  Constitutional:      Comments: Thin, frail  HENT:     Mouth/Throat:     Mouth: Mucous membranes are moist.  Eyes:     Pupils: Pupils are equal, round, and reactive to light.  Pulmonary:     Effort: Pulmonary effort is normal.  Skin:    General: Skin is warm and dry.  Neurological:     Mental Status: He is alert and oriented to person, place, and time.  Psychiatric:        Mood and Affect: Mood normal.        Behavior: Behavior normal.             35 minute visit includes: Detailed review of medical records (labs, imaging, vital signs), medically appropriate exam (mental status, respiratory, cardiac, skin), discussed with treatment team, counseling and educating patient, family and staff, documenting clinical information, medication management and coordination of care.  Lamarr L. Arvid, DNP, FNP-BC Palliative Medicine Team

## 2023-12-13 NOTE — Progress Notes (Signed)
 Pharmacy Antibiotic Note  Daniel [name] is a 83 y.o. male admitted on 12/11/2023 with sepsis due to infected toe.  Pharmacy has been consulted for Vancomycin , Zosyn  dosing.  -Gangrene left great toe and second toe. Severe peripheral vascular disease.  -Per MD note 11/20- Plan for managing with antibiotics.  -Per podiatry note 11/20: If the patient and family wish to undergo consideration for aggressive intervention initially he would need vascular surgery and then podiatry could perform amputation afterwards.   Plan: Scr improved 1.45>> 1.07  - continue Zosyn  3.375 gm IV Q8H EI   - Will adjust Vancomycin  from 750 mg to 1250 mg IV Q24H   Goal AUC 400-550. Expected AUC: 542 Cmin 12.6 SCr used: 1.07    Continue to assess renal fxn, cultures, length of therapy, etc   Height: 5' 10 (177.8 cm) Weight: 64.1 kg (141 lb 5 oz) IBW/kg (Calculated) : 73  Temp (24hrs), Avg:98.6 F (37 C), Min:98.4 F (36.9 C), Max:98.9 F (37.2 C)  Recent Labs  Lab 12/11/23 2153 12/12/23 0015 12/12/23 0335 12/13/23 0513  WBC 12.2*  --  9.9 5.8  CREATININE 1.71*  --  1.45* 1.07  LATICACIDVEN 2.7* 2.0*  --   --     Estimated Creatinine Clearance: 48.3 mL/min (by C-G formula based on SCr of 1.07 mg/dL).    Allergies  Allergen Reactions   Clindamycin /Lincomycin Hives   Clindamycin  Hives   Doxycycline Rash   Hydrocodone -Acetaminophen  Rash   Levaquin [Levofloxacin] Rash    Antimicrobials this admission: Vanc  11/20 >>   Zosyn  11/19 (evening)>>   Dose adjustments this admission:   Microbiology results: 11/19 BCx: NGTD  UCx:    Sputum:    MRSA PCR:   Thank you for allowing pharmacy to be a part of this patient's care.  Allean Haas PharmD Clinical Pharmacist 12/13/2023

## 2023-12-13 NOTE — Progress Notes (Signed)
 [facility] LIA[facility] NOTE  Received request from Antion Vicci, LCSW, for hospice services at home after discharge. Spoke with Mr. Hafford and his wife at the bedside to initiate education related to hospice philosophy, services, and team approach to care. Patient was a former hospice patient of [facility], who revoked services after a month on service to pursue aggressive PT.  Patient is now rehospitalized and would like to start back their hospice services at discharge.  No DME needs as patient still has equipment in the home from prior hospice admission.  Per hospital medical team, the patient plans on discharging home today.       The address has been verified and is correct in the chart.  Please send signed and completed DNR home with patient/family if applicable.   Please provide prescriptions at discharge as needed to ensure ongoing symptom management.  [facility] information and contact numbers given to Mrs. Jerrye  Above information shared with Alvaro Vicci, LCSW and hospital medical care team.  Please call with any hospice related questions or concerns.  Thank you for the opportunity to participate in this patient's care.    Saddie HILARIO Na, MA, BSN, RN, FNE Nurse Liaison [phone]

## 2023-12-14 NOTE — Discharge Summary (Signed)
 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 male with medical history significant for permanent Atrial Fibrillation, SSS s/p pacemaker, Parkinson's Disease, frailty, non-ambulant, chronic ulceration left second toe, last seen by podiatry in August 2025 when it was debrided, being admitted with sepsis secondary to infection of the left toes, not responding to outpatient antibiotic therapy (Augmentin 10/31, followed by Bactrim  11/14).  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 History of decubitus ulcers SSS s/p cardiac pacemaker No acute issues suspected   Parkinson's disease (HCC)  Patient is going home under hospice care/[facility]       Consultants: Podiatry, palliative care  Disposition:  Hospice care Diet recommendation:  Discharge Diet Orders (From admission, onward)     Start     Ordered   12/13/23 0000  Diet - low sodium heart healthy        12/13/23 1246           Regular diet DISCHARGE MEDICATION: Allergies as of 12/13/2023       Reactions   Clindamycin /lincomycin Hives   Clindamycin  Hives   Doxycycline Rash   Hydrocodone -acetaminophen  Rash   Levaquin [levofloxacin] Rash        Medication List     STOP taking these medications    acetaminophen  325 MG tablet Commonly known as: TYLENOL        TAKE these medications    carbidopa -levodopa  25-100 MG tablet Commonly known as: SINEMET  IR Take 2 tablets by mouth with breakfast, with lunch, and with evening meal. What changed: Another medication with the same name was removed. Continue taking this medication, and follow the directions you see here.   cephALEXin  500 MG capsule Commonly known as: KEFLEX  Take 1 capsule (500 mg total) by mouth 3 (three) times daily for 10 days.   HYDROcodone -acetaminophen  5-325 MG tablet Commonly known as: NORCO/VICODIN Take 1 tablet by mouth every 6 (six) hours as needed for up to 3 days for moderate pain (pain score 4-6) or severe pain (pain score 7-10).               Discharge Care Instructions  (From admission, onward)           Start     Ordered   12/13/23 0000  Discharge wound care:       Comments:  As above   12/13/23 1246            Follow-up Information     Valora Lynwood FALCON, MD. Schedule an appointment as soon as possible for a visit in 1 week(s).   Specialty: Family Medicine Why: [facility] Discharge F/UP Contact information: [address] [place] [address] [phone]                Discharge Exam: Fredricka Weights   12/11/23 2145  Weight: 64.1 kg   Constitutional:      General: He is not in acute distress.    Comments: Frail-appearing elderly male, lying on right side in fetal position   HENT:     Head: Normocephalic and atraumatic.  Cardiovascular:     Rate and Rhythm: Normal rate and regular rhythm.     Heart sounds: Normal heart sounds.  Pulmonary:     Effort: Pulmonary effort is normal.     Breath sounds: Normal breath sounds.  Abdominal:     Palpations: Abdomen is soft.     Tenderness: There is no abdominal tenderness.  Musculoskeletal:     Comments: See photos        Condition at discharge: poor  The results of significant diagnostics from this hospitalization (including imaging, microbiology, ancillary and laboratory) are listed below for reference.   Imaging Studies: DG Foot Complete Left Result Date: 12/11/2023 CLINICAL DATA:  Questionable sepsis EXAM: LEFT FOOT - COMPLETE 3+ VIEW COMPARISON:  None Available. FINDINGS: There is no evidence of fracture or dislocation. There is a small plantar and posterior calcaneal spur. Soft tissues are unremarkable. IMPRESSION: Negative. Electronically Signed   By: Greig Pique M.D.   On: 12/11/2023 23:45   DG Chest Port 1 View Result Date: 12/11/2023 EXAM: 1 VIEW(S) XRAY OF THE CHEST 12/11/2023 10:31:00 PM COMPARISON: 10/13/2023 CLINICAL HISTORY: Questionable sepsis - evaluate for abnormality FINDINGS: LINES, TUBES AND DEVICES: Left chest wall pacemaker in place. LUNGS AND PLEURA: Low lung volumes. No focal pulmonary opacity. No pleural effusion. No pneumothorax. HEART AND MEDIASTINUM: Aortic atherosclerotic calcification. BONES AND SOFT TISSUES: No acute osseous abnormality. UPPER ABDOMEN: Gaseous bowel distention in upper abdomen. IMPRESSION: 1. No acute findings. Electronically signed by: Dorethia Molt MD 12/11/2023 10:56 PM EST RP Workstation: [id number]    Microbiology: Results for orders placed or performed during the hospital encounter of 12/11/23  Resp panel by RT-PCR (RSV, Flu A&B, Covid) Anterior Nasal Swab     Status: None   Collection Time: 12/11/23  9:53 PM   Specimen: Anterior Nasal Swab  Result  Value Ref Range Status   SARS Coronavirus 2 by RT PCR NEGATIVE NEGATIVE Final    Comment: (NOTE) SARS-CoV-2 target nucleic acids are NOT DETECTED.  The SARS-CoV-2 RNA is generally detectable in upper respiratory specimens during the acute phase of infection. The lowest concentration of SARS-CoV-2 viral copies this assay can detect is 138 copies/mL. A negative result does not preclude SARS-Cov-2 infection and should not be used as the sole basis for treatment or other patient management decisions. A negative result may occur with  improper specimen collection/handling, submission of specimen other than nasopharyngeal swab, presence of viral mutation(s) within the areas targeted by this assay, and inadequate number of viral copies(<138 copies/mL). A negative result must be combined with clinical observations, patient history, and epidemiological information. The expected result is Negative.  Fact Sheet for Patients:  bloggercourse.com  Fact Sheet for Healthcare Providers:  seriousbroker.it  This test is  no t yet approved or cleared by the [place]  FDA and  has been authorized for detection and/or diagnosis of SARS-CoV-2 by FDA under an Emergency Use Authorization (EUA). This EUA will remain  in effect (meaning this test can be used) for the duration of the COVID-19 declaration under Section 564(b)(1) of the Act, 21 U.S.C.section 360bbb-3(b)(1), unless the authorization is terminated  or revoked sooner.       Influenza A by PCR NEGATIVE NEGATIVE Final   Influenza B by PCR NEGATIVE NEGATIVE Final    Comment: (NOTE) The Xpert Xpress SARS-CoV-2/FLU/RSV plus assay is intended as an aid in the diagnosis of influenza from Nasopharyngeal swab specimens and should not be used as a sole basis for treatment. Nasal washings and aspirates are unacceptable for Xpert Xpress SARS-CoV-2/FLU/RSV testing.  Fact Sheet for  Patients: bloggercourse.com  Fact Sheet for Healthcare Providers: seriousbroker.it  This test is not yet approved or cleared by the [place]  FDA and has been authorized for detection and/or diagnosis of SARS-CoV-2 by FDA under an Emergency Use Authorization (EUA). This EUA will remain in effect (meaning this test can be used) for the duration of the COVID-19 declaration under Section 564(b)(1) of the Act, 21 U.S.C. section 360bbb-3(b)(1), unless the authorization is terminated or revoked.     Resp Syncytial Virus by PCR NEGATIVE NEGATIVE Final    Comment: (NOTE) Fact Sheet for Patients: bloggercourse.com  Fact Sheet for Healthcare Providers: seriousbroker.it  This test is not yet approved or cleared by the [place]  FDA and has been authorized for detection and/or diagnosis of SARS-CoV-2 by FDA under an Emergency Use Authorization (EUA). This EUA will remain in effect (meaning this test can be used) for the duration of the COVID-19 declaration under Section 564(b)(1) of the Act, 21 U.S.C. section 360bbb-3(b)(1), unless the authorization is terminated or revoked.  Performed at [facility], [address] [place], [place] [address]   Blood Culture (routine x 2)     Status: None (Preliminary result)   Collection Time: 12/11/23  9:53 PM   Specimen: BLOOD  Result Value Ref Range Status   Specimen Description BLOOD BLOOD RIGHT ARM  Final   Special Requests   Final    BOTTLES DRAWN AEROBIC AND ANAEROBIC Blood Culture adequate volume   Culture   Final    NO GROWTH 2 DAYS Performed at [facility], [address] [place], [place] [address]    Report Status PENDING  Incomplete  Blood Culture (routine x 2)     Status: None (Preliminary result)   Collection Time: 12/11/23  9:59 PM   Specimen: BLOOD  Result Value Ref Range Status   Specimen Description  BLOOD BLOOD LEFT ARM  Final   Special Requests   Final    BOTTLES DRAWN AEROBIC AND ANAEROBIC Blood Culture results may not be optimal due to an inadequate volume of blood received in culture bottles   Culture   Final    NO GROWTH 2 DAYS Performed at [facility], [address], [place], [place] [address]    Report Status PENDING  Incomplete  Urine Culture     Status: None   Collection Time: 12/12/23 12:38 AM   Specimen: Urine, Random  Result Value Ref Range Status   Specimen Description   Final    URINE, RANDOM Performed at [facility], [address], [place], [place] [address]    Special Requests   Final    NONE Reflexed from [id number] Performed  at [facility], [address] [place], [place] [address]    Culture   Final    NO GROWTH Performed at [facility], [address], [place], [place] [address]    Report Status 12/13/2023 FINAL  Final  C Difficile Quick Screen w PCR reflex     Status: None   Collection Time: 12/12/23  5:15 PM   Specimen: STOOL  Result Value Ref Range Status   C Diff antigen NEGATIVE NEGATIVE Final   C Diff toxin NEGATIVE NEGATIVE Final   C Diff interpretation No C. difficile detected.  Final    Comment: Performed at [facility], [address] [place], [place] [address]    Labs: CBC: Recent Labs  Lab 12/11/23 2153 12/12/23 0335 12/13/23 0513  WBC 12.2* 9.9 5.8  NEUTROABS 10.9*  --   --   HGB 11.4* 9.7* 9.3*  HCT 34.4* 29.2* 29.1*  MCV 88.9 88.5 90.7  PLT 375 321 283   Basic Metabolic Panel: Recent Labs  Lab 12/11/23 2153 12/12/23 0335 12/13/23 0513  NA 136 139 138  K 4.9 4.2 3.8  CL 100 105 107  CO2 23 21* 23  GLUCOSE 124* 112* 102*  BUN 35* 31* 24*  CREATININE 1.71* 1.45* 1.07  CALCIUM 9.5 8.6* 8.7*   Liver Function Tests: Recent Labs  Lab 12/11/23 2153  AST 32  ALT <5  ALKPHOS 128*  BILITOT 0.7  PROT 7.7  ALBUMIN 3.7   CBG: No results for input(s): GLUCAP in the  last 168 hours.  Discharge time spent: greater than 30 minutes.  Signed: Cresencio Fairly, MD [facility] 12/14/2023

## 2023-12-16 DIAGNOSIS — I4891 Unspecified atrial fibrillation: Secondary | ICD-10-CM | POA: Diagnosis not present

## 2023-12-16 DIAGNOSIS — E785 Hyperlipidemia, unspecified: Secondary | ICD-10-CM | POA: Diagnosis not present

## 2023-12-16 DIAGNOSIS — I2510 Atherosclerotic heart disease of native coronary artery without angina pectoris: Secondary | ICD-10-CM | POA: Diagnosis not present

## 2023-12-16 DIAGNOSIS — G20B2 Parkinson's disease with dyskinesia, with fluctuations: Secondary | ICD-10-CM | POA: Diagnosis not present

## 2023-12-16 DIAGNOSIS — R1310 Dysphagia, unspecified: Secondary | ICD-10-CM | POA: Diagnosis not present

## 2023-12-16 DIAGNOSIS — I10 Essential (primary) hypertension: Secondary | ICD-10-CM | POA: Diagnosis not present

## 2023-12-16 DIAGNOSIS — Z950 Presence of cardiac pacemaker: Secondary | ICD-10-CM | POA: Diagnosis not present

## 2023-12-17 LAB — CULTURE, BLOOD (ROUTINE X 2)
Culture: NO GROWTH
Culture: NO GROWTH
Special Requests: ADEQUATE
